# Patient Record
Sex: Female | Born: 1935 | Race: White | Hispanic: No | Smoking: Never smoker
Health system: Southern US, Community
[De-identification: ages and names within clinical notes are randomized; demographics above are authoritative.]

## PROBLEM LIST (undated history)

## (undated) DIAGNOSIS — G2581 Restless legs syndrome: Secondary | ICD-10-CM

## (undated) DIAGNOSIS — G20 Parkinson's disease: Secondary | ICD-10-CM

## (undated) DIAGNOSIS — K219 Gastro-esophageal reflux disease without esophagitis: Secondary | ICD-10-CM

## (undated) DIAGNOSIS — E785 Hyperlipidemia, unspecified: Secondary | ICD-10-CM

## (undated) DIAGNOSIS — G20A1 Parkinson's disease without dyskinesia, without mention of fluctuations: Secondary | ICD-10-CM

## (undated) DIAGNOSIS — M1990 Unspecified osteoarthritis, unspecified site: Secondary | ICD-10-CM

## (undated) HISTORY — PX: TONSILLECTOMY: SUR1361

## (undated) HISTORY — PX: INNER EAR SURGERY: SHX679

## (undated) HISTORY — DX: Restless legs syndrome: G25.81

## (undated) HISTORY — DX: Parkinson's disease: G20

## (undated) HISTORY — DX: Parkinson's disease without dyskinesia, without mention of fluctuations: G20.A1

## (undated) HISTORY — PX: JOINT REPLACEMENT: SHX530

## (undated) HISTORY — DX: Hyperlipidemia, unspecified: E78.5

## (undated) HISTORY — PX: OTHER SURGICAL HISTORY: SHX169

## (undated) HISTORY — PX: FL INJ RT KNEE CT ARTHROGRAM (ARMC HX): HXRAD1306

---

## 2005-07-04 ENCOUNTER — Ambulatory Visit: Payer: Self-pay | Admitting: Family Medicine

## 2006-07-06 ENCOUNTER — Ambulatory Visit: Payer: Self-pay | Admitting: Family Medicine

## 2007-03-29 ENCOUNTER — Ambulatory Visit: Payer: Self-pay | Admitting: Podiatry

## 2007-08-27 ENCOUNTER — Ambulatory Visit: Payer: Self-pay | Admitting: Family Medicine

## 2007-11-18 ENCOUNTER — Ambulatory Visit: Payer: Self-pay | Admitting: General Practice

## 2007-12-02 ENCOUNTER — Inpatient Hospital Stay: Payer: Self-pay | Admitting: General Practice

## 2008-08-04 LAB — HM PAP SMEAR

## 2008-08-28 ENCOUNTER — Ambulatory Visit: Payer: Self-pay | Admitting: Family Medicine

## 2009-04-06 ENCOUNTER — Emergency Department: Payer: Self-pay | Admitting: Emergency Medicine

## 2009-10-21 ENCOUNTER — Ambulatory Visit: Payer: Self-pay | Admitting: Family Medicine

## 2010-10-25 ENCOUNTER — Ambulatory Visit: Payer: Self-pay | Admitting: Family Medicine

## 2010-12-27 ENCOUNTER — Ambulatory Visit: Payer: Self-pay | Admitting: Family Medicine

## 2011-11-29 ENCOUNTER — Ambulatory Visit: Payer: Self-pay | Admitting: Family Medicine

## 2012-09-26 ENCOUNTER — Ambulatory Visit: Payer: Self-pay | Admitting: Unknown Physician Specialty

## 2012-09-26 LAB — HM COLONOSCOPY

## 2012-11-20 ENCOUNTER — Ambulatory Visit: Payer: Self-pay | Admitting: Family Medicine

## 2012-11-28 ENCOUNTER — Ambulatory Visit: Payer: Self-pay | Admitting: Family Medicine

## 2013-10-28 ENCOUNTER — Ambulatory Visit: Payer: Self-pay | Admitting: Family Medicine

## 2013-10-28 LAB — HM MAMMOGRAPHY

## 2014-01-29 LAB — HEPATIC FUNCTION PANEL
ALT: 30 U/L (ref 7–35)
AST: 33 U/L (ref 13–35)
Alkaline Phosphatase: 76 U/L (ref 25–125)
Bilirubin, Total: 0.7 mg/dL

## 2014-01-29 LAB — BASIC METABOLIC PANEL
BUN: 16 mg/dL (ref 4–21)
CREATININE: 0.8 mg/dL (ref 0.5–1.1)
Glucose: 92 mg/dL
Potassium: 5 mmol/L (ref 3.4–5.3)
Sodium: 141 mmol/L (ref 137–147)

## 2014-01-29 LAB — CBC AND DIFFERENTIAL
HCT: 43 % (ref 36–46)
HEMOGLOBIN: 14.1 g/dL (ref 12.0–16.0)
Neutrophils Absolute: 50 /uL
PLATELETS: 205 10*3/uL (ref 150–399)
WBC: 7.2 10^3/mL

## 2014-01-29 LAB — LIPID PANEL
Cholesterol: 201 mg/dL — AB (ref 0–200)
HDL: 47 mg/dL (ref 35–70)
LDL CALC: 119 mg/dL
LDL/HDL RATIO: 2.5
TRIGLYCERIDES: 176 mg/dL — AB (ref 40–160)

## 2014-01-29 LAB — TSH: TSH: 2.07 u[IU]/mL (ref 0.41–5.90)

## 2014-03-17 DIAGNOSIS — M069 Rheumatoid arthritis, unspecified: Secondary | ICD-10-CM | POA: Insufficient documentation

## 2014-03-17 DIAGNOSIS — M810 Age-related osteoporosis without current pathological fracture: Secondary | ICD-10-CM | POA: Insufficient documentation

## 2014-05-12 DIAGNOSIS — E559 Vitamin D deficiency, unspecified: Secondary | ICD-10-CM | POA: Insufficient documentation

## 2014-05-12 DIAGNOSIS — M6530 Trigger finger, unspecified finger: Secondary | ICD-10-CM | POA: Insufficient documentation

## 2014-11-16 DIAGNOSIS — Z85828 Personal history of other malignant neoplasm of skin: Secondary | ICD-10-CM | POA: Insufficient documentation

## 2014-11-18 ENCOUNTER — Ambulatory Visit: Payer: Self-pay | Admitting: Family Medicine

## 2015-04-30 DIAGNOSIS — R5383 Other fatigue: Secondary | ICD-10-CM

## 2015-04-30 DIAGNOSIS — F4320 Adjustment disorder, unspecified: Secondary | ICD-10-CM | POA: Insufficient documentation

## 2015-04-30 DIAGNOSIS — F329 Major depressive disorder, single episode, unspecified: Secondary | ICD-10-CM | POA: Insufficient documentation

## 2015-04-30 DIAGNOSIS — M069 Rheumatoid arthritis, unspecified: Secondary | ICD-10-CM | POA: Insufficient documentation

## 2015-04-30 DIAGNOSIS — F09 Unspecified mental disorder due to known physiological condition: Secondary | ICD-10-CM | POA: Insufficient documentation

## 2015-04-30 DIAGNOSIS — R413 Other amnesia: Secondary | ICD-10-CM | POA: Insufficient documentation

## 2015-04-30 DIAGNOSIS — M1990 Unspecified osteoarthritis, unspecified site: Secondary | ICD-10-CM | POA: Insufficient documentation

## 2015-04-30 DIAGNOSIS — R5381 Other malaise: Secondary | ICD-10-CM | POA: Insufficient documentation

## 2015-04-30 DIAGNOSIS — K219 Gastro-esophageal reflux disease without esophagitis: Secondary | ICD-10-CM | POA: Insufficient documentation

## 2015-04-30 DIAGNOSIS — M810 Age-related osteoporosis without current pathological fracture: Secondary | ICD-10-CM | POA: Insufficient documentation

## 2015-04-30 DIAGNOSIS — D649 Anemia, unspecified: Secondary | ICD-10-CM | POA: Insufficient documentation

## 2015-04-30 DIAGNOSIS — B351 Tinea unguium: Secondary | ICD-10-CM | POA: Insufficient documentation

## 2015-04-30 DIAGNOSIS — E785 Hyperlipidemia, unspecified: Secondary | ICD-10-CM | POA: Insufficient documentation

## 2015-06-08 ENCOUNTER — Other Ambulatory Visit: Payer: Self-pay

## 2015-06-08 DIAGNOSIS — E7800 Pure hypercholesterolemia, unspecified: Secondary | ICD-10-CM

## 2015-06-08 MED ORDER — LOVASTATIN 20 MG PO TABS: 20.0000 mg | ORAL_TABLET | Freq: Every day | ORAL | Status: DC

## 2015-06-10 ENCOUNTER — Encounter: Payer: Self-pay | Admitting: Family Medicine

## 2015-06-10 ENCOUNTER — Ambulatory Visit (INDEPENDENT_AMBULATORY_CARE_PROVIDER_SITE_OTHER): Payer: Medicare Other | Admitting: Family Medicine

## 2015-06-10 VITALS — BP 124/62 | HR 76 | Temp 97.6°F | Resp 14 | Ht 62.0 in | Wt 153.0 lb

## 2015-06-10 DIAGNOSIS — G629 Polyneuropathy, unspecified: Secondary | ICD-10-CM

## 2015-06-10 DIAGNOSIS — M069 Rheumatoid arthritis, unspecified: Secondary | ICD-10-CM | POA: Diagnosis not present

## 2015-06-10 DIAGNOSIS — F068 Other specified mental disorders due to known physiological condition: Secondary | ICD-10-CM

## 2015-06-10 DIAGNOSIS — F320 Major depressive disorder, single episode, mild: Secondary | ICD-10-CM | POA: Diagnosis not present

## 2015-06-10 DIAGNOSIS — E785 Hyperlipidemia, unspecified: Secondary | ICD-10-CM | POA: Diagnosis not present

## 2015-06-10 DIAGNOSIS — F09 Unspecified mental disorder due to known physiological condition: Secondary | ICD-10-CM

## 2015-06-10 MED ORDER — DONEPEZIL HCL 5 MG PO TABS: 5.0000 mg | ORAL_TABLET | Freq: Every day | ORAL | Status: DC

## 2015-06-10 NOTE — Progress Notes (Signed)
Patient ID: Morgan Dennis, female   DOB: February 29, 1934, 81 y.o.   MRN: [id number]    Subjective:  Hyperlipidemia This is a chronic problem. Pertinent negatives include no chest pain, myalgias or shortness of breath.   Depression Here for follow up. Patient states she is doing stable on Zoloft.  Memory impairment Here for follow up. She is taking Vayacog daily. She thinks it has helped overall. Forgetting what she came in the room for, names sometimes. She does not drive by herself.  Prior to Admission medications   Medication Sig Start Date End Date Taking? Authorizing Provider  aspirin 81 MG tablet Take 1 tablet by mouth daily.   Yes Historical Provider, MD  Cholecalciferol (VITAMIN D3) 2000 UNITS TABS Take 1 tablet by mouth daily.   Yes Historical Provider, MD  folic acid (FOLVITE) 408 MCG tablet Take 1 tablet by mouth daily. 06/12/11  Yes Historical Provider, MD  gabapentin (NEURONTIN) 300 MG capsule Take 1 capsule by mouth daily. 06/08/15  Yes Historical Provider, MD  Hydrogen Peroxide (PROXACOL EX) Apply topically See admin instructions. As directed   Yes Historical Provider, MD  inFLIXimab (REMICADE) 100 MG injection Inject into the vein See admin instructions. Every 6 weeks   Yes Historical Provider, MD  lovastatin (MEVACOR) 20 MG tablet Take 1 tablet (20 mg total) by mouth at bedtime. 06/08/15  Yes Richard Maceo Pro., MD  methotrexate 2.5 MG tablet Take 6 tablets by mouth See admin instructions. Every 6 weeks   Yes Historical Provider, MD  MULTIPLE VITAMIN PO Take 1 tablet by mouth daily. 06/12/11  Yes Historical Provider, MD  Omega 3 1000 MG CAPS Take 1 capsule by mouth daily. 06/12/11  Yes Historical Provider, MD  Phosphatidylserine-DHA-EPA 100-19.5-6.5 MG CAPS Take 1 tablet by mouth daily. 01/14/15  Yes Historical Provider, MD  sertraline (ZOLOFT) 50 MG tablet Take 1 tablet by mouth daily. 07/14/14  Yes Historical Provider, MD  Vitamin E 400 UNITS TABS Take 1 tablet by mouth daily.  12/30/12  Yes Historical Provider, MD    Patient Active Problem List   Diagnosis Date Noted  . Adaptation reaction 04/30/2015  . Absolute anemia 04/30/2015  . Acid reflux 04/30/2015  . HLD (hyperlipidemia) 04/30/2015  . Malaise and fatigue 04/30/2015  . Affective disorder, major 04/30/2015  . Bad memory 04/30/2015  . Mild cognitive disorder 04/30/2015  . Fungal infection of toenail 04/30/2015  . Arthritis, degenerative 04/30/2015  . OP (osteoporosis) 04/30/2015  . Arthritis or polyarthritis, rheumatoid 04/30/2015  . Avitaminosis D 05/12/2014    No past medical history on file.  History   Social History  . Marital Status: Married    Spouse Name: Percell Miller  . Number of Children: 1  . Years of Education: 12   Occupational History  . retired    Social History Main Topics  . Smoking status: Never Smoker   . Smokeless tobacco: Never Used  . Alcohol Use: No  . Drug Use: No  . Sexual Activity: No   Other Topics Concern  . Not on file   Social History Narrative    Allergies  Allergen Reactions  . Evista  [Raloxifene]     Other reaction(s): Joint Pains  . Ibandronic Acid     Other reaction(s): Muscle Pain  . Penicillins     Other reaction(s): Rash  . Risedronate Sodium     Other reaction(s): Joint Pains    Review of Systems  Constitutional: Negative for fever, chills, weight loss and diaphoresis.  Respiratory: Negative for cough, hemoptysis, sputum production and shortness of breath.   Cardiovascular: Negative for chest pain, palpitations, orthopnea and claudication.  Musculoskeletal: Positive for joint pain. Negative for myalgias, back pain and neck pain.  Neurological: Negative for dizziness, tingling, tremors, weakness and headaches.  Psychiatric/Behavioral: Negative for depression, suicidal ideas and hallucinations. The patient is not nervous/anxious and does not have insomnia.     Immunization History  Administered Date(s) Administered  . Pneumococcal  Polysaccharide-23 09/14/2008, 04/06/2009  . Td 05/03/2004, 04/06/2009  . Tdap 11/15/2011   Objective:  BP 124/62 mmHg  Pulse 76  Temp(Src) 97.6 F (36.4 C)  Resp 14  Ht 5\' 2"  (1.575 m)  Wt 153 lb (69.4 kg)  BMI 27.98 kg/m2  Physical Exam  Constitutional: She is oriented to person, place, and time and well-developed, well-nourished, and in no distress. No distress.  HENT:  Head: Normocephalic.  Eyes: Conjunctivae are normal. Pupils are equal, round, and reactive to light.  Neck: Normal range of motion. Neck supple.  Cardiovascular: Normal rate, regular rhythm, normal heart sounds and intact distal pulses.   No murmur heard. Pulmonary/Chest: Effort normal and breath sounds normal. She has no wheezes. She has no rales.  Musculoskeletal: Normal range of motion. She exhibits no edema or tenderness.  Neurological: She is alert and oriented to person, place, and time. Gait normal.  Psychiatric: Affect and judgment normal.    Lab Results  Component Value Date   WBC 7.2 01/29/2014   HGB 14.1 01/29/2014   HCT 43 01/29/2014   PLT 205 01/29/2014   CHOL 201* 01/29/2014   TRIG 176* 01/29/2014   HDL 47 01/29/2014   LDLCALC 119 01/29/2014   TSH 2.07 01/29/2014    CMP     Component Value Date/Time   NA 141 01/29/2014   K 5.0 01/29/2014   BUN 16 01/29/2014   CREATININE 0.8 01/29/2014   AST 33 01/29/2014   ALT 30 01/29/2014   ALKPHOS 76 01/29/2014    Assessment and Plan :  1. HLD (hyperlipidemia) Stable. Will check labs today.  2. Major depressive disorder, single episode, mild Stable. Follow.  3. Mild cognitive disorder This is progressing towards dementia. MMSE on next visit. Maybe slightly worse. Will add Donepezil and re check MMSE on the next visit.  4. Rheumatoid arthritis Follows rheumatologist for this issue.  5. Neuropathy Will check B12 levels today also plus routine labs.   Patient was seen and examined by Dr. Eulas Post and note was scribed by  Theressa Millard, RMA.  Miguel Aschoff MD [facility] 06/10/2015 2:03 PM

## 2015-06-12 LAB — LIPID PANEL WITH LDL/HDL RATIO
Cholesterol, Total: 195 mg/dL (ref 100–199)
HDL: 41 mg/dL (ref >39)
LDL Calculated: 116 mg/dL — ABNORMAL HIGH (ref 0–99)
LDL/HDL RATIO: 2.8 ratio (ref 0.0–3.2)
Triglycerides: 188 mg/dL — ABNORMAL HIGH (ref 0–149)
VLDL Cholesterol Cal: 38 mg/dL (ref 5–40)

## 2015-06-12 LAB — CBC WITH DIFFERENTIAL/PLATELET
Basophils Absolute: 0 10*3/uL (ref 0.0–0.2)
Basos: 1 %
EOS (ABSOLUTE): 0.3 10*3/uL (ref 0.0–0.4)
Eos: 5 %
HEMATOCRIT: 41.7 % (ref 34.0–46.6)
Hemoglobin: 13.7 g/dL (ref 11.1–15.9)
IMMATURE GRANULOCYTES: 0 %
Immature Grans (Abs): 0 10*3/uL (ref 0.0–0.1)
LYMPHS ABS: 2.1 10*3/uL (ref 0.7–3.1)
LYMPHS: 36 %
MCH: 31.6 pg (ref 26.6–33.0)
MCHC: 32.9 g/dL (ref 31.5–35.7)
MCV: 96 fL (ref 79–97)
MONOCYTES: 11 %
MONOS ABS: 0.6 10*3/uL (ref 0.1–0.9)
NEUTROS ABS: 2.8 10*3/uL (ref 1.4–7.0)
Neutrophils: 47 %
Platelets: 201 10*3/uL (ref 150–379)
RBC: 4.34 x10E6/uL (ref 3.77–5.28)
RDW: 13.5 % (ref 12.3–15.4)
WBC: 5.8 10*3/uL (ref 3.4–10.8)

## 2015-06-12 LAB — COMPREHENSIVE METABOLIC PANEL
ALT: 22 IU/L (ref 0–32)
AST: 32 IU/L (ref 0–40)
Albumin/Globulin Ratio: 1.4 (ref 1.1–2.5)
Albumin: 4.3 g/dL (ref 3.5–4.8)
Alkaline Phosphatase: 69 IU/L (ref 39–117)
BUN / CREAT RATIO: 23 (ref 11–26)
BUN: 16 mg/dL (ref 8–27)
Bilirubin Total: 0.6 mg/dL (ref 0.0–1.2)
CHLORIDE: 102 mmol/L (ref 97–108)
CO2: 26 mmol/L (ref 18–29)
Calcium: 9.4 mg/dL (ref 8.7–10.3)
Creatinine, Ser: 0.71 mg/dL (ref 0.57–1.00)
GFR calc Af Amer: 94 mL/min/{1.73_m2} (ref >59)
GFR calc non Af Amer: 82 mL/min/{1.73_m2} (ref >59)
GLOBULIN, TOTAL: 3 g/dL (ref 1.5–4.5)
Glucose: 96 mg/dL (ref 65–99)
Potassium: 4.6 mmol/L (ref 3.5–5.2)
Sodium: 142 mmol/L (ref 134–144)
TOTAL PROTEIN: 7.3 g/dL (ref 6.0–8.5)

## 2015-06-12 LAB — TSH: TSH: 4.18 u[IU]/mL (ref 0.450–4.500)

## 2015-06-12 LAB — VITAMIN B12: Vitamin B-12: 646 pg/mL (ref 211–946)

## 2015-06-22 NOTE — Progress Notes (Signed)
Patient advised  ED 

## 2015-07-26 ENCOUNTER — Encounter: Payer: Self-pay | Admitting: Family Medicine

## 2015-08-12 ENCOUNTER — Other Ambulatory Visit: Payer: Self-pay

## 2015-08-12 MED ORDER — SERTRALINE HCL 50 MG PO TABS: 50.0000 mg | ORAL_TABLET | Freq: Every day | ORAL | Status: DC

## 2015-08-18 ENCOUNTER — Inpatient Hospital Stay: Payer: Medicare Other

## 2015-08-18 ENCOUNTER — Encounter: Payer: Self-pay | Admitting: *Deleted

## 2015-08-18 ENCOUNTER — Inpatient Hospital Stay: Payer: Medicare Other | Admitting: Anesthesiology

## 2015-08-18 ENCOUNTER — Ambulatory Visit: Admit: 2015-08-18 | Payer: Self-pay | Admitting: Gastroenterology

## 2015-08-18 ENCOUNTER — Other Ambulatory Visit: Payer: Self-pay | Admitting: Emergency Medicine

## 2015-08-18 ENCOUNTER — Inpatient Hospital Stay
Admission: EM | Admit: 2015-08-18 | Discharge: 2015-08-19 | DRG: 384 | Disposition: A | Discharge status: Home / Self Care | Payer: Medicare Other | Attending: Internal Medicine | Admitting: Internal Medicine

## 2015-08-18 ENCOUNTER — Encounter
Admission: EM | Disposition: A | Discharge status: Home / Self Care | Payer: Self-pay | Source: Home / Self Care | Attending: Internal Medicine

## 2015-08-18 DIAGNOSIS — R579 Shock, unspecified: Secondary | ICD-10-CM | POA: Diagnosis present

## 2015-08-18 DIAGNOSIS — K920 Hematemesis: Secondary | ICD-10-CM | POA: Diagnosis present

## 2015-08-18 DIAGNOSIS — Z7982 Long term (current) use of aspirin: Secondary | ICD-10-CM

## 2015-08-18 DIAGNOSIS — K219 Gastro-esophageal reflux disease without esophagitis: Secondary | ICD-10-CM | POA: Diagnosis present

## 2015-08-18 DIAGNOSIS — Z79891 Long term (current) use of opiate analgesic: Secondary | ICD-10-CM

## 2015-08-18 DIAGNOSIS — Z791 Long term (current) use of non-steroidal anti-inflammatories (NSAID): Secondary | ICD-10-CM | POA: Diagnosis not present

## 2015-08-18 DIAGNOSIS — K259 Gastric ulcer, unspecified as acute or chronic, without hemorrhage or perforation: Principal | ICD-10-CM | POA: Diagnosis present

## 2015-08-18 DIAGNOSIS — K922 Gastrointestinal hemorrhage, unspecified: Secondary | ICD-10-CM | POA: Diagnosis present

## 2015-08-18 DIAGNOSIS — M1990 Unspecified osteoarthritis, unspecified site: Secondary | ICD-10-CM | POA: Diagnosis present

## 2015-08-18 DIAGNOSIS — Z888 Allergy status to other drugs, medicaments and biological substances status: Secondary | ICD-10-CM

## 2015-08-18 DIAGNOSIS — M069 Rheumatoid arthritis, unspecified: Secondary | ICD-10-CM | POA: Diagnosis present

## 2015-08-18 DIAGNOSIS — Z79899 Other long term (current) drug therapy: Secondary | ICD-10-CM | POA: Diagnosis not present

## 2015-08-18 DIAGNOSIS — F09 Unspecified mental disorder due to known physiological condition: Secondary | ICD-10-CM | POA: Diagnosis present

## 2015-08-18 DIAGNOSIS — D62 Acute posthemorrhagic anemia: Secondary | ICD-10-CM | POA: Diagnosis present

## 2015-08-18 DIAGNOSIS — D649 Anemia, unspecified: Secondary | ICD-10-CM

## 2015-08-18 DIAGNOSIS — E785 Hyperlipidemia, unspecified: Secondary | ICD-10-CM | POA: Diagnosis present

## 2015-08-18 DIAGNOSIS — R578 Other shock: Secondary | ICD-10-CM

## 2015-08-18 HISTORY — DX: Unspecified osteoarthritis, unspecified site: M19.90

## 2015-08-18 HISTORY — DX: Gastro-esophageal reflux disease without esophagitis: K21.9

## 2015-08-18 HISTORY — PX: ESOPHAGOGASTRODUODENOSCOPY (EGD) WITH PROPOFOL: SHX5813

## 2015-08-18 LAB — CBC
HCT: 27.9 % — ABNORMAL LOW (ref 35.0–47.0)
HCT: 27.7 % — ABNORMAL LOW (ref 35.0–47.0)
HEMATOCRIT: 29 % — AB (ref 35.0–47.0)
HEMOGLOBIN: 9.5 g/dL — AB (ref 12.0–16.0)
HEMOGLOBIN: 9.3 g/dL — AB (ref 12.0–16.0)
Hemoglobin: 9.1 g/dL — ABNORMAL LOW (ref 12.0–16.0)
MCH: 30.8 pg (ref 26.0–34.0)
MCH: 30.5 pg (ref 26.0–34.0)
MCH: 31.5 pg (ref 26.0–34.0)
MCHC: 32.8 g/dL (ref 32.0–36.0)
MCHC: 32.6 g/dL (ref 32.0–36.0)
MCHC: 33.5 g/dL (ref 32.0–36.0)
MCV: 94 fL (ref 80.0–100.0)
MCV: 93.4 fL (ref 80.0–100.0)
MCV: 93.9 fL (ref 80.0–100.0)
PLATELETS: 109 10*3/uL — AB (ref 150–440)
PLATELETS: 106 10*3/uL — AB (ref 150–440)
Platelets: 110 10*3/uL — ABNORMAL LOW (ref 150–440)
RBC: 3.09 MIL/uL — ABNORMAL LOW (ref 3.80–5.20)
RBC: 2.99 MIL/uL — AB (ref 3.80–5.20)
RBC: 2.95 MIL/uL — AB (ref 3.80–5.20)
RDW: 14.4 % (ref 11.5–14.5)
RDW: 14.3 % (ref 11.5–14.5)
RDW: 14.9 % — ABNORMAL HIGH (ref 11.5–14.5)
WBC: 13.9 10*3/uL — AB (ref 3.6–11.0)
WBC: 11.2 10*3/uL — AB (ref 3.6–11.0)
WBC: 10.8 10*3/uL (ref 3.6–11.0)

## 2015-08-18 LAB — PREPARE RBC (CROSSMATCH)

## 2015-08-18 LAB — CBC WITH DIFFERENTIAL/PLATELET
BASOS PCT: 0 %
Basophils Absolute: 0 10*3/uL (ref 0–0.1)
EOS ABS: 0.1 10*3/uL (ref 0–0.7)
Eosinophils Relative: 1 %
HEMATOCRIT: 26 % — AB (ref 35.0–47.0)
HEMOGLOBIN: 8.5 g/dL — AB (ref 12.0–16.0)
Lymphocytes Relative: 35 %
Lymphs Abs: 3.7 10*3/uL — ABNORMAL HIGH (ref 1.0–3.6)
MCH: 31.4 pg (ref 26.0–34.0)
MCHC: 32.7 g/dL (ref 32.0–36.0)
MCV: 96.2 fL (ref 80.0–100.0)
Monocytes Absolute: 1.1 10*3/uL — ABNORMAL HIGH (ref 0.2–0.9)
Monocytes Relative: 10 %
NEUTROS ABS: 5.7 10*3/uL (ref 1.4–6.5)
NEUTROS PCT: 54 %
Platelets: 153 10*3/uL (ref 150–440)
RBC: 2.7 MIL/uL — AB (ref 3.80–5.20)
RDW: 13.6 % (ref 11.5–14.5)
WBC: 10.6 10*3/uL (ref 3.6–11.0)

## 2015-08-18 LAB — COMPREHENSIVE METABOLIC PANEL
ALK PHOS: 46 U/L (ref 38–126)
ALT: 14 U/L (ref 14–54)
AST: 26 U/L (ref 15–41)
Albumin: 3 g/dL — ABNORMAL LOW (ref 3.5–5.0)
Anion gap: 4 — ABNORMAL LOW (ref 5–15)
BUN: 52 mg/dL — ABNORMAL HIGH (ref 6–20)
CALCIUM: 8.4 mg/dL — AB (ref 8.9–10.3)
CO2: 27 mmol/L (ref 22–32)
CREATININE: 0.63 mg/dL (ref 0.44–1.00)
Chloride: 110 mmol/L (ref 101–111)
Glucose, Bld: 154 mg/dL — ABNORMAL HIGH (ref 65–99)
Potassium: 3.4 mmol/L — ABNORMAL LOW (ref 3.5–5.1)
Sodium: 141 mmol/L (ref 135–145)
Total Bilirubin: 0.4 mg/dL (ref 0.3–1.2)
Total Protein: 5.5 g/dL — ABNORMAL LOW (ref 6.5–8.1)

## 2015-08-18 LAB — BASIC METABOLIC PANEL
Anion gap: 2 — ABNORMAL LOW (ref 5–15)
BUN: 36 mg/dL — AB (ref 6–20)
CALCIUM: 7.5 mg/dL — AB (ref 8.9–10.3)
CO2: 24 mmol/L (ref 22–32)
CREATININE: 0.48 mg/dL (ref 0.44–1.00)
Chloride: 116 mmol/L — ABNORMAL HIGH (ref 101–111)
GFR calc non Af Amer: >60 mL/min (ref >60)
GLUCOSE: 92 mg/dL (ref 65–99)
Potassium: 3.9 mmol/L (ref 3.5–5.1)
Sodium: 142 mmol/L (ref 135–145)

## 2015-08-18 LAB — TROPONIN I
TROPONIN I: 0.03 ng/mL (ref <0.031)
Troponin I: 0.04 ng/mL — ABNORMAL HIGH (ref <0.031)

## 2015-08-18 SURGERY — ESOPHAGOGASTRODUODENOSCOPY (EGD) WITH PROPOFOL
Anesthesia: General

## 2015-08-18 MED ORDER — DONEPEZIL HCL 5 MG PO TABS
5.0000 mg | ORAL_TABLET | Freq: Every day | ORAL | Status: DC
  Administered 2015-08-18: 5 mg via ORAL
  Filled 2015-08-18: qty 1

## 2015-08-18 MED ORDER — CHLORHEXIDINE GLUCONATE 0.12 % MT SOLN
15.0000 mL | Freq: Two times a day (BID) | OROMUCOSAL | Status: DC
Start: 2015-08-18 — End: 2015-08-19
  Administered 2015-08-18 – 2015-08-19 (×3): 15 mL via OROMUCOSAL
  Filled 2015-08-18 (×4): qty 15

## 2015-08-18 MED ORDER — PROPOFOL INFUSION 10 MG/ML OPTIME
INTRAVENOUS | Status: DC | PRN
  Administered 2015-08-18: 120 ug/kg/min via INTRAVENOUS

## 2015-08-18 MED ORDER — ONDANSETRON HCL 4 MG PO TABS: 4.0000 mg | ORAL_TABLET | Freq: Four times a day (QID) | ORAL | Status: DC | PRN

## 2015-08-18 MED ORDER — ACETAMINOPHEN 650 MG RE SUPP: 650.0000 mg | Freq: Four times a day (QID) | RECTAL | Status: DC | PRN

## 2015-08-18 MED ORDER — MORPHINE SULFATE (PF) 2 MG/ML IV SOLN: 2.0000 mg | INTRAVENOUS | Status: DC | PRN

## 2015-08-18 MED ORDER — VITAMIN E 180 MG (400 UNIT) PO CAPS
400.0000 [IU] | ORAL_CAPSULE | Freq: Every day | ORAL | Status: DC
  Administered 2015-08-18 – 2015-08-19 (×2): 400 [IU] via ORAL
  Filled 2015-08-18 (×2): qty 1

## 2015-08-18 MED ORDER — SODIUM CHLORIDE 0.9 % IV SOLN
Freq: Once | INTRAVENOUS | Status: AC
  Administered 2015-08-18: 01:00:00 via INTRAVENOUS

## 2015-08-18 MED ORDER — FOLIC ACID 1 MG PO TABS
1.0000 mg | ORAL_TABLET | Freq: Every day | ORAL | Status: DC
  Administered 2015-08-18 – 2015-08-19 (×2): 1 mg via ORAL
  Filled 2015-08-18 (×2): qty 1

## 2015-08-18 MED ORDER — PANTOPRAZOLE SODIUM 40 MG IV SOLR: 40.0000 mg | Freq: Two times a day (BID) | INTRAVENOUS | Status: DC

## 2015-08-18 MED ORDER — PROPOFOL 10 MG/ML IV BOLUS
INTRAVENOUS | Status: DC | PRN
  Administered 2015-08-18: 50 mg via INTRAVENOUS

## 2015-08-18 MED ORDER — VITAMIN C 500 MG PO TABS
1000.0000 mg | ORAL_TABLET | Freq: Every day | ORAL | Status: DC
  Administered 2015-08-18 – 2015-08-19 (×2): 1000 mg via ORAL
  Filled 2015-08-18 (×2): qty 2

## 2015-08-18 MED ORDER — OXYCODONE HCL 5 MG PO TABS: 5.0000 mg | ORAL_TABLET | ORAL | Status: DC | PRN

## 2015-08-18 MED ORDER — LIDOCAINE HCL (CARDIAC) 20 MG/ML IV SOLN
INTRAVENOUS | Status: DC | PRN
  Administered 2015-08-18: 50 mg via INTRAVENOUS

## 2015-08-18 MED ORDER — IOHEXOL 300 MG/ML  SOLN
100.0000 mL | Freq: Once | INTRAMUSCULAR | Status: AC | PRN
  Administered 2015-08-18: 100 mL via INTRAVENOUS

## 2015-08-18 MED ORDER — SERTRALINE HCL 50 MG PO TABS
50.0000 mg | ORAL_TABLET | Freq: Every day | ORAL | Status: DC
  Administered 2015-08-18 – 2015-08-19 (×2): 50 mg via ORAL
  Filled 2015-08-18 (×2): qty 1

## 2015-08-18 MED ORDER — GABAPENTIN 300 MG PO CAPS
300.0000 mg | ORAL_CAPSULE | Freq: Three times a day (TID) | ORAL | Status: DC
  Administered 2015-08-18 – 2015-08-19 (×4): 300 mg via ORAL
  Filled 2015-08-18 (×4): qty 1

## 2015-08-18 MED ORDER — ONDANSETRON HCL 4 MG/2ML IJ SOLN: 4.0000 mg | Freq: Four times a day (QID) | INTRAMUSCULAR | Status: DC | PRN

## 2015-08-18 MED ORDER — ACETAMINOPHEN 325 MG PO TABS: 650.0000 mg | ORAL_TABLET | Freq: Four times a day (QID) | ORAL | Status: DC | PRN

## 2015-08-18 MED ORDER — IOHEXOL 240 MG/ML SOLN
25.0000 mL | Freq: Once | INTRAMUSCULAR | Status: AC | PRN
  Administered 2015-08-18: 25 mL via ORAL

## 2015-08-18 MED ORDER — ONDANSETRON HCL 4 MG/2ML IJ SOLN: 4.0000 mg | Freq: Once | INTRAMUSCULAR | Status: DC | PRN

## 2015-08-18 MED ORDER — VITAMIN D 1000 UNITS PO TABS
2000.0000 [IU] | ORAL_TABLET | Freq: Every day | ORAL | Status: DC
  Administered 2015-08-18 – 2015-08-19 (×2): 2000 [IU] via ORAL
  Filled 2015-08-18 (×2): qty 2

## 2015-08-18 MED ORDER — SODIUM CHLORIDE 0.9 % IV SOLN
8.0000 mg/h | INTRAVENOUS | Status: DC
  Administered 2015-08-18 – 2015-08-19 (×3): 8 mg/h via INTRAVENOUS
  Filled 2015-08-18 (×3): qty 80

## 2015-08-18 MED ORDER — SODIUM CHLORIDE 0.9 % IV SOLN: INTRAVENOUS | Status: DC

## 2015-08-18 MED ORDER — SODIUM CHLORIDE 0.9 % IV SOLN
80.0000 mg | Freq: Once | INTRAVENOUS | Status: AC
  Administered 2015-08-18: 80 mg via INTRAVENOUS
  Filled 2015-08-18: qty 80

## 2015-08-18 MED ORDER — CETYLPYRIDINIUM CHLORIDE 0.05 % MT LIQD
7.0000 mL | Freq: Two times a day (BID) | OROMUCOSAL | Status: DC
  Administered 2015-08-18: 7 mL via OROMUCOSAL

## 2015-08-18 MED ORDER — SODIUM CHLORIDE 0.9 % IV SOLN
INTRAVENOUS | Status: DC
  Administered 2015-08-18 (×2): via INTRAVENOUS

## 2015-08-18 MED ORDER — ONDANSETRON HCL 4 MG/2ML IJ SOLN
INTRAMUSCULAR | Status: AC
  Administered 2015-08-18: 4 mg
  Filled 2015-08-18: qty 2

## 2015-08-18 MED ORDER — SODIUM CHLORIDE 0.9 % IV SOLN
10.0000 mL/h | Freq: Once | INTRAVENOUS | Status: AC
  Administered 2015-08-18: 14:00:00 via INTRAVENOUS

## 2015-08-18 MED ORDER — FENTANYL CITRATE (PF) 100 MCG/2ML IJ SOLN: 25.0000 ug | INTRAMUSCULAR | Status: DC | PRN

## 2015-08-18 MED ORDER — CALCIUM CARBONATE-VITAMIN D 500-200 MG-UNIT PO TABS
1.0000 | ORAL_TABLET | Freq: Two times a day (BID) | ORAL | Status: DC
Start: 2015-08-18 — End: 2015-08-19
  Administered 2015-08-18 – 2015-08-19 (×3): 1 via ORAL
  Filled 2015-08-18 (×3): qty 1

## 2015-08-18 MED ORDER — PRAVASTATIN SODIUM 20 MG PO TABS
20.0000 mg | ORAL_TABLET | Freq: Every day | ORAL | Status: DC
  Administered 2015-08-18: 20 mg via ORAL
  Filled 2015-08-18: qty 1

## 2015-08-18 NOTE — ED Notes (Signed)
Pt unable to finish entire contrast bottle at this time, MD Owens Shark made aware and verbalized okay. MD brown verbalized to send pt to CT at this time and start blood once returns. Pt and family made aware, verbalized understanding at this time. CT called, will come to get pt at this time.

## 2015-08-18 NOTE — ED Notes (Signed)
Pt given oral contrast at this time, instructed to sip slowly due to n/v. Pt tolerating well. Will let CT know once pt completes contrast.

## 2015-08-18 NOTE — Progress Notes (Signed)
Patient arrived to unit around 3:30am. Patient alert and oriented. Family present at bedside. Patient received 2 units of blood and verifications were confirmed with second RN. Patient tolerated blood well. Last unit of blood to be completed at 0754.

## 2015-08-18 NOTE — Op Note (Signed)
[facility] Gastroenterology Patient Name: Morgan Dennis Procedure Date: 08/18/2015 2:13 PM MRN: [id number] Account #: [id number] Date of Birth: Nov 07, 1934 Admit Type: Inpatient Age: 81 Room: [facility] ENDO ROOM 4 Gender: Female Note Status: Finalized Procedure:         Upper GI endoscopy Indications:       Acute post hemorrhagic anemia, Coffee-ground emesis,                     Hematemesis, chronic NSAIDS use Providers:         Lupita Dawn. Candace Cruise, MD Referring MD:      Janine Ores. Rosanna Randy, MD (Referring MD) Medicines:         Monitored Anesthesia Care Complications:     No immediate complications. Procedure:         Pre-Anesthesia Assessment:                    - Prior to the procedure, a History and Physical was                     performed, and patient medications, allergies and                     sensitivities were reviewed. The patient's tolerance of                     previous anesthesia was reviewed.                    - The risks and benefits of the procedure and the sedation                     options and risks were discussed with the patient. All                     questions were answered and informed consent was obtained.                    - After reviewing the risks and benefits, the patient was                     deemed in satisfactory condition to undergo the procedure.                    After obtaining informed consent, the endoscope was passed                     under direct vision. Throughout the procedure, the                     patient's blood pressure, pulse, and oxygen saturations                     were monitored continuously. The Endoscope was introduced                     through the mouth, and advanced to the second part of                     duodenum. The upper GI endoscopy was accomplished without                     difficulty. The patient tolerated the procedure well. Findings:      The examined esophagus was normal.  One  non-bleeding cratered gastric ulcer was found in the gastric antrum.       Base is clean now. Biopsies were taken with a cold forceps for       Helicobacter pylori testing.      Localized moderate inflammation characterized by erosions and erythema       was found in the gastric antrum.      The exam was otherwise without abnormality.      The examined duodenum was normal. Impression:        - Normal esophagus.                    - Gastric ulcer with clean base. Biopsied.                    - Acute gastritis.                    - The examination was otherwise normal.                    - Normal examined duodenum. Recommendation:    - Observe patient's clinical course.                    - Await pathology results.                    - Continue present medications.                    - The findings and recommendations were discussed with the                     patient. Procedure Code(s): --- Professional ---                    [phone], Esophagogastroduodenoscopy, flexible, transoral;                     with biopsy, single or multiple Diagnosis Code(s): --- Professional ---                    K25.9, Gastric ulcer, unspecified as acute or chronic,                     without hemorrhage or perforation                    K29.00, Acute gastritis without bleeding                    D62, Acute posthemorrhagic anemia                    K92.0, Hematemesis CPT copyright 2014 American Medical Association. All rights reserved. The codes documented in this report are preliminary and upon coder review may  be revised to meet current compliance requirements. Hulen Luster, MD 08/18/2015 2:23:46 PM This report has been signed electronically. Number of Addenda: 0 Note Initiated On: 08/18/2015 2:13 PM      [facility]

## 2015-08-18 NOTE — Op Note (Signed)
Please see D. Martin's notes. Chronic NSAIDS use. EGD showed gastric ulcer with clean base. Some gastritis. Bx taken for H.pylori though ulcer likely from NSAIDS. Full liquid diet ordered. Advance diet as tolerated. Continue PPI BID. Avoid NSAIDS.  Thanks.

## 2015-08-18 NOTE — ED Notes (Signed)
Pt will be transported to floor once returns from CT. Family made aware and verbalized understanding at this time.

## 2015-08-18 NOTE — Anesthesia Postprocedure Evaluation (Signed)
  Anesthesia Post-op Note  Patient: Morgan Dennis  Procedure(s) Performed: Procedure(s): ESOPHAGOGASTRODUODENOSCOPY (EGD) WITH PROPOFOL (N/A)  Anesthesia type:General  Patient location: PACU  Post pain: Pain level controlled  Post assessment: Post-op Vital signs reviewed, Patient's Cardiovascular Status Stable, Respiratory Function Stable, Patent Airway and No signs of Nausea or vomiting  Post vital signs: Reviewed and stable  Last Vitals:  Filed Vitals:   08/18/15 1456  BP: 131/59  Pulse: 73  Temp:   Resp: 19    Level of consciousness: awake, alert  and patient cooperative  Complications: No apparent anesthesia complications

## 2015-08-18 NOTE — Anesthesia Preprocedure Evaluation (Addendum)
Anesthesia Evaluation  Patient identified by MRN, date of birth, ID band Patient awake    Reviewed: Allergy & Precautions, NPO status , Patient's Chart, lab work & pertinent test results  Airway Mallampati: II       Dental no notable dental hx.    Pulmonary neg pulmonary ROS,  breath sounds clear to auscultation  Pulmonary exam normal       Cardiovascular Normal cardiovascular exam    Neuro/Psych Depression negative neurological ROS     GI/Hepatic Neg liver ROS, GERD-  Medicated and Controlled,Upper GI bleed   Endo/Other  negative endocrine ROS  Renal/GU negative Renal ROS  negative genitourinary   Musculoskeletal  (+) Arthritis -, Osteoarthritis,    Abdominal Normal abdominal exam  (+)   Peds negative pediatric ROS (+)  Hematology  (+) anemia ,   Anesthesia Other Findings   Reproductive/Obstetrics                            Anesthesia Physical Anesthesia Plan  ASA: III  Anesthesia Plan: General   Post-op Pain Management:    Induction: Intravenous  Airway Management Planned: Nasal Cannula  Additional Equipment:   Intra-op Plan:   Post-operative Plan:   Informed Consent: I have reviewed the patients History and Physical, chart, labs and discussed the procedure including the risks, benefits and alternatives for the proposed anesthesia with the patient or authorized representative who has indicated his/her understanding and acceptance.   Dental advisory given  Plan Discussed with: CRNA and Surgeon  Anesthesia Plan Comments:         Anesthesia Quick Evaluation

## 2015-08-18 NOTE — Progress Notes (Signed)
[facility] [place] NAME: Morgan Dennis    MR#:  [id number]  DATE OF BIRTH:  [birth year]  SUBJECTIVE:  CHIEF COMPLAINT:   Chief Complaint  Patient presents with  . Nausea  . Vomiting  . Loss of Consciousness   Comfortable. No complaints. No additional vomiting.  REVIEW OF SYSTEMS:   Review of Systems  Constitutional: Negative for fever.  Respiratory: Negative for shortness of breath.   Cardiovascular: Negative for chest pain and palpitations.  Gastrointestinal: Positive for nausea, vomiting, abdominal pain and diarrhea.  Genitourinary: Negative for dysuria.    DRUG ALLERGIES:   Allergies  Allergen Reactions  . Evista  [Raloxifene]     Other reaction(s): Joint Pains  . Ibandronic Acid     Other reaction(s): Muscle Pain  . Penicillins     Other reaction(s): Rash  . Risedronate Sodium     Other reaction(s): Joint Pains    VITALS:  Blood pressure 123/56, pulse 75, temperature 98 F (36.7 C), temperature source Oral, resp. rate 18, height 5\' 4"  (1.626 m), weight 69.627 kg (153 lb 8 oz), SpO2 93 %.  PHYSICAL EXAMINATION:  GENERAL:  81 y.o.-year-old patient lying in the bed with no acute distress.  LUNGS: Normal breath sounds bilaterally, no wheezing, rales,rhonchi or crepitation. No use of accessory muscles of respiration.  CARDIOVASCULAR: S1, S2 normal. No murmurs, rubs, or gallops.  ABDOMEN: Soft, nontender, nondistended. Bowel sounds present. No organomegaly or mass.  EXTREMITIES: No pedal edema, cyanosis, or clubbing. There is pain with movement of the left shoulder with tenderness to palpation NEUROLOGIC: Cranial nerves II through XII are intact. Muscle strength 5/5 in all extremities. Sensation intact. Gait not checked.  PSYCHIATRIC: The patient is alert and oriented x 3.  SKIN: No obvious rash, lesion, or ulcer.    LABORATORY PANEL:   CBC  Recent Labs Lab 08/18/15 1541  WBC 11.2*  HGB 9.1*  HCT 27.9*   PLT 109*   ------------------------------------------------------------------------------------------------------------------  Chemistries   Recent Labs Lab 08/18/15 0027 08/18/15 0853  NA 141 142  K 3.4* 3.9  CL 110 116*  CO2 27 24  GLUCOSE 154* 92  BUN 52* 36*  CREATININE 0.63 0.48  CALCIUM 8.4* 7.5*  AST 26  --   ALT 14  --   ALKPHOS 46  --   BILITOT 0.4  --    ------------------------------------------------------------------------------------------------------------------  Cardiac Enzymes  Recent Labs Lab 08/18/15 0854  TROPONINI 0.04*   ------------------------------------------------------------------------------------------------------------------  RADIOLOGY:  Ct Abdomen Pelvis W Contrast  08/18/2015   CLINICAL DATA:  Hematemesis.  EXAM: CT ABDOMEN AND PELVIS WITH CONTRAST  TECHNIQUE: Multidetector CT imaging of the abdomen and pelvis was performed using the standard protocol following bolus administration of intravenous contrast.  CONTRAST:  125mL OMNIPAQUE IOHEXOL 300 MG/ML  SOLN  COMPARISON:  None.  FINDINGS: There is a large volume of hyperdense liquid within the gastric lumen. This could represent blood. There are normal appearances of the liver, gallbladder, pancreas, spleen, adrenals and kidneys except for a few incidental splenic granulomata. Small bowel appears unremarkable. There is extensive colonic diverticulosis.  The abdominal aorta is normal in caliber. There is no atherosclerotic calcification. There is no adenopathy in the abdomen or pelvis. The uterus and ovaries are remarkable only for a midline posterior uterine fibroid measuring less than 2 cm.  No acute inflammatory changes evident in the abdomen or pelvis. Mild atelectatic appearing posterior base opacities are present in both lungs. No significant skeletal  lesion is evident. There is a benign vertebral hemangioma at T11.  A small fat containing umbilical hernia is incidentally noted.  IMPRESSION:  1. Large volume of hyperdense liquid within the gastric lumen, possibly blood. 2. Diverticulosis 3. Small midline posterior uterine fibroid. 4. Small fat containing umbilical hernia. 5. Incidentally noted benign vertebral hemangioma at T11   Electronically Signed   By: Andreas Newport M.D.   On: 08/18/2015 03:47    EKG:   Orders placed or performed during the hospital encounter of 08/18/15  . EKG 12-Lead  . EKG 12-Lead    ASSESSMENT AND PLAN:   #1 coffee-ground emesis - She reports daily nonstress anti-inflammatory years due to joint pain - Gastro-enterology consult, will likely have EGD today - Blood pressure and hemoglobin stable at this time - No further nausea or vomiting, no hematochezia though she does report she has had diarrhea for the past several weeks - Continue Protonix infusion  #2 syncope - Likely due to the above - No EKG changes, no events on telemetry, troponins stable  #3 dementia - Stable, at baseline, continue Aricept and sertraline  All the records are reviewed and case discussed with Care Management/Social Workerr. Management plans discussed with the patient, husband and both sons CODE STATUS: Full  TOTAL TIME TAKING CARE OF THIS PATIENT: 35 minutes.  Greater than 50% of time spent in care coordination and counseling. POSSIBLE D/C IN 1-2 DAYS, DEPENDING ON CLINICAL CONDITION.   Myrtis Ser M.D on 08/18/2015 at 4:26 PM  Between 7am to 6pm - Pager - [phone]  After 6pm go to [email or web address] - password [email or web address] [facility]  Office  [phone]  CC: Primary care physician; [name], MD

## 2015-08-18 NOTE — Transfer of Care (Signed)
Immediate Anesthesia Transfer of Care Note  Patient: Morgan Dennis  Procedure(s) Performed: Procedure(s): ESOPHAGOGASTRODUODENOSCOPY (EGD) WITH PROPOFOL (N/A)  Patient Location: Endoscopy Unit  Anesthesia Type:General  Level of Consciousness: awake  Airway & Oxygen Therapy: Patient Spontanous Breathing and Patient connected to nasal cannula oxygen  Post-op Assessment: Report given to RN  Post vital signs: Reviewed  Last Vitals:  Filed Vitals:   08/18/15 1426  BP: 131/67  Pulse: 83  Temp: 36.2 C  Resp: 22    Complications: No apparent anesthesia complications

## 2015-08-18 NOTE — ED Notes (Signed)
Pt to CT at this time.
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
## 2015-08-18 NOTE — ED Notes (Addendum)
MD Brown at bedside.

## 2015-08-18 NOTE — ED Notes (Signed)
Pt to ED with nausea and vomiting since yesterday. Today pt began to vomit blood. En route to ED pt experienced syncopal episode. Pt given 400 cc bolus of fluids and 4 mg of zofran per EMS. Pt is AAOx3 on arrival, pale and hypotensive at 98/58. MD Owens Shark at bedside at time of arrival.

## 2015-08-19 ENCOUNTER — Telehealth: Payer: Self-pay | Admitting: Family Medicine

## 2015-08-19 LAB — BASIC METABOLIC PANEL
Anion gap: 3 — ABNORMAL LOW (ref 5–15)
BUN: 17 mg/dL (ref 6–20)
CO2: 25 mmol/L (ref 22–32)
Calcium: 8.1 mg/dL — ABNORMAL LOW (ref 8.9–10.3)
Chloride: 115 mmol/L — ABNORMAL HIGH (ref 101–111)
Creatinine, Ser: 0.66 mg/dL (ref 0.44–1.00)
GFR calc Af Amer: >60 mL/min (ref >60)
GFR calc non Af Amer: >60 mL/min (ref >60)
Glucose, Bld: 92 mg/dL (ref 65–99)
Potassium: 4.1 mmol/L (ref 3.5–5.1)
Sodium: 143 mmol/L (ref 135–145)

## 2015-08-19 LAB — HEMOGLOBIN: Hemoglobin: 9.4 g/dL — ABNORMAL LOW (ref 12.0–16.0)

## 2015-08-19 MED ORDER — ACETAMINOPHEN 325 MG PO TABS: 650.0000 mg | ORAL_TABLET | Freq: Four times a day (QID) | ORAL | Status: DC | PRN

## 2015-08-19 MED ORDER — PANTOPRAZOLE SODIUM 40 MG PO TBEC: 40.0000 mg | DELAYED_RELEASE_TABLET | Freq: Two times a day (BID) | ORAL | Status: DC

## 2015-08-19 NOTE — Telephone Encounter (Signed)
[place] called to get a hospital f/u. Pt is being discharged today and was treated for upper GI bleed. Pt is scheduled for 08/26/15 @ 230 pm. Thanks TNP

## 2015-08-19 NOTE — Discharge Instructions (Addendum)
Gastritis, Adult Gastritis is soreness and puffiness (inflammation) of the lining of the stomach. If you do not get help, gastritis can cause bleeding and sores (ulcers) in the stomach. HOME CARE   Only take medicine as told by your doctor.  If you were given antibiotic medicines, take them as told. Finish the medicines even if you start to feel better.  Drink enough fluids to keep your pee (urine) clear or pale yellow.  Avoid foods and drinks that make your problems worse. Foods you may want to avoid include:  Caffeine or alcohol.  Chocolate.  Mint.  Garlic and onions.  Spicy foods.  Citrus fruits, including oranges, lemons, or limes.  Food containing tomatoes, including sauce, chili, salsa, and pizza.  Fried and fatty foods.  Eat small meals throughout the day instead of large meals. GET HELP RIGHT AWAY IF:   You have black or dark red poop (stools).  You throw up (vomit) blood. It may look like coffee grounds.  You cannot keep fluids down.  Your belly (abdominal) pain gets worse.  You have a fever.  You do not feel better after 1 week.  You have any other questions or concerns. MAKE SURE YOU:   Understand these instructions.  Will watch your condition.  Will get help right away if you are not doing well or get worse. Document Released: 05/22/2008 Document Revised: 02/26/2012 Document Reviewed: 01/17/2012 [facility] Patient Information 2015 [place], [place]. This information is not intended to replace advice given to you by your health care provider. Make sure you discuss any questions you have with your health care provider.  Gastrointestinal Bleeding Gastrointestinal bleeding is bleeding somewhere along the path that food travels through the body (digestive tract). This path is anywhere between the mouth and the opening of the butt (anus). You may have blood in your throw up (vomit) or in your poop (stools). If there is a lot of bleeding, you may need to stay  in the hospital. [place]  Only take medicine as told by your doctor.  Eat foods with fiber such as whole grains, fruits, and vegetables. You can also try eating 1 to 3 prunes a day.  Drink enough fluids to keep your pee (urine) clear or pale yellow. GET HELP RIGHT AWAY IF:   Your bleeding gets worse.  You feel dizzy, weak, or you pass out (faint).  You have bad cramps in your back or belly (abdomen).  You have large blood clumps (clots) in your poop.  Your problems are getting worse. MAKE SURE YOU:   Understand these instructions.  Will watch your condition.  Will get help right away if you are not doing well or get worse. Document Released: 09/12/2008 Document Revised: 11/20/2012 Document Reviewed: 11/13/2011 [facility] Patient Information 2015 [place], [place]. This information is not intended to replace advice given to you by your health care provider. Make sure you discuss any questions you have with your health care provider.

## 2015-08-19 NOTE — Progress Notes (Signed)
Iv and tele were removed. Pt and husband were given discharge instructions, follow-up appointments, and prescription for tylenol. All questions were answered. Pt then got dressed and awaited NT to bring wheelchair to be escorted downstairs.

## 2015-08-19 NOTE — Discharge Summary (Signed)
[place] [facility] [place] NAME: Morgan Dennis    MR#:  [id number]  DATE OF BIRTH:  July 15, 1934  DATE OF ADMISSION:  08/18/2015 ADMITTING PHYSICIAN: Lytle Butte, MD  DATE OF DISCHARGE: 08/19/2015  PRIMARY CARE PHYSICIAN: [name], MD    ADMISSION DIAGNOSIS:  Hemorrhagic shock [R57.9] Upper GI bleed [K92.2] Anemia, unspecified anemia type [D64.9]  DISCHARGE DIAGNOSIS:  Principal Problem:   Upper GI bleed   SECONDARY DIAGNOSIS:   Past Medical History  Diagnosis Date  . Arthritis   . GERD (gastroesophageal reflux disease)     HOSPITAL COURSE:   1. Upper GI bleed. Patient was found to have a gastric ulcer on endoscopy. Patient was advised not to take any anti-inflammatory such as Aleve and aspirin. Prescribed Protonix drip while here in the hospital and Protonix orally twice a day upon discharge. Follow-up with GI Dr. Candace Cruise as outpatient in a couple weeks. 2. Rheumatoid arthritis- advised not to take aspirin and Aleve. Patient can take Tylenol as needed for pain. Can continue methotrexate and Remicade. 3. Acute blood loss anemia- hemoglobin stabilized at 9.4. 4. Mild cognitive disorder on Aricept.  DISCHARGE CONDITIONS:   Satisfactory  CONSULTS OBTAINED:  Treatment Team:  Hulen Luster, MD  DRUG ALLERGIES:   Allergies  Allergen Reactions  . Evista  [Raloxifene]     Other reaction(s): Joint Pains  . Ibandronic Acid     Other reaction(s): Muscle Pain  . Penicillins     Other reaction(s): Rash  . Risedronate Sodium     Other reaction(s): Joint Pains    DISCHARGE MEDICATIONS:   Current Discharge Medication List    START taking these medications   Details  acetaminophen (TYLENOL) 325 MG tablet Take 2 tablets (650 mg total) by mouth every 6 (six) hours as needed for mild pain (or Fever >/= 101).    pantoprazole (PROTONIX) 40 MG tablet Take 1 tablet (40 mg total) by mouth 2 (two) times daily. Qty: 60 tablet,  Refills: 0      CONTINUE these medications which have NOT CHANGED   Details  ascorbic acid (VITAMIN C) 1000 MG tablet Take 1,000 mg by mouth daily.    Calcium Carb-Cholecalciferol 600-400 MG-UNIT TABS Take 1 tablet by mouth 2 (two) times daily with a meal.    folic acid (FOLVITE) 1 MG tablet Take 1 mg by mouth daily.    vitamin E 400 UNIT capsule Take 400 Units by mouth daily.    Cholecalciferol (VITAMIN D3) 2000 UNITS TABS Take 1 tablet by mouth daily.    donepezil (ARICEPT) 5 MG tablet Take 1 tablet (5 mg total) by mouth at bedtime. Qty: 30 tablet, Refills: 12   Associated Diagnoses: Mild cognitive disorder    gabapentin (NEURONTIN) 300 MG capsule Take 1 capsule by mouth 3 (three) times daily.     inFLIXimab (REMICADE) 100 MG injection Inject into the vein See admin instructions. Every 6 weeks    lovastatin (MEVACOR) 20 MG tablet Take 1 tablet (20 mg total) by mouth at bedtime. Qty: 30 tablet, Refills: 3   Associated Diagnoses: Hypercholesteremia    methotrexate 2.5 MG tablet Take 6 tablets by mouth once a week.     MULTIPLE VITAMIN PO Take 1 tablet by mouth daily.    Phosphatidylserine-DHA-EPA 100-19.5-6.5 MG CAPS Take 1 tablet by mouth daily.    sertraline (ZOLOFT) 50 MG tablet Take 1 tablet (50 mg total) by mouth daily. Qty: 30 tablet, Refills: 12  STOP taking these medications     aspirin EC 81 MG tablet      doxycycline (VIBRA-TABS) 100 MG tablet      naproxen sodium (ANAPROX) 220 MG tablet          DISCHARGE INSTRUCTIONS:   Follow-up PMD one week, Dr. Candace Cruise in 2 weeks.  If you experience worsening of your admission symptoms, develop shortness of breath, life threatening emergency, suicidal or homicidal thoughts you must seek medical attention immediately by calling 911 or calling your MD immediately  if symptoms less severe.  You Must read complete instructions/literature along with all the possible adverse reactions/side effects for all the Medicines  you take and that have been prescribed to you. Take any new Medicines after you have completely understood and accept all the possible adverse reactions/side effects.   Please note  You were cared for by a hospitalist during your hospital stay. If you have any questions about your discharge medications or the care you received while you were in the hospital after you are discharged, you can call the unit and asked to speak with the hospitalist on call if the hospitalist that took care of you is not available. Once you are discharged, your primary care physician will handle any further medical issues. Please note that NO REFILLS for any discharge medications will be authorized once you are discharged, as it is imperative that you return to your primary care physician (or establish a relationship with a primary care physician if you do not have one) for your aftercare needs so that they can reassess your need for medications and monitor your lab values.    Today   CHIEF COMPLAINT:   Chief Complaint  Patient presents with  . Nausea  . Vomiting  . Loss of Consciousness    HISTORY OF PRESENT ILLNESS:  Morgan Dennis  is a 81 y.o. female with a known history of rheumatoid arthritis on anti-inflammatory ischemic with nausea vomiting and syncope. Patient was found to have a gastric ulcer.   VITAL SIGNS:  Blood pressure 123/54, pulse 73, temperature 98.2 F (36.8 C), temperature source Oral, resp. rate 17, height 5\' 4"  (1.626 m), weight 69.627 kg (153 lb 8 oz), SpO2 92 %.  I/O:    PHYSICAL EXAMINATION:  GENERAL:  81 y.o.-year-old patient lying in the bed with no acute distress.  EYES: Pupils equal, round, reactive to light and accommodation. No scleral icterus. Extraocular muscles intact.  HEENT: Head atraumatic, normocephalic. Oropharynx and nasopharynx clear.  NECK:  Supple, no jugular venous distention. No thyroid enlargement, no tenderness.  LUNGS: Normal breath sounds bilaterally, no  wheezing, rales,rhonchi or crepitation. No use of accessory muscles of respiration.  CARDIOVASCULAR: S1, S2 normal. 4/6 systolic murmurs, no rubs, or gallops.  ABDOMEN: Soft, non-tender, non-distended. Bowel sounds present. No organomegaly or mass.  EXTREMITIES: No pedal edema, cyanosis, or clubbing.  NEUROLOGIC: Cranial nerves II through XII are intact. Muscle strength 5/5 in all extremities. Sensation intact. Gait not checked.  PSYCHIATRIC: The patient is alert and oriented x 3.  SKIN: No obvious rash, lesion, or ulcer.   DATA REVIEW:   CBC  Recent Labs Lab 08/18/15 2136 08/19/15 0439  WBC 10.8  --   HGB 9.3* 9.4*  HCT 27.7*  --   PLT 106*  --     Chemistries   Recent Labs Lab 08/18/15 0027  08/19/15 0439  NA 141  < > 143  K 3.4*  < > 4.1  CL 110  < >  115*  CO2 27  < > 25  GLUCOSE 154*  < > 92  BUN 52*  < > 17  CREATININE 0.63  < > 0.66  CALCIUM 8.4*  < > 8.1*  AST 26  --   --   ALT 14  --   --   ALKPHOS 46  --   --   BILITOT 0.4  --   --   < > = values in this interval not displayed.    RADIOLOGY:  Ct Abdomen Pelvis W Contrast  08/18/2015   CLINICAL DATA:  Hematemesis.  EXAM: CT ABDOMEN AND PELVIS WITH CONTRAST  TECHNIQUE: Multidetector CT imaging of the abdomen and pelvis was performed using the standard protocol following bolus administration of intravenous contrast.  CONTRAST:  115mL OMNIPAQUE IOHEXOL 300 MG/ML  SOLN  COMPARISON:  None.  FINDINGS: There is a large volume of hyperdense liquid within the gastric lumen. This could represent blood. There are normal appearances of the liver, gallbladder, pancreas, spleen, adrenals and kidneys except for a few incidental splenic granulomata. Small bowel appears unremarkable. There is extensive colonic diverticulosis.  The abdominal aorta is normal in caliber. There is no atherosclerotic calcification. There is no adenopathy in the abdomen or pelvis. The uterus and ovaries are remarkable only for a midline posterior  uterine fibroid measuring less than 2 cm.  No acute inflammatory changes evident in the abdomen or pelvis. Mild atelectatic appearing posterior base opacities are present in both lungs. No significant skeletal lesion is evident. There is a benign vertebral hemangioma at T11.  A small fat containing umbilical hernia is incidentally noted.  IMPRESSION: 1. Large volume of hyperdense liquid within the gastric lumen, possibly blood. 2. Diverticulosis 3. Small midline posterior uterine fibroid. 4. Small fat containing umbilical hernia. 5. Incidentally noted benign vertebral hemangioma at T11   Electronically Signed   By: Andreas Newport M.D.   On: 08/18/2015 03:47   Management plans discussed with the patient, family and they are in agreement.  CODE STATUS:     Code Status Orders        Start     Ordered   08/18/15 0141  Full code   Continuous     08/18/15 0141      TOTAL TIME TAKING CARE OF THIS PATIENT: 35 minutes.    Loletha Grayer M.D on 08/19/2015 at 8:54 AM  Between 7am to 6pm - Pager - [phone]  After 6pm go to [email or web address] - password [email or web address] [facility]  Office  [phone]  CC: Primary care physician; [name], MD

## 2015-08-19 NOTE — Telephone Encounter (Signed)
Spoke with patient. She feels better but still tired. She has f/u with Dr. Candace Cruise scheduled also. She will let us know if she needs anything before her follow up with [facility]

## 2015-08-20 LAB — TYPE AND SCREEN
ABO/RH(D): O POS
ANTIBODY SCREEN: NEGATIVE
UNIT DIVISION: 0
Unit division: 0
Unit division: 0

## 2015-08-20 LAB — SURGICAL PATHOLOGY

## 2015-08-26 ENCOUNTER — Encounter: Payer: Self-pay | Admitting: Family Medicine

## 2015-08-26 ENCOUNTER — Ambulatory Visit (INDEPENDENT_AMBULATORY_CARE_PROVIDER_SITE_OTHER): Payer: Medicare Other | Admitting: Family Medicine

## 2015-08-26 VITALS — BP 128/58 | HR 72 | Temp 97.6°F | Resp 16 | Wt 151.0 lb

## 2015-08-26 DIAGNOSIS — D649 Anemia, unspecified: Secondary | ICD-10-CM

## 2015-08-26 DIAGNOSIS — K922 Gastrointestinal hemorrhage, unspecified: Secondary | ICD-10-CM | POA: Diagnosis not present

## 2015-08-26 NOTE — Progress Notes (Signed)
Patient ID: Morgan Dennis, female   DOB: June 25, 1934, 81 y.o.   MRN: [id number]    Subjective:  HPI Pt is here today for a hospital follow up. She was in the hospital from 08/18/15-08/19/15 for a upper GI bleed. They did a endoscopy and found a gastric ulcer. Given protonix IV and rx for protonix BID f/u with Dr. Candace Cruise. PT was advised to stop ASA and Aleve can take Tylenol PRN Hemoglobin stablized at 9.4.  Hospital Dx- Upper GI bleed           Gastric ulcer            RA            Acute blood loss anemia   Pt reports that she is feeling much better. She reports that she was falling a lot and did not feel well and her husband called EMS to come get her and take her to the ER.   Prior to Admission medications   Medication Sig Start Date End Date Taking? Authorizing Provider  acetaminophen (TYLENOL) 325 MG tablet Take 2 tablets (650 mg total) by mouth every 6 (six) hours as needed for mild pain (or Fever >/= 101). 08/19/15  Yes Loletha Grayer, MD  ascorbic acid (VITAMIN C) 1000 MG tablet Take 1,000 mg by mouth daily.   Yes Historical Provider, MD  Calcium Carb-Cholecalciferol 600-400 MG-UNIT TABS Take 1 tablet by mouth 2 (two) times daily with a meal.   Yes Historical Provider, MD  Cholecalciferol (VITAMIN D3) 2000 UNITS TABS Take 1 tablet by mouth daily.   Yes Historical Provider, MD  donepezil (ARICEPT) 5 MG tablet Take 1 tablet (5 mg total) by mouth at bedtime. 06/10/15  Yes Ivon Oelkers Maceo Pro., MD  folic acid (FOLVITE) 1 MG tablet Take 1 mg by mouth daily.   Yes Historical Provider, MD  gabapentin (NEURONTIN) 300 MG capsule Take 1 capsule by mouth 3 (three) times daily.  06/08/15  Yes Historical Provider, MD  lovastatin (MEVACOR) 20 MG tablet Take 1 tablet (20 mg total) by mouth at bedtime. Patient taking differently: Take 20 mg by mouth daily with supper.  06/08/15  Yes Kallan Bischoff Maceo Pro., MD  methotrexate 2.5 MG tablet Take 6 tablets by mouth once a week.    Yes Historical Provider, MD    MULTIPLE VITAMIN PO Take 1 tablet by mouth daily. 06/12/11  Yes Historical Provider, MD  pantoprazole (PROTONIX) 40 MG tablet Take 1 tablet (40 mg total) by mouth 2 (two) times daily. 08/19/15  Yes Loletha Grayer, MD  Phosphatidylserine-DHA-EPA 100-19.5-6.5 MG CAPS Take 1 tablet by mouth daily. 01/14/15  Yes Historical Provider, MD  sertraline (ZOLOFT) 50 MG tablet Take 1 tablet (50 mg total) by mouth daily. 08/12/15  Yes Lorana Maffeo Maceo Pro., MD  vitamin E 400 UNIT capsule Take 400 Units by mouth daily.   Yes Historical Provider, MD  inFLIXimab (REMICADE) 100 MG injection Inject into the vein See admin instructions. Every 6 weeks    Historical Provider, MD    Patient Active Problem List   Diagnosis Date Noted  . Upper GI bleed 08/18/2015  . Adaptation reaction 04/30/2015  . Absolute anemia 04/30/2015  . Acid reflux 04/30/2015  . HLD (hyperlipidemia) 04/30/2015  . Malaise and fatigue 04/30/2015  . Affective disorder, major 04/30/2015  . Bad memory 04/30/2015  . Mild cognitive disorder 04/30/2015  . Fungal infection of toenail 04/30/2015  . Arthritis, degenerative 04/30/2015  . OP (osteoporosis) 04/30/2015  . Arthritis  or polyarthritis, rheumatoid 04/30/2015  . Avitaminosis D 05/12/2014    Past Medical History  Diagnosis Date  . Arthritis   . GERD (gastroesophageal reflux disease)     Social History   Social History  . Marital Status: Married    Spouse Name: Percell Miller  . Number of Children: 1  . Years of Education: 12   Occupational History  . retired    Social History Main Topics  . Smoking status: Never Smoker   . Smokeless tobacco: Never Used  . Alcohol Use: No  . Drug Use: No  . Sexual Activity: No   Other Topics Concern  . Not on file   Social History Narrative    Allergies  Allergen Reactions  . Evista  [Raloxifene]     Other reaction(s): Joint Pains  . Ibandronic Acid     Other reaction(s): Muscle Pain  . Penicillins     Other reaction(s): Rash  .  Risedronate Sodium     Other reaction(s): Joint Pains    Review of Systems  Constitutional: Negative.   HENT: Negative.   Eyes: Negative.   Respiratory: Negative.   Cardiovascular: Negative.   Gastrointestinal: Negative.   Genitourinary: Negative.   Musculoskeletal: Negative.   Skin: Negative.   Neurological: Negative.   Endo/Heme/Allergies: Negative.   Psychiatric/Behavioral: Negative.     Immunization History  Administered Date(s) Administered  . Pneumococcal Polysaccharide-23 09/14/2008, 04/06/2009  . Td 05/03/2004, 04/06/2009  . Tdap 11/15/2011   Objective:  BP 128/58 mmHg  Pulse 72  Temp(Src) 97.6 F (36.4 C) (Oral)  Resp 16  Wt 151 lb (68.493 kg)  Physical Exam  Constitutional: She is oriented to person, place, and time and well-developed, well-nourished, and in no distress.  HENT:  Head: Normocephalic and atraumatic.  Right Ear: External ear normal.  Left Ear: External ear normal.  Nose: Nose normal.  Eyes: Conjunctivae are normal.  Neck: Neck supple.  Cardiovascular: Normal rate, regular rhythm and normal heart sounds.   Pulmonary/Chest: Effort normal and breath sounds normal.  Abdominal: Soft.  Neurological: She is alert and oriented to person, place, and time.  Skin: Skin is warm and dry.  Psychiatric: Mood, memory, affect and judgment normal.    Lab Results  Component Value Date   WBC 10.8 08/18/2015   HGB 9.4* 08/19/2015   HCT 27.7* 08/18/2015   PLT 106* 08/18/2015   GLUCOSE 92 08/19/2015   CHOL 195 06/11/2015   TRIG 188* 06/11/2015   HDL 41 06/11/2015   LDLCALC 116* 06/11/2015   TSH 4.180 06/11/2015    CMP     Component Value Date/Time   NA 143 08/19/2015 0439   NA 142 06/11/2015 0807   K 4.1 08/19/2015 0439   CL 115* 08/19/2015 0439   CO2 25 08/19/2015 0439   GLUCOSE 92 08/19/2015 0439   GLUCOSE 96 06/11/2015 0807   BUN 17 08/19/2015 0439   BUN 16 06/11/2015 0807   CREATININE 0.66 08/19/2015 0439   CREATININE 0.8 01/29/2014    CALCIUM 8.1* 08/19/2015 0439   PROT 5.5* 08/18/2015 0027   PROT 7.3 06/11/2015 0807   ALBUMIN 3.0* 08/18/2015 0027   AST 26 08/18/2015 0027   ALT 14 08/18/2015 0027   ALKPHOS 46 08/18/2015 0027   BILITOT 0.4 08/18/2015 0027   BILITOT 0.6 06/11/2015 0807   GFRNONAA >60 08/19/2015 0439   GFRAA >60 08/19/2015 0439   . Assessment and Plan :  1. Upper GI bleed/PUD  - CBC with Differential/Platelet - Iron  2. Anemia, unspecified anemia type - CBC with Differential/Platelet - Iron RTC 1 month if stable cinically.  3.MCI/AD  I have done the exam and reviewed the above chart and it is accurate to the best of my knowledge.   Miguel Aschoff MD [facility] 08/26/2015 2:48 PM

## 2015-08-27 LAB — CBC WITH DIFFERENTIAL/PLATELET
BASOS ABS: 0 10*3/uL (ref 0.0–0.2)
Basos: 1 %
EOS (ABSOLUTE): 0.3 10*3/uL (ref 0.0–0.4)
Eos: 6 %
HEMATOCRIT: 33.8 % — AB (ref 34.0–46.6)
Hemoglobin: 10.8 g/dL — ABNORMAL LOW (ref 11.1–15.9)
Immature Grans (Abs): 0 10*3/uL (ref 0.0–0.1)
Immature Granulocytes: 0 %
LYMPHS ABS: 2.3 10*3/uL (ref 0.7–3.1)
Lymphs: 38 %
MCH: 30.6 pg (ref 26.6–33.0)
MCHC: 32 g/dL (ref 31.5–35.7)
MCV: 96 fL (ref 79–97)
MONOS ABS: 1.1 10*3/uL — AB (ref 0.1–0.9)
Monocytes: 18 %
Neutrophils Absolute: 2.2 10*3/uL (ref 1.4–7.0)
Neutrophils: 37 %
PLATELETS: 209 10*3/uL (ref 150–379)
RBC: 3.53 x10E6/uL — AB (ref 3.77–5.28)
RDW: 14.7 % (ref 12.3–15.4)
WBC: 6 10*3/uL (ref 3.4–10.8)

## 2015-08-27 LAB — IRON: IRON: 263 ug/dL — AB (ref 27–139)

## 2015-09-02 ENCOUNTER — Ambulatory Visit: Payer: Medicare Other | Admitting: Family Medicine

## 2015-09-23 ENCOUNTER — Ambulatory Visit (INDEPENDENT_AMBULATORY_CARE_PROVIDER_SITE_OTHER): Payer: Medicare Other | Admitting: Family Medicine

## 2015-09-23 VITALS — BP 118/60 | HR 64 | Temp 97.4°F | Resp 16 | Wt 151.0 lb

## 2015-09-23 DIAGNOSIS — F068 Other specified mental disorders due to known physiological condition: Secondary | ICD-10-CM

## 2015-09-23 DIAGNOSIS — D649 Anemia, unspecified: Secondary | ICD-10-CM | POA: Diagnosis not present

## 2015-09-23 DIAGNOSIS — F09 Unspecified mental disorder due to known physiological condition: Secondary | ICD-10-CM

## 2015-09-23 NOTE — Progress Notes (Signed)
Patient ID: Morgan Dennis, female   DOB: 1934-09-13, 81 y.o.   MRN: [id number]    Subjective:  HPI Pt is here today for a follow up from anemia. LOV was 08/26/15 we checked CBC and her blood count was better and iron was 263. Pt reports that she is feeling fairly good, when she is not hurting too bad from the RA. PT reports that she feels like her memory is worse.   Cognitive Testing - 6-CIT  Correct? Score   What year is it? yes 0 0 or 4  What month is it? yes 0 0 or 3  Memorize:    Pia Mau,  44,  [place],      What time is it? (within 1 hour) yes 0 0 or 3  Count backwards from 20 yes 0 0, 2, or 4  Name the months of the year no 1 0, 2, or 4  Repeat name & address above no 10 0, 2, 4, 6, 8, or 10       TOTAL SCORE  10/28   Interpretation:  Abnormal- 10  Normal (0-7) Abnormal (8-28)     Prior to Admission medications   Medication Sig Start Date End Date Taking? Authorizing Provider  acetaminophen (TYLENOL) 325 MG tablet Take 2 tablets (650 mg total) by mouth every 6 (six) hours as needed for mild pain (or Fever >/= 101). 08/19/15  Yes Loletha Grayer, MD  ascorbic acid (VITAMIN C) 1000 MG tablet Take 1,000 mg by mouth daily.   Yes Historical Provider, MD  Calcium Carb-Cholecalciferol 600-400 MG-UNIT TABS Take 1 tablet by mouth 2 (two) times daily with a meal.   Yes Historical Provider, MD  Cholecalciferol (VITAMIN D3) 2000 UNITS TABS Take 1 tablet by mouth daily.   Yes Historical Provider, MD  donepezil (ARICEPT) 5 MG tablet Take 1 tablet (5 mg total) by mouth at bedtime. 06/10/15  Yes Richard Maceo Pro., MD  folic acid (FOLVITE) 1 MG tablet Take 1 mg by mouth daily.   Yes Historical Provider, MD  gabapentin (NEURONTIN) 300 MG capsule Take 1 capsule by mouth 3 (three) times daily.  06/08/15  Yes Historical Provider, MD  lovastatin (MEVACOR) 20 MG tablet Take 1 tablet (20 mg total) by mouth at bedtime. Patient taking differently: Take 20 mg by mouth daily with supper.   06/08/15  Yes Richard Maceo Pro., MD  methotrexate 2.5 MG tablet Take 6 tablets by mouth once a week.    Yes Historical Provider, MD  MULTIPLE VITAMIN PO Take 1 tablet by mouth daily. 06/12/11  Yes Historical Provider, MD  Phosphatidylserine-DHA-EPA 100-19.5-6.5 MG CAPS Take 1 tablet by mouth daily. 01/14/15  Yes Historical Provider, MD  sertraline (ZOLOFT) 50 MG tablet Take 1 tablet (50 mg total) by mouth daily. 08/12/15  Yes Richard Maceo Pro., MD  vitamin E 400 UNIT capsule Take 400 Units by mouth daily.   Yes Historical Provider, MD    Patient Active Problem List   Diagnosis Date Noted  . Upper GI bleed 08/18/2015  . Adaptation reaction 04/30/2015  . Absolute anemia 04/30/2015  . Acid reflux 04/30/2015  . HLD (hyperlipidemia) 04/30/2015  . Malaise and fatigue 04/30/2015  . Affective disorder, major ([place]) 04/30/2015  . Bad memory 04/30/2015  . Mild cognitive disorder 04/30/2015  . Fungal infection of toenail 04/30/2015  . Arthritis, degenerative 04/30/2015  . OP (osteoporosis) 04/30/2015  . Arthritis or polyarthritis, rheumatoid ([place]) 04/30/2015  . Avitaminosis D  05/12/2014  . Rheumatoid arthritis ([place]) 03/17/2014    Past Medical History  Diagnosis Date  . Arthritis   . GERD (gastroesophageal reflux disease)     Social History   Social History  . Marital Status: Married    Spouse Name: Percell Miller  . Number of Children: 1  . Years of Education: 12   Occupational History  . retired    Social History Main Topics  . Smoking status: Never Smoker   . Smokeless tobacco: Never Used  . Alcohol Use: No  . Drug Use: No  . Sexual Activity: No   Other Topics Concern  . Not on file   Social History Narrative    Allergies  Allergen Reactions  . Evista  [Raloxifene]     Other reaction(s): Joint Pains  . Ibandronic Acid     Other reaction(s): Muscle Pain  . Penicillins     Other reaction(s): Rash  . Risedronate Sodium     Other reaction(s): Joint Pains    Review  of Systems  Constitutional: Negative.   HENT: Negative.   Eyes: Negative.   Respiratory: Negative.   Cardiovascular: Negative.   Gastrointestinal: Negative.   Genitourinary: Negative.   Musculoskeletal: Positive for joint pain.  Skin: Negative.   Neurological: Negative.   Endo/Heme/Allergies: Negative.   Psychiatric/Behavioral: Positive for memory loss.   patient thinks she has overactive bladder urinary urgency and the need to go. No stress incontinence and symptoms. She now needs to wear a pad because of the symptoms.  Immunization History  Administered Date(s) Administered  . Pneumococcal Polysaccharide-23 09/14/2008, 04/06/2009  . Td 05/03/2004, 04/06/2009  . Tdap 11/15/2011   Objective:  BP 118/60 mmHg  Pulse 64  Temp(Src) 97.4 F (36.3 C) (Oral)  Resp 16  Wt 151 lb (68.493 kg)  Physical Exam  Constitutional: She is oriented to person, place, and time and well-developed, well-nourished, and in no distress.  HENT:  Head: Normocephalic and atraumatic.  Right Ear: External ear normal.  Left Ear: External ear normal.  Nose: Nose normal.  Eyes: Conjunctivae are normal.  Neck: Neck supple.  Cardiovascular: Normal rate, regular rhythm and normal heart sounds.   Pulmonary/Chest: Effort normal and breath sounds normal.  Abdominal: Soft.  Neurological: She is alert and oriented to person, place, and time.  Skin: Skin is warm and dry.  Psychiatric: Mood, memory, affect and judgment normal.    Lab Results  Component Value Date   WBC 6.0 08/26/2015   HGB 9.4* 08/19/2015   HCT 33.8* 08/26/2015   PLT 106* 08/18/2015   GLUCOSE 92 08/19/2015   CHOL 195 06/11/2015   TRIG 188* 06/11/2015   HDL 41 06/11/2015   LDLCALC 116* 06/11/2015   TSH 4.180 06/11/2015    CMP     Component Value Date/Time   NA 143 08/19/2015 0439   NA 142 06/11/2015 0807   K 4.1 08/19/2015 0439   CL 115* 08/19/2015 0439   CO2 25 08/19/2015 0439   GLUCOSE 92 08/19/2015 0439   GLUCOSE 96  06/11/2015 0807   BUN 17 08/19/2015 0439   BUN 16 06/11/2015 0807   CREATININE 0.66 08/19/2015 0439   CREATININE 0.8 01/29/2014   CALCIUM 8.1* 08/19/2015 0439   PROT 5.5* 08/18/2015 0027   PROT 7.3 06/11/2015 0807   ALBUMIN 3.0* 08/18/2015 0027   AST 26 08/18/2015 0027   ALT 14 08/18/2015 0027   ALKPHOS 46 08/18/2015 0027   BILITOT 0.4 08/18/2015 0027   BILITOT 0.6 06/11/2015 1308  GFRNONAA >60 08/19/2015 0439   GFRAA >60 08/19/2015 0439    Assessment and Plan :  1. Anemia, unspecified anemia type  - CBC with Differential/Platelet - Iron and TIBC  2. Mild cognitive disorder Slowly progressing. Patient scores 10/28 on 6-CIT which is indicates mild to moderate cognitive impairment  I have done the exam and reviewed the above chart and it is accurate to the best of my knowledge. 3. Overactive bladder I am worried patient will have side effects with thegeneric Ditropan, specifically  it might make her cognitive impairment worse. Hopefully she will tolerate Vesicare 5 mg daily and get some symptomatic relief. Return to clinic 2-3 months on this. She is given 3 weeks of samples and will call back regarding this. Miguel Aschoff MD [facility] 09/23/2015 1:50 PM

## 2015-09-24 ENCOUNTER — Encounter: Payer: Self-pay | Admitting: Family Medicine

## 2015-09-24 LAB — IRON AND TIBC
IRON: 267 ug/dL — AB (ref 27–139)
Total Iron Binding Capacity: <284 ug/dL (ref 250–450)
UIBC: <17 ug/dL — ABNORMAL LOW (ref 118–369)

## 2015-09-24 LAB — CBC WITH DIFFERENTIAL/PLATELET
BASOS: 0 %
Basophils Absolute: 0 10*3/uL (ref 0.0–0.2)
EOS (ABSOLUTE): 0.3 10*3/uL (ref 0.0–0.4)
EOS: 5 %
HEMOGLOBIN: 12.1 g/dL (ref 11.1–15.9)
Hematocrit: 38 % (ref 34.0–46.6)
IMMATURE GRANS (ABS): 0 10*3/uL (ref 0.0–0.1)
IMMATURE GRANULOCYTES: 0 %
LYMPHS: 48 %
Lymphocytes Absolute: 3.2 10*3/uL — ABNORMAL HIGH (ref 0.7–3.1)
MCH: 30.6 pg (ref 26.6–33.0)
MCHC: 31.8 g/dL (ref 31.5–35.7)
MCV: 96 fL (ref 79–97)
MONOCYTES: 14 %
Monocytes Absolute: 0.9 10*3/uL (ref 0.1–0.9)
NEUTROS ABS: 2.1 10*3/uL (ref 1.4–7.0)
NEUTROS PCT: 33 %
PLATELETS: 210 10*3/uL (ref 150–379)
RBC: 3.95 x10E6/uL (ref 3.77–5.28)
RDW: 14.6 % (ref 12.3–15.4)
WBC: 6.6 10*3/uL (ref 3.4–10.8)

## 2015-10-12 ENCOUNTER — Other Ambulatory Visit: Payer: Self-pay | Admitting: Family Medicine

## 2015-10-12 DIAGNOSIS — R3589 Other polyuria: Secondary | ICD-10-CM | POA: Insufficient documentation

## 2015-10-12 DIAGNOSIS — R358 Other polyuria: Principal | ICD-10-CM

## 2015-10-12 DIAGNOSIS — R350 Frequency of micturition: Secondary | ICD-10-CM | POA: Insufficient documentation

## 2015-10-12 MED ORDER — SOLIFENACIN SUCCINATE 5 MG PO TABS: 5.0000 mg | ORAL_TABLET | Freq: Every day | ORAL | Status: DC

## 2015-10-12 NOTE — Telephone Encounter (Signed)
Pt husband Ed called states Dr Rosanna Randy gave pt samples of Vesicare 5mg .  Pt states this helping and would like to have a Rx for this.  Medic[facility].  CB#[phone]/MW

## 2015-10-26 ENCOUNTER — Other Ambulatory Visit: Payer: Self-pay

## 2015-10-26 DIAGNOSIS — E7800 Pure hypercholesterolemia, unspecified: Secondary | ICD-10-CM

## 2015-10-26 MED ORDER — LOVASTATIN 20 MG PO TABS: 20.0000 mg | ORAL_TABLET | Freq: Every day | ORAL | Status: DC

## 2015-10-27 ENCOUNTER — Other Ambulatory Visit: Payer: Self-pay

## 2015-10-27 DIAGNOSIS — E7800 Pure hypercholesterolemia, unspecified: Secondary | ICD-10-CM

## 2015-10-27 MED ORDER — LOVASTATIN 20 MG PO TABS: 20.0000 mg | ORAL_TABLET | Freq: Every day | ORAL | Status: DC

## 2015-11-23 ENCOUNTER — Ambulatory Visit (INDEPENDENT_AMBULATORY_CARE_PROVIDER_SITE_OTHER): Payer: Medicare Other | Admitting: Family Medicine

## 2015-11-23 VITALS — BP 118/78 | HR 76 | Temp 98.1°F | Resp 14 | Wt 148.0 lb

## 2015-11-23 DIAGNOSIS — G729 Myopathy, unspecified: Secondary | ICD-10-CM | POA: Diagnosis not present

## 2015-11-23 DIAGNOSIS — D649 Anemia, unspecified: Secondary | ICD-10-CM | POA: Diagnosis not present

## 2015-11-23 DIAGNOSIS — N3281 Overactive bladder: Secondary | ICD-10-CM

## 2015-11-23 NOTE — Progress Notes (Signed)
Patient ID: Morgan Dennis, female   DOB: 06-20-1934, 81 y.o.   MRN: [id number]   EULONDA BRAILSFORD  MRN: [id number] DOB: [birth year]  Subjective:  HPI   1. Overactive bladder The patient is a 81 year old female who presents for follow up of her overactive bladder.  She was last seen on 09/23/15.  At that time she was started on Vesicare.  She states that when she saw Dr. Jefm Bryant he stopped several of her medicines, including Vesicare, Lovastatin and Remicade.  She can not tell why he did this but did say that he took her off of the Remicade because of stomach issues.  She states that when she started the Vesicare she had been going to the bathroom less often.  She said that since being off of it she noticed that she is still doing better than before she started  the medicine.  The patient has only been off of the [place] since 11/17/15. 2.Cognitive issues.  Her memory is getting worse and her husband confirms this.   Patient Active Problem List   Diagnosis Date Noted  . Frequency of urination and polyuria 10/12/2015  . Upper GI bleed 08/18/2015  . Adaptation reaction 04/30/2015  . Absolute anemia 04/30/2015  . Acid reflux 04/30/2015  . HLD (hyperlipidemia) 04/30/2015  . Malaise and fatigue 04/30/2015  . Affective disorder, major ([place]) 04/30/2015  . Bad memory 04/30/2015  . Mild cognitive disorder 04/30/2015  . Fungal infection of toenail 04/30/2015  . Arthritis, degenerative 04/30/2015  . OP (osteoporosis) 04/30/2015  . Arthritis or polyarthritis, rheumatoid ([place]) 04/30/2015  . Avitaminosis D 05/12/2014  . Rheumatoid arthritis ([place]) 03/17/2014    Past Medical History  Diagnosis Date  . Arthritis   . GERD (gastroesophageal reflux disease)     Social History   Social History  . Marital Status: Married    Spouse Name: Percell Miller  . Number of Children: 1  . Years of Education: 12   Occupational History  . retired    Social History Main Topics  . Smoking status: Never Smoker   .  Smokeless tobacco: Never Used  . Alcohol Use: No  . Drug Use: No  . Sexual Activity: No   Other Topics Concern  . Not on file   Social History Narrative    Outpatient Prescriptions Prior to Visit  Medication Sig Dispense Refill  . acetaminophen (TYLENOL) 325 MG tablet Take 2 tablets (650 mg total) by mouth every 6 (six) hours as needed for mild pain (or Fever >/= 101).    Marland Kitchen ascorbic acid (VITAMIN C) 1000 MG tablet Take 1,000 mg by mouth daily.    . Calcium Carb-Cholecalciferol 600-400 MG-UNIT TABS Take 1 tablet by mouth 2 (two) times daily with a meal.    . Cholecalciferol (VITAMIN D3) 2000 UNITS TABS Take 1 tablet by mouth daily.    Marland Kitchen donepezil (ARICEPT) 5 MG tablet Take 1 tablet (5 mg total) by mouth at bedtime. 30 tablet 12  . folic acid (FOLVITE) 1 MG tablet Take 1 mg by mouth daily.    Marland Kitchen gabapentin (NEURONTIN) 300 MG capsule Take 1 capsule by mouth 3 (three) times daily.     . methotrexate 2.5 MG tablet Take 6 tablets by mouth once a week.     . MULTIPLE VITAMIN PO Take 1 tablet by mouth daily.    . Phosphatidylserine-DHA-EPA 100-19.5-6.5 MG CAPS Take 1 tablet by mouth daily.    . sertraline (ZOLOFT) 50 MG tablet Take 1 tablet (  50 mg total) by mouth daily. 30 tablet 12  . vitamin E 400 UNIT capsule Take 400 Units by mouth daily.    Marland Kitchen lovastatin (MEVACOR) 20 MG tablet Take 1 tablet (20 mg total) by mouth at bedtime. (Patient not taking: Reported on 11/23/2015) 30 tablet 6  . solifenacin (VESICARE) 5 MG tablet Take 1 tablet (5 mg total) by mouth daily. (Patient not taking: Reported on 11/23/2015) 30 tablet 12   No facility-administered medications prior to visit.    Allergies  Allergen Reactions  . Evista  [Raloxifene]     Other reaction(s): Joint Pains  . Ibandronic Acid     Other reaction(s): Muscle Pain  . Penicillins     Other reaction(s): Rash  . Risedronate Sodium     Other reaction(s): Joint Pains    Review of Systems  Constitutional: Negative for fever and chills.   Respiratory: Negative for cough, shortness of breath and wheezing.   Cardiovascular: Negative for chest pain, orthopnea and leg swelling.  Genitourinary: Positive for frequency. Negative for dysuria and hematuria.  Neurological: Negative for dizziness, weakness and headaches.   Objective:  BP 118/78 mmHg  Pulse 76  Temp(Src) 98.1 F (36.7 C) (Oral)  Resp 14  Wt 148 lb (67.132 kg)  Physical Exam  Constitutional: She is well-developed, well-nourished, and in no distress.  HENT:  Head: Normocephalic and atraumatic.  Eyes: Pupils are equal, round, and reactive to light.  Neck: Normal range of motion.  Cardiovascular: Normal rate, regular rhythm and normal heart sounds.   Pulmonary/Chest: Effort normal and breath sounds normal.  Abdominal: Soft. Bowel sounds are normal.  Skin: Skin is warm and dry.  Psychiatric: Mood and affect normal.    Assessment and Plan :   1. Anemia, unspecified anemia type H/o upper GI bleed. Follow CBC.  2. Overactive bladder  3. Myopathy 4.Early Alzheimers Disease On aricept. MMSE on next OV. 5.Rhematoid Arthritis Worse recently off of meds due to side effects. 6.HLD 7.MDD In remission. I have done the exam and reviewed the above chart and it is accurate to the best of my knowledge.   Miguel Aschoff MD [facility] 11/23/2015 1:48 PM

## 2015-12-24 DIAGNOSIS — G629 Polyneuropathy, unspecified: Secondary | ICD-10-CM | POA: Diagnosis not present

## 2016-01-31 DIAGNOSIS — M0579 Rheumatoid arthritis with rheumatoid factor of multiple sites without organ or systems involvement: Secondary | ICD-10-CM | POA: Diagnosis not present

## 2016-01-31 DIAGNOSIS — M0589 Other rheumatoid arthritis with rheumatoid factor of multiple sites: Secondary | ICD-10-CM | POA: Diagnosis not present

## 2016-02-02 ENCOUNTER — Other Ambulatory Visit: Payer: Self-pay

## 2016-02-02 MED ORDER — PHOSPHATIDYLSERINE-DHA-EPA 100-19.5-6.5 MG PO CAPS: 1.0000 | ORAL_CAPSULE | Freq: Every day | ORAL | Status: DC

## 2016-02-09 DIAGNOSIS — R413 Other amnesia: Secondary | ICD-10-CM | POA: Diagnosis not present

## 2016-02-09 DIAGNOSIS — G629 Polyneuropathy, unspecified: Secondary | ICD-10-CM | POA: Diagnosis not present

## 2016-02-09 DIAGNOSIS — E559 Vitamin D deficiency, unspecified: Secondary | ICD-10-CM | POA: Diagnosis not present

## 2016-02-09 DIAGNOSIS — M791 Myalgia: Secondary | ICD-10-CM | POA: Diagnosis not present

## 2016-02-09 DIAGNOSIS — Z0000 Encounter for general adult medical examination without abnormal findings: Secondary | ICD-10-CM | POA: Diagnosis not present

## 2016-02-09 DIAGNOSIS — E538 Deficiency of other specified B group vitamins: Secondary | ICD-10-CM | POA: Diagnosis not present

## 2016-02-11 DIAGNOSIS — Z0000 Encounter for general adult medical examination without abnormal findings: Secondary | ICD-10-CM | POA: Diagnosis not present

## 2016-02-14 DIAGNOSIS — M0579 Rheumatoid arthritis with rheumatoid factor of multiple sites without organ or systems involvement: Secondary | ICD-10-CM | POA: Diagnosis not present

## 2016-02-14 DIAGNOSIS — M0589 Other rheumatoid arthritis with rheumatoid factor of multiple sites: Secondary | ICD-10-CM | POA: Diagnosis not present

## 2016-02-15 ENCOUNTER — Other Ambulatory Visit: Payer: Self-pay | Admitting: Neurology

## 2016-02-15 DIAGNOSIS — R413 Other amnesia: Secondary | ICD-10-CM

## 2016-02-22 ENCOUNTER — Ambulatory Visit: Payer: Medicare Other | Admitting: Family Medicine

## 2016-02-28 DIAGNOSIS — M0589 Other rheumatoid arthritis with rheumatoid factor of multiple sites: Secondary | ICD-10-CM | POA: Diagnosis not present

## 2016-02-28 DIAGNOSIS — M0579 Rheumatoid arthritis with rheumatoid factor of multiple sites without organ or systems involvement: Secondary | ICD-10-CM | POA: Diagnosis not present

## 2016-03-07 ENCOUNTER — Ambulatory Visit
Admission: RE | Admit: 2016-03-07 | Discharge: 2016-03-07 | Disposition: A | Discharge status: Home / Self Care | Payer: Medicare HMO | Source: Ambulatory Visit | Attending: Neurology | Admitting: Neurology

## 2016-03-07 DIAGNOSIS — J3489 Other specified disorders of nose and nasal sinuses: Secondary | ICD-10-CM | POA: Insufficient documentation

## 2016-03-07 DIAGNOSIS — G319 Degenerative disease of nervous system, unspecified: Secondary | ICD-10-CM | POA: Diagnosis not present

## 2016-03-07 DIAGNOSIS — G458 Other transient cerebral ischemic attacks and related syndromes: Secondary | ICD-10-CM | POA: Insufficient documentation

## 2016-03-07 DIAGNOSIS — R413 Other amnesia: Secondary | ICD-10-CM | POA: Diagnosis not present

## 2016-03-28 DIAGNOSIS — M0589 Other rheumatoid arthritis with rheumatoid factor of multiple sites: Secondary | ICD-10-CM | POA: Diagnosis not present

## 2016-03-28 DIAGNOSIS — M0579 Rheumatoid arthritis with rheumatoid factor of multiple sites without organ or systems involvement: Secondary | ICD-10-CM | POA: Diagnosis not present

## 2016-04-25 DIAGNOSIS — M0579 Rheumatoid arthritis with rheumatoid factor of multiple sites without organ or systems involvement: Secondary | ICD-10-CM | POA: Diagnosis not present

## 2016-04-25 DIAGNOSIS — M0589 Other rheumatoid arthritis with rheumatoid factor of multiple sites: Secondary | ICD-10-CM | POA: Diagnosis not present

## 2016-05-02 DIAGNOSIS — Z0120 Encounter for dental examination and cleaning without abnormal findings: Secondary | ICD-10-CM | POA: Diagnosis not present

## 2016-05-23 DIAGNOSIS — M1712 Unilateral primary osteoarthritis, left knee: Secondary | ICD-10-CM | POA: Diagnosis not present

## 2016-05-23 DIAGNOSIS — G5791 Unspecified mononeuropathy of right lower limb: Secondary | ICD-10-CM | POA: Diagnosis not present

## 2016-05-23 DIAGNOSIS — M0579 Rheumatoid arthritis with rheumatoid factor of multiple sites without organ or systems involvement: Secondary | ICD-10-CM | POA: Diagnosis not present

## 2016-05-23 DIAGNOSIS — M0589 Other rheumatoid arthritis with rheumatoid factor of multiple sites: Secondary | ICD-10-CM | POA: Diagnosis not present

## 2016-05-23 DIAGNOSIS — G5792 Unspecified mononeuropathy of left lower limb: Secondary | ICD-10-CM | POA: Diagnosis not present

## 2016-06-08 DIAGNOSIS — R413 Other amnesia: Secondary | ICD-10-CM | POA: Diagnosis not present

## 2016-06-08 DIAGNOSIS — G629 Polyneuropathy, unspecified: Secondary | ICD-10-CM | POA: Diagnosis not present

## 2016-07-04 DIAGNOSIS — M0589 Other rheumatoid arthritis with rheumatoid factor of multiple sites: Secondary | ICD-10-CM | POA: Diagnosis not present

## 2016-07-04 DIAGNOSIS — M0579 Rheumatoid arthritis with rheumatoid factor of multiple sites without organ or systems involvement: Secondary | ICD-10-CM | POA: Diagnosis not present

## 2016-07-18 ENCOUNTER — Encounter: Payer: Self-pay | Admitting: Family Medicine

## 2016-07-19 ENCOUNTER — Encounter: Payer: Self-pay | Admitting: Family Medicine

## 2016-07-19 ENCOUNTER — Ambulatory Visit (INDEPENDENT_AMBULATORY_CARE_PROVIDER_SITE_OTHER): Payer: Medicare HMO | Admitting: Family Medicine

## 2016-07-19 VITALS — BP 130/68 | HR 84 | Temp 98.1°F | Resp 16 | Ht 64.0 in | Wt 149.0 lb

## 2016-07-19 DIAGNOSIS — Z23 Encounter for immunization: Secondary | ICD-10-CM | POA: Diagnosis not present

## 2016-07-19 DIAGNOSIS — N3281 Overactive bladder: Secondary | ICD-10-CM | POA: Diagnosis not present

## 2016-07-19 DIAGNOSIS — N644 Mastodynia: Secondary | ICD-10-CM | POA: Diagnosis not present

## 2016-07-19 DIAGNOSIS — Z0000 Encounter for general adult medical examination without abnormal findings: Secondary | ICD-10-CM | POA: Diagnosis not present

## 2016-07-19 DIAGNOSIS — N390 Urinary tract infection, site not specified: Secondary | ICD-10-CM

## 2016-07-19 LAB — POCT URINALYSIS DIPSTICK
Bilirubin, UA: NEGATIVE
GLUCOSE UA: NEGATIVE
KETONES UA: NEGATIVE
Leukocytes, UA: NEGATIVE
NITRITE UA: NEGATIVE
PROTEIN UA: NEGATIVE
RBC UA: NEGATIVE
SPEC GRAV UA: 1.01
UROBILINOGEN UA: 0.2
pH, UA: 7.5
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
## 2016-07-20 LAB — URINE CULTURE

## 2016-07-20 LAB — PLEASE NOTE

## 2016-07-24 ENCOUNTER — Telehealth: Payer: Self-pay | Admitting: Emergency Medicine

## 2016-07-24 DIAGNOSIS — N3281 Overactive bladder: Secondary | ICD-10-CM

## 2016-07-24 MED ORDER — SOLIFENACIN SUCCINATE 5 MG PO TABS: 5.0000 mg | ORAL_TABLET | Freq: Every day | ORAL | 12 refills | Status: DC

## 2016-07-24 NOTE — Telephone Encounter (Signed)
Yes--5mg  daily--thx

## 2016-07-24 NOTE — Telephone Encounter (Signed)
Medication sent. Pt husband advised.

## 2016-07-24 NOTE — Telephone Encounter (Signed)
Spoke with pt about urine culture. Per note, if urine culture was clean we could start Vesicare. Do you still want to do this?

## 2016-07-26 ENCOUNTER — Other Ambulatory Visit: Payer: Self-pay | Admitting: Emergency Medicine

## 2016-07-26 DIAGNOSIS — N644 Mastodynia: Secondary | ICD-10-CM

## 2016-07-26 NOTE — Progress Notes (Signed)
Bilateral mammo scheduled for August 22 at 3:00. Pt husband informed.

## 2016-08-01 DIAGNOSIS — M0589 Other rheumatoid arthritis with rheumatoid factor of multiple sites: Secondary | ICD-10-CM | POA: Diagnosis not present

## 2016-08-01 DIAGNOSIS — M0579 Rheumatoid arthritis with rheumatoid factor of multiple sites without organ or systems involvement: Secondary | ICD-10-CM | POA: Diagnosis not present

## 2016-08-08 ENCOUNTER — Other Ambulatory Visit: Payer: Self-pay | Admitting: Family Medicine

## 2016-08-08 ENCOUNTER — Ambulatory Visit
Admission: RE | Admit: 2016-08-08 | Discharge: 2016-08-08 | Disposition: A | Discharge status: Home / Self Care | Payer: Medicare HMO | Source: Ambulatory Visit | Attending: Family Medicine | Admitting: Family Medicine

## 2016-08-08 DIAGNOSIS — N644 Mastodynia: Secondary | ICD-10-CM

## 2016-08-28 DIAGNOSIS — M0589 Other rheumatoid arthritis with rheumatoid factor of multiple sites: Secondary | ICD-10-CM | POA: Diagnosis not present

## 2016-08-28 DIAGNOSIS — M0579 Rheumatoid arthritis with rheumatoid factor of multiple sites without organ or systems involvement: Secondary | ICD-10-CM | POA: Diagnosis not present

## 2016-09-07 DIAGNOSIS — Z0120 Encounter for dental examination and cleaning without abnormal findings: Secondary | ICD-10-CM | POA: Diagnosis not present

## 2016-09-18 ENCOUNTER — Ambulatory Visit (INDEPENDENT_AMBULATORY_CARE_PROVIDER_SITE_OTHER): Payer: Medicare HMO | Admitting: Family Medicine

## 2016-09-18 VITALS — BP 118/56 | HR 66 | Temp 98.3°F | Resp 14 | Wt 150.0 lb

## 2016-09-18 DIAGNOSIS — R69 Illness, unspecified: Secondary | ICD-10-CM | POA: Diagnosis not present

## 2016-09-18 DIAGNOSIS — E7849 Other hyperlipidemia: Secondary | ICD-10-CM

## 2016-09-18 DIAGNOSIS — F320 Major depressive disorder, single episode, mild: Secondary | ICD-10-CM

## 2016-09-18 DIAGNOSIS — F09 Unspecified mental disorder due to known physiological condition: Secondary | ICD-10-CM | POA: Diagnosis not present

## 2016-09-18 DIAGNOSIS — M069 Rheumatoid arthritis, unspecified: Secondary | ICD-10-CM

## 2016-09-18 DIAGNOSIS — E784 Other hyperlipidemia: Secondary | ICD-10-CM

## 2016-09-18 DIAGNOSIS — Z8639 Personal history of other endocrine, nutritional and metabolic disease: Secondary | ICD-10-CM

## 2016-09-18 DIAGNOSIS — N3281 Overactive bladder: Secondary | ICD-10-CM

## 2016-09-18 MED ORDER — MEMANTINE HCL 5 MG PO TABS: 5.0000 mg | ORAL_TABLET | Freq: Two times a day (BID) | ORAL | 5 refills | Status: DC

## 2016-09-18 NOTE — Progress Notes (Signed)
Morgan Dennis  MRN: [id number] DOB: 1934/05/07  Subjective:  HPI  Patient is here for 2 months follow up.  Depression: patient states she has pretty good days overall. Takes Cymbalta 60 mg daily. Depression screen [facility] 2/9 09/18/2016 07/19/2016 08/26/2015  Decreased Interest 0 0 0  Down, Depressed, Hopeless 0 0 0  PHQ - 2 Score 0 0 0  Altered sleeping 0 - -  Tired, decreased energy 1 - -  Change in appetite 0 - -  Feeling bad or failure about yourself  1 - -  Trouble concentrating 1 - -  Moving slowly or fidgety/restless 0 - -  Suicidal thoughts 0 - -  PHQ-9 Score 3 - -  Difficult doing work/chores Somewhat difficult - -    Hyperlipidemia: Lab Results  Component Value Date   CHOL 195 06/11/2015   HDL 41 06/11/2015   LDLCALC 116 (H) 06/11/2015   TRIG 188 (H) 06/11/2015   MCI: patient takes Aricept 10 mg daily and wonders if there is anything else she can take. MMSE today 23/30.  She was started on Vesicare in August for overactive bladder and she is doing much better now. Patient Active Problem List   Diagnosis Date Noted  . Frequency of urination and polyuria 10/12/2015  . Upper GI bleed 08/18/2015  . Adaptation reaction 04/30/2015  . Absolute anemia 04/30/2015  . Acid reflux 04/30/2015  . HLD (hyperlipidemia) 04/30/2015  . Malaise and fatigue 04/30/2015  . Affective disorder, major 04/30/2015  . Bad memory 04/30/2015  . Mild cognitive disorder 04/30/2015  . Fungal infection of toenail 04/30/2015  . Arthritis, degenerative 04/30/2015  . OP (osteoporosis) 04/30/2015  . Arthritis or polyarthritis, rheumatoid ([place]) 04/30/2015  . Avitaminosis D 05/12/2014  . Rheumatoid arthritis ([place]) 03/17/2014    Past Medical History:  Diagnosis Date  . Arthritis   . GERD (gastroesophageal reflux disease)     Social History   Social History  . Marital status: Married    Spouse name: Percell Miller  . Number of children: 1  . Years of education: 74   Occupational History  .  retired    Social History Main Topics  . Smoking status: Never Smoker  . Smokeless tobacco: Never Used  . Alcohol use No  . Drug use: No  . Sexual activity: No   Other Topics Concern  . Not on file   Social History Narrative  . No narrative on file    Outpatient Encounter Prescriptions as of 09/18/2016  Medication Sig Note  . abatacept (ORENCIA) 250 MG injection Inject into the skin. 07/19/2016: Received from: [place]: Inject 125 mg subcutaneously every 7 (seven) days.  Marland Kitchen acetaminophen (TYLENOL) 325 MG tablet Take 2 tablets (650 mg total) by mouth every 6 (six) hours as needed for mild pain (or Fever >/= 101).   Marland Kitchen ascorbic acid (VITAMIN C) 1000 MG tablet Take 1,000 mg by mouth daily.   . Calcium Carb-Cholecalciferol 600-400 MG-UNIT TABS Take 1 tablet by mouth 2 (two) times daily with a meal.   . Cholecalciferol (VITAMIN D3) 2000 UNITS TABS Take 1 tablet by mouth daily.   Marland Kitchen donepezil (ARICEPT) 5 MG tablet Take 1 tablet (5 mg total) by mouth at bedtime.   . DULoxetine (CYMBALTA) 60 MG capsule Take 60 mg by mouth daily.   . folic acid (FOLVITE) 1 MG tablet Take 1 mg by mouth daily.   Marland Kitchen gabapentin (NEURONTIN) 300 MG capsule Take 1 capsule by mouth 3 (  three) times daily.  06/10/2015: Received from: External Pharmacy Received Sig:   . lovastatin (MEVACOR) 20 MG tablet Take 1 tablet (20 mg total) by mouth at bedtime.   . methotrexate 2.5 MG tablet Take 6 tablets by mouth once a week.  04/30/2015: Received from: [place]:   . MULTIPLE VITAMIN PO Take 1 tablet by mouth daily. 04/30/2015: Received from: [place]:   . Phosphatidylserine-DHA-EPA 100-19.5-6.5 MG CAPS Take 1 tablet by mouth daily.   . solifenacin (VESICARE) 5 MG tablet Take 1 tablet (5 mg total) by mouth daily.   . vitamin E 400 UNIT capsule Take 400 Units by mouth daily.   . [DISCONTINUED] sertraline (ZOLOFT) 50 MG tablet Take 1  tablet (50 mg total) by mouth daily.   . [DISCONTINUED] solifenacin (VESICARE) 5 MG tablet Take 1 tablet (5 mg total) by mouth daily.    No facility-administered encounter medications on file as of 09/18/2016.     Allergies  Allergen Reactions  . Evista  [Raloxifene]     Other reaction(s): Joint Pains  . Ibandronic Acid     Other reaction(s): Muscle Pain  . Penicillins     Other reaction(s): Rash  . Risedronate Sodium     Other reaction(s): Joint Pains    Review of Systems  Constitutional: Positive for malaise/fatigue.  HENT: Positive for hearing loss.   Eyes: Positive for blurred vision.  Respiratory: Negative.   Cardiovascular: Negative.   Gastrointestinal: Negative.   Genitourinary: Positive for frequency and urgency.  Musculoskeletal: Positive for joint pain and myalgias.  Neurological: Positive for weakness. Negative for dizziness and tingling.  Endo/Heme/Allergies: Negative.   Psychiatric/Behavioral: Positive for depression (stable per patient on medication.) and memory loss.   Objective:  BP (!) 118/56   Pulse 66   Temp 98.3 F (36.8 C)   Resp 14   Wt 150 lb (68 kg)   BMI 25.75 kg/m   Physical Exam  Constitutional: She is oriented to person, place, and time and well-developed, well-nourished, and in no distress.  HENT:  Head: Normocephalic and atraumatic.  Right Ear: External ear normal.  Left Ear: External ear normal.  Nose: Nose normal.  Eyes: Conjunctivae are normal. Pupils are equal, round, and reactive to light.  Neck: Normal range of motion. Neck supple.  Cardiovascular: Normal rate, regular rhythm and intact distal pulses.   Murmur heard.  Systolic murmur is present with a grade of 3/6  Pulmonary/Chest: Effort normal and breath sounds normal. No respiratory distress. She has no wheezes.  Abdominal: Soft.  Neurological: She is alert and oriented to person, place, and time.  Skin: Skin is warm and dry.  Psychiatric: Mood, memory, affect and judgment  normal.    Assessment and Plan :  1. Overactive bladder Better on Vesicare. Follow. - CBC w/Diff/Platelet  2. Mild cognitive disorder MMSE 23/30. Worsening. Will add Namenda and follow up in 2 months. I think hearing loss is contributing to her not been able to think well and respond appropriately.  Most likely early Alzheimers. Pt not driving. 3. Other hyperlipidemia Check labs. - Comprehensive metabolic panel - Lipid Panel With LDL/HDL Ratio  4. Major depressive disorder, single episode, mild (HCC) Stable. - CBC w/Diff/Platelet - TSH  5. Rheumatoid arthritis involving multiple sites, unspecified rheumatoid factor presence ([place]) Follow. Patient having hard time with this some days.  6. H/O hyperglycemia/prediabetes - HgB A1c  HPI, Exam and A&P transcribed under direction and in the presence of Richard  Rosanna Randy, MD.

## 2016-09-20 DIAGNOSIS — Z8639 Personal history of other endocrine, nutritional and metabolic disease: Secondary | ICD-10-CM | POA: Diagnosis not present

## 2016-09-20 DIAGNOSIS — R69 Illness, unspecified: Secondary | ICD-10-CM | POA: Diagnosis not present

## 2016-09-20 DIAGNOSIS — E784 Other hyperlipidemia: Secondary | ICD-10-CM | POA: Diagnosis not present

## 2016-09-20 DIAGNOSIS — N3281 Overactive bladder: Secondary | ICD-10-CM | POA: Diagnosis not present

## 2016-09-21 LAB — CBC WITH DIFFERENTIAL/PLATELET
BASOS: 0 %
Basophils Absolute: 0 10*3/uL (ref 0.0–0.2)
EOS (ABSOLUTE): 0.2 10*3/uL (ref 0.0–0.4)
EOS: 4 %
HEMATOCRIT: 39.1 % (ref 34.0–46.6)
HEMOGLOBIN: 12.7 g/dL (ref 11.1–15.9)
Immature Grans (Abs): 0 10*3/uL (ref 0.0–0.1)
Immature Granulocytes: 0 %
LYMPHS ABS: 2.2 10*3/uL (ref 0.7–3.1)
Lymphs: 36 %
MCH: 31.6 pg (ref 26.6–33.0)
MCHC: 32.5 g/dL (ref 31.5–35.7)
MCV: 97 fL (ref 79–97)
MONOCYTES: 10 %
Monocytes Absolute: 0.6 10*3/uL (ref 0.1–0.9)
NEUTROS ABS: 3.1 10*3/uL (ref 1.4–7.0)
Neutrophils: 50 %
Platelets: 203 10*3/uL (ref 150–379)
RBC: 4.02 x10E6/uL (ref 3.77–5.28)
RDW: 14 % (ref 12.3–15.4)
WBC: 6.2 10*3/uL (ref 3.4–10.8)

## 2016-09-21 LAB — COMPREHENSIVE METABOLIC PANEL
A/G RATIO: 1.7 (ref 1.2–2.2)
ALBUMIN: 4.1 g/dL (ref 3.5–4.8)
ALK PHOS: 86 IU/L (ref 39–117)
ALT: 16 IU/L (ref 0–32)
AST: 26 IU/L (ref 0–40)
BILIRUBIN TOTAL: 0.6 mg/dL (ref 0.0–1.2)
BUN / CREAT RATIO: 18 (ref 12–28)
BUN: 13 mg/dL (ref 8–27)
CO2: 28 mmol/L (ref 18–29)
CREATININE: 0.74 mg/dL (ref 0.57–1.00)
Calcium: 9 mg/dL (ref 8.7–10.3)
Chloride: 100 mmol/L (ref 96–106)
GFR calc Af Amer: 89 mL/min/{1.73_m2} (ref >59)
GFR calc non Af Amer: 77 mL/min/{1.73_m2} (ref >59)
GLOBULIN, TOTAL: 2.4 g/dL (ref 1.5–4.5)
Glucose: 87 mg/dL (ref 65–99)
Potassium: 4.2 mmol/L (ref 3.5–5.2)
SODIUM: 141 mmol/L (ref 134–144)
Total Protein: 6.5 g/dL (ref 6.0–8.5)

## 2016-09-21 LAB — HEMOGLOBIN A1C
ESTIMATED AVERAGE GLUCOSE: 117 mg/dL
HEMOGLOBIN A1C: 5.7 % — AB (ref 4.8–5.6)

## 2016-09-21 LAB — LIPID PANEL WITH LDL/HDL RATIO
CHOLESTEROL TOTAL: 222 mg/dL — AB (ref 100–199)
HDL: 39 mg/dL — ABNORMAL LOW (ref >39)
LDL CALC: 153 mg/dL — AB (ref 0–99)
LDl/HDL Ratio: 3.9 ratio units — ABNORMAL HIGH (ref 0.0–3.2)
Triglycerides: 148 mg/dL (ref 0–149)
VLDL Cholesterol Cal: 30 mg/dL (ref 5–40)

## 2016-09-21 LAB — TSH: TSH: 2.45 u[IU]/mL (ref 0.450–4.500)

## 2016-09-22 ENCOUNTER — Telehealth: Payer: Self-pay | Admitting: Family Medicine

## 2016-09-22 NOTE — Telephone Encounter (Signed)
Pt returned Brittany's call about her lab results and request a call back. Thanks TNP

## 2016-09-25 DIAGNOSIS — M79672 Pain in left foot: Secondary | ICD-10-CM | POA: Diagnosis not present

## 2016-09-25 DIAGNOSIS — M19072 Primary osteoarthritis, left ankle and foot: Secondary | ICD-10-CM | POA: Diagnosis not present

## 2016-09-25 DIAGNOSIS — M1712 Unilateral primary osteoarthritis, left knee: Secondary | ICD-10-CM | POA: Diagnosis not present

## 2016-09-25 DIAGNOSIS — G5792 Unspecified mononeuropathy of left lower limb: Secondary | ICD-10-CM | POA: Diagnosis not present

## 2016-09-25 DIAGNOSIS — G5791 Unspecified mononeuropathy of right lower limb: Secondary | ICD-10-CM | POA: Diagnosis not present

## 2016-09-25 DIAGNOSIS — M0579 Rheumatoid arthritis with rheumatoid factor of multiple sites without organ or systems involvement: Secondary | ICD-10-CM | POA: Diagnosis not present

## 2016-10-02 DIAGNOSIS — Z79899 Other long term (current) drug therapy: Secondary | ICD-10-CM | POA: Diagnosis not present

## 2016-10-02 DIAGNOSIS — M0579 Rheumatoid arthritis with rheumatoid factor of multiple sites without organ or systems involvement: Secondary | ICD-10-CM | POA: Diagnosis not present

## 2016-10-02 DIAGNOSIS — M0589 Other rheumatoid arthritis with rheumatoid factor of multiple sites: Secondary | ICD-10-CM | POA: Diagnosis not present

## 2016-10-16 ENCOUNTER — Telehealth: Payer: Self-pay

## 2016-10-16 NOTE — Telephone Encounter (Signed)
Patient's husband called requesting appointment with Dr. Rosanna Randy for a rash on lower abdominal are. They reports rash has been there since 10/13/2016 pt denies pain. Husband did report that pt does not have shingles vaccine due to medications. Pt scheduled for 10/17/16 at 1:45 pm. CB# [phone]

## 2016-10-16 NOTE — Telephone Encounter (Signed)
Noted-aa 

## 2016-10-17 ENCOUNTER — Ambulatory Visit (INDEPENDENT_AMBULATORY_CARE_PROVIDER_SITE_OTHER): Payer: Medicare HMO | Admitting: Family Medicine

## 2016-10-17 VITALS — BP 140/62 | HR 84 | Temp 97.7°F | Resp 16 | Wt 152.0 lb

## 2016-10-17 DIAGNOSIS — B372 Candidiasis of skin and nail: Secondary | ICD-10-CM

## 2016-10-17 MED ORDER — NYSTATIN 100000 UNIT/GM EX CREA: 1.0000 "application " | TOPICAL_CREAM | Freq: Two times a day (BID) | CUTANEOUS | 0 refills | Status: DC

## 2016-10-17 NOTE — Progress Notes (Signed)
Morgan Dennis  MRN: [id number] DOB: 12/16/1934  Subjective:  HPI  The patient is a 82 year old female who presents for evaluation of a rash on her lower abdomen.  She states that she noticed on 3 days ago.  It is red, with no drainange.  She states that  3 weeks ago she noticed that her pad had slid up into that area and was thinking maybe that is what has caused this rash.  She has not had any fever, itching or blisters.  She does state that at times it does burn. She started using a Neosporin ointment on the area and states it is not quite as red since doing so.  Patient Active Problem List   Diagnosis Date Noted  . Frequency of urination and polyuria 10/12/2015  . Upper GI bleed 08/18/2015  . Adaptation reaction 04/30/2015  . Absolute anemia 04/30/2015  . Acid reflux 04/30/2015  . HLD (hyperlipidemia) 04/30/2015  . Malaise and fatigue 04/30/2015  . Affective disorder, major 04/30/2015  . Bad memory 04/30/2015  . Mild cognitive disorder 04/30/2015  . Fungal infection of toenail 04/30/2015  . Arthritis, degenerative 04/30/2015  . OP (osteoporosis) 04/30/2015  . Arthritis or polyarthritis, rheumatoid ([place]) 04/30/2015  . Avitaminosis D 05/12/2014  . Rheumatoid arthritis ([place]) 03/17/2014    Past Medical History:  Diagnosis Date  . Arthritis   . GERD (gastroesophageal reflux disease)     Social History   Social History  . Marital status: Married    Spouse name: Percell Miller  . Number of children: 1  . Years of education: 48   Occupational History  . retired    Social History Main Topics  . Smoking status: Never Smoker  . Smokeless tobacco: Never Used  . Alcohol use No  . Drug use: No  . Sexual activity: No   Other Topics Concern  . Not on file   Social History Narrative  . No narrative on file    Outpatient Encounter Prescriptions as of 10/17/2016  Medication Sig Note  . abatacept (ORENCIA) 250 MG injection Inject into the skin. 07/19/2016: Received from: [place]: Inject 125 mg subcutaneously every 7 (seven) days.  Marland Kitchen acetaminophen (TYLENOL) 325 MG tablet Take 2 tablets (650 mg total) by mouth every 6 (six) hours as needed for mild pain (or Fever >/= 101).   Marland Kitchen ascorbic acid (VITAMIN C) 1000 MG tablet Take 1,000 mg by mouth daily.   . Calcium Carb-Cholecalciferol 600-400 MG-UNIT TABS Take 1 tablet by mouth 2 (two) times daily with a meal.   . Cholecalciferol (VITAMIN D3) 2000 UNITS TABS Take 1 tablet by mouth daily.   Marland Kitchen donepezil (ARICEPT) 5 MG tablet Take 1 tablet (5 mg total) by mouth at bedtime.   . DULoxetine (CYMBALTA) 60 MG capsule Take 60 mg by mouth daily.   . folic acid (FOLVITE) 1 MG tablet Take 1 mg by mouth daily.   Marland Kitchen gabapentin (NEURONTIN) 300 MG capsule Take 1 capsule by mouth 3 (three) times daily.  06/10/2015: Received from: External Pharmacy Received Sig:   . lovastatin (MEVACOR) 20 MG tablet Take 1 tablet (20 mg total) by mouth at bedtime.   . memantine (NAMENDA) 5 MG tablet Take 1 tablet (5 mg total) by mouth 2 (two) times daily.   . methotrexate 2.5 MG tablet Take 6 tablets by mouth once a week.  04/30/2015: Received from: [place]:   . MULTIPLE VITAMIN PO Take 1  tablet by mouth daily. 04/30/2015: Received from: [place]:   . Phosphatidylserine-DHA-EPA 100-19.5-6.5 MG CAPS Take 1 tablet by mouth daily.   . solifenacin (VESICARE) 5 MG tablet Take 1 tablet (5 mg total) by mouth daily.   . vitamin E 400 UNIT capsule Take 400 Units by mouth daily.    No facility-administered encounter medications on file as of 10/17/2016.     Allergies  Allergen Reactions  . Evista  [Raloxifene]     Other reaction(s): Joint Pains  . Ibandronic Acid     Other reaction(s): Muscle Pain  . Penicillins     Other reaction(s): Rash  . Risedronate Sodium     Other reaction(s): Joint Pains    Review of Systems  Constitutional: Negative for fever and  malaise/fatigue.  Respiratory: Negative for cough, shortness of breath and wheezing.   Cardiovascular: Negative for chest pain and palpitations.  Skin: Positive for rash. Negative for itching.  Neurological: Negative for dizziness, weakness and headaches.   Objective:  BP 140/62 (BP Location: Right Arm, Patient Position: Sitting, Cuff Size: Normal)   Pulse 84   Temp 97.7 F (36.5 C) (Oral)   Resp 16   Wt 152 lb (68.9 kg)   BMI 26.09 kg/m   Physical Exam  Constitutional: She is well-developed, well-nourished, and in no distress.  HENT:  Head: Normocephalic and atraumatic.  Eyes: Conjunctivae are normal.  Neck: No thyromegaly present.  Cardiovascular: Normal rate and regular rhythm.   Pulmonary/Chest: Effort normal.  Abdominal: Soft.  Skin: Skin is warm. Rash noted. There is erythema.  Mild rash consistent with yeast dermatitis under small pannus at the waist  Psychiatric: Mood and affect normal.    Assessment and Plan :  1. Monilial rash  - nystatin cream (MYCOSTATIN); Apply 1 application topically 2 (two) times daily.  Dispense: 30 g; Refill: 0 2. Mild cognitive impairment/early Alzheimer's disease I have done the exam and reviewed the chart and it is accurate to the best of my knowledge. Miguel Aschoff M.D. [facility]

## 2016-11-06 DIAGNOSIS — M0579 Rheumatoid arthritis with rheumatoid factor of multiple sites without organ or systems involvement: Secondary | ICD-10-CM | POA: Diagnosis not present

## 2016-11-14 DIAGNOSIS — Z961 Presence of intraocular lens: Secondary | ICD-10-CM | POA: Diagnosis not present

## 2016-11-14 DIAGNOSIS — H26493 Other secondary cataract, bilateral: Secondary | ICD-10-CM | POA: Diagnosis not present

## 2016-11-14 DIAGNOSIS — H04129 Dry eye syndrome of unspecified lacrimal gland: Secondary | ICD-10-CM | POA: Diagnosis not present

## 2016-11-15 ENCOUNTER — Telehealth: Payer: Self-pay | Admitting: Family Medicine

## 2016-11-15 NOTE — Telephone Encounter (Signed)
yes

## 2016-11-15 NOTE — Telephone Encounter (Signed)
Please review-aa 

## 2016-11-15 NOTE — Telephone Encounter (Signed)
[place] called wanting to make sure Dr, Rosanna Randy knows that Dr. Manuella Ghazi has Mrs. Morgan Dennis on Aricept 10 mg and Dr. Rosanna Randy has her Namenda 5mg .   These both treat dementia.  Pharmacy said it was ok to take both but just wanted Dr. Rosanna Randy know.   Thank sTeri

## 2016-12-04 ENCOUNTER — Ambulatory Visit (INDEPENDENT_AMBULATORY_CARE_PROVIDER_SITE_OTHER): Payer: Medicare HMO | Admitting: Family Medicine

## 2016-12-04 ENCOUNTER — Ambulatory Visit: Payer: Medicare HMO | Admitting: Family Medicine

## 2016-12-04 VITALS — BP 100/46 | HR 92 | Temp 98.2°F | Resp 14 | Wt 156.0 lb

## 2016-12-04 DIAGNOSIS — M791 Myalgia, unspecified site: Secondary | ICD-10-CM

## 2016-12-04 DIAGNOSIS — M0589 Other rheumatoid arthritis with rheumatoid factor of multiple sites: Secondary | ICD-10-CM | POA: Diagnosis not present

## 2016-12-04 DIAGNOSIS — M0579 Rheumatoid arthritis with rheumatoid factor of multiple sites without organ or systems involvement: Secondary | ICD-10-CM | POA: Diagnosis not present

## 2016-12-04 NOTE — Patient Instructions (Addendum)
Patient to use support hose for leg pain.  Tylenol as needed.  Stop Lovastatin.

## 2016-12-04 NOTE — Progress Notes (Signed)
Morgan Dennis  MRN: [id number] DOB: 12-15-1934  Subjective:  HPI   The patient is an 82 year old female who presents for follow up of her cognition.  She reports that she thinks her memory is the same.  She does not think it has gotten any worse.  She thinks that the medicine has helped some.    The patient also reports having leg pain bilaterally with the right one being worse.  She states she is on her feet more now with getting ready for Christmas and thinks this has something to do with it hurting more.  It is more painful at night according to the patient.  She has not tried to do anything to alleviate the pain and reports that it has kept her awake some at night.  Patient Active Problem List   Diagnosis Date Noted  . Frequency of urination and polyuria 10/12/2015  . Upper GI bleed 08/18/2015  . Adaptation reaction 04/30/2015  . Absolute anemia 04/30/2015  . Acid reflux 04/30/2015  . HLD (hyperlipidemia) 04/30/2015  . Malaise and fatigue 04/30/2015  . Affective disorder, major 04/30/2015  . Bad memory 04/30/2015  . Mild cognitive disorder 04/30/2015  . Fungal infection of toenail 04/30/2015  . Arthritis, degenerative 04/30/2015  . OP (osteoporosis) 04/30/2015  . Arthritis or polyarthritis, rheumatoid ([place]) 04/30/2015  . Avitaminosis D 05/12/2014  . Rheumatoid arthritis ([place]) 03/17/2014    Past Medical History:  Diagnosis Date  . Arthritis   . GERD (gastroesophageal reflux disease)     Social History   Social History  . Marital status: Married    Spouse name: Percell Miller  . Number of children: 1  . Years of education: 70   Occupational History  . retired    Social History Main Topics  . Smoking status: Never Smoker  . Smokeless tobacco: Never Used  . Alcohol use No  . Drug use: No  . Sexual activity: No   Other Topics Concern  . Not on file   Social History Narrative  . No narrative on file    Patient does not have her medication with her today but states  she is on all of the following medication.  Outpatient Encounter Prescriptions as of 12/04/2016  Medication Sig Note  . abatacept (ORENCIA) 250 MG injection Inject into the skin. 07/19/2016: Received from: [place]: Inject 125 mg subcutaneously every 7 (seven) days.  Marland Kitchen acetaminophen (TYLENOL) 325 MG tablet Take 2 tablets (650 mg total) by mouth every 6 (six) hours as needed for mild pain (or Fever >/= 101).   Marland Kitchen ascorbic acid (VITAMIN C) 1000 MG tablet Take 1,000 mg by mouth daily.   . Calcium Carb-Cholecalciferol 600-400 MG-UNIT TABS Take 1 tablet by mouth 2 (two) times daily with a meal.   . Cholecalciferol (VITAMIN D3) 2000 UNITS TABS Take 1 tablet by mouth daily.   Marland Kitchen donepezil (ARICEPT) 5 MG tablet Take 1 tablet (5 mg total) by mouth at bedtime.   . DULoxetine (CYMBALTA) 60 MG capsule Take 60 mg by mouth daily.   . folic acid (FOLVITE) 1 MG tablet Take 1 mg by mouth daily.   Marland Kitchen gabapentin (NEURONTIN) 300 MG capsule Take 1 capsule by mouth 3 (three) times daily.  06/10/2015: Received from: External Pharmacy Received Sig:   . lovastatin (MEVACOR) 20 MG tablet Take 1 tablet (20 mg total) by mouth at bedtime.   . memantine (NAMENDA) 5 MG tablet Take 1 tablet (5 mg total)  by mouth 2 (two) times daily.   . methotrexate 2.5 MG tablet Take 6 tablets by mouth once a week.  04/30/2015: Received from: [place]:   . MULTIPLE VITAMIN PO Take 1 tablet by mouth daily. 04/30/2015: Received from: [place]:   . Phosphatidylserine-DHA-EPA 100-19.5-6.5 MG CAPS Take 1 tablet by mouth daily.   . solifenacin (VESICARE) 5 MG tablet Take 1 tablet (5 mg total) by mouth daily.   . vitamin E 400 UNIT capsule Take 400 Units by mouth daily.   . [DISCONTINUED] nystatin cream (MYCOSTATIN) Apply 1 application topically 2 (two) times daily.    No facility-administered encounter medications on file as of 12/04/2016.      Allergies  Allergen Reactions  . Evista  [Raloxifene]     Other reaction(s): Joint Pains  . Ibandronic Acid     Other reaction(s): Muscle Pain  . Penicillins     Other reaction(s): Rash  . Risedronate Sodium     Other reaction(s): Joint Pains    Review of Systems  Constitutional: Negative for fever and malaise/fatigue.  Eyes: Negative.   Respiratory: Negative for cough, shortness of breath and wheezing.   Cardiovascular: Negative for chest pain, palpitations, orthopnea, claudication and leg swelling.  Musculoskeletal: Positive for myalgias (legs). Negative for falls and joint pain.  Skin: Negative.   Neurological: Negative for dizziness, weakness and headaches.  Endo/Heme/Allergies: Negative.   Psychiatric/Behavioral: Negative.     Objective:  BP (!) 100/46 (BP Location: Right Arm, Patient Position: Sitting, Cuff Size: Normal)   Pulse 92   Temp 98.2 F (36.8 C) (Oral)   Resp 14   Wt 156 lb (70.8 kg)   BMI 26.78 kg/m   Physical Exam  Constitutional: She is oriented to person, place, and time and well-developed, well-nourished, and in no distress.  HENT:  Head: Normocephalic and atraumatic.  Right Ear: External ear normal.  Left Ear: External ear normal.  Nose: Nose normal.  Eyes: Conjunctivae are normal. Pupils are equal, round, and reactive to light. No scleral icterus.  Neck: Normal range of motion. No thyromegaly present.  Cardiovascular: Normal rate, regular rhythm and normal heart sounds.   Pulmonary/Chest: Effort normal and breath sounds normal.  Abdominal: Soft.  Musculoskeletal:  Pain greater in right calf   Neurological: She is alert and oriented to person, place, and time.  Skin: Skin is warm and dry.  Psychiatric: Mood and affect normal.  No swelling. Negative Homans, no cords.  Assessment and Plan :   1. Myalgia  Patient is instructed to use support hose and Tylenol for comfort.  She is also instructed to discontinue her Lovastatin and see if  this could be a contributing factor. 2.MCI MMSE next visit. 3.Neuropathy 4.Rheumatoid Arthritis 5.OA   HPI, Exam and A&P Transcribed under the direction and in the presence of [name]., MD. Electronically Signed: Althea Charon, RMA I have done the exam and reviewed the chart and it is accurate to the best of my knowledge. Development worker, community has been used and  any errors in dictation or transcription are unintentional. Miguel Aschoff M.D. [facility]

## 2017-01-01 DIAGNOSIS — R251 Tremor, unspecified: Secondary | ICD-10-CM | POA: Diagnosis not present

## 2017-01-01 DIAGNOSIS — M79672 Pain in left foot: Secondary | ICD-10-CM | POA: Diagnosis not present

## 2017-01-01 DIAGNOSIS — J209 Acute bronchitis, unspecified: Secondary | ICD-10-CM | POA: Diagnosis not present

## 2017-01-01 DIAGNOSIS — M0579 Rheumatoid arthritis with rheumatoid factor of multiple sites without organ or systems involvement: Secondary | ICD-10-CM | POA: Diagnosis not present

## 2017-01-02 ENCOUNTER — Ambulatory Visit
Admission: RE | Admit: 2017-01-02 | Discharge: 2017-01-02 | Disposition: A | Discharge status: Home / Self Care | Payer: Medicare HMO | Source: Ambulatory Visit | Attending: Family Medicine | Admitting: Family Medicine

## 2017-01-02 ENCOUNTER — Encounter: Payer: Self-pay | Admitting: Family Medicine

## 2017-01-02 ENCOUNTER — Ambulatory Visit (INDEPENDENT_AMBULATORY_CARE_PROVIDER_SITE_OTHER): Payer: Medicare HMO | Admitting: Family Medicine

## 2017-01-02 VITALS — BP 124/66 | HR 86 | Temp 98.7°F | Resp 16 | Wt 155.0 lb

## 2017-01-02 DIAGNOSIS — F09 Unspecified mental disorder due to known physiological condition: Secondary | ICD-10-CM | POA: Diagnosis not present

## 2017-01-02 DIAGNOSIS — R059 Cough, unspecified: Secondary | ICD-10-CM

## 2017-01-02 DIAGNOSIS — R05 Cough: Secondary | ICD-10-CM

## 2017-01-02 DIAGNOSIS — J418 Mixed simple and mucopurulent chronic bronchitis: Secondary | ICD-10-CM | POA: Diagnosis not present

## 2017-01-02 DIAGNOSIS — J42 Unspecified chronic bronchitis: Secondary | ICD-10-CM | POA: Insufficient documentation

## 2017-01-02 DIAGNOSIS — M791 Myalgia, unspecified site: Secondary | ICD-10-CM

## 2017-01-02 DIAGNOSIS — R69 Illness, unspecified: Secondary | ICD-10-CM | POA: Diagnosis not present

## 2017-01-02 MED ORDER — DOXYCYCLINE HYCLATE 100 MG PO TABS: 100.0000 mg | ORAL_TABLET | Freq: Two times a day (BID) | ORAL | 0 refills | Status: DC

## 2017-01-02 MED ORDER — FISH OIL 1000 MG PO CAPS: 1.0000 | ORAL_CAPSULE | Freq: Every day | ORAL | 0 refills | Status: DC

## 2017-01-02 NOTE — Progress Notes (Signed)
Patient: Morgan Dennis Female    DOB: 01-Feb-1934   83 y.o.   MRN: [id number] Visit Date: 01/02/2017  Today's Provider: [name], MD   Chief Complaint  Patient presents with  . Myalgia  . Mild Cognitive Impairment   Subjective:    HPI     Follow up for Myalgia  The patient was last seen for this 4 weeks ago. Changes made at last visit include use support hose and Tylenol, as well as D/C Lovastatin.  She reports good compliance with treatment. She feels that condition is Improved.  ------------------------------------------------------------------------------------ [facility] Pt reports this is unchanged. Pt is concerned because she was informed that [facility] "stopped making" her Vayacog. MMSE - Mini Mental State Exam 01/02/2017  Orientation to time 3  Orientation to Place 5  Registration 3  Attention/ Calculation 4  Recall 0  Language- name 2 objects 2  Language- repeat 1  Language- follow 3 step command 3  Language- read & follow direction 1  Write a sentence 1  Copy design 1  Total score 24    Cough Pt reports she has had a cough for 3-4 days. Is productive of white sputum. Pt was not able to get orencia injection yesterday due to cough. Dr. Jefm Bryant advised pt to start Mucinex. Pt has not started this. No fever or chills or hemoptysis but she does have a deep cough today. Allergies  Allergen Reactions  . Evista  [Raloxifene]     Other reaction(s): Joint Pains  . Ibandronic Acid     Other reaction(s): Muscle Pain  . Penicillins     Other reaction(s): Rash  . Risedronate Sodium     Other reaction(s): Joint Pains     Current Outpatient Prescriptions:  .  abatacept (ORENCIA) 250 MG injection, Inject into the skin., Disp: , Rfl:  .  acetaminophen (TYLENOL) 325 MG tablet, Take 2 tablets (650 mg total) by mouth every 6 (six) hours as needed for mild pain (or Fever >/= 101)., Disp: , Rfl:  .  ascorbic acid (VITAMIN C) 1000 MG tablet, Take 1,000 mg by  mouth daily., Disp: , Rfl:  .  Calcium Carb-Cholecalciferol 600-400 MG-UNIT TABS, Take 1 tablet by mouth 2 (two) times daily with a meal., Disp: , Rfl:  .  Cholecalciferol (VITAMIN D3) 2000 UNITS TABS, Take 1 tablet by mouth daily., Disp: , Rfl:  .  Docusate Sodium (DSS) 100 MG CAPS, TAKE ONE (1) CAPSULE BY MOUTH 2 TIMES DAILY, Disp: , Rfl:  .  donepezil (ARICEPT) 5 MG tablet, Take 1 tablet (5 mg total) by mouth at bedtime., Disp: 30 tablet, Rfl: 12 .  DULoxetine (CYMBALTA) 60 MG capsule, Take 60 mg by mouth daily., Disp: , Rfl:  .  folic acid (FOLVITE) 1 MG tablet, Take 1 mg by mouth daily., Disp: , Rfl:  .  gabapentin (NEURONTIN) 300 MG capsule, Take 1 capsule by mouth 3 (three) times daily. , Disp: , Rfl:  .  memantine (NAMENDA) 5 MG tablet, Take 1 tablet (5 mg total) by mouth 2 (two) times daily., Disp: 60 tablet, Rfl: 5 .  methotrexate 2.5 MG tablet, Take 6 tablets by mouth once a week. , Disp: , Rfl:  .  MULTIPLE VITAMIN PO, Take 1 tablet by mouth daily., Disp: , Rfl:  .  Phosphatidylserine-DHA-EPA 100-19.5-6.5 MG CAPS, Take 1 tablet by mouth daily., Disp: 30 capsule, Rfl: 12 .  solifenacin (VESICARE) 5 MG tablet, Take 1 tablet (5 mg total)  by mouth daily., Disp: 30 tablet, Rfl: 12 .  traMADol (ULTRAM) 50 MG tablet, Take by mouth 2 (two) times daily., Disp: , Rfl:  .  vitamin E 400 UNIT capsule, Take 400 Units by mouth daily., Disp: , Rfl:   Review of Systems  Constitutional: Negative for activity change, appetite change, chills, diaphoresis, fatigue, fever and unexpected weight change.  HENT: Negative.   Eyes: Negative.   Respiratory: Positive for cough. Negative for chest tightness and shortness of breath.   Cardiovascular: Negative for chest pain, palpitations and leg swelling.  Endocrine: Negative.   Genitourinary: Negative.   Musculoskeletal: Negative for myalgias.  Allergic/Immunologic: Negative.   Neurological: Positive for tremors.  Psychiatric/Behavioral: Positive for  confusion.    Social History  Substance Use Topics  . Smoking status: Never Smoker  . Smokeless tobacco: Never Used  . Alcohol use No   Objective:   BP 124/66 (BP Location: Right Arm, Patient Position: Sitting, Cuff Size: Normal)   Pulse 86   Temp 98.7 F (37.1 C) (Oral)   Resp 16   Wt 155 lb (70.3 kg)   SpO2 92%   BMI 26.61 kg/m   Physical Exam  Constitutional: She is oriented to person, place, and time. She appears well-developed and well-nourished.  HENT:  Head: Normocephalic and atraumatic.  Right Ear: External ear normal.  Left Ear: External ear normal.  Nose: Nose normal. Right sinus exhibits no maxillary sinus tenderness and no frontal sinus tenderness. Left sinus exhibits no maxillary sinus tenderness and no frontal sinus tenderness.  Mouth/Throat: Oropharynx is clear and moist.  Eyes: Conjunctivae are normal. No scleral icterus.  Neck: No thyromegaly present.  Cardiovascular: Normal rate and regular rhythm.   Murmur (harsh) heard.  Systolic murmur is present with a grade of 2/6  Pulmonary/Chest: Effort normal and breath sounds normal. No respiratory distress.  Abdominal: Soft.  Musculoskeletal: She exhibits no edema.  Neurological: She is alert and oriented to person, place, and time.  Skin: Skin is warm and dry.  Psychiatric: She has a normal mood and affect. Her behavior is normal. Judgment and thought content normal.        Assessment & Plan:     1. Mixed simple and mucopurulent chronic bronchitis (HCC) FU CXR. Start abx as below. - DG Chest 2 View - doxycycline (VIBRA-TABS) 100 MG tablet; Take 1 tablet (100 mg total) by mouth 2 (two) times daily.  Dispense: 14 tablet; Refill: 0  2. Cough See plan above.  3. Mild cognitive disorder/Early dementia Vyacog has been discontinued. Will start OTC fish oil. - Omega-3 Fatty Acids (FISH OIL) 1000 MG CAPS; Take 1 capsule (1,000 mg total) by mouth daily.; Refill: 0  4. Myalgia Resolved. Stay off of  statin 5. Hyperlipidemia Check labs on next visit 6. Rheumatoid arthritis and osteoarthritis    Patient seen and examined by Miguel Aschoff, MD, and note scribed by Renaldo Fiddler, CMA. I have done the exam and reviewed the above chart and it is accurate to the best of my knowledge. Development worker, community has been used in this note in any air is in the dictation or transcription are unintentional. I have done the exam and reviewed the above chart and it is accurate to the best of my knowledge. Development worker, community has been used in this note in any air is in the dictation or transcription are unintentional.  [name], MD  [place]

## 2017-01-02 NOTE — Patient Instructions (Signed)
Start Over the [employer] instead of [place]. Go across the street to get chest xray.

## 2017-01-05 NOTE — Progress Notes (Signed)
Patient advised  ED 

## 2017-01-08 NOTE — Progress Notes (Signed)
Advised  ED 

## 2017-01-17 DIAGNOSIS — R413 Other amnesia: Secondary | ICD-10-CM | POA: Diagnosis not present

## 2017-01-17 DIAGNOSIS — R2689 Other abnormalities of gait and mobility: Secondary | ICD-10-CM | POA: Diagnosis not present

## 2017-01-17 DIAGNOSIS — R251 Tremor, unspecified: Secondary | ICD-10-CM | POA: Diagnosis not present

## 2017-01-17 DIAGNOSIS — G629 Polyneuropathy, unspecified: Secondary | ICD-10-CM | POA: Diagnosis not present

## 2017-01-23 DIAGNOSIS — H35373 Puckering of macula, bilateral: Secondary | ICD-10-CM | POA: Diagnosis not present

## 2017-01-23 DIAGNOSIS — H26491 Other secondary cataract, right eye: Secondary | ICD-10-CM | POA: Diagnosis not present

## 2017-01-23 DIAGNOSIS — H00025 Hordeolum internum left lower eyelid: Secondary | ICD-10-CM | POA: Diagnosis not present

## 2017-01-23 DIAGNOSIS — H35033 Hypertensive retinopathy, bilateral: Secondary | ICD-10-CM | POA: Diagnosis not present

## 2017-01-23 DIAGNOSIS — Z961 Presence of intraocular lens: Secondary | ICD-10-CM | POA: Diagnosis not present

## 2017-01-23 DIAGNOSIS — H26492 Other secondary cataract, left eye: Secondary | ICD-10-CM | POA: Diagnosis not present

## 2017-01-25 ENCOUNTER — Encounter: Payer: Self-pay | Admitting: Family Medicine

## 2017-01-29 DIAGNOSIS — M0589 Other rheumatoid arthritis with rheumatoid factor of multiple sites: Secondary | ICD-10-CM | POA: Diagnosis not present

## 2017-01-29 DIAGNOSIS — M0579 Rheumatoid arthritis with rheumatoid factor of multiple sites without organ or systems involvement: Secondary | ICD-10-CM | POA: Diagnosis not present

## 2017-02-12 ENCOUNTER — Encounter: Payer: Self-pay | Admitting: Family Medicine

## 2017-02-12 ENCOUNTER — Ambulatory Visit (INDEPENDENT_AMBULATORY_CARE_PROVIDER_SITE_OTHER): Payer: Medicare HMO | Admitting: Family Medicine

## 2017-02-12 VITALS — BP 118/64 | HR 109 | Temp 97.9°F | Resp 16 | Wt 152.0 lb

## 2017-02-12 DIAGNOSIS — C4490 Unspecified malignant neoplasm of skin, unspecified: Secondary | ICD-10-CM | POA: Insufficient documentation

## 2017-02-12 DIAGNOSIS — J069 Acute upper respiratory infection, unspecified: Secondary | ICD-10-CM

## 2017-02-12 DIAGNOSIS — K219 Gastro-esophageal reflux disease without esophagitis: Secondary | ICD-10-CM

## 2017-02-12 DIAGNOSIS — M069 Rheumatoid arthritis, unspecified: Secondary | ICD-10-CM | POA: Diagnosis not present

## 2017-02-12 MED ORDER — OMEPRAZOLE 20 MG PO CPDR: 20.0000 mg | DELAYED_RELEASE_CAPSULE | Freq: Every day | ORAL | 3 refills | Status: DC

## 2017-02-12 MED ORDER — DM-GUAIFENESIN ER 30-600 MG PO TB12: 1.0000 | ORAL_TABLET | Freq: Two times a day (BID) | ORAL | 0 refills | Status: DC
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
## 2017-02-13 ENCOUNTER — Telehealth: Payer: Self-pay

## 2017-02-13 LAB — COMPREHENSIVE METABOLIC PANEL
ALT: 7 IU/L (ref 0–32)
AST: 25 IU/L (ref 0–40)
Albumin/Globulin Ratio: 1.7 (ref 1.2–2.2)
Albumin: 4.7 g/dL (ref 3.5–4.7)
Alkaline Phosphatase: 84 IU/L (ref 39–117)
BUN/Creatinine Ratio: 14 (ref 12–28)
BUN: 12 mg/dL (ref 8–27)
Bilirubin Total: 0.4 mg/dL (ref 0.0–1.2)
CALCIUM: 9.8 mg/dL (ref 8.7–10.3)
CO2: 27 mmol/L (ref 18–29)
CREATININE: 0.85 mg/dL (ref 0.57–1.00)
Chloride: 98 mmol/L (ref 96–106)
GFR calc Af Amer: 75 mL/min/{1.73_m2} (ref >59)
GFR, EST NON AFRICAN AMERICAN: 65 mL/min/{1.73_m2} (ref >59)
Globulin, Total: 2.7 g/dL (ref 1.5–4.5)
Glucose: 139 mg/dL — ABNORMAL HIGH (ref 65–99)
Potassium: 4.1 mmol/L (ref 3.5–5.2)
Sodium: 142 mmol/L (ref 134–144)
Total Protein: 7.4 g/dL (ref 6.0–8.5)

## 2017-02-13 LAB — CBC WITH DIFFERENTIAL/PLATELET
Basophils Absolute: 0 10*3/uL (ref 0.0–0.2)
Basos: 0 %
EOS (ABSOLUTE): 0.1 10*3/uL (ref 0.0–0.4)
EOS: 1 %
Hematocrit: 43 % (ref 34.0–46.6)
Hemoglobin: 14.5 g/dL (ref 11.1–15.9)
IMMATURE GRANULOCYTES: 0 %
Immature Grans (Abs): 0 10*3/uL (ref 0.0–0.1)
Lymphocytes Absolute: 2.2 10*3/uL (ref 0.7–3.1)
Lymphs: 29 %
MCH: 32.6 pg (ref 26.6–33.0)
MCHC: 33.7 g/dL (ref 31.5–35.7)
MCV: 97 fL (ref 79–97)
MONOS ABS: 0.8 10*3/uL (ref 0.1–0.9)
Monocytes: 10 %
NEUTROS PCT: 60 %
Neutrophils Absolute: 4.4 10*3/uL (ref 1.4–7.0)
PLATELETS: 201 10*3/uL (ref 150–379)
RBC: 4.45 x10E6/uL (ref 3.77–5.28)
RDW: 14.4 % (ref 12.3–15.4)
WBC: 7.5 10*3/uL (ref 3.4–10.8)

## 2017-02-13 NOTE — Telephone Encounter (Signed)
Patient's husband advised 

## 2017-02-13 NOTE — Telephone Encounter (Signed)
-----   Message from [place], [place] sent at 02/13/2017  3:05 PM EST ----- All blood tests normal except glucose elevated (don't think this was a fasting test). Recheck levels in 3 months.

## 2017-02-14 DIAGNOSIS — R413 Other amnesia: Secondary | ICD-10-CM | POA: Diagnosis not present

## 2017-02-14 DIAGNOSIS — G20 Parkinson's disease: Secondary | ICD-10-CM | POA: Diagnosis not present

## 2017-02-14 DIAGNOSIS — G629 Polyneuropathy, unspecified: Secondary | ICD-10-CM | POA: Diagnosis not present

## 2017-02-15 DIAGNOSIS — H26492 Other secondary cataract, left eye: Secondary | ICD-10-CM | POA: Diagnosis not present

## 2017-02-19 DIAGNOSIS — R69 Illness, unspecified: Secondary | ICD-10-CM | POA: Diagnosis not present

## 2017-02-19 DIAGNOSIS — Z79899 Other long term (current) drug therapy: Secondary | ICD-10-CM | POA: Diagnosis not present

## 2017-02-26 DIAGNOSIS — M0589 Other rheumatoid arthritis with rheumatoid factor of multiple sites: Secondary | ICD-10-CM | POA: Diagnosis not present

## 2017-03-12 ENCOUNTER — Other Ambulatory Visit: Payer: Self-pay

## 2017-03-12 MED ORDER — MEMANTINE HCL 5 MG PO TABS: 5.0000 mg | ORAL_TABLET | Freq: Two times a day (BID) | ORAL | 3 refills | Status: DC

## 2017-03-26 DIAGNOSIS — M0579 Rheumatoid arthritis with rheumatoid factor of multiple sites without organ or systems involvement: Secondary | ICD-10-CM | POA: Diagnosis not present

## 2017-03-26 DIAGNOSIS — M0589 Other rheumatoid arthritis with rheumatoid factor of multiple sites: Secondary | ICD-10-CM | POA: Diagnosis not present

## 2017-04-02 DIAGNOSIS — L820 Inflamed seborrheic keratosis: Secondary | ICD-10-CM | POA: Diagnosis not present

## 2017-04-02 DIAGNOSIS — D0471 Carcinoma in situ of skin of right lower limb, including hip: Secondary | ICD-10-CM | POA: Diagnosis not present

## 2017-04-02 DIAGNOSIS — Z85828 Personal history of other malignant neoplasm of skin: Secondary | ICD-10-CM | POA: Diagnosis not present

## 2017-04-02 DIAGNOSIS — D485 Neoplasm of uncertain behavior of skin: Secondary | ICD-10-CM | POA: Diagnosis not present

## 2017-04-11 DIAGNOSIS — G20 Parkinson's disease: Secondary | ICD-10-CM | POA: Diagnosis not present

## 2017-04-11 DIAGNOSIS — G4752 REM sleep behavior disorder: Secondary | ICD-10-CM | POA: Diagnosis not present

## 2017-04-11 DIAGNOSIS — R413 Other amnesia: Secondary | ICD-10-CM | POA: Diagnosis not present

## 2017-04-11 DIAGNOSIS — G629 Polyneuropathy, unspecified: Secondary | ICD-10-CM | POA: Diagnosis not present

## 2017-04-19 ENCOUNTER — Other Ambulatory Visit: Payer: Self-pay | Admitting: Family Medicine

## 2017-04-19 DIAGNOSIS — K219 Gastro-esophageal reflux disease without esophagitis: Secondary | ICD-10-CM

## 2017-04-23 DIAGNOSIS — G20 Parkinson's disease: Secondary | ICD-10-CM | POA: Insufficient documentation

## 2017-04-23 DIAGNOSIS — M0579 Rheumatoid arthritis with rheumatoid factor of multiple sites without organ or systems involvement: Secondary | ICD-10-CM | POA: Diagnosis not present

## 2017-04-23 DIAGNOSIS — M0589 Other rheumatoid arthritis with rheumatoid factor of multiple sites: Secondary | ICD-10-CM | POA: Diagnosis not present

## 2017-04-23 DIAGNOSIS — G5791 Unspecified mononeuropathy of right lower limb: Secondary | ICD-10-CM | POA: Diagnosis not present

## 2017-04-23 DIAGNOSIS — G5792 Unspecified mononeuropathy of left lower limb: Secondary | ICD-10-CM | POA: Diagnosis not present

## 2017-04-24 DIAGNOSIS — D0471 Carcinoma in situ of skin of right lower limb, including hip: Secondary | ICD-10-CM | POA: Diagnosis not present

## 2017-04-24 DIAGNOSIS — L820 Inflamed seborrheic keratosis: Secondary | ICD-10-CM | POA: Diagnosis not present

## 2017-05-03 ENCOUNTER — Ambulatory Visit: Payer: Medicare HMO | Admitting: Family Medicine

## 2017-05-03 ENCOUNTER — Ambulatory Visit (INDEPENDENT_AMBULATORY_CARE_PROVIDER_SITE_OTHER): Payer: Medicare HMO | Admitting: Family Medicine

## 2017-05-03 VITALS — BP 116/62 | HR 78 | Temp 97.7°F | Resp 12 | Wt 154.0 lb

## 2017-05-03 DIAGNOSIS — F09 Unspecified mental disorder due to known physiological condition: Secondary | ICD-10-CM

## 2017-05-03 DIAGNOSIS — R69 Illness, unspecified: Secondary | ICD-10-CM | POA: Diagnosis not present

## 2017-05-03 DIAGNOSIS — E784 Other hyperlipidemia: Secondary | ICD-10-CM | POA: Diagnosis not present

## 2017-05-03 DIAGNOSIS — F320 Major depressive disorder, single episode, mild: Secondary | ICD-10-CM

## 2017-05-03 DIAGNOSIS — G629 Polyneuropathy, unspecified: Secondary | ICD-10-CM

## 2017-05-03 DIAGNOSIS — M069 Rheumatoid arthritis, unspecified: Secondary | ICD-10-CM

## 2017-05-03 DIAGNOSIS — G20 Parkinson's disease: Secondary | ICD-10-CM

## 2017-05-03 DIAGNOSIS — K219 Gastro-esophageal reflux disease without esophagitis: Secondary | ICD-10-CM

## 2017-05-03 DIAGNOSIS — E7849 Other hyperlipidemia: Secondary | ICD-10-CM

## 2017-05-03 NOTE — Progress Notes (Signed)
Morgan Dennis  MRN: [id number] DOB: [birth year]  Subjective:  HPI  Patient is here for follow up. Patient saw Vernie Murders on 02/12/17 for URI, also at that time GERD was discussed and patient was re started on Omeprazole due to nausea flare ups she was having. Symptoms are better after re starting on Omeprazole. On 01/02/17 patient was seen and memory impairment was addressed. MMSE then was 24/30 and today it is 22/30. Vayacog was discontinued by the pharmacy manufacturer and patient was advised to take Fish oil daily. Patient is taking Namenda and Aricept also. Patient states her memory is worsening-short term is a little worse per patient's husband. Patient has been seen Dr Linus Orn neurologist and has parkinson's now and is on Carbidopa-levodopa. Also Melatonin was added for sleep. Dr Linus Orn increased Gabapentin to help with neuropathy in her feet. She has constant pain. Hyperlipidemia: wanted to re check her lipid level on this visit, patient is off medication due to having myalgia issues while on the medication. Lab Results  Component Value Date   CHOL 222 (H) 09/20/2016   HDL 39 (L) 09/20/2016   LDLCALC 153 (H) 09/20/2016   TRIG 148 09/20/2016   Patient is having pain in her legs, feet and is seen Dr Jefm Bryant for this.  Patient Active Problem List   Diagnosis Date Noted  . Parkinson's disease ([place]) 04/23/2017  . Skin cancer 02/12/2017  . Chronic bronchitis ([place]) 01/02/2017  . Frequency of urination and polyuria 10/12/2015  . Upper GI bleed 08/18/2015  . Adaptation reaction 04/30/2015  . Absolute anemia 04/30/2015  . Acid reflux 04/30/2015  . HLD (hyperlipidemia) 04/30/2015  . Malaise and fatigue 04/30/2015  . Affective disorder, major 04/30/2015  . Bad memory 04/30/2015  . Mild cognitive disorder 04/30/2015  . Fungal infection of toenail 04/30/2015  . Arthritis, degenerative 04/30/2015  . OP (osteoporosis) 04/30/2015  . Arthritis or polyarthritis, rheumatoid ([place]) 04/30/2015  .  Avitaminosis D 05/12/2014  . Trigger finger 05/12/2014  . Rheumatoid arthritis ([place]) 03/17/2014    Past Medical History:  Diagnosis Date  . Arthritis   . GERD (gastroesophageal reflux disease)     Social History   Social History  . Marital status: Married    Spouse name: Percell Miller  . Number of children: 1  . Years of education: 2   Occupational History  . retired    Social History Main Topics  . Smoking status: Never Smoker  . Smokeless tobacco: Never Used  . Alcohol use No  . Drug use: No  . Sexual activity: No   Other Topics Concern  . Not on file   Social History Narrative  . No narrative on file    Outpatient Encounter Prescriptions as of 05/03/2017  Medication Sig Note  . abatacept (ORENCIA) 250 MG injection Inject into the skin. 07/19/2016: Received from: [facility]: Inject 125 mg subcutaneously every 7 (seven) days.  Marland Kitchen acetaminophen (TYLENOL) 325 MG tablet Take 2 tablets (650 mg total) by mouth every 6 (six) hours as needed for mild pain (or Fever >/= 101).   Marland Kitchen ascorbic acid (VITAMIN C) 1000 MG tablet Take 1,000 mg by mouth daily.   . Calcium Carb-Cholecalciferol 600-400 MG-UNIT TABS Take 1 tablet by mouth 2 (two) times daily with a meal.   . carbidopa-levodopa (SINEMET IR) 25-100 MG tablet Take 1 tablet by mouth daily.   . Cholecalciferol (VITAMIN D3) 2000 UNITS TABS Take 1 tablet by mouth daily.   Marland Kitchen dextromethorphan-guaiFENesin [facility]  DM) 30-600 MG 12hr tablet Take 1 tablet by mouth 2 (two) times daily.   Mariane Baumgarten Sodium (DSS) 100 MG CAPS TAKE ONE (1) CAPSULE BY MOUTH 2 TIMES DAILY 01/02/2017: Received from: [facility]  . donepezil (ARICEPT) 10 MG tablet Take 1 tablet by mouth daily.   . DULoxetine (CYMBALTA) 60 MG capsule Take 60 mg by mouth daily.   . folic acid (FOLVITE) 1 MG tablet Take 1 mg by mouth daily.   Marland Kitchen gabapentin (NEURONTIN) 300 MG capsule Take 1 capsule by mouth. 2 in the morning, 2 at lunch and 3  in the evening. 06/10/2015: Received from: External Pharmacy Received Sig:   . Melatonin 5 MG TABS Take by mouth at bedtime.   . memantine (NAMENDA) 5 MG tablet Take 1 tablet (5 mg total) by mouth 2 (two) times daily.   . methotrexate 2.5 MG tablet Take 6 tablets by mouth once a week.  04/30/2015: Received from: [facility]:   . MULTIPLE VITAMIN PO Take 1 tablet by mouth daily. 04/30/2015: Received from: [facility]:   . Omega-3 Fatty Acids (FISH OIL) 1000 MG CAPS Take 1 capsule (1,000 mg total) by mouth daily.   Marland Kitchen omeprazole (PRILOSEC) 20 MG capsule TAKE ONE CAPSULE BY MOUTH DAILY   . solifenacin (VESICARE) 5 MG tablet Take 1 tablet (5 mg total) by mouth daily.   . traMADol (ULTRAM) 50 MG tablet Take by mouth 2 (two) times daily.   . vitamin E 400 UNIT capsule Take 400 Units by mouth daily.   . [DISCONTINUED] donepezil (ARICEPT) 5 MG tablet Take 1 tablet (5 mg total) by mouth at bedtime.    No facility-administered encounter medications on file as of 05/03/2017.     Allergies  Allergen Reactions  . Evista  [Raloxifene]     Other reaction(s): Joint Pains  . Ibandronic Acid     Other reaction(s): Muscle Pain  . Penicillins     Other reaction(s): Rash  . Risedronate Sodium     Other reaction(s): Joint Pains    Review of Systems  Constitutional: Positive for malaise/fatigue.  Respiratory: Negative.   Cardiovascular: Negative.   Gastrointestinal: Negative.        Better on Omeprazole.  Musculoskeletal: Positive for joint pain. Negative for back pain and falls.       Feet and leg pain. Neck stiffness  Neurological: Positive for tremors (sometimes.).       Unsteady gait,  Psychiatric/Behavioral: Positive for memory loss. The patient does not have insomnia.        Confusion. Memory worsening    Objective:  BP 116/62   Pulse 78   Temp 97.7 F (36.5 C)   Resp 12   Wt 154 lb (69.9 kg)   BMI 26.43 kg/m   Physical Exam    Constitutional: She is oriented to person, place, and time and well-developed, well-nourished, and in no distress.  HENT:  Head: Normocephalic and atraumatic.  Eyes: Conjunctivae are normal. Pupils are equal, round, and reactive to light.  Neck: Normal range of motion. Neck supple.  Cardiovascular: Normal rate, regular rhythm and intact distal pulses.  Exam reveals no gallop.   Murmur heard.  Systolic murmur is present with a grade of 2/6  Soft systolic murmur  Pulmonary/Chest: Effort normal and breath sounds normal. No respiratory distress. She has no wheezes.  Neurological: She is alert and oriented to person, place, and time.   Assessment and Plan :  1. Mild cognitive disorder/Early Dementia MMSE 22/30. Slightly worse. Patient is not driving. Follow.More than 50% of visit spen in counselling with pt/husband.   2. Parkinson's disease ([place]) New. Patient is seen Dr Oma-neurologist for this. Advised patient to use a cane. No falls so far. Now its about quality of life for the patient. Discussed with patient and her husband who is present today.  3. Rheumatoid arthritis involving multiple sites, unspecified rheumatoid factor presence (HCC) Seen Dr Jefm Bryant.  4. Gastroesophageal reflux disease, esophagitis presence not specified Stable-better on Omeprazole.  5. Other hyperlipidemia Discussed treatment options. Patient had myalgia on statins. Will re check on the next visit, this was discussed with patient and her husband today.  6. Major depressive disorder, single episode, mild (HCC) Stable. 7. Neuropathy An issue for patient. Medication adjustment was made by Dr Linus Orn.  HPI, Exam and A&P transcribed by Theressa Millard, RMA under direction and in the presence of Miguel Aschoff, MD. I have done the exam and reviewed the chart and it is accurate to the best of my knowledge. Development worker, community has been used and  any errors in dictation or transcription are  unintentional. Miguel Aschoff M.D. [facility]

## 2017-05-22 DIAGNOSIS — M0579 Rheumatoid arthritis with rheumatoid factor of multiple sites without organ or systems involvement: Secondary | ICD-10-CM | POA: Diagnosis not present

## 2017-06-19 DIAGNOSIS — M0579 Rheumatoid arthritis with rheumatoid factor of multiple sites without organ or systems involvement: Secondary | ICD-10-CM | POA: Diagnosis not present

## 2017-07-17 DIAGNOSIS — M0589 Other rheumatoid arthritis with rheumatoid factor of multiple sites: Secondary | ICD-10-CM | POA: Diagnosis not present

## 2017-07-17 DIAGNOSIS — M0579 Rheumatoid arthritis with rheumatoid factor of multiple sites without organ or systems involvement: Secondary | ICD-10-CM | POA: Diagnosis not present

## 2017-07-18 DIAGNOSIS — 419620001 Death: Secondary | SNOMED CT | POA: Diagnosis not present

## 2017-07-18 DEATH — deceased

## 2017-08-07 ENCOUNTER — Other Ambulatory Visit: Payer: Self-pay | Admitting: Family Medicine

## 2017-08-07 DIAGNOSIS — R358 Other polyuria: Principal | ICD-10-CM

## 2017-08-07 DIAGNOSIS — R350 Frequency of micturition: Secondary | ICD-10-CM

## 2017-08-07 MED ORDER — SOLIFENACIN SUCCINATE 5 MG PO TABS: 5.0000 mg | ORAL_TABLET | Freq: Every day | ORAL | 12 refills | Status: DC

## 2017-08-07 NOTE — Telephone Encounter (Signed)
[facility] faxed a refill request on the following medications:  solifenacin (VESICARE) 5 MG tablet.  Take 1 tablet each day.    [place]

## 2017-08-07 NOTE — Telephone Encounter (Signed)
Last filled 10/12/2015 with a qty of 30 tablets with 12 refills. Patients last office visit was 05/03/2017.

## 2017-08-09 DIAGNOSIS — H0015 Chalazion left lower eyelid: Secondary | ICD-10-CM | POA: Diagnosis not present

## 2017-08-09 DIAGNOSIS — H0012 Chalazion right lower eyelid: Secondary | ICD-10-CM | POA: Diagnosis not present

## 2017-08-14 DIAGNOSIS — M0579 Rheumatoid arthritis with rheumatoid factor of multiple sites without organ or systems involvement: Secondary | ICD-10-CM | POA: Diagnosis not present

## 2017-08-16 ENCOUNTER — Other Ambulatory Visit: Payer: Self-pay | Admitting: Family Medicine

## 2017-08-16 DIAGNOSIS — Z1231 Encounter for screening mammogram for malignant neoplasm of breast: Secondary | ICD-10-CM

## 2017-08-17 DIAGNOSIS — H9190 Unspecified hearing loss, unspecified ear: Secondary | ICD-10-CM | POA: Diagnosis not present

## 2017-08-20 ENCOUNTER — Encounter: Payer: Self-pay | Admitting: Emergency Medicine

## 2017-08-20 ENCOUNTER — Emergency Department: Payer: Medicare HMO

## 2017-08-20 ENCOUNTER — Emergency Department
Admission: EM | Admit: 2017-08-20 | Discharge: 2017-08-20 | Disposition: A | Discharge status: Home / Self Care | Payer: Medicare HMO | Attending: Emergency Medicine | Admitting: Emergency Medicine

## 2017-08-20 DIAGNOSIS — Z85828 Personal history of other malignant neoplasm of skin: Secondary | ICD-10-CM | POA: Diagnosis not present

## 2017-08-20 DIAGNOSIS — M545 Low back pain, unspecified: Secondary | ICD-10-CM

## 2017-08-20 DIAGNOSIS — Y939 Activity, unspecified: Secondary | ICD-10-CM | POA: Diagnosis not present

## 2017-08-20 DIAGNOSIS — G20 Parkinson's disease: Secondary | ICD-10-CM | POA: Insufficient documentation

## 2017-08-20 DIAGNOSIS — Y999 Unspecified external cause status: Secondary | ICD-10-CM | POA: Insufficient documentation

## 2017-08-20 DIAGNOSIS — M069 Rheumatoid arthritis, unspecified: Secondary | ICD-10-CM | POA: Insufficient documentation

## 2017-08-20 DIAGNOSIS — Y929 Unspecified place or not applicable: Secondary | ICD-10-CM | POA: Insufficient documentation

## 2017-08-20 DIAGNOSIS — K5900 Constipation, unspecified: Secondary | ICD-10-CM | POA: Insufficient documentation

## 2017-08-20 DIAGNOSIS — S32010A Wedge compression fracture of first lumbar vertebra, initial encounter for closed fracture: Secondary | ICD-10-CM | POA: Diagnosis not present

## 2017-08-20 DIAGNOSIS — Z79899 Other long term (current) drug therapy: Secondary | ICD-10-CM | POA: Diagnosis not present

## 2017-08-20 DIAGNOSIS — Z96659 Presence of unspecified artificial knee joint: Secondary | ICD-10-CM | POA: Diagnosis not present

## 2017-08-20 DIAGNOSIS — X58XXXA Exposure to other specified factors, initial encounter: Secondary | ICD-10-CM | POA: Insufficient documentation

## 2017-08-20 LAB — URINALYSIS, COMPLETE (UACMP) WITH MICROSCOPIC
BACTERIA UA: NONE SEEN
BILIRUBIN URINE: NEGATIVE
Glucose, UA: NEGATIVE mg/dL
HGB URINE DIPSTICK: NEGATIVE
KETONES UR: NEGATIVE mg/dL
NITRITE: NEGATIVE
PROTEIN: NEGATIVE mg/dL
Specific Gravity, Urine: 1.008 (ref 1.005–1.030)
pH: 6 (ref 5.0–8.0)

## 2017-08-20 MED ORDER — MAGNESIUM CITRATE PO SOLN
1.0000 | Freq: Once | ORAL | Status: AC
  Administered 2017-08-20: 1 via ORAL
  Filled 2017-08-20: qty 296

## 2017-08-20 MED ORDER — GLYCERIN (LAXATIVE) 2.1 G RE SUPP
1.0000 | Freq: Once | RECTAL | Status: AC
  Administered 2017-08-20: 1 via RECTAL
  Filled 2017-08-20: qty 1

## 2017-08-20 MED ORDER — POLYETHYLENE GLYCOL 3350 17 G PO PACK
17.0000 g | PACK | Freq: Every day | ORAL | Status: DC
  Administered 2017-08-20: 17 g via ORAL
  Filled 2017-08-20: qty 1

## 2017-08-20 MED ORDER — TRAMADOL HCL 50 MG PO TABS: 50.0000 mg | ORAL_TABLET | Freq: Two times a day (BID) | ORAL | 0 refills | Status: DC

## 2017-08-20 MED ORDER — TRAMADOL HCL 50 MG PO TABS
50.0000 mg | ORAL_TABLET | Freq: Once | ORAL | Status: AC
  Administered 2017-08-20: 50 mg via ORAL
  Filled 2017-08-20: qty 1

## 2017-08-20 NOTE — ED Notes (Signed)
Pt reports that she is having lower back pain that started last Thursday - pt denies any injury - pt states no BM in a week

## 2017-08-20 NOTE — ED Notes (Signed)
Pt had med size BM - stool was hard round balls
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
## 2017-08-20 NOTE — ED Notes (Signed)
Pt assisted to bathroom but was unable to void - pt assisted back to room and given water to drink

## 2017-08-20 NOTE — ED Notes (Signed)
Pt assisted to toilet to have BM 

## 2017-08-20 NOTE — Discharge Instructions (Signed)
Your exam, labs, and x-rays are essentially normal. Your x-ray did reveal a compression fracture of the 1st lumbar vertebra. This injury is stable, and does not require any intervention, except pain relief. Your x-ray also revealed a moderate stool burden. Continue to take daily Miralax to soften stools. You may another dose of magnesium citrate in 2 days, if needed. Increase your fiber intake daily. Follow-up with your provider for continued symptoms, or return to the ED as needed.

## 2017-08-20 NOTE — ED Triage Notes (Signed)
Low back pain x 4 days, no recent injury.

## 2017-08-24 ENCOUNTER — Ambulatory Visit (INDEPENDENT_AMBULATORY_CARE_PROVIDER_SITE_OTHER): Payer: Medicare HMO | Admitting: Family Medicine

## 2017-08-24 ENCOUNTER — Encounter: Payer: Self-pay | Admitting: Family Medicine

## 2017-08-24 VITALS — BP 124/72 | HR 72 | Temp 98.6°F | Resp 16 | Wt 153.6 lb

## 2017-08-24 DIAGNOSIS — M545 Low back pain, unspecified: Secondary | ICD-10-CM

## 2017-08-24 MED ORDER — PREDNISONE 20 MG PO TABS: ORAL_TABLET | ORAL | 0 refills | Status: DC

## 2017-08-24 NOTE — Patient Instructions (Signed)
May increase tramadol to 3 x day as needed. Continue Miralax daily. If no bowel movement in 3 days add a Dulcolax suppository. If back pain not improving over the next week call for orthopedic referral.

## 2017-08-24 NOTE — Progress Notes (Signed)
Subjective:     Patient ID: Morgan Dennis, female   DOB: [birth year], 83 y.o.   MRN: [id number]  HPI  Chief Complaint  Patient presents with  . Back Pain    Patient comes in office today to addresws right sided back pain radiating down to her right hip, patient describes pain as sharp and constant. Patient was seen at [facility] ER on 08/20/17 and per spouse she was prescribed laxative and Tramadol. Patient reports that medication has not helped with her pain.   Husband reports she has been moving her bowels with the help of Miralax and a stool softener. Currently taking tramadol twice daily with transient relief. Lumbar x-ray from 08/20/17 c/w mild L1 compression fracture deformity and mild anterolisthesis @ L 4-5. Husband reports she is compliant with her R.A. Medication.   Review of Systems     Objective:   Physical Exam  Constitutional: She appears well-developed and well-nourished. She appears distressed (moderate-severe pain when changing positions).  Musculoskeletal:  Muscle strength in lower extremities 5/5. SLR's to 80 degrees without radiation of back pain or increased back pain. No rash noted. Localized to right SI area.       Assessment:    1. Acute right-sided low back pain without sciatica: ? Strain ? sacroiliitis  - predniSONE (DELTASONE) 20 MG tablet; Take one pill twice daily with food  Dispense: 14 tablet; Refill: 0    Plan:    Increase tramadol to 3 x day and add Dulcolax as needed every 3 days for constipation. Continue Miralax. Orthopedic referral if not improving.

## 2017-08-27 ENCOUNTER — Other Ambulatory Visit: Payer: Self-pay | Admitting: Rheumatology

## 2017-08-27 DIAGNOSIS — M0579 Rheumatoid arthritis with rheumatoid factor of multiple sites without organ or systems involvement: Secondary | ICD-10-CM | POA: Diagnosis not present

## 2017-08-27 DIAGNOSIS — M4856XA Collapsed vertebra, not elsewhere classified, lumbar region, initial encounter for fracture: Secondary | ICD-10-CM | POA: Diagnosis not present

## 2017-08-29 ENCOUNTER — Ambulatory Visit
Admission: RE | Admit: 2017-08-29 | Discharge: 2017-08-29 | Disposition: A | Discharge status: Home / Self Care | Payer: Medicare HMO | Source: Ambulatory Visit | Attending: Rheumatology | Admitting: Rheumatology

## 2017-08-29 ENCOUNTER — Ambulatory Visit
Admission: RE | Admit: 2017-08-29 | Discharge: 2017-08-29 | Disposition: A | Discharge status: Home / Self Care | Payer: Medicare HMO | Source: Ambulatory Visit | Attending: Family Medicine | Admitting: Family Medicine

## 2017-08-29 DIAGNOSIS — Z1231 Encounter for screening mammogram for malignant neoplasm of breast: Secondary | ICD-10-CM | POA: Insufficient documentation

## 2017-08-29 DIAGNOSIS — M4856XA Collapsed vertebra, not elsewhere classified, lumbar region, initial encounter for fracture: Secondary | ICD-10-CM | POA: Insufficient documentation

## 2017-08-29 DIAGNOSIS — M5126 Other intervertebral disc displacement, lumbar region: Secondary | ICD-10-CM | POA: Insufficient documentation

## 2017-08-29 DIAGNOSIS — M0579 Rheumatoid arthritis with rheumatoid factor of multiple sites without organ or systems involvement: Secondary | ICD-10-CM | POA: Diagnosis present

## 2017-08-29 DIAGNOSIS — M48061 Spinal stenosis, lumbar region without neurogenic claudication: Secondary | ICD-10-CM | POA: Insufficient documentation

## 2017-08-29 DIAGNOSIS — S32010A Wedge compression fracture of first lumbar vertebra, initial encounter for closed fracture: Secondary | ICD-10-CM | POA: Diagnosis not present

## 2017-09-07 DIAGNOSIS — S32010A Wedge compression fracture of first lumbar vertebra, initial encounter for closed fracture: Secondary | ICD-10-CM | POA: Diagnosis not present

## 2017-09-11 ENCOUNTER — Ambulatory Visit: Payer: Medicare HMO | Admitting: Anesthesiology

## 2017-09-11 ENCOUNTER — Encounter
Admission: RE | Disposition: A | Discharge status: Home / Self Care | Payer: Self-pay | Source: Ambulatory Visit | Attending: Orthopedic Surgery

## 2017-09-11 ENCOUNTER — Ambulatory Visit: Payer: Medicare HMO

## 2017-09-11 ENCOUNTER — Ambulatory Visit
Admission: RE | Admit: 2017-09-11 | Discharge: 2017-09-11 | Disposition: A | Discharge status: Home / Self Care | Payer: Medicare HMO | Source: Ambulatory Visit | Attending: Orthopedic Surgery | Admitting: Orthopedic Surgery

## 2017-09-11 DIAGNOSIS — M1990 Unspecified osteoarthritis, unspecified site: Secondary | ICD-10-CM | POA: Diagnosis not present

## 2017-09-11 DIAGNOSIS — D649 Anemia, unspecified: Secondary | ICD-10-CM | POA: Diagnosis not present

## 2017-09-11 DIAGNOSIS — G20 Parkinson's disease: Secondary | ICD-10-CM | POA: Insufficient documentation

## 2017-09-11 DIAGNOSIS — R69 Illness, unspecified: Secondary | ICD-10-CM | POA: Diagnosis not present

## 2017-09-11 DIAGNOSIS — Z8719 Personal history of other diseases of the digestive system: Secondary | ICD-10-CM | POA: Insufficient documentation

## 2017-09-11 DIAGNOSIS — M069 Rheumatoid arthritis, unspecified: Secondary | ICD-10-CM | POA: Insufficient documentation

## 2017-09-11 DIAGNOSIS — M8008XA Age-related osteoporosis with current pathological fracture, vertebra(e), initial encounter for fracture: Secondary | ICD-10-CM | POA: Diagnosis not present

## 2017-09-11 DIAGNOSIS — S32010A Wedge compression fracture of first lumbar vertebra, initial encounter for closed fracture: Secondary | ICD-10-CM | POA: Diagnosis not present

## 2017-09-11 DIAGNOSIS — Z880 Allergy status to penicillin: Secondary | ICD-10-CM | POA: Diagnosis not present

## 2017-09-11 DIAGNOSIS — Z79899 Other long term (current) drug therapy: Secondary | ICD-10-CM | POA: Diagnosis not present

## 2017-09-11 DIAGNOSIS — K219 Gastro-esophageal reflux disease without esophagitis: Secondary | ICD-10-CM | POA: Insufficient documentation

## 2017-09-11 DIAGNOSIS — M8580 Other specified disorders of bone density and structure, unspecified site: Secondary | ICD-10-CM | POA: Diagnosis not present

## 2017-09-11 DIAGNOSIS — Z8249 Family history of ischemic heart disease and other diseases of the circulatory system: Secondary | ICD-10-CM | POA: Insufficient documentation

## 2017-09-11 DIAGNOSIS — Z01810 Encounter for preprocedural cardiovascular examination: Secondary | ICD-10-CM | POA: Diagnosis not present

## 2017-09-11 DIAGNOSIS — Z888 Allergy status to other drugs, medicaments and biological substances status: Secondary | ICD-10-CM | POA: Insufficient documentation

## 2017-09-11 DIAGNOSIS — E785 Hyperlipidemia, unspecified: Secondary | ICD-10-CM | POA: Diagnosis not present

## 2017-09-11 DIAGNOSIS — M4856XA Collapsed vertebra, not elsewhere classified, lumbar region, initial encounter for fracture: Secondary | ICD-10-CM | POA: Diagnosis not present

## 2017-09-11 DIAGNOSIS — F329 Major depressive disorder, single episode, unspecified: Secondary | ICD-10-CM | POA: Insufficient documentation

## 2017-09-11 DIAGNOSIS — Z419 Encounter for procedure for purposes other than remedying health state, unspecified: Secondary | ICD-10-CM

## 2017-09-11 HISTORY — PX: KYPHOPLASTY: SHX5884

## 2017-09-11 SURGERY — KYPHOPLASTY
Anesthesia: Monitor Anesthesia Care | Wound class: Clean

## 2017-09-11 MED ORDER — SODIUM CHLORIDE 0.9 % IV SOLN: INTRAVENOUS | Status: DC

## 2017-09-11 MED ORDER — BUPIVACAINE-EPINEPHRINE (PF) 0.5% -1:200000 IJ SOLN
INTRAMUSCULAR | Status: DC | PRN
  Administered 2017-09-11: 20 mL

## 2017-09-11 MED ORDER — PROPOFOL 10 MG/ML IV BOLUS
INTRAVENOUS | Status: AC
  Filled 2017-09-11: qty 20

## 2017-09-11 MED ORDER — LACTATED RINGERS IV SOLN
INTRAVENOUS | Status: DC
  Administered 2017-09-11 (×2): via INTRAVENOUS

## 2017-09-11 MED ORDER — IOPAMIDOL (ISOVUE-M 200) INJECTION 41%
INTRAMUSCULAR | Status: AC
  Filled 2017-09-11: qty 20

## 2017-09-11 MED ORDER — BUPIVACAINE-EPINEPHRINE (PF) 0.5% -1:200000 IJ SOLN
INTRAMUSCULAR | Status: AC
  Filled 2017-09-11: qty 30

## 2017-09-11 MED ORDER — HYDROCODONE-ACETAMINOPHEN 5-325 MG PO TABS: 1.0000 | ORAL_TABLET | ORAL | Status: DC | PRN

## 2017-09-11 MED ORDER — FENTANYL CITRATE (PF) 100 MCG/2ML IJ SOLN: 25.0000 ug | INTRAMUSCULAR | Status: DC | PRN

## 2017-09-11 MED ORDER — MIDAZOLAM HCL 2 MG/2ML IJ SOLN
INTRAMUSCULAR | Status: DC | PRN
  Administered 2017-09-11 (×2): .5 mg via INTRAVENOUS
  Administered 2017-09-11: 1 mg via INTRAVENOUS

## 2017-09-11 MED ORDER — METOCLOPRAMIDE HCL 10 MG PO TABS: 5.0000 mg | ORAL_TABLET | Freq: Three times a day (TID) | ORAL | Status: DC | PRN

## 2017-09-11 MED ORDER — ONDANSETRON HCL 4 MG/2ML IJ SOLN: 4.0000 mg | Freq: Once | INTRAMUSCULAR | Status: DC | PRN

## 2017-09-11 MED ORDER — MIDAZOLAM HCL 2 MG/2ML IJ SOLN
INTRAMUSCULAR | Status: AC
  Filled 2017-09-11: qty 2

## 2017-09-11 MED ORDER — HYDROCODONE-ACETAMINOPHEN 5-325 MG PO TABS: 1.0000 | ORAL_TABLET | Freq: Four times a day (QID) | ORAL | 0 refills | Status: DC | PRN

## 2017-09-11 MED ORDER — PROPOFOL 500 MG/50ML IV EMUL
INTRAVENOUS | Status: DC | PRN
  Administered 2017-09-11: 35 ug/kg/min via INTRAVENOUS

## 2017-09-11 MED ORDER — LIDOCAINE HCL 1 % IJ SOLN
INTRAMUSCULAR | Status: DC | PRN
  Administered 2017-09-11: 30 mL

## 2017-09-11 MED ORDER — ONDANSETRON HCL 4 MG/2ML IJ SOLN: 4.0000 mg | Freq: Four times a day (QID) | INTRAMUSCULAR | Status: DC | PRN

## 2017-09-11 MED ORDER — LIDOCAINE HCL (PF) 1 % IJ SOLN
INTRAMUSCULAR | Status: AC
  Filled 2017-09-11: qty 60

## 2017-09-11 MED ORDER — CLINDAMYCIN PHOSPHATE 900 MG/50ML IV SOLN
INTRAVENOUS | Status: AC
  Filled 2017-09-11: qty 50

## 2017-09-11 MED ORDER — KETAMINE HCL 50 MG/ML IJ SOLN
INTRAMUSCULAR | Status: DC | PRN
  Administered 2017-09-11: 25 mg via INTRAMUSCULAR

## 2017-09-11 MED ORDER — METOCLOPRAMIDE HCL 5 MG/ML IJ SOLN: 5.0000 mg | Freq: Three times a day (TID) | INTRAMUSCULAR | Status: DC | PRN

## 2017-09-11 MED ORDER — ONDANSETRON HCL 4 MG PO TABS: 4.0000 mg | ORAL_TABLET | Freq: Four times a day (QID) | ORAL | Status: DC | PRN

## 2017-09-11 MED ORDER — KETAMINE HCL 50 MG/ML IJ SOLN
INTRAMUSCULAR | Status: AC
  Filled 2017-09-11: qty 10

## 2017-09-11 MED ORDER — CLINDAMYCIN PHOSPHATE 900 MG/50ML IV SOLN
900.0000 mg | Freq: Once | INTRAVENOUS | Status: AC
  Administered 2017-09-11: 900 mg via INTRAVENOUS

## 2017-09-11 SURGICAL SUPPLY — 21 items
ADH SKN CLS APL DERMABOND .7 (GAUZE/BANDAGES/DRESSINGS) ×1
CEMENT BONE KYPHON CDS (Cement) ×2 IMPLANT
CEMENT KYPHON CX01A KIT/MIXER (Cement) ×2 IMPLANT
DERMABOND ADVANCED (GAUZE/BANDAGES/DRESSINGS) ×1
DERMABOND ADVANCED .7 DNX12 (GAUZE/BANDAGES/DRESSINGS) ×1 IMPLANT
DEVICE BIOPSY BONE KYPHX (INSTRUMENTS) ×2 IMPLANT
DRAPE C-ARM XRAY 36X54 (DRAPES) ×2 IMPLANT
DURAPREP 26ML APPLICATOR (WOUND CARE) ×2 IMPLANT
GLOVE BIO SURGEON STRL SZ 6.5 (GLOVE) ×4 IMPLANT
GLOVE BIOGEL PI IND STRL 6.5 (GLOVE) IMPLANT
GLOVE BIOGEL PI INDICATOR 6.5 (GLOVE) ×2
GLOVE SURG SYN 9.0  PF PI (GLOVE) ×1
GLOVE SURG SYN 9.0 PF PI (GLOVE) ×1 IMPLANT
GOWN SRG 2XL LVL 4 RGLN SLV (GOWNS) ×1 IMPLANT
GOWN STRL NON-REIN 2XL LVL4 (GOWNS) ×2
GOWN STRL REUS W/ TWL LRG LVL3 (GOWN DISPOSABLE) ×1 IMPLANT
GOWN STRL REUS W/TWL LRG LVL3 (GOWN DISPOSABLE) ×4
PACK KYPHOPLASTY (MISCELLANEOUS) ×2 IMPLANT
STRAP SAFETY BODY (MISCELLANEOUS) ×2 IMPLANT
TRAY KYPHOPAK 15/3 EXPRESS 1ST (MISCELLANEOUS) ×1 IMPLANT
TRAY KYPHOPAK 20/3 EXPRESS 1ST (MISCELLANEOUS) ×2 IMPLANT

## 2017-09-11 NOTE — Anesthesia Procedure Notes (Addendum)
Date/Time: 09/11/2017 1:57 PM Performed by: Nelda Marseille Pre-anesthesia Checklist: Patient identified, Emergency Drugs available, Suction available, Patient being monitored and Timeout performed Oxygen Delivery Method: Nasal cannula

## 2017-09-11 NOTE — H&P (Signed)
Reviewed paper H+P, will be scanned into chart. No changes noted.  

## 2017-09-11 NOTE — Anesthesia Post-op Follow-up Note (Signed)
Anesthesia QCDR form completed.        

## 2017-09-11 NOTE — Transfer of Care (Signed)
Immediate Anesthesia Transfer of Care Note  Patient: Morgan Dennis  Procedure(s) Performed: Procedure(s) with comments: KYPHOPLASTY-L1 (N/A) - L1  Patient Location: PACU  Anesthesia Type:General  Level of Consciousness: awake, alert  and oriented  Airway & Oxygen Therapy: Patient Spontanous Breathing and Patient connected to nasal cannula oxygen  Post-op Assessment: Report given to RN and Post -op Vital signs reviewed and stable  Post vital signs: Reviewed and stable  Last Vitals:  Vitals:   09/11/17 1139 09/11/17 1422  BP: (!) 153/63 (!) 171/84  Pulse: 79 92  Resp: 20 18  Temp: 36.6 C 37.5 C  SpO2: 94% 100%    Last Pain:  Vitals:   09/11/17 1422  TempSrc: Temporal  PainSc:          Complications: No apparent anesthesia complications

## 2017-09-11 NOTE — Anesthesia Postprocedure Evaluation (Signed)
Anesthesia Post Note  Patient: Morgan Dennis  Procedure(s) Performed: Procedure(s) (LRB): KYPHOPLASTY-L1 (N/A)  Patient location during evaluation: PACU Anesthesia Type: MAC Level of consciousness: awake and alert Pain management: pain level controlled Vital Signs Assessment: post-procedure vital signs reviewed and stable Respiratory status: spontaneous breathing, nonlabored ventilation, respiratory function stable and patient connected to nasal cannula oxygen Cardiovascular status: blood pressure returned to baseline and stable Postop Assessment: no apparent nausea or vomiting Anesthetic complications: no     Last Vitals:  Vitals:   09/11/17 1450 09/11/17 1509  BP:  (!) 179/81  Pulse:  87  Resp:  16  Temp: 37.1 C 36.5 C  SpO2:  96%    Last Pain:  Vitals:   09/11/17 1509  TempSrc: Temporal  PainSc:                  Jina Olenick S

## 2017-09-11 NOTE — Op Note (Addendum)
09/11/2017  2:15 PM  PATIENT:  Morgan Dennis  83 y.o. female  PRE-OPERATIVE DIAGNOSIS:  COMPRESSION FRACTURE L1 VERTEBRAE  POST-OPERATIVE DIAGNOSIS:  COMPRESSION FRACTURE L1 VERTEBRAE  PROCEDURE:  Procedure(s) with comments: KYPHOPLASTY-L1 (N/A) - L1  SURGEON: Laurene Footman, MD  ASSISTANTS:  none  ANESTHESIA:   local and MAC  EBL:  No intake/output data recorded.  BLOOD ADMINISTERED:none  DRAINS: none   LOCAL MEDICATIONS USED:  MARCAINE    and XYLOCAINE   SPECIMEN:  Source of Specimen:  L1 verteral body  DISPOSITION OF SPECIMEN:  PATHOLOGY  COUNTS:  YES  TOURNIQUET:  * No tourniquets in log *  IMPLANTS:   Bone cement  DICTATION: .Dragon Dictation patient brought the operating room and after adequate sedation was given the patient was placed prone on the operative table with C-arm brought in and good visualization of the was obtained in both AP and lateral projections. Local anesthetic was then infiltrated in subcutaneous tissues tissue on the right and left side of L1. The back was then prepped and draped in sterile fashion and repeat timeout procedure with patient identification carried out and local anesthetic infiltrated down to the pedicle on the right side with a 50-50 mix of 1% Xylocaine have percent Sensorcaine. After allowing this to set trochars advanced Pedicular fashion into the vertebral body and biopsy obtained. Drilling was carried out and then balloon inserted and 4 cc of inflation with partial correction of the kyphotic deformity obtained. Cement was mixed and was a appropriate consistency. Approximately 40 half cc of cement and inserted without extravasation. There is good fill superior to inferior and to both lateral sides. When the cement was a appropriate hardness the trochars removed and permanent C-arm views obtained. The wound was closed with Dermabond followed by a Band-Aid  PLAN OF CARE: Discharge to home after PACU  PATIENT DISPOSITION:  PACU -  hemodynamically stable.

## 2017-09-11 NOTE — Discharge Instructions (Addendum)
Remove band aid Thursday, ok to shower Pain med as directed    [place]   1) The drugs that you were given will stay in your system until tomorrow so for the next 24 hours you should not:  A) Drive an automobile B) Make any legal decisions C) Drink any alcoholic beverage   2) You may resume regular meals tomorrow.  Today it is better to start with liquids and gradually work up to solid foods.  You may eat anything you prefer, but it is better to start with liquids, then soup and crackers, and gradually work up to solid foods.   3) Please notify your doctor immediately if you have any unusual bleeding, trouble breathing, redness and pain at the surgery site, drainage, fever, or pain not relieved by medication.    4) Additional Instructions:        Please contact your physician with any problems or Same Day Surgery at [phone], Monday through Friday 6 am to 4 pm, or [place] at [facility] number at [phone].
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
## 2017-09-12 ENCOUNTER — Encounter: Payer: Self-pay | Admitting: Orthopedic Surgery

## 2017-09-13 ENCOUNTER — Encounter: Payer: Self-pay | Admitting: Orthopedic Surgery

## 2017-09-13 LAB — SURGICAL PATHOLOGY

## 2017-09-26 DIAGNOSIS — S32010A Wedge compression fracture of first lumbar vertebra, initial encounter for closed fracture: Secondary | ICD-10-CM | POA: Diagnosis not present

## 2017-10-01 DIAGNOSIS — M0579 Rheumatoid arthritis with rheumatoid factor of multiple sites without organ or systems involvement: Secondary | ICD-10-CM | POA: Diagnosis not present

## 2017-10-01 DIAGNOSIS — M0589 Other rheumatoid arthritis with rheumatoid factor of multiple sites: Secondary | ICD-10-CM | POA: Diagnosis not present

## 2017-10-05 DIAGNOSIS — R69 Illness, unspecified: Secondary | ICD-10-CM | POA: Diagnosis not present

## 2017-10-27 ENCOUNTER — Other Ambulatory Visit: Payer: Self-pay | Admitting: Family Medicine

## 2017-10-27 DIAGNOSIS — K219 Gastro-esophageal reflux disease without esophagitis: Secondary | ICD-10-CM

## 2017-10-29 ENCOUNTER — Other Ambulatory Visit: Payer: Self-pay

## 2017-10-29 ENCOUNTER — Other Ambulatory Visit: Payer: Self-pay | Admitting: Family Medicine

## 2017-10-29 DIAGNOSIS — M0579 Rheumatoid arthritis with rheumatoid factor of multiple sites without organ or systems involvement: Secondary | ICD-10-CM | POA: Diagnosis not present

## 2017-10-29 DIAGNOSIS — K219 Gastro-esophageal reflux disease without esophagitis: Secondary | ICD-10-CM

## 2017-10-29 MED ORDER — OMEPRAZOLE 20 MG PO CPDR
20.0000 mg | DELAYED_RELEASE_CAPSULE | Freq: Every day | ORAL | 5 refills | Status: DC
Start: 2017-10-29 — End: 2017-11-19

## 2017-10-29 NOTE — Telephone Encounter (Signed)
[facility] faxed a request for the following medication. Thanks CC  omeprazole (PRILOSEC) 20 MG capsule

## 2017-10-30 ENCOUNTER — Other Ambulatory Visit: Payer: Self-pay

## 2017-10-30 DIAGNOSIS — K219 Gastro-esophageal reflux disease without esophagitis: Secondary | ICD-10-CM

## 2017-10-30 MED ORDER — OMEPRAZOLE 20 MG PO CPDR: 20.0000 mg | DELAYED_RELEASE_CAPSULE | Freq: Every day | ORAL | 5 refills | Status: DC

## 2017-11-01 ENCOUNTER — Ambulatory Visit: Payer: Medicare HMO | Admitting: Family Medicine

## 2017-11-19 ENCOUNTER — Ambulatory Visit: Payer: Medicare HMO | Admitting: Family Medicine

## 2017-11-19 ENCOUNTER — Other Ambulatory Visit: Payer: Self-pay

## 2017-11-19 VITALS — BP 138/82 | HR 96 | Resp 16 | Wt 153.0 lb

## 2017-11-19 DIAGNOSIS — M069 Rheumatoid arthritis, unspecified: Secondary | ICD-10-CM | POA: Diagnosis not present

## 2017-11-19 DIAGNOSIS — E7849 Other hyperlipidemia: Secondary | ICD-10-CM

## 2017-11-19 DIAGNOSIS — F09 Unspecified mental disorder due to known physiological condition: Secondary | ICD-10-CM

## 2017-11-19 DIAGNOSIS — S32010S Wedge compression fracture of first lumbar vertebra, sequela: Secondary | ICD-10-CM

## 2017-11-19 DIAGNOSIS — R5381 Other malaise: Secondary | ICD-10-CM

## 2017-11-19 DIAGNOSIS — R5383 Other fatigue: Secondary | ICD-10-CM

## 2017-11-19 DIAGNOSIS — M8000XD Age-related osteoporosis with current pathological fracture, unspecified site, subsequent encounter for fracture with routine healing: Secondary | ICD-10-CM

## 2017-11-19 DIAGNOSIS — R69 Illness, unspecified: Secondary | ICD-10-CM | POA: Diagnosis not present

## 2017-11-19 NOTE — Progress Notes (Signed)
Morgan Dennis  MRN: [id number] DOB: 06-29-1934  Subjective:  HPI   The patient is an 83 year old female who presents today for follow up of chronic illnesses.  She was last seen on 05/03/17 for MCI/early dementia, Parkinson disease, RA, GERD, hypercholesterolemia, depression and neuropathy.   MCI/dementia-Patient had MMSE that she scored 22/30 which was slightly worse and she was instructed not to be driving any more.    Hyperlipidemia-The patient experienced myalgias on statins and is not on them currently.  She is due to have her cholesterol checked today while off of the statin.    Since the last visit the patient had an ER visit on 08/20/17 for right sided low back pain which after testing revealed a mild L1 compression fracture.  She has had kyphoplasty by Dr Rudene Christians on 09/11/17.  When asked if the patient had fallen she states she does not remember.    Patient is accompanied by her husband today and he has been asked to participate in the visit with the patient in agreement.   Patient Active Problem List   Diagnosis Date Noted  . Parkinson's disease ([place]) 04/23/2017  . Skin cancer 02/12/2017  . Chronic bronchitis ([place]) 01/02/2017  . Frequency of urination and polyuria 10/12/2015  . Upper GI bleed 08/18/2015  . Adaptation reaction 04/30/2015  . Absolute anemia 04/30/2015  . Acid reflux 04/30/2015  . HLD (hyperlipidemia) 04/30/2015  . Malaise and fatigue 04/30/2015  . Affective disorder, major 04/30/2015  . Bad memory 04/30/2015  . Mild cognitive disorder 04/30/2015  . Fungal infection of toenail 04/30/2015  . Arthritis, degenerative 04/30/2015  . OP (osteoporosis) 04/30/2015  . Arthritis or polyarthritis, rheumatoid ([place]) 04/30/2015  . Avitaminosis D 05/12/2014  . Trigger finger 05/12/2014  . Rheumatoid arthritis ([place]) 03/17/2014    Past Medical History:  Diagnosis Date  . Arthritis   . GERD (gastroesophageal reflux disease)     Social History   Socioeconomic History    . Marital status: Married    Spouse name: Percell Miller  . Number of children: 1  . Years of education: 70  . Highest education level: Not on file  Social Needs  . Financial resource strain: Not on file  . Food insecurity - worry: Not on file  . Food insecurity - inability: Not on file  . Transportation needs - medical: Not on file  . Transportation needs - non-medical: Not on file  Occupational History  . Occupation: retired  Tobacco Use  . Smoking status: Never Smoker  . Smokeless tobacco: Never Used  Substance and Sexual Activity  . Alcohol use: No  . Drug use: No  . Sexual activity: No  Other Topics Concern  . Not on file  Social History Narrative  . Not on file   Patient did not bring in her medications and said that nothing had changed.  She has some cognitive issues so this was difficult to accurately reconcile.  She has been asked to bring in her medication in the future.   Outpatient Encounter Medications as of 11/19/2017  Medication Sig Note  . abatacept (ORENCIA) 250 MG injection Inject 125 mg into the vein every 30 (thirty) days.    Marland Kitchen acetaminophen (TYLENOL) 325 MG tablet Take 2 tablets (650 mg total) by mouth every 6 (six) hours as needed for mild pain (or Fever >/= 101).   Marland Kitchen ascorbic acid (VITAMIN C) 1000 MG tablet Take 1,000 mg by mouth daily.   Marland Kitchen CALCIUM PO Take 2,000  mg by mouth daily.   . carbidopa-levodopa (SINEMET IR) 25-100 MG tablet Take 1 tablet by mouth 3 (three) times daily with meals.    . Cholecalciferol (VITAMIN D3) 2000 UNITS TABS Take 2,000 Units by mouth daily.    Mariane Baumgarten Sodium (DSS) 100 MG CAPS TAKE ONE (1) CAPSULE BY MOUTH 2 TIMES DAILY AS NEEDED FOR CONSTIPATION. 09/10/2017: CURRENTLY TAKING DAILY   . donepezil (ARICEPT) 10 MG tablet Take 10 mg by mouth daily with supper.    . DULoxetine (CYMBALTA) 60 MG capsule Take 60 mg by mouth daily.   . folic acid (FOLVITE) 1 MG tablet Take 1 mg by mouth daily.   Marland Kitchen gabapentin (NEURONTIN) 300 MG capsule Take  600-900 capsules by mouth 3 (three) times daily. 2 capsules (600 mg) in the morning, 2 capsules (600 mg) at lunch and 3 capsule (900 mg) at bedtime.   Marland Kitchen HYDROcodone-acetaminophen (NORCO) 5-325 MG tablet Take 1 tablet by mouth every 6 (six) hours as needed for moderate pain.   . Melatonin 5 MG TABS Take 5 mg by mouth at bedtime.    . memantine (NAMENDA) 5 MG tablet Take 1 tablet (5 mg total) by mouth 2 (two) times daily. (Patient taking differently: Take 5 mg by mouth 2 (two) times daily. In the morning and with supper)   . methotrexate 2.5 MG tablet Take 15 mg by mouth every Wednesday.    . Multiple Vitamin (MULTIVITAMIN WITH MINERALS) TABS tablet Take 1 tablet by mouth daily.   Marland Kitchen neomycin-polymyxin-dexamethasone (MAXITROL) 0.1 % ophthalmic suspension Place 1 drop into both eyes daily.   . Omega-3 Fatty Acids (FISH OIL) 1000 MG CAPS Take 1 capsule (1,000 mg total) by mouth daily. (Patient taking differently: Take 1,000 mg by mouth daily. )   . omeprazole (PRILOSEC) 20 MG capsule Take 1 capsule (20 mg total) daily by mouth.   Marland Kitchen omeprazole (PRILOSEC) 20 MG capsule Take 1 capsule (20 mg total) daily by mouth.   . polyethylene glycol powder (GLYCOLAX/MIRALAX) powder Take 17 g by mouth daily as needed (FOR CONSTIPATION). 09/10/2017: CURRENTLY TAKING DAILY  . predniSONE (DELTASONE) 20 MG tablet Take one pill twice daily with food   . solifenacin (VESICARE) 5 MG tablet Take 1 tablet (5 mg total) by mouth daily.   . traMADol (ULTRAM) 50 MG tablet Take 1 tablet (50 mg total) by mouth 2 (two) times daily. (Patient taking differently: Take 50 mg by mouth every 6 (six) hours as needed (for pain.). )   . vitamin E 400 UNIT capsule Take 400 Units by mouth daily.    No facility-administered encounter medications on file as of 11/19/2017.     Allergies  Allergen Reactions  . Penicillins Swelling and Rash    Other reaction(s): Rash Has patient had a PCN reaction causing immediate rash, facial/tongue/throat  swelling, SOB or lightheadedness with hypotension: Yes Has patient had a PCN reaction causing severe rash involving mucus membranes or skin necrosis: Unknown Has patient had a PCN reaction that required hospitalization:Yes Has patient had a PCN reaction occurring within the last 10 years: No If all of the above answers are "NO", then may proceed with Cephalosporin use.   . Evista  [Raloxifene]     Other reaction(s): Joint Pains  . Ibandronic Acid     Other reaction(s): Muscle Pain  . Remicade [Infliximab] Other (See Comments)    Due to bleeding ulcer issue--MD advised her not to take  . Risedronate Sodium     Other reaction(s): Joint  Pains    Review of Systems  Constitutional: Negative for fever and malaise/fatigue.  Eyes: Negative.   Respiratory: Negative for cough, shortness of breath and wheezing.   Cardiovascular: Negative for chest pain, palpitations, orthopnea, claudication and leg swelling.  Gastrointestinal: Negative.   Musculoskeletal: Positive for joint pain.  Skin: Negative.   Neurological: Negative for weakness.  Endo/Heme/Allergies: Negative.   Psychiatric/Behavioral: Positive for memory loss.    Objective:  BP 138/82 (BP Location: Right Arm, Patient Position: Sitting, Cuff Size: Normal)   Pulse 96   Resp 16   Wt 153 lb (69.4 kg)   BMI 26.26 kg/m   Physical Exam  Constitutional: She is well-developed, well-nourished, and in no distress.  HENT:  Head: Normocephalic and atraumatic.  Eyes: Conjunctivae are normal. Pupils are equal, round, and reactive to light.  Neck: Normal range of motion.  Cardiovascular: Normal rate and regular rhythm.  Murmur (II/VI systolic murmur) heard. Pulmonary/Chest: Effort normal and breath sounds normal.  Neurological: She is alert. No cranial nerve deficit. She exhibits normal muscle tone. Gait normal. Coordination normal.  Skin: Skin is warm and dry.  Psychiatric: Mood, memory, affect and judgment normal.    Assessment and  Plan :  MCI Progressive Rheumatoid Arthritis HLD GERD Osteoporosis F/u BMD with recent kyphplasty fro vertebral fracture.

## 2017-11-19 NOTE — Patient Instructions (Signed)
Stop taking Omeprazole (Prilosec)  Someone will call about the bone density study.   Get labs drawn and await results of cholesterol.

## 2017-11-22 DIAGNOSIS — R5383 Other fatigue: Secondary | ICD-10-CM | POA: Diagnosis not present

## 2017-11-22 DIAGNOSIS — R5381 Other malaise: Secondary | ICD-10-CM | POA: Diagnosis not present

## 2017-11-22 DIAGNOSIS — E7849 Other hyperlipidemia: Secondary | ICD-10-CM | POA: Diagnosis not present

## 2017-11-22 LAB — CBC WITH DIFFERENTIAL/PLATELET
BASOS ABS: 42 {cells}/uL (ref 0–200)
Basophils Relative: 0.8 %
EOS PCT: 4.5 %
Eosinophils Absolute: 234 cells/uL (ref 15–500)
HEMATOCRIT: 39.4 % (ref 35.0–45.0)
Hemoglobin: 13.2 g/dL (ref 11.7–15.5)
LYMPHS ABS: 1721 {cells}/uL (ref 850–3900)
MCH: 32 pg (ref 27.0–33.0)
MCHC: 33.5 g/dL (ref 32.0–36.0)
MCV: 95.4 fL (ref 80.0–100.0)
MONOS PCT: 11.8 %
MPV: 11.1 fL (ref 7.5–12.5)
NEUTROS PCT: 49.8 %
Neutro Abs: 2590 cells/uL (ref 1500–7800)
Platelets: 169 10*3/uL (ref 140–400)
RBC: 4.13 10*6/uL (ref 3.80–5.10)
RDW: 12.6 % (ref 11.0–15.0)
TOTAL LYMPHOCYTE: 33.1 %
WBC mixed population: 614 cells/uL (ref 200–950)
WBC: 5.2 10*3/uL (ref 3.8–10.8)

## 2017-11-22 LAB — COMPLETE METABOLIC PANEL WITH GFR
AG Ratio: 1.5 (calc) (ref 1.0–2.5)
ALT: 16 U/L (ref 6–29)
AST: 23 U/L (ref 10–35)
Albumin: 3.8 g/dL (ref 3.6–5.1)
Alkaline phosphatase (APISO): 67 U/L (ref 33–130)
BUN: 16 mg/dL (ref 7–25)
CALCIUM: 9 mg/dL (ref 8.6–10.4)
CO2: 32 mmol/L (ref 20–32)
CREATININE: 0.66 mg/dL (ref 0.60–0.88)
Chloride: 104 mmol/L (ref 98–110)
GFR, EST NON AFRICAN AMERICAN: 83 mL/min/{1.73_m2} (ref >=60)
GFR, Est African American: 96 mL/min/{1.73_m2} (ref >=60)
GLOBULIN: 2.6 g/dL (ref 1.9–3.7)
Glucose, Bld: 98 mg/dL (ref 65–99)
Potassium: 4.2 mmol/L (ref 3.5–5.3)
Sodium: 142 mmol/L (ref 135–146)
Total Bilirubin: 0.7 mg/dL (ref 0.2–1.2)
Total Protein: 6.4 g/dL (ref 6.1–8.1)

## 2017-11-22 LAB — TSH: TSH: 3.55 mIU/L (ref 0.40–4.50)

## 2017-11-22 LAB — LIPID PANEL
CHOL/HDL RATIO: 5.7 (calc) — AB (ref <5.0)
CHOLESTEROL: 240 mg/dL — AB (ref <200)
HDL: 42 mg/dL — ABNORMAL LOW (ref >50)
LDL Cholesterol (Calc): 167 mg/dL (calc) — ABNORMAL HIGH
NON-HDL CHOLESTEROL (CALC): 198 mg/dL — AB (ref <130)
Triglycerides: 165 mg/dL — ABNORMAL HIGH (ref <150)

## 2017-11-27 DIAGNOSIS — M0579 Rheumatoid arthritis with rheumatoid factor of multiple sites without organ or systems involvement: Secondary | ICD-10-CM | POA: Diagnosis not present

## 2017-12-26 ENCOUNTER — Telehealth: Payer: Self-pay | Admitting: Family Medicine

## 2017-12-26 NOTE — Telephone Encounter (Signed)
Please advise? Or do you want her to have an appt?

## 2017-12-26 NOTE — Telephone Encounter (Signed)
Appt with someone.

## 2017-12-26 NOTE — Telephone Encounter (Signed)
Patient and her husband advised and appointment-Anastasiya V Hopkins, RMA

## 2017-12-26 NOTE — Telephone Encounter (Signed)
Pt cell number [phone]

## 2017-12-26 NOTE — Telephone Encounter (Signed)
Patient thinks she has a UTI wanted to know if she can come by and give a specimen  CB# [phone]

## 2017-12-27 ENCOUNTER — Ambulatory Visit: Payer: Medicare HMO | Admitting: Physician Assistant

## 2017-12-27 ENCOUNTER — Encounter: Payer: Self-pay | Admitting: Physician Assistant

## 2017-12-27 VITALS — BP 130/80 | HR 88 | Temp 98.7°F | Resp 16 | Wt 153.2 lb

## 2017-12-27 DIAGNOSIS — N390 Urinary tract infection, site not specified: Secondary | ICD-10-CM

## 2017-12-27 DIAGNOSIS — R350 Frequency of micturition: Secondary | ICD-10-CM | POA: Diagnosis not present

## 2017-12-27 LAB — POCT URINALYSIS DIPSTICK
Bilirubin, UA: NEGATIVE
Glucose, UA: NEGATIVE
Ketones, UA: NEGATIVE
Leukocytes, UA: NEGATIVE
Nitrite, UA: NEGATIVE
Protein, UA: NEGATIVE
Spec Grav, UA: 1.01 (ref 1.010–1.025)
Urobilinogen, UA: 0.2 E.U./dL
pH, UA: 6.5 (ref 5.0–8.0)

## 2017-12-27 MED ORDER — DOXYCYCLINE HYCLATE 100 MG PO TABS: 100.0000 mg | ORAL_TABLET | Freq: Two times a day (BID) | ORAL | 0 refills | Status: AC

## 2017-12-27 NOTE — Progress Notes (Signed)
Patient: Morgan Dennis Female    DOB: 09/05/1934   84 y.o.   MRN: [id number] Visit Date: 12/27/2017  Today's Provider: Trinna Post, PA-C   Chief Complaint  Patient presents with  . Urinary Tract Infection   Subjective:    Morgan Dennis is an 84 y/o woman with history of Parkinson's disease, mild cognitive disorder, chronic bronchitis, and overactive bladder is presenting today for concerns of UTI. She reports she has had several days of urinary frequency. She denies fevers, chills, back pain, N/V.   Urinary Tract Infection   This is a new problem. The current episode started in the past 7 days. The problem occurs every urination. The problem has been unchanged. The quality of the pain is described as burning and aching ("it hurts sometimes" mostly burning). The pain is at a severity of 2/10. The pain is mild. There has been no fever. Pertinent negatives include no chills, discharge, frequency, hematuria, nausea, urgency or vomiting. She has tried nothing for the symptoms.       Allergies  Allergen Reactions  . Penicillins Swelling and Rash    Other reaction(s): Rash Has patient had a PCN reaction causing immediate rash, facial/tongue/throat swelling, SOB or lightheadedness with hypotension: Yes Has patient had a PCN reaction causing severe rash involving mucus membranes or skin necrosis: Unknown Has patient had a PCN reaction that required hospitalization:Yes Has patient had a PCN reaction occurring within the last 10 years: No If all of the above answers are "NO", then may proceed with Cephalosporin use.   . Evista  [Raloxifene]     Other reaction(s): Joint Pains  . Ibandronic Acid     Other reaction(s): Muscle Pain  . Remicade [Infliximab] Other (See Comments)    Due to bleeding ulcer issue--MD advised her not to take  . Risedronate Sodium     Other reaction(s): Joint Pains     Current Outpatient Medications:  .  abatacept (ORENCIA) 250 MG injection, Inject 125  mg into the vein every 30 (thirty) days. , Disp: , Rfl:  .  acetaminophen (TYLENOL) 325 MG tablet, Take 2 tablets (650 mg total) by mouth every 6 (six) hours as needed for mild pain (or Fever >/= 101)., Disp: , Rfl:  .  ascorbic acid (VITAMIN C) 1000 MG tablet, Take 1,000 mg by mouth daily., Disp: , Rfl:  .  CALCIUM PO, Take 2,000 mg by mouth daily., Disp: , Rfl:  .  carbidopa-levodopa (SINEMET IR) 25-100 MG tablet, Take 1 tablet by mouth 3 (three) times daily with meals. , Disp: , Rfl:  .  Cholecalciferol (VITAMIN D3) 2000 UNITS TABS, Take 2,000 Units by mouth daily. , Disp: , Rfl:  .  Docusate Sodium (DSS) 100 MG CAPS, TAKE ONE (1) CAPSULE BY MOUTH 2 TIMES DAILY AS NEEDED FOR CONSTIPATION., Disp: , Rfl:  .  donepezil (ARICEPT) 10 MG tablet, Take 10 mg by mouth daily with supper. , Disp: , Rfl:  .  DULoxetine (CYMBALTA) 60 MG capsule, Take 60 mg by mouth daily., Disp: , Rfl:  .  folic acid (FOLVITE) 1 MG tablet, Take 1 mg by mouth daily., Disp: , Rfl:  .  gabapentin (NEURONTIN) 300 MG capsule, Take 600-900 capsules by mouth 3 (three) times daily. 2 capsules (600 mg) in the morning, 2 capsules (600 mg) at lunch and 3 capsule (900 mg) at bedtime., Disp: , Rfl:  .  HYDROcodone-acetaminophen (NORCO) 5-325 MG tablet, Take 1 tablet by mouth  every 6 (six) hours as needed for moderate pain., Disp: 15 tablet, Rfl: 0 .  Melatonin 5 MG TABS, Take 5 mg by mouth at bedtime. , Disp: , Rfl:  .  memantine (NAMENDA) 5 MG tablet, Take 1 tablet (5 mg total) by mouth 2 (two) times daily. (Patient taking differently: Take 5 mg by mouth 2 (two) times daily. In the morning and with supper), Disp: 180 tablet, Rfl: 3 .  methotrexate 2.5 MG tablet, Take 15 mg by mouth every Wednesday. , Disp: , Rfl:  .  Multiple Vitamin (MULTIVITAMIN WITH MINERALS) TABS tablet, Take 1 tablet by mouth daily., Disp: , Rfl:  .  neomycin-polymyxin-dexamethasone (MAXITROL) 0.1 % ophthalmic suspension, Place 1 drop into both eyes daily., Disp: ,  Rfl:  .  Omega-3 Fatty Acids (FISH OIL) 1000 MG CAPS, Take 1 capsule (1,000 mg total) by mouth daily. (Patient taking differently: Take 1,000 mg by mouth daily. ), Disp: , Rfl: 0 .  polyethylene glycol powder (GLYCOLAX/MIRALAX) powder, Take 17 g by mouth daily as needed (FOR CONSTIPATION)., Disp: , Rfl:  .  solifenacin (VESICARE) 5 MG tablet, Take 1 tablet (5 mg total) by mouth daily., Disp: 30 tablet, Rfl: 12 .  traMADol (ULTRAM) 50 MG tablet, Take 1 tablet (50 mg total) by mouth 2 (two) times daily. (Patient taking differently: Take 50 mg by mouth every 6 (six) hours as needed (for pain.). ), Disp: 10 tablet, Rfl: 0 .  vitamin E 400 UNIT capsule, Take 400 Units by mouth daily., Disp: , Rfl:  .  predniSONE (DELTASONE) 20 MG tablet, Take one pill twice daily with food, Disp: 14 tablet, Rfl: 0  Review of Systems  Constitutional: Negative for chills.  Cardiovascular: Negative for chest pain, palpitations and leg swelling.  Gastrointestinal: Negative for abdominal pain, nausea and vomiting.  Genitourinary: Negative for frequency, hematuria and urgency.  Musculoskeletal: Negative for back pain.    Social History   Tobacco Use  . Smoking status: Never Smoker  . Smokeless tobacco: Never Used  Substance Use Topics  . Alcohol use: No   Objective:   BP 130/80 (BP Location: Left Arm, Patient Position: Sitting, Cuff Size: Normal)   Pulse 88   Temp 98.7 F (37.1 C) (Oral)   Resp 16   Wt 153 lb 3.2 oz (69.5 kg)   SpO2 94%   BMI 26.30 kg/m    Physical Exam  Constitutional: She appears well-developed and well-nourished.  Cardiovascular: Normal rate and regular rhythm.  Pulmonary/Chest: Effort normal and breath sounds normal.  Abdominal: Soft. Bowel sounds are normal. There is no tenderness. There is no CVA tenderness.  Neurological: She is alert.  Skin: Skin is warm and dry.  Psychiatric: She has a normal mood and affect. Her behavior is normal.        Assessment & Plan:     1.  Urinary tract infection without hematuria, site unspecified  Her urinalysis is positive for some trace blood. She seems alert today in office, no acute distress. Her Pulse ox is 93% which seems to be her baseline, and she does have chronic bronchitis. I am not sure if this represents acute infection vs. Exacerbation of her OAB, as patient is somewhat of an incomplete historian. Will cover for cystitis with doxycycline, which patient has tolerated before per EMR review.  - POCT urinalysis dipstick - Urine Culture  2. Urinary frequency  - doxycycline (VIBRA-TABS) 100 MG tablet; Take 1 tablet (100 mg total) by mouth 2 (two) times daily for  7 days.  Dispense: 14 tablet; Refill: 0  Return if symptoms worsen or fail to improve.  The entirety of the information documented in the History of Present Illness, Review of Systems and Physical Exam were personally obtained by me. Portions of this information were initially documented by Ashley Royalty, CMA and reviewed by me for thoroughness and accuracy.        Trinna Post, PA-C  [facility]

## 2017-12-27 NOTE — Patient Instructions (Signed)

## 2017-12-29 LAB — URINE CULTURE

## 2018-01-01 ENCOUNTER — Ambulatory Visit
Admission: RE | Admit: 2018-01-01 | Discharge: 2018-01-01 | Disposition: A | Discharge status: Home / Self Care | Payer: Medicare HMO | Source: Ambulatory Visit | Attending: Family Medicine | Admitting: Family Medicine

## 2018-01-01 ENCOUNTER — Telehealth: Payer: Self-pay

## 2018-01-01 DIAGNOSIS — M069 Rheumatoid arthritis, unspecified: Secondary | ICD-10-CM | POA: Insufficient documentation

## 2018-01-01 DIAGNOSIS — M8589 Other specified disorders of bone density and structure, multiple sites: Secondary | ICD-10-CM | POA: Diagnosis not present

## 2018-01-01 DIAGNOSIS — S32010S Wedge compression fracture of first lumbar vertebra, sequela: Secondary | ICD-10-CM | POA: Diagnosis not present

## 2018-01-01 DIAGNOSIS — M85852 Other specified disorders of bone density and structure, left thigh: Secondary | ICD-10-CM | POA: Insufficient documentation

## 2018-01-01 DIAGNOSIS — Z780 Asymptomatic menopausal state: Secondary | ICD-10-CM | POA: Insufficient documentation

## 2018-01-01 DIAGNOSIS — X58XXXS Exposure to other specified factors, sequela: Secondary | ICD-10-CM | POA: Diagnosis not present

## 2018-01-01 DIAGNOSIS — M85831 Other specified disorders of bone density and structure, right forearm: Secondary | ICD-10-CM | POA: Insufficient documentation

## 2018-01-01 NOTE — Telephone Encounter (Signed)
Pt advised.   Thanks,   -Milianna Ericsson  

## 2018-01-01 NOTE — Telephone Encounter (Signed)
LMTCB 01/01/2018  Thanks,   -Katarina Riebe  

## 2018-01-01 NOTE — Telephone Encounter (Signed)
Pt returned call. Thanks TNP °

## 2018-01-01 NOTE — Telephone Encounter (Signed)
-----   Message from Trinna Post, [place] sent at 12/31/2017 12:56 PM EST ----- No growth on urine culture, no sign of infection.

## 2018-01-03 ENCOUNTER — Telehealth: Payer: Self-pay | Admitting: Emergency Medicine

## 2018-01-03 NOTE — Telephone Encounter (Signed)
Pt informed and voiced understanding of results. 

## 2018-01-03 NOTE — Telephone Encounter (Signed)
LMTCB

## 2018-01-03 NOTE — Telephone Encounter (Signed)
-----   Message from Jerrol Banana., MD sent at 01/03/2018 12:36 PM EST ----- Borderline osteoporosis/osteopenia.  Recommend taking vitamin D daily.  Repeat BMD in 2 years

## 2018-01-15 DIAGNOSIS — M0589 Other rheumatoid arthritis with rheumatoid factor of multiple sites: Secondary | ICD-10-CM | POA: Diagnosis not present

## 2018-01-15 DIAGNOSIS — M0579 Rheumatoid arthritis with rheumatoid factor of multiple sites without organ or systems involvement: Secondary | ICD-10-CM | POA: Diagnosis not present

## 2018-01-17 DIAGNOSIS — G4752 REM sleep behavior disorder: Secondary | ICD-10-CM | POA: Diagnosis not present

## 2018-01-17 DIAGNOSIS — G629 Polyneuropathy, unspecified: Secondary | ICD-10-CM | POA: Diagnosis not present

## 2018-01-17 DIAGNOSIS — R69 Illness, unspecified: Secondary | ICD-10-CM | POA: Diagnosis not present

## 2018-01-17 DIAGNOSIS — G20 Parkinson's disease: Secondary | ICD-10-CM | POA: Diagnosis not present

## 2018-01-17 DIAGNOSIS — G301 Alzheimer's disease with late onset: Secondary | ICD-10-CM | POA: Diagnosis not present

## 2018-01-30 ENCOUNTER — Telehealth: Payer: Self-pay | Admitting: Family Medicine

## 2018-02-12 DIAGNOSIS — M0579 Rheumatoid arthritis with rheumatoid factor of multiple sites without organ or systems involvement: Secondary | ICD-10-CM | POA: Diagnosis not present

## 2018-02-27 DIAGNOSIS — M79675 Pain in left toe(s): Secondary | ICD-10-CM | POA: Diagnosis not present

## 2018-02-27 DIAGNOSIS — B351 Tinea unguium: Secondary | ICD-10-CM | POA: Diagnosis not present

## 2018-02-27 DIAGNOSIS — M19072 Primary osteoarthritis, left ankle and foot: Secondary | ICD-10-CM | POA: Diagnosis not present

## 2018-02-27 DIAGNOSIS — M79674 Pain in right toe(s): Secondary | ICD-10-CM | POA: Diagnosis not present

## 2018-03-12 DIAGNOSIS — M0579 Rheumatoid arthritis with rheumatoid factor of multiple sites without organ or systems involvement: Secondary | ICD-10-CM | POA: Diagnosis not present

## 2018-03-21 NOTE — Telephone Encounter (Signed)
complete

## 2018-04-01 DIAGNOSIS — H0019 Chalazion unspecified eye, unspecified eyelid: Secondary | ICD-10-CM | POA: Diagnosis not present

## 2018-04-15 DIAGNOSIS — M810 Age-related osteoporosis without current pathological fracture: Secondary | ICD-10-CM | POA: Diagnosis not present

## 2018-04-15 DIAGNOSIS — M0589 Other rheumatoid arthritis with rheumatoid factor of multiple sites: Secondary | ICD-10-CM | POA: Diagnosis not present

## 2018-04-15 DIAGNOSIS — M0579 Rheumatoid arthritis with rheumatoid factor of multiple sites without organ or systems involvement: Secondary | ICD-10-CM | POA: Diagnosis not present

## 2018-04-15 DIAGNOSIS — G20 Parkinson's disease: Secondary | ICD-10-CM | POA: Diagnosis not present

## 2018-04-18 DIAGNOSIS — G4752 REM sleep behavior disorder: Secondary | ICD-10-CM | POA: Diagnosis not present

## 2018-04-18 DIAGNOSIS — R69 Illness, unspecified: Secondary | ICD-10-CM | POA: Diagnosis not present

## 2018-04-18 DIAGNOSIS — G629 Polyneuropathy, unspecified: Secondary | ICD-10-CM | POA: Diagnosis not present

## 2018-04-18 DIAGNOSIS — G20 Parkinson's disease: Secondary | ICD-10-CM | POA: Diagnosis not present

## 2018-04-18 DIAGNOSIS — G301 Alzheimer's disease with late onset: Secondary | ICD-10-CM | POA: Diagnosis not present

## 2018-05-06 ENCOUNTER — Other Ambulatory Visit: Payer: Self-pay | Admitting: Family Medicine

## 2018-05-08 DIAGNOSIS — R69 Illness, unspecified: Secondary | ICD-10-CM | POA: Diagnosis not present

## 2018-05-09 DIAGNOSIS — M810 Age-related osteoporosis without current pathological fracture: Secondary | ICD-10-CM | POA: Diagnosis not present

## 2018-05-14 DIAGNOSIS — M0579 Rheumatoid arthritis with rheumatoid factor of multiple sites without organ or systems involvement: Secondary | ICD-10-CM | POA: Diagnosis not present

## 2018-05-15 DIAGNOSIS — R69 Illness, unspecified: Secondary | ICD-10-CM | POA: Diagnosis not present

## 2018-05-22 ENCOUNTER — Ambulatory Visit (INDEPENDENT_AMBULATORY_CARE_PROVIDER_SITE_OTHER): Payer: Medicare HMO

## 2018-05-22 ENCOUNTER — Ambulatory Visit (INDEPENDENT_AMBULATORY_CARE_PROVIDER_SITE_OTHER): Payer: Medicare HMO | Admitting: Family Medicine

## 2018-05-22 VITALS — BP 118/52 | HR 88 | Temp 99.0°F | Ht 64.0 in | Wt 156.0 lb

## 2018-05-22 DIAGNOSIS — N3281 Overactive bladder: Secondary | ICD-10-CM

## 2018-05-22 DIAGNOSIS — Z0000 Encounter for general adult medical examination without abnormal findings: Secondary | ICD-10-CM

## 2018-05-22 DIAGNOSIS — E782 Mixed hyperlipidemia: Secondary | ICD-10-CM

## 2018-05-22 DIAGNOSIS — G20 Parkinson's disease: Secondary | ICD-10-CM | POA: Diagnosis not present

## 2018-05-22 DIAGNOSIS — R5381 Other malaise: Secondary | ICD-10-CM | POA: Diagnosis not present

## 2018-05-22 DIAGNOSIS — R5383 Other fatigue: Secondary | ICD-10-CM | POA: Diagnosis not present

## 2018-05-22 DIAGNOSIS — M069 Rheumatoid arthritis, unspecified: Secondary | ICD-10-CM

## 2018-05-22 MED ORDER — OXYBUTYNIN CHLORIDE ER 10 MG PO TB24: 10.0000 mg | ORAL_TABLET | Freq: Every day | ORAL | 3 refills | Status: DC
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
## 2018-05-22 NOTE — Patient Instructions (Addendum)
Morgan Dennis , Thank you for taking time to come for your Medicare Wellness Visit. I appreciate your ongoing commitment to your health goals. Please review the following plan we discussed and let me know if I can assist you in the future.   Screening recommendations/referrals: Colonoscopy: Up to date Mammogram: Up to date Bone Density: Up to date Recommended yearly ophthalmology/optometry visit for glaucoma screening and checkup Recommended yearly dental visit for hygiene and checkup  Vaccinations: Influenza vaccine: N/A Pneumococcal vaccine: Up to date Tdap vaccine: Up to date Shingles vaccine: Pt declines today.     Advanced directives: Please bring a copy of your POA (Power of Attorney) and/or Living Will to your next appointment.   Conditions/risks identified: Recommend increasing water intake to 4-6 glasses a day.   Next appointment: 2:20 PM today with Dr Rosanna Randy.   Preventive Care 27 Years and Older, Female Preventive care refers to lifestyle choices and visits with your health care provider that can promote health and wellness. What does preventive care include?  A yearly physical exam. This is also called an annual well check.  Dental exams once or twice a year.  Routine eye exams. Ask your health care provider how often you should have your eyes checked.  Personal lifestyle choices, including:  Daily care of your teeth and gums.  Regular physical activity.  Eating a healthy diet.  Avoiding tobacco and drug use.  Limiting alcohol use.  Practicing safe sex.  Taking low-dose aspirin every day.  Taking vitamin and mineral supplements as recommended by your health care provider. What happens during an annual well check? The services and screenings done by your health care provider during your annual well check will depend on your age, overall health, lifestyle risk factors, and family history of disease. Counseling  Your health care provider may ask you questions  about your:  Alcohol use.  Tobacco use.  Drug use.  Emotional well-being.  Home and relationship well-being.  Sexual activity.  Eating habits.  History of falls.  Memory and ability to understand (cognition).  Work and work Statistician.  Reproductive health. Screening  You may have the following tests or measurements:  Height, weight, and BMI.  Blood pressure.  Lipid and cholesterol levels. These may be checked every 5 years, or more frequently if you are over 49 years old.  Skin check.  Lung cancer screening. You may have this screening every year starting at age 38 if you have a 30-pack-year history of smoking and currently smoke or have quit within the past 15 years.  Fecal occult blood test (FOBT) of the stool. You may have this test every year starting at age 72.  Flexible sigmoidoscopy or colonoscopy. You may have a sigmoidoscopy every 5 years or a colonoscopy every 10 years starting at age 44.  Hepatitis C blood test.  Hepatitis B blood test.  Sexually transmitted disease (STD) testing.  Diabetes screening. This is done by checking your blood sugar (glucose) after you have not eaten for a while (fasting). You may have this done every 1-3 years.  Bone density scan. This is done to screen for osteoporosis. You may have this done starting at age 32.  Mammogram. This may be done every 1-2 years. Talk to your health care provider about how often you should have regular mammograms. Talk with your health care provider about your test results, treatment options, and if necessary, the need for more tests. Vaccines  Your health care provider may recommend certain vaccines, such  as:  Influenza vaccine. This is recommended every year.  Tetanus, diphtheria, and acellular pertussis (Tdap, Td) vaccine. You may need a Td booster every 10 years.  Zoster vaccine. You may need this after age 11.  Pneumococcal 13-valent conjugate (PCV13) vaccine. One dose is  recommended after age 36.  Pneumococcal polysaccharide (PPSV23) vaccine. One dose is recommended after age 64. Talk to your health care provider about which screenings and vaccines you need and how often you need them. This information is not intended to replace advice given to you by your health care provider. Make sure you discuss any questions you have with your health care provider. Document Released: 12/31/2015 Document Revised: 08/23/2016 Document Reviewed: 10/05/2015 Elsevier Interactive Patient Education  2017 [place] Prevention in the Home Falls can cause injuries. They can happen to people of all ages. There are many things you can do to make your home safe and to help prevent falls. What can I do on the outside of my home?  Regularly fix the edges of walkways and driveways and fix any cracks.  Remove anything that might make you trip as you walk through a door, such as a raised step or threshold.  Trim any bushes or trees on the path to your home.  Use bright outdoor lighting.  Clear any walking paths of anything that might make someone trip, such as rocks or tools.  Regularly check to see if handrails are loose or broken. Make sure that both sides of any steps have handrails.  Any raised decks and porches should have guardrails on the edges.  Have any leaves, snow, or ice cleared regularly.  Use sand or salt on walking paths during winter.  Clean up any spills in your garage right away. This includes oil or grease spills. What can I do in the bathroom?  Use night lights.  Install grab bars by the toilet and in the tub and shower. Do not use towel bars as grab bars.  Use non-skid mats or decals in the tub or shower.  If you need to sit down in the shower, use a plastic, non-slip stool.  Keep the floor dry. Clean up any water that spills on the floor as soon as it happens.  Remove soap buildup in the tub or shower regularly.  Attach bath mats  securely with double-sided non-slip rug tape.  Do not have throw rugs and other things on the floor that can make you trip. What can I do in the bedroom?  Use night lights.  Make sure that you have a light by your bed that is easy to reach.  Do not use any sheets or blankets that are too big for your bed. They should not hang down onto the floor.  Have a firm chair that has side arms. You can use this for support while you get dressed.  Do not have throw rugs and other things on the floor that can make you trip. What can I do in the kitchen?  Clean up any spills right away.  Avoid walking on wet floors.  Keep items that you use a lot in easy-to-reach places.  If you need to reach something above you, use a strong step stool that has a grab bar.  Keep electrical cords out of the way.  Do not use floor polish or wax that makes floors slippery. If you must use wax, use non-skid floor wax.  Do not have throw rugs and other things on the  floor that can make you trip. What can I do with my stairs?  Do not leave any items on the stairs.  Make sure that there are handrails on both sides of the stairs and use them. Fix handrails that are broken or loose. Make sure that handrails are as long as the stairways.  Check any carpeting to make sure that it is firmly attached to the stairs. Fix any carpet that is loose or worn.  Avoid having throw rugs at the top or bottom of the stairs. If you do have throw rugs, attach them to the floor with carpet tape.  Make sure that you have a light switch at the top of the stairs and the bottom of the stairs. If you do not have them, ask someone to add them for you. What else can I do to help prevent falls?  Wear shoes that:  Do not have high heels.  Have rubber bottoms.  Are comfortable and fit you well.  Are closed at the toe. Do not wear sandals.  If you use a stepladder:  Make sure that it is fully opened. Do not climb a closed  stepladder.  Make sure that both sides of the stepladder are locked into place.  Ask someone to hold it for you, if possible.  Clearly mark and make sure that you can see:  Any grab bars or handrails.  First and last steps.  Where the edge of each step is.  Use tools that help you move around (mobility aids) if they are needed. These include:  Canes.  Walkers.  Scooters.  Crutches.  Turn on the lights when you go into a dark area. Replace any light bulbs as soon as they burn out.  Set up your furniture so you have a clear path. Avoid moving your furniture around.  If any of your floors are uneven, fix them.  If there are any pets around you, be aware of where they are.  Review your medicines with your doctor. Some medicines can make you feel dizzy. This can increase your chance of falling. Ask your doctor what other things that you can do to help prevent falls. This information is not intended to replace advice given to you by your health care provider. Make sure you discuss any questions you have with your health care provider. Document Released: 09/30/2009 Document Revised: 05/11/2016 Document Reviewed: 01/08/2015 Elsevier Interactive Patient Education  2017 [employer].

## 2018-05-23 LAB — CBC WITH DIFFERENTIAL/PLATELET
BASOS: 0 %
Basophils Absolute: 0 10*3/uL (ref 0.0–0.2)
EOS (ABSOLUTE): 0.3 10*3/uL (ref 0.0–0.4)
Eos: 4 %
HEMATOCRIT: 42.3 % (ref 34.0–46.6)
Hemoglobin: 13.9 g/dL (ref 11.1–15.9)
Immature Grans (Abs): 0 10*3/uL (ref 0.0–0.1)
Immature Granulocytes: 0 %
LYMPHS ABS: 2.9 10*3/uL (ref 0.7–3.1)
LYMPHS: 40 %
MCH: 32 pg (ref 26.6–33.0)
MCHC: 32.9 g/dL (ref 31.5–35.7)
MCV: 97 fL (ref 79–97)
MONOCYTES: 11 %
Monocytes Absolute: 0.8 10*3/uL (ref 0.1–0.9)
NEUTROS ABS: 3.3 10*3/uL (ref 1.4–7.0)
Neutrophils: 45 %
Platelets: 179 10*3/uL (ref 150–450)
RBC: 4.35 x10E6/uL (ref 3.77–5.28)
RDW: 13 % (ref 12.3–15.4)
WBC: 7.3 10*3/uL (ref 3.4–10.8)

## 2018-05-23 LAB — COMPREHENSIVE METABOLIC PANEL
A/G RATIO: 1.8 (ref 1.2–2.2)
ALBUMIN: 4.3 g/dL (ref 3.5–4.7)
ALK PHOS: 82 IU/L (ref 39–117)
ALT: 10 IU/L (ref 0–32)
AST: 17 IU/L (ref 0–40)
BILIRUBIN TOTAL: 0.2 mg/dL (ref 0.0–1.2)
BUN / CREAT RATIO: 32 — AB (ref 12–28)
BUN: 18 mg/dL (ref 8–27)
CHLORIDE: 100 mmol/L (ref 96–106)
CO2: 26 mmol/L (ref 20–29)
Calcium: 9.2 mg/dL (ref 8.7–10.3)
Creatinine, Ser: 0.57 mg/dL (ref 0.57–1.00)
GFR calc Af Amer: 101 mL/min/{1.73_m2} (ref >59)
GFR calc non Af Amer: 87 mL/min/{1.73_m2} (ref >59)
GLOBULIN, TOTAL: 2.4 g/dL (ref 1.5–4.5)
Glucose: 92 mg/dL (ref 65–99)
POTASSIUM: 4 mmol/L (ref 3.5–5.2)
SODIUM: 141 mmol/L (ref 134–144)
Total Protein: 6.7 g/dL (ref 6.0–8.5)

## 2018-05-23 LAB — TSH: TSH: 1.63 u[IU]/mL (ref 0.450–4.500)

## 2018-05-23 NOTE — Progress Notes (Signed)
Advised  ED 

## 2018-06-11 DIAGNOSIS — M0579 Rheumatoid arthritis with rheumatoid factor of multiple sites without organ or systems involvement: Secondary | ICD-10-CM | POA: Diagnosis not present

## 2018-07-09 DIAGNOSIS — M0589 Other rheumatoid arthritis with rheumatoid factor of multiple sites: Secondary | ICD-10-CM | POA: Diagnosis not present

## 2018-07-09 DIAGNOSIS — M0579 Rheumatoid arthritis with rheumatoid factor of multiple sites without organ or systems involvement: Secondary | ICD-10-CM | POA: Diagnosis not present

## 2018-08-06 DIAGNOSIS — M0579 Rheumatoid arthritis with rheumatoid factor of multiple sites without organ or systems involvement: Secondary | ICD-10-CM | POA: Diagnosis not present

## 2018-08-07 DIAGNOSIS — M0579 Rheumatoid arthritis with rheumatoid factor of multiple sites without organ or systems involvement: Secondary | ICD-10-CM | POA: Diagnosis not present

## 2018-08-07 DIAGNOSIS — M7062 Trochanteric bursitis, left hip: Secondary | ICD-10-CM | POA: Diagnosis not present

## 2018-08-21 ENCOUNTER — Ambulatory Visit (INDEPENDENT_AMBULATORY_CARE_PROVIDER_SITE_OTHER): Payer: Medicare HMO | Admitting: Family Medicine

## 2018-08-21 ENCOUNTER — Encounter: Payer: Self-pay | Admitting: Family Medicine

## 2018-08-21 VITALS — BP 122/58 | HR 84 | Temp 98.5°F | Resp 20 | Wt 156.0 lb

## 2018-08-21 DIAGNOSIS — M79604 Pain in right leg: Secondary | ICD-10-CM

## 2018-08-21 DIAGNOSIS — G20 Parkinson's disease: Secondary | ICD-10-CM

## 2018-08-21 DIAGNOSIS — N3281 Overactive bladder: Secondary | ICD-10-CM

## 2018-08-21 DIAGNOSIS — M069 Rheumatoid arthritis, unspecified: Secondary | ICD-10-CM

## 2018-08-21 DIAGNOSIS — F09 Unspecified mental disorder due to known physiological condition: Secondary | ICD-10-CM

## 2018-08-21 DIAGNOSIS — Z1231 Encounter for screening mammogram for malignant neoplasm of breast: Secondary | ICD-10-CM

## 2018-08-21 DIAGNOSIS — R69 Illness, unspecified: Secondary | ICD-10-CM | POA: Diagnosis not present

## 2018-08-21 DIAGNOSIS — Z1239 Encounter for other screening for malignant neoplasm of breast: Secondary | ICD-10-CM

## 2018-08-21 DIAGNOSIS — F324 Major depressive disorder, single episode, in partial remission: Secondary | ICD-10-CM

## 2018-08-21 MED ORDER — KETOROLAC TROMETHAMINE 30 MG/ML IJ SOLN: 30.0000 mg | Freq: Once | INTRAMUSCULAR | Status: AC

## 2018-08-21 NOTE — Progress Notes (Signed)
Patient: Morgan Dennis Female    DOB: 21-Apr-1934   84 y.o.   MRN: [id number] Visit Date: 08/21/2018  Today's Provider: [name], MD   Chief Complaint  Patient presents with  . Follow-up  . Leg Pain   Subjective:    HPI Patient comes in today for a follow up. She was last seen in the office 2 months ago. She was advised to start on Vesicare for OAB, but it was too expensive. It was recommended that she try Ditropan XL 10mg . Patient can not tell a difference in her symptoms since starting the medication.  Patient also mentions that she has had right leg pain X 1 week. She reports that the pain sometimes radiates to her right hip. She denies any injuries. She has not taken anything OTC for her symptoms.  Sees Dr Duard Brady for RA. She is having progressive dementia without behavioral issues.     Allergies  Allergen Reactions  . Penicillins Swelling and Rash    Other reaction(s): Rash Has patient had a PCN reaction causing immediate rash, facial/tongue/throat swelling, SOB or lightheadedness with hypotension: Yes Has patient had a PCN reaction causing severe rash involving mucus membranes or skin necrosis: Unknown Has patient had a PCN reaction that required hospitalization:Yes Has patient had a PCN reaction occurring within the last 10 years: No If all of the above answers are "NO", then may proceed with Cephalosporin use.   . Evista  [Raloxifene]     Other reaction(s): Joint Pains  . Ibandronic Acid     Other reaction(s): Muscle Pain  . Remicade [Infliximab] Other (See Comments)    Due to bleeding ulcer issue--MD advised her not to take  . Risedronate Sodium     Other reaction(s): Joint Pains     Current Outpatient Medications:  .  abatacept (ORENCIA) 250 MG injection, Inject 125 mg into the vein every 30 (thirty) days. , Disp: , Rfl:  .  acetaminophen (TYLENOL) 325 MG tablet, Take 2 tablets (650 mg total) by mouth every 6 (six) hours as needed for mild pain (or  Fever >/= 101)., Disp: , Rfl:  .  ascorbic acid (VITAMIN C) 1000 MG tablet, Take 1,000 mg by mouth daily., Disp: , Rfl:  .  CALCIUM PO, Take 2,000 mg by mouth daily., Disp: , Rfl:  .  carbidopa-levodopa (SINEMET IR) 25-100 MG tablet, Take 1 tablet by mouth 3 (three) times daily. , Disp: , Rfl:  .  Cholecalciferol (VITAMIN D3) 2000 UNITS TABS, Take 2,000 Units by mouth daily. , Disp: , Rfl:  .  Docusate Sodium (DSS) 100 MG CAPS, TAKE ONE (1) CAPSULE BY MOUTH 2 TIMES DAILY AS NEEDED FOR CONSTIPATION., Disp: , Rfl:  .  donepezil (ARICEPT) 10 MG tablet, Take 10 mg by mouth daily with supper. , Disp: , Rfl:  .  DULoxetine (CYMBALTA) 60 MG capsule, Take 60 mg by mouth daily., Disp: , Rfl:  .  folic acid (FOLVITE) 1 MG tablet, Take 1 mg by mouth daily., Disp: , Rfl:  .  gabapentin (NEURONTIN) 300 MG capsule, Take 600 mg by mouth 3 (three) times daily. , Disp: , Rfl:  .  Melatonin 5 MG TABS, Take 5 mg by mouth at bedtime. , Disp: , Rfl:  .  memantine (NAMENDA) 5 MG tablet, Take 1 tablet (5 mg total) by mouth 2 (two) times daily. (Patient taking differently: Take 5 mg by mouth 2 (two) times daily. In the morning and with  supper), Disp: 180 tablet, Rfl: 3 .  methotrexate 2.5 MG tablet, Take 15 mg by mouth every Wednesday. , Disp: , Rfl:  .  Multiple Vitamin (MULTIVITAMIN WITH MINERALS) TABS tablet, Take 1 tablet by mouth daily., Disp: , Rfl:  .  neomycin-polymyxin-dexamethasone (MAXITROL) 0.1 % ophthalmic suspension, Place 1 drop into both eyes daily., Disp: , Rfl:  .  Omega-3 Fatty Acids (FISH OIL) 1000 MG CAPS, Take 1 capsule (1,000 mg total) by mouth daily. (Patient taking differently: Take 1,000 mg by mouth daily. ), Disp: , Rfl: 0 .  omeprazole (PRILOSEC) 20 MG capsule, TAKE ONE CAPSULE DAILY., Disp: 30 capsule, Rfl: 11 .  oxybutynin (DITROPAN-XL) 10 MG 24 hr tablet, Take 1 tablet (10 mg total) by mouth at bedtime., Disp: 90 tablet, Rfl: 3 .  polyethylene glycol powder (GLYCOLAX/MIRALAX) powder, Take 17  g by mouth daily as needed (FOR CONSTIPATION)., Disp: , Rfl:  .  vitamin E 400 UNIT capsule, Take 400 Units by mouth daily., Disp: , Rfl:  .  HYDROcodone-acetaminophen (NORCO) 5-325 MG tablet, Take 1 tablet by mouth every 6 (six) hours as needed for moderate pain. (Patient not taking: Reported on 05/22/2018), Disp: 15 tablet, Rfl: 0 .  predniSONE (DELTASONE) 20 MG tablet, Take one pill twice daily with food (Patient not taking: Reported on 05/22/2018), Disp: 14 tablet, Rfl: 0 .  traMADol (ULTRAM) 50 MG tablet, Take 1 tablet (50 mg total) by mouth 2 (two) times daily. (Patient not taking: Reported on 05/22/2018), Disp: 10 tablet, Rfl: 0  Review of Systems  Constitutional: Positive for activity change. Negative for appetite change, chills, diaphoresis, fatigue and fever.  HENT: Negative.   Eyes: Negative.   Respiratory: Negative.   Cardiovascular: Positive for leg swelling. Negative for chest pain and palpitations.  Endocrine: Negative.   Musculoskeletal: Positive for arthralgias, back pain and myalgias.  Skin: Negative.   Neurological: Negative for dizziness and numbness.  Psychiatric/Behavioral: Negative.     Social History   Tobacco Use  . Smoking status: Never Smoker  . Smokeless tobacco: Never Used  Substance Use Topics  . Alcohol use: No   Objective:   BP (!) 122/58 (BP Location: Right Arm, Patient Position: Sitting, Cuff Size: Normal)   Pulse 84   Temp 98.5 F (36.9 C)   Resp 20   Wt 156 lb (70.8 kg)   SpO2 94%   BMI 26.78 kg/m  Vitals:   08/21/18 1428  BP: (!) 122/58  Pulse: 84  Resp: 20  Temp: 98.5 F (36.9 C)  SpO2: 94%  Weight: 156 lb (70.8 kg)     Physical Exam  Constitutional: She is oriented to person, place, and time. She appears well-developed and well-nourished.  HENT:  Head: Normocephalic and atraumatic.  Eyes: Conjunctivae are normal. No scleral icterus.  Neck: No thyromegaly present.  Cardiovascular: Normal rate, regular rhythm and normal heart  sounds.  Pulmonary/Chest: Effort normal and breath sounds normal.  Abdominal: Soft.  Musculoskeletal: She exhibits no edema.  Lymphadenopathy:    She has no cervical adenopathy.  Neurological: She is alert and oriented to person, place, and time.  Skin: Skin is warm and dry.  Psychiatric: She has a normal mood and affect. Her behavior is normal. Judgment normal.        Assessment & Plan:      1. Overactive bladder   2. Screening for breast cancer  - MM 3D SCREEN BREAST BILATERAL; Future  3. Pain of right lower extremity Sciatica vs Trochanteric bursitis. -  ketorolac (TORADOL) 30 MG/ML injection 30 mg  4. Parkinson's disease ([place])   5. Rheumatoid arthritis involving multiple sites, unspecified rheumatoid factor presence ([place])   6. Major depressive disorder with single episode, in partial remission ([place])   7. Mild cognitive disorder Husband says she seems stable--no behavioral issues. More than 50% of 25 minute visit spent in counseling. 8.HM Pt should get shingrex and flu shot.     I have done the exam and reviewed the above chart and it is accurate to the best of my knowledge. Development worker, community has been used in this note in any air is in the dictation or transcription are unintentional.  [name], MD  [place]

## 2018-08-21 NOTE — Patient Instructions (Signed)
Mammogram scheduled for 09/03/2018 at 1:20pm at [facility].

## 2018-08-22 ENCOUNTER — Ambulatory Visit: Payer: Self-pay | Admitting: Family Medicine

## 2018-09-03 ENCOUNTER — Ambulatory Visit
Admission: RE | Admit: 2018-09-03 | Discharge: 2018-09-03 | Disposition: A | Discharge status: Home / Self Care | Payer: Medicare HMO | Source: Ambulatory Visit | Attending: Family Medicine | Admitting: Family Medicine

## 2018-09-03 DIAGNOSIS — Z1239 Encounter for other screening for malignant neoplasm of breast: Secondary | ICD-10-CM

## 2018-09-03 DIAGNOSIS — M0579 Rheumatoid arthritis with rheumatoid factor of multiple sites without organ or systems involvement: Secondary | ICD-10-CM | POA: Diagnosis not present

## 2018-09-03 DIAGNOSIS — Z1231 Encounter for screening mammogram for malignant neoplasm of breast: Secondary | ICD-10-CM | POA: Insufficient documentation

## 2018-09-24 DIAGNOSIS — R69 Illness, unspecified: Secondary | ICD-10-CM | POA: Diagnosis not present

## 2018-10-01 DIAGNOSIS — M0589 Other rheumatoid arthritis with rheumatoid factor of multiple sites: Secondary | ICD-10-CM | POA: Diagnosis not present

## 2018-10-01 DIAGNOSIS — M0579 Rheumatoid arthritis with rheumatoid factor of multiple sites without organ or systems involvement: Secondary | ICD-10-CM | POA: Diagnosis not present

## 2018-10-11 ENCOUNTER — Ambulatory Visit (INDEPENDENT_AMBULATORY_CARE_PROVIDER_SITE_OTHER): Payer: Medicare HMO | Admitting: Physician Assistant

## 2018-10-11 ENCOUNTER — Encounter: Payer: Self-pay | Admitting: Physician Assistant

## 2018-10-11 VITALS — BP 110/60 | HR 89 | Temp 98.5°F | Resp 16 | Wt 156.0 lb

## 2018-10-11 DIAGNOSIS — R110 Nausea: Secondary | ICD-10-CM

## 2018-10-11 DIAGNOSIS — R300 Dysuria: Secondary | ICD-10-CM

## 2018-10-11 LAB — POCT URINALYSIS DIPSTICK
Appearance: NORMAL
Bilirubin, UA: NEGATIVE
Blood, UA: NEGATIVE
Glucose, UA: NEGATIVE
Ketones, UA: NEGATIVE
Leukocytes, UA: NEGATIVE
Nitrite, UA: NEGATIVE
Odor: NEGATIVE
Protein, UA: NEGATIVE
Spec Grav, UA: >=1.03 — AB (ref 1.010–1.025)
Urobilinogen, UA: 0.2 E.U./dL
pH, UA: 6 (ref 5.0–8.0)

## 2018-10-11 MED ORDER — ONDANSETRON HCL 4 MG PO TABS: 4.0000 mg | ORAL_TABLET | Freq: Three times a day (TID) | ORAL | 0 refills | Status: DC | PRN

## 2018-10-11 NOTE — Progress Notes (Signed)
Patient: Morgan Dennis Female    DOB: [birth year]   84 y.o.   MRN: [id number] Visit Date: 10/11/2018  Today's Provider: Trinna Post, PA-C   Chief Complaint  Patient presents with  . Fatigue   Subjective:    HPI Patient has a history of Parkinson's, rheumatoid arthritis and alzheimer's who is here today c/o "strange feeling" on the back of her neck x's week. Patient denies head ache or dizziness or visual disturbance. Patient reports feeling off balance and fatigue, symptoms are about the same as first started. She denies dysuria. Does have urinary frequency and overactive bladder at baseline.  She is followed by Dr. Manuella Ghazi and Rufina Falco, NP at [facility] for Parkinson's Disease. She is currently on Sinemet. She has noted imbalance and tremors on exam from 04/18/2018. She is also being treated for dementia with aricept.   She is followed by Dr. Cristi Loron for rheumatoid arthritis and receives orencia infusion, last infusion 10/01/2018.     Allergies  Allergen Reactions  . Penicillins Swelling and Rash    Other reaction(s): Rash Has patient had a PCN reaction causing immediate rash, facial/tongue/throat swelling, SOB or lightheadedness with hypotension: Yes Has patient had a PCN reaction causing severe rash involving mucus membranes or skin necrosis: Unknown Has patient had a PCN reaction that required hospitalization:Yes Has patient had a PCN reaction occurring within the last 10 years: No If all of the above answers are "NO", then may proceed with Cephalosporin use.   . Evista  [Raloxifene]     Other reaction(s): Joint Pains  . Ibandronic Acid     Other reaction(s): Muscle Pain  . Remicade [Infliximab] Other (See Comments)    Due to bleeding ulcer issue--MD advised her not to take  . Risedronate Sodium     Other reaction(s): Joint Pains     Current Outpatient Medications:  .  abatacept (ORENCIA) 250 MG injection, Inject 125 mg into the vein every 30  (thirty) days. , Disp: , Rfl:  .  acetaminophen (TYLENOL) 325 MG tablet, Take 2 tablets (650 mg total) by mouth every 6 (six) hours as needed for mild pain (or Fever >/= 101)., Disp: , Rfl:  .  ascorbic acid (VITAMIN C) 1000 MG tablet, Take 1,000 mg by mouth daily., Disp: , Rfl:  .  CALCIUM PO, Take 2,000 mg by mouth daily., Disp: , Rfl:  .  carbidopa-levodopa (SINEMET IR) 25-100 MG tablet, Take 1 tablet by mouth 3 (three) times daily. , Disp: , Rfl:  .  Cholecalciferol (VITAMIN D3) 2000 UNITS TABS, Take 2,000 Units by mouth daily. , Disp: , Rfl:  .  Docusate Sodium (DSS) 100 MG CAPS, TAKE ONE (1) CAPSULE BY MOUTH 2 TIMES DAILY AS NEEDED FOR CONSTIPATION., Disp: , Rfl:  .  donepezil (ARICEPT) 10 MG tablet, Take 10 mg by mouth daily with supper. , Disp: , Rfl:  .  DULoxetine (CYMBALTA) 60 MG capsule, Take 60 mg by mouth daily., Disp: , Rfl:  .  folic acid (FOLVITE) 1 MG tablet, Take 1 mg by mouth daily., Disp: , Rfl:  .  gabapentin (NEURONTIN) 300 MG capsule, Take 600 mg by mouth 3 (three) times daily. , Disp: , Rfl:  .  Melatonin 5 MG TABS, Take 5 mg by mouth at bedtime. , Disp: , Rfl:  .  memantine (NAMENDA) 5 MG tablet, Take 1 tablet (5 mg total) by mouth 2 (two) times daily. (Patient taking differently: Take 5 mg  by mouth 2 (two) times daily. In the morning and with supper), Disp: 180 tablet, Rfl: 3 .  methotrexate 2.5 MG tablet, Take 15 mg by mouth every Wednesday. , Disp: , Rfl:  .  Multiple Vitamin (MULTIVITAMIN WITH MINERALS) TABS tablet, Take 1 tablet by mouth daily., Disp: , Rfl:  .  neomycin-polymyxin-dexamethasone (MAXITROL) 0.1 % ophthalmic suspension, Place 1 drop into both eyes daily., Disp: , Rfl:  .  Omega-3 Fatty Acids (FISH OIL) 1000 MG CAPS, Take 1 capsule (1,000 mg total) by mouth daily. (Patient taking differently: Take 1,000 mg by mouth daily. ), Disp: , Rfl: 0 .  omeprazole (PRILOSEC) 20 MG capsule, TAKE ONE CAPSULE DAILY., Disp: 30 capsule, Rfl: 11 .  oxybutynin  (DITROPAN-XL) 10 MG 24 hr tablet, Take 1 tablet (10 mg total) by mouth at bedtime., Disp: 90 tablet, Rfl: 3 .  vitamin E 400 UNIT capsule, Take 400 Units by mouth daily., Disp: , Rfl:  .  HYDROcodone-acetaminophen (NORCO) 5-325 MG tablet, Take 1 tablet by mouth every 6 (six) hours as needed for moderate pain. (Patient not taking: Reported on 05/22/2018), Disp: 15 tablet, Rfl: 0  Review of Systems  Constitutional: Positive for fatigue.  Eyes: Negative for visual disturbance.  Musculoskeletal: Positive for gait problem, myalgias and neck pain.  Neurological: Negative for dizziness and headaches.    Social History   Tobacco Use  . Smoking status: Never Smoker  . Smokeless tobacco: Never Used  Substance Use Topics  . Alcohol use: No   Objective:   BP 110/60 (BP Location: Left Arm, Patient Position: Sitting, Cuff Size: Normal)   Pulse 89   Temp 98.5 F (36.9 C) (Oral)   Resp 16   Wt 156 lb (70.8 kg)   SpO2 95%   BMI 26.78 kg/m  Vitals:   10/11/18 1033  BP: 110/60  Pulse: 89  Resp: 16  Temp: 98.5 F (36.9 C)  TempSrc: Oral  SpO2: 95%  Weight: 156 lb (70.8 kg)     Physical Exam  Constitutional: She is oriented to person, place, and time. She appears well-developed and well-nourished.  Eyes: Pupils are equal, round, and reactive to light. EOM are normal.  Cardiovascular: Normal rate and regular rhythm.  Pulmonary/Chest: Effort normal and breath sounds normal.  Neurological: She is alert and oriented to person, place, and time. She displays tremor. She displays normal reflexes. No cranial nerve deficit. Gait abnormal.  She does have a resting tremor notable in bilateral upper extremities. She walks with a shuffling gait.   Skin: Skin is warm and dry.  Psychiatric: She has a normal mood and affect. Her behavior is normal.        Assessment & Plan:     1. Nausea  Will get labwork as below. Has a resting tremor which has been documented and likely due to Parkinson's, also  has shuffling gait. No focal neurodeficits however, Urinalysis normal. Unclear etiology of symptoms, perhaps increasing symptoms due to Parkinson's, think it is less likely that she is having stroke. Will get labs as below. Please follow up if worsening.   - CBC with Differential - Comprehensive Metabolic Panel (CMET) - TSH - Lipase - Amylase - ondansetron (ZOFRAN) 4 MG tablet; Take 1 tablet (4 mg total) by mouth every 8 (eight) hours as needed for nausea or vomiting.  Dispense: 20 tablet; Refill: 0  2. Dysuria  - POCT urinalysis dipstick  I have spent 25 minutes with this patient, >50% of which was spent on counseling  and coordination of care.      Trinna Post, PA-C  [facility]

## 2018-10-11 NOTE — Patient Instructions (Signed)

## 2018-10-12 LAB — CBC WITH DIFFERENTIAL/PLATELET
Basophils Absolute: 0 10*3/uL (ref 0.0–0.2)
Basos: 1 %
EOS (ABSOLUTE): 0.2 10*3/uL (ref 0.0–0.4)
Eos: 2 %
Hematocrit: 40.3 % (ref 34.0–46.6)
Hemoglobin: 13.4 g/dL (ref 11.1–15.9)
Immature Grans (Abs): 0 10*3/uL (ref 0.0–0.1)
Immature Granulocytes: 0 %
Lymphocytes Absolute: 2 10*3/uL (ref 0.7–3.1)
Lymphs: 29 %
MCH: 33.2 pg — ABNORMAL HIGH (ref 26.6–33.0)
MCHC: 33.3 g/dL (ref 31.5–35.7)
MCV: 100 fL — ABNORMAL HIGH (ref 79–97)
Monocytes Absolute: 0.7 10*3/uL (ref 0.1–0.9)
Monocytes: 10 %
Neutrophils Absolute: 3.8 10*3/uL (ref 1.4–7.0)
Neutrophils: 58 %
Platelets: 181 10*3/uL (ref 150–450)
RBC: 4.04 x10E6/uL (ref 3.77–5.28)
RDW: 12.9 % (ref 12.3–15.4)
WBC: 6.7 10*3/uL (ref 3.4–10.8)

## 2018-10-12 LAB — COMPREHENSIVE METABOLIC PANEL
ALT: 13 IU/L (ref 0–32)
AST: 24 IU/L (ref 0–40)
Albumin/Globulin Ratio: 2 (ref 1.2–2.2)
Albumin: 4.3 g/dL (ref 3.5–4.7)
Alkaline Phosphatase: 77 IU/L (ref 39–117)
BUN/Creatinine Ratio: 23 (ref 12–28)
BUN: 18 mg/dL (ref 8–27)
Bilirubin Total: 0.4 mg/dL (ref 0.0–1.2)
CO2: 24 mmol/L (ref 20–29)
Calcium: 9.3 mg/dL (ref 8.7–10.3)
Chloride: 102 mmol/L (ref 96–106)
Creatinine, Ser: 0.77 mg/dL (ref 0.57–1.00)
GFR calc Af Amer: 84 mL/min/{1.73_m2} (ref >59)
GFR calc non Af Amer: 73 mL/min/{1.73_m2} (ref >59)
Globulin, Total: 2.2 g/dL (ref 1.5–4.5)
Glucose: 108 mg/dL — ABNORMAL HIGH (ref 65–99)
Potassium: 3.8 mmol/L (ref 3.5–5.2)
Sodium: 142 mmol/L (ref 134–144)
Total Protein: 6.5 g/dL (ref 6.0–8.5)

## 2018-10-12 LAB — TSH: TSH: 2.03 u[IU]/mL (ref 0.450–4.500)

## 2018-10-12 LAB — AMYLASE: Amylase: 68 U/L (ref 31–124)

## 2018-10-12 LAB — LIPASE: Lipase: 14 U/L (ref 14–85)

## 2018-10-14 ENCOUNTER — Telehealth: Payer: Self-pay

## 2018-10-14 NOTE — Telephone Encounter (Signed)
Pt returned call ° °Thanks  teri °

## 2018-10-14 NOTE — Telephone Encounter (Signed)
lmtcb

## 2018-10-14 NOTE — Telephone Encounter (Signed)
Patient advised as below. Patient verbalizes understanding and is in agreement with plan. ° °

## 2018-10-14 NOTE — Telephone Encounter (Signed)
-----   Message from Trinna Post, [place] sent at 10/12/2018  9:48 AM EDT ----- Worthy Keeler stable, urinalysis normal. Symptoms may be progression of her chronic illnesses or possible acute illness. Please follow up with Dr. Rosanna Randy if worsening.

## 2018-10-29 DIAGNOSIS — M0579 Rheumatoid arthritis with rheumatoid factor of multiple sites without organ or systems involvement: Secondary | ICD-10-CM | POA: Diagnosis not present

## 2018-11-12 DIAGNOSIS — M810 Age-related osteoporosis without current pathological fracture: Secondary | ICD-10-CM | POA: Diagnosis not present

## 2018-11-26 DIAGNOSIS — M0579 Rheumatoid arthritis with rheumatoid factor of multiple sites without organ or systems involvement: Secondary | ICD-10-CM | POA: Diagnosis not present

## 2018-11-26 DIAGNOSIS — M810 Age-related osteoporosis without current pathological fracture: Secondary | ICD-10-CM | POA: Diagnosis not present

## 2018-11-26 DIAGNOSIS — G20 Parkinson's disease: Secondary | ICD-10-CM | POA: Diagnosis not present

## 2018-12-23 DIAGNOSIS — M0589 Other rheumatoid arthritis with rheumatoid factor of multiple sites: Secondary | ICD-10-CM | POA: Diagnosis not present

## 2018-12-23 DIAGNOSIS — M0579 Rheumatoid arthritis with rheumatoid factor of multiple sites without organ or systems involvement: Secondary | ICD-10-CM | POA: Diagnosis not present

## 2019-01-13 DIAGNOSIS — H04129 Dry eye syndrome of unspecified lacrimal gland: Secondary | ICD-10-CM | POA: Diagnosis not present

## 2019-01-13 DIAGNOSIS — H26493 Other secondary cataract, bilateral: Secondary | ICD-10-CM | POA: Diagnosis not present

## 2019-01-13 DIAGNOSIS — Z961 Presence of intraocular lens: Secondary | ICD-10-CM | POA: Diagnosis not present

## 2019-01-21 DIAGNOSIS — M0579 Rheumatoid arthritis with rheumatoid factor of multiple sites without organ or systems involvement: Secondary | ICD-10-CM | POA: Diagnosis not present

## 2019-02-18 DIAGNOSIS — M79604 Pain in right leg: Secondary | ICD-10-CM | POA: Diagnosis not present

## 2019-02-18 DIAGNOSIS — M79605 Pain in left leg: Secondary | ICD-10-CM | POA: Diagnosis not present

## 2019-02-18 DIAGNOSIS — M0579 Rheumatoid arthritis with rheumatoid factor of multiple sites without organ or systems involvement: Secondary | ICD-10-CM | POA: Diagnosis not present

## 2019-03-18 DIAGNOSIS — M0589 Other rheumatoid arthritis with rheumatoid factor of multiple sites: Secondary | ICD-10-CM | POA: Diagnosis not present

## 2019-03-18 DIAGNOSIS — M0579 Rheumatoid arthritis with rheumatoid factor of multiple sites without organ or systems involvement: Secondary | ICD-10-CM | POA: Diagnosis not present

## 2019-03-26 ENCOUNTER — Other Ambulatory Visit: Payer: Self-pay

## 2019-03-26 ENCOUNTER — Encounter: Payer: Self-pay | Admitting: Family Medicine

## 2019-03-26 ENCOUNTER — Ambulatory Visit (INDEPENDENT_AMBULATORY_CARE_PROVIDER_SITE_OTHER): Payer: Medicare HMO | Admitting: Family Medicine

## 2019-03-26 VITALS — BP 112/60 | HR 93 | Temp 99.0°F | Ht 62.0 in | Wt 167.4 lb

## 2019-03-26 DIAGNOSIS — M791 Myalgia, unspecified site: Secondary | ICD-10-CM

## 2019-03-26 DIAGNOSIS — M1990 Unspecified osteoarthritis, unspecified site: Secondary | ICD-10-CM | POA: Diagnosis not present

## 2019-03-26 DIAGNOSIS — N390 Urinary tract infection, site not specified: Secondary | ICD-10-CM

## 2019-03-26 DIAGNOSIS — G20 Parkinson's disease: Secondary | ICD-10-CM

## 2019-03-26 DIAGNOSIS — G301 Alzheimer's disease with late onset: Secondary | ICD-10-CM

## 2019-03-26 DIAGNOSIS — R69 Illness, unspecified: Secondary | ICD-10-CM | POA: Diagnosis not present

## 2019-03-26 DIAGNOSIS — M069 Rheumatoid arthritis, unspecified: Secondary | ICD-10-CM | POA: Diagnosis not present

## 2019-03-26 DIAGNOSIS — R5383 Other fatigue: Secondary | ICD-10-CM

## 2019-03-26 DIAGNOSIS — N3281 Overactive bladder: Secondary | ICD-10-CM | POA: Diagnosis not present

## 2019-03-26 DIAGNOSIS — F0280 Dementia in other diseases classified elsewhere without behavioral disturbance: Secondary | ICD-10-CM

## 2019-03-26 MED ORDER — SOLIFENACIN SUCCINATE 5 MG PO TABS: 5.0000 mg | ORAL_TABLET | Freq: Every day | ORAL | 11 refills | Status: DC

## 2019-03-26 NOTE — Progress Notes (Signed)
Patient: Morgan Dennis Female    DOB: 1934-04-19   85 y.o.   MRN: [id number] Visit Date: 03/26/2019  Today's Provider: [name], MD   Chief Complaint  Patient presents with  . Fatigue    achy joints, legs and feet swelling wearing compression stockings.  for about a month or two Jan or Feb  . dry mouth    possible from medications or breathing thru mouth at night.   Subjective:     HPI  Pt reports she has been very fatigue and has been going on for several months since Jan or Feb.  No energy and has achy joints.  Pt also reports some congestion clear.  Also she has been having dry mouth mainly at night. Dementia slowly getting worse.  There does not appear to be any depression.  Her urine symptoms are better on Ditropan but she does complain regularly to her husband of a dry mouth. She is not falling.  Her rheumatoid is arthritis is followed by Dr. Lyndon Code.  She seems to be sleeping well.  She is starting to sleep a lot. Husband complains that she has a dark and foul-smelling urine recently and is wondering about a UTI. Allergies  Allergen Reactions  . Penicillins Swelling and Rash    Other reaction(s): Rash Has patient had a PCN reaction causing immediate rash, facial/tongue/throat swelling, SOB or lightheadedness with hypotension: Yes Has patient had a PCN reaction causing severe rash involving mucus membranes or skin necrosis: Unknown Has patient had a PCN reaction that required hospitalization:Yes Has patient had a PCN reaction occurring within the last 10 years: No If all of the above answers are "NO", then may proceed with Cephalosporin use.   . Evista  [Raloxifene]     Other reaction(s): Joint Pains  . Ibandronic Acid     Other reaction(s): Muscle Pain  . Remicade [Infliximab] Other (See Comments)    Due to bleeding ulcer issue--MD advised her not to take  . Risedronate Sodium     Other reaction(s): Joint Pains     Current Outpatient  Medications:  .  abatacept (ORENCIA) 250 MG injection, Inject 125 mg into the vein every 30 (thirty) days. , Disp: , Rfl:  .  acetaminophen (TYLENOL) 325 MG tablet, Take 2 tablets (650 mg total) by mouth every 6 (six) hours as needed for mild pain (or Fever >/= 101)., Disp: , Rfl:  .  ascorbic acid (VITAMIN C) 1000 MG tablet, Take 1,000 mg by mouth daily., Disp: , Rfl:  .  CALCIUM PO, Take 2,000 mg by mouth daily., Disp: , Rfl:  .  carbidopa-levodopa (SINEMET IR) 25-100 MG tablet, Take 1 tablet by mouth 3 (three) times daily. , Disp: , Rfl:  .  Cholecalciferol (VITAMIN D3) 2000 UNITS TABS, Take 2,000 Units by mouth daily. , Disp: , Rfl:  .  Docusate Sodium (DSS) 100 MG CAPS, TAKE ONE (1) CAPSULE BY MOUTH 2 TIMES DAILY AS NEEDED FOR CONSTIPATION., Disp: , Rfl:  .  donepezil (ARICEPT) 10 MG tablet, Take 10 mg by mouth daily with supper. , Disp: , Rfl:  .  DULoxetine (CYMBALTA) 60 MG capsule, Take 60 mg by mouth daily., Disp: , Rfl:  .  folic acid (FOLVITE) 1 MG tablet, Take 1 mg by mouth daily., Disp: , Rfl:  .  furosemide (LASIX) 20 MG tablet, Take 20 mg by mouth. Pt given med by Dr Jefm Bryant, Disp: , Rfl:  .  gabapentin (  NEURONTIN) 300 MG capsule, Take 600 mg by mouth 3 (three) times daily. , Disp: , Rfl:  .  Melatonin 5 MG TABS, Take 5 mg by mouth at bedtime. , Disp: , Rfl:  .  memantine (NAMENDA) 5 MG tablet, Take 1 tablet (5 mg total) by mouth 2 (two) times daily. (Patient taking differently: Take 5 mg by mouth 2 (two) times daily. In the morning and with supper), Disp: 180 tablet, Rfl: 3 .  methotrexate 2.5 MG tablet, Take 15 mg by mouth every Wednesday. , Disp: , Rfl:  .  Multiple Vitamin (MULTIVITAMIN WITH MINERALS) TABS tablet, Take 1 tablet by mouth daily., Disp: , Rfl:  .  neomycin-polymyxin-dexamethasone (MAXITROL) 0.1 % ophthalmic suspension, Place 1 drop into both eyes daily., Disp: , Rfl:  .  Omega-3 Fatty Acids (FISH OIL) 1000 MG CAPS, Take 1 capsule (1,000 mg total) by mouth daily.  (Patient taking differently: Take 1,000 mg by mouth daily. ), Disp: , Rfl: 0 .  omeprazole (PRILOSEC) 20 MG capsule, TAKE ONE CAPSULE DAILY., Disp: 30 capsule, Rfl: 11 .  ondansetron (ZOFRAN) 4 MG tablet, Take 1 tablet (4 mg total) by mouth every 8 (eight) hours as needed for nausea or vomiting., Disp: 20 tablet, Rfl: 0 .  oxybutynin (DITROPAN-XL) 10 MG 24 hr tablet, Take 1 tablet (10 mg total) by mouth at bedtime., Disp: 90 tablet, Rfl: 3 .  vitamin E 400 UNIT capsule, Take 400 Units by mouth daily., Disp: , Rfl:  .  HYDROcodone-acetaminophen (NORCO) 5-325 MG tablet, Take 1 tablet by mouth every 6 (six) hours as needed for moderate pain. (Patient not taking: Reported on 05/22/2018), Disp: 15 tablet, Rfl: 0  Review of Systems  Constitutional: Positive for fatigue.  HENT: Positive for congestion (clear).   Eyes: Negative.   Respiratory: Negative.   Cardiovascular: Negative.   Gastrointestinal: Negative.   Endocrine: Negative.   Genitourinary: Negative.   Musculoskeletal: Positive for arthralgias (achy joints).  Skin: Negative.   Allergic/Immunologic: Negative.   Neurological: Negative.   Hematological: Negative.   Psychiatric/Behavioral: Negative.     Social History   Tobacco Use  . Smoking status: Never Smoker  . Smokeless tobacco: Never Used  Substance Use Topics  . Alcohol use: No      Objective:   BP 112/60 (BP Location: Right Arm, Patient Position: Sitting, Cuff Size: Normal)   Pulse 93   Temp 99 F (37.2 C) (Oral)   Ht 5\' 2"  (1.575 m)   Wt 167 lb 6.4 oz (75.9 kg)   SpO2 93%   BMI 30.62 kg/m  Vitals:   03/26/19 1342  BP: 112/60  Pulse: 93  Temp: 99 F (37.2 C)  TempSrc: Oral  SpO2: 93%  Weight: 167 lb 6.4 oz (75.9 kg)  Height: 5\' 2"  (1.575 m)     Physical Exam Vitals signs reviewed.  Constitutional:      Appearance: She is well-developed.  HENT:     Head: Normocephalic and atraumatic.  Eyes:     General: No scleral icterus.    Conjunctiva/sclera:  Conjunctivae normal.  Neck:     Thyroid: No thyromegaly.  Cardiovascular:     Rate and Rhythm: Normal rate and regular rhythm.     Heart sounds: Normal heart sounds.  Pulmonary:     Effort: Pulmonary effort is normal.     Breath sounds: Normal breath sounds.  Abdominal:     Palpations: Abdomen is soft.  Musculoskeletal:     Right lower leg: No edema.  Left lower leg: No edema.  Lymphadenopathy:     Cervical: No cervical adenopathy.  Skin:    General: Skin is warm and dry.     Comments: Multiple SKs.  Neurological:     Mental Status: She is alert and oriented to person, place, and time.  Psychiatric:        Mood and Affect: Mood normal.        Behavior: Behavior normal.         Assessment & Plan    1. Late onset Alzheimer's disease without behavioral disturbance (HCC) Progressive.  MMSE on next visit. More than 50% of 25-minute visit is spent in counseling and coordination of care. 2. Urinary tract infection without hematuria, site unspecified Check urine culture before treating. - Urine Culture  3. Other fatigue Multifactorial.  I think dementia has a big contributing factor.  Check labs today.  Return to clinic 2 months. - CBC with Differential/Platelet - Comprehensive metabolic panel - Sedimentation rate - CK  4. Myalgia  - CBC with Differential/Platelet - Comprehensive metabolic panel - Sedimentation rate - CK  5. Arthritis  - CBC with Differential/Platelet - Comprehensive metabolic panel - Sedimentation rate - CK  6. Overactive bladder Due to dry mouth will stop Ditropan and try Vesicare. - solifenacin (VESICARE) 5 MG tablet; Take 1 tablet (5 mg total) by mouth daily.  Dispense: 30 tablet; Refill: 11  7. Rheumatoid arthritis involving multiple sites, unspecified rheumatoid factor presence ([place]) Per Dr. Jefm Bryant.  8. Parkinson's disease ([place]) Treatment per neurology.    I have done the exam and reviewed the above chart and it is accurate to  the best of my knowledge. Development worker, community has been used in this note in any air is in the dictation or transcription are unintentional.  [name], MD  [place]

## 2019-03-27 LAB — COMPREHENSIVE METABOLIC PANEL
ALT: 17 IU/L (ref 0–32)
AST: 21 IU/L (ref 0–40)
Albumin/Globulin Ratio: 1.7 (ref 1.2–2.2)
Albumin: 4.1 g/dL (ref 3.6–4.6)
Alkaline Phosphatase: 74 IU/L (ref 39–117)
BUN/Creatinine Ratio: 37 — ABNORMAL HIGH (ref 12–28)
BUN: 20 mg/dL (ref 8–27)
Bilirubin Total: 0.2 mg/dL (ref 0.0–1.2)
CO2: 24 mmol/L (ref 20–29)
Calcium: 9.2 mg/dL (ref 8.7–10.3)
Chloride: 100 mmol/L (ref 96–106)
Creatinine, Ser: 0.54 mg/dL — ABNORMAL LOW (ref 0.57–1.00)
GFR calc Af Amer: 102 mL/min/{1.73_m2} (ref >59)
GFR calc non Af Amer: 88 mL/min/{1.73_m2} (ref >59)
Globulin, Total: 2.4 g/dL (ref 1.5–4.5)
Glucose: 93 mg/dL (ref 65–99)
Potassium: 4.2 mmol/L (ref 3.5–5.2)
Sodium: 141 mmol/L (ref 134–144)
Total Protein: 6.5 g/dL (ref 6.0–8.5)

## 2019-03-27 LAB — CBC WITH DIFFERENTIAL/PLATELET
Basophils Absolute: 0 10*3/uL (ref 0.0–0.2)
Basos: 1 %
EOS (ABSOLUTE): 0.3 10*3/uL (ref 0.0–0.4)
Eos: 4 %
Hematocrit: 40.8 % (ref 34.0–46.6)
Hemoglobin: 13.6 g/dL (ref 11.1–15.9)
Immature Grans (Abs): 0 10*3/uL (ref 0.0–0.1)
Immature Granulocytes: 0 %
Lymphocytes Absolute: 1.8 10*3/uL (ref 0.7–3.1)
Lymphs: 28 %
MCH: 32.7 pg (ref 26.6–33.0)
MCHC: 33.3 g/dL (ref 31.5–35.7)
MCV: 98 fL — ABNORMAL HIGH (ref 79–97)
Monocytes Absolute: 0.9 10*3/uL (ref 0.1–0.9)
Monocytes: 13 %
Neutrophils Absolute: 3.6 10*3/uL (ref 1.4–7.0)
Neutrophils: 54 %
Platelets: 173 10*3/uL (ref 150–450)
RBC: 4.16 x10E6/uL (ref 3.77–5.28)
RDW: 12.9 % (ref 11.7–15.4)
WBC: 6.6 10*3/uL (ref 3.4–10.8)

## 2019-03-27 LAB — SEDIMENTATION RATE: Sed Rate: 19 mm/hr (ref 0–40)

## 2019-03-27 LAB — CK: Total CK: 75 U/L (ref 24–173)

## 2019-03-28 ENCOUNTER — Telehealth: Payer: Self-pay

## 2019-03-28 LAB — URINE CULTURE

## 2019-03-28 NOTE — Telephone Encounter (Signed)
-----   Message from Jerrol Banana., MD sent at 03/28/2019  8:43 AM EDT ----- No UTI--labs OK.

## 2019-03-28 NOTE — Telephone Encounter (Signed)
Pt's husband advised. (On DPR)   Thanks,   -Mickel Baas

## 2019-04-15 DIAGNOSIS — M0579 Rheumatoid arthritis with rheumatoid factor of multiple sites without organ or systems involvement: Secondary | ICD-10-CM | POA: Diagnosis not present

## 2019-05-14 DIAGNOSIS — M810 Age-related osteoporosis without current pathological fracture: Secondary | ICD-10-CM | POA: Diagnosis not present

## 2019-05-14 DIAGNOSIS — M79605 Pain in left leg: Secondary | ICD-10-CM | POA: Diagnosis not present

## 2019-05-14 DIAGNOSIS — M79604 Pain in right leg: Secondary | ICD-10-CM | POA: Diagnosis not present

## 2019-05-14 DIAGNOSIS — R609 Edema, unspecified: Secondary | ICD-10-CM | POA: Diagnosis not present

## 2019-05-14 DIAGNOSIS — M0579 Rheumatoid arthritis with rheumatoid factor of multiple sites without organ or systems involvement: Secondary | ICD-10-CM | POA: Diagnosis not present

## 2019-05-14 DIAGNOSIS — G2581 Restless legs syndrome: Secondary | ICD-10-CM | POA: Diagnosis not present

## 2019-05-14 DIAGNOSIS — E611 Iron deficiency: Secondary | ICD-10-CM | POA: Diagnosis not present

## 2019-05-14 DIAGNOSIS — G20 Parkinson's disease: Secondary | ICD-10-CM | POA: Diagnosis not present

## 2019-05-16 DIAGNOSIS — G20 Parkinson's disease: Secondary | ICD-10-CM | POA: Diagnosis not present

## 2019-05-27 ENCOUNTER — Other Ambulatory Visit: Payer: Self-pay

## 2019-05-27 ENCOUNTER — Ambulatory Visit (INDEPENDENT_AMBULATORY_CARE_PROVIDER_SITE_OTHER): Payer: Medicare HMO

## 2019-05-27 ENCOUNTER — Encounter: Payer: Medicare HMO | Admitting: Family Medicine

## 2019-05-27 DIAGNOSIS — M810 Age-related osteoporosis without current pathological fracture: Secondary | ICD-10-CM | POA: Diagnosis not present

## 2019-05-27 DIAGNOSIS — Z0000 Encounter for general adult medical examination without abnormal findings: Secondary | ICD-10-CM
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
## 2019-05-27 NOTE — Patient Instructions (Addendum)
Ms. Morgan Dennis , Thank you for taking time to come for your Medicare Wellness Visit. I appreciate your ongoing commitment to your health goals. Please review the following plan we discussed and let me know if I can assist you in the future.   Screening recommendations/referrals: Colonoscopy: No longer required.  Mammogram: No longer required.  Bone Density: Up to date, due 12/2022 Recommended yearly ophthalmology/optometry visit for glaucoma screening and checkup Recommended yearly dental visit for hygiene and checkup  Vaccinations: Influenza vaccine: Up to date Pneumococcal vaccine: Completed series Tdap vaccine: Up to date, due 10/2021 Shingles vaccine: Completed series    Advanced directives: Please bring a copy of your POA (Power of Attorney) and/or Living Will to your next appointment.   Conditions/risks identified: Continue to increase water intake to 6-8 8 oz glasses a day.   Next appointment: 07/30/19 with Dr Rosanna Randy. Declined scheduling an AWV for 2021 at this time.   Preventive Care 83 Years and Older, Female Preventive care refers to lifestyle choices and visits with your health care provider that can promote health and wellness. What does preventive care include?  A yearly physical exam. This is also called an annual well check.  Dental exams once or twice a year.  Routine eye exams. Ask your health care provider how often you should have your eyes checked.  Personal lifestyle choices, including:  Daily care of your teeth and gums.  Regular physical activity.  Eating a healthy diet.  Avoiding tobacco and drug use.  Limiting alcohol use.  Practicing safe sex.  Taking low-dose aspirin every day.  Taking vitamin and mineral supplements as recommended by your health care provider. What happens during an annual well check? The services and screenings done by your health care provider during your annual well check will depend on your age, overall health, lifestyle  risk factors, and family history of disease. Counseling  Your health care provider may ask you questions about your:  Alcohol use.  Tobacco use.  Drug use.  Emotional well-being.  Home and relationship well-being.  Sexual activity.  Eating habits.  History of falls.  Memory and ability to understand (cognition).  Work and work Statistician.  Reproductive health. Screening  You may have the following tests or measurements:  Height, weight, and BMI.  Blood pressure.  Lipid and cholesterol levels. These may be checked every 5 years, or more frequently if you are over 32 years old.  Skin check.  Lung cancer screening. You may have this screening every year starting at age 51 if you have a 30-pack-year history of smoking and currently smoke or have quit within the past 15 years.  Fecal occult blood test (FOBT) of the stool. You may have this test every year starting at age 78.  Flexible sigmoidoscopy or colonoscopy. You may have a sigmoidoscopy every 5 years or a colonoscopy every 10 years starting at age 47.  Hepatitis C blood test.  Hepatitis B blood test.  Sexually transmitted disease (STD) testing.  Diabetes screening. This is done by checking your blood sugar (glucose) after you have not eaten for a while (fasting). You may have this done every 1-3 years.  Bone density scan. This is done to screen for osteoporosis. You may have this done starting at age 8.  Mammogram. This may be done every 1-2 years. Talk to your health care provider about how often you should have regular mammograms. Talk with your health care provider about your test results, treatment options, and if necessary, the  need for more tests. Vaccines  Your health care provider may recommend certain vaccines, such as:  Influenza vaccine. This is recommended every year.  Tetanus, diphtheria, and acellular pertussis (Tdap, Td) vaccine. You may need a Td booster every 10 years.  Zoster vaccine.  You may need this after age 9.  Pneumococcal 13-valent conjugate (PCV13) vaccine. One dose is recommended after age 21.  Pneumococcal polysaccharide (PPSV23) vaccine. One dose is recommended after age 67. Talk to your health care provider about which screenings and vaccines you need and how often you need them. This information is not intended to replace advice given to you by your health care provider. Make sure you discuss any questions you have with your health care provider. Document Released: 12/31/2015 Document Revised: 08/23/2016 Document Reviewed: 10/05/2015 Elsevier Interactive Patient Education  2017 [place] Prevention in the Home Falls can cause injuries. They can happen to people of all ages. There are many things you can do to make your home safe and to help prevent falls. What can I do on the outside of my home?  Regularly fix the edges of walkways and driveways and fix any cracks.  Remove anything that might make you trip as you walk through a door, such as a raised step or threshold.  Trim any bushes or trees on the path to your home.  Use bright outdoor lighting.  Clear any walking paths of anything that might make someone trip, such as rocks or tools.  Regularly check to see if handrails are loose or broken. Make sure that both sides of any steps have handrails.  Any raised decks and porches should have guardrails on the edges.  Have any leaves, snow, or ice cleared regularly.  Use sand or salt on walking paths during winter.  Clean up any spills in your garage right away. This includes oil or grease spills. What can I do in the bathroom?  Use night lights.  Install grab bars by the toilet and in the tub and shower. Do not use towel bars as grab bars.  Use non-skid mats or decals in the tub or shower.  If you need to sit down in the shower, use a plastic, non-slip stool.  Keep the floor dry. Clean up any water that spills on the floor as soon  as it happens.  Remove soap buildup in the tub or shower regularly.  Attach bath mats securely with double-sided non-slip rug tape.  Do not have throw rugs and other things on the floor that can make you trip. What can I do in the bedroom?  Use night lights.  Make sure that you have a light by your bed that is easy to reach.  Do not use any sheets or blankets that are too big for your bed. They should not hang down onto the floor.  Have a firm chair that has side arms. You can use this for support while you get dressed.  Do not have throw rugs and other things on the floor that can make you trip. What can I do in the kitchen?  Clean up any spills right away.  Avoid walking on wet floors.  Keep items that you use a lot in easy-to-reach places.  If you need to reach something above you, use a strong step stool that has a grab bar.  Keep electrical cords out of the way.  Do not use floor polish or wax that makes floors slippery. If you must use wax,  use non-skid floor wax.  Do not have throw rugs and other things on the floor that can make you trip. What can I do with my stairs?  Do not leave any items on the stairs.  Make sure that there are handrails on both sides of the stairs and use them. Fix handrails that are broken or loose. Make sure that handrails are as long as the stairways.  Check any carpeting to make sure that it is firmly attached to the stairs. Fix any carpet that is loose or worn.  Avoid having throw rugs at the top or bottom of the stairs. If you do have throw rugs, attach them to the floor with carpet tape.  Make sure that you have a light switch at the top of the stairs and the bottom of the stairs. If you do not have them, ask someone to add them for you. What else can I do to help prevent falls?  Wear shoes that:  Do not have high heels.  Have rubber bottoms.  Are comfortable and fit you well.  Are closed at the toe. Do not wear sandals.  If  you use a stepladder:  Make sure that it is fully opened. Do not climb a closed stepladder.  Make sure that both sides of the stepladder are locked into place.  Ask someone to hold it for you, if possible.  Clearly mark and make sure that you can see:  Any grab bars or handrails.  First and last steps.  Where the edge of each step is.  Use tools that help you move around (mobility aids) if they are needed. These include:  Canes.  Walkers.  Scooters.  Crutches.  Turn on the lights when you go into a dark area. Replace any light bulbs as soon as they burn out.  Set up your furniture so you have a clear path. Avoid moving your furniture around.  If any of your floors are uneven, fix them.  If there are any pets around you, be aware of where they are.  Review your medicines with your doctor. Some medicines can make you feel dizzy. This can increase your chance of falling. Ask your doctor what other things that you can do to help prevent falls. This information is not intended to replace advice given to you by your health care provider. Make sure you discuss any questions you have with your health care provider. Document Released: 09/30/2009 Document Revised: 05/11/2016 Document Reviewed: 01/08/2015 Elsevier Interactive Patient Education  2017 [employer].

## 2019-06-16 ENCOUNTER — Emergency Department: Payer: Medicare HMO

## 2019-06-16 ENCOUNTER — Inpatient Hospital Stay
Admission: EM | Admit: 2019-06-16 | Discharge: 2019-06-19 | DRG: 871 | Disposition: A | Discharge status: Home Health | Payer: Medicare HMO | Attending: Internal Medicine | Admitting: Internal Medicine

## 2019-06-16 ENCOUNTER — Other Ambulatory Visit: Payer: Self-pay

## 2019-06-16 ENCOUNTER — Other Ambulatory Visit: Payer: Self-pay | Admitting: Radiology

## 2019-06-16 ENCOUNTER — Encounter: Payer: Self-pay | Admitting: Emergency Medicine

## 2019-06-16 DIAGNOSIS — Z20828 Contact with and (suspected) exposure to other viral communicable diseases: Secondary | ICD-10-CM | POA: Diagnosis present

## 2019-06-16 DIAGNOSIS — R509 Fever, unspecified: Secondary | ICD-10-CM | POA: Diagnosis not present

## 2019-06-16 DIAGNOSIS — Z79899 Other long term (current) drug therapy: Secondary | ICD-10-CM

## 2019-06-16 DIAGNOSIS — K5792 Diverticulitis of intestine, part unspecified, without perforation or abscess without bleeding: Secondary | ICD-10-CM | POA: Diagnosis present

## 2019-06-16 DIAGNOSIS — Z8249 Family history of ischemic heart disease and other diseases of the circulatory system: Secondary | ICD-10-CM | POA: Diagnosis not present

## 2019-06-16 DIAGNOSIS — G301 Alzheimer's disease with late onset: Secondary | ICD-10-CM | POA: Diagnosis not present

## 2019-06-16 DIAGNOSIS — G2581 Restless legs syndrome: Secondary | ICD-10-CM | POA: Diagnosis present

## 2019-06-16 DIAGNOSIS — R0902 Hypoxemia: Secondary | ICD-10-CM | POA: Diagnosis present

## 2019-06-16 DIAGNOSIS — R69 Illness, unspecified: Secondary | ICD-10-CM | POA: Diagnosis not present

## 2019-06-16 DIAGNOSIS — A419 Sepsis, unspecified organism: Principal | ICD-10-CM | POA: Diagnosis present

## 2019-06-16 DIAGNOSIS — E871 Hypo-osmolality and hyponatremia: Secondary | ICD-10-CM | POA: Diagnosis present

## 2019-06-16 DIAGNOSIS — F0280 Dementia in other diseases classified elsewhere without behavioral disturbance: Secondary | ICD-10-CM | POA: Diagnosis present

## 2019-06-16 DIAGNOSIS — Z96659 Presence of unspecified artificial knee joint: Secondary | ICD-10-CM | POA: Diagnosis present

## 2019-06-16 DIAGNOSIS — Z888 Allergy status to other drugs, medicaments and biological substances status: Secondary | ICD-10-CM | POA: Diagnosis not present

## 2019-06-16 DIAGNOSIS — I700 Atherosclerosis of aorta: Secondary | ICD-10-CM | POA: Diagnosis not present

## 2019-06-16 DIAGNOSIS — R1032 Left lower quadrant pain: Secondary | ICD-10-CM | POA: Diagnosis not present

## 2019-06-16 DIAGNOSIS — Z79891 Long term (current) use of opiate analgesic: Secondary | ICD-10-CM

## 2019-06-16 DIAGNOSIS — K5900 Constipation, unspecified: Secondary | ICD-10-CM | POA: Diagnosis not present

## 2019-06-16 DIAGNOSIS — M1990 Unspecified osteoarthritis, unspecified site: Secondary | ICD-10-CM | POA: Diagnosis present

## 2019-06-16 DIAGNOSIS — R6520 Severe sepsis without septic shock: Secondary | ICD-10-CM | POA: Diagnosis present

## 2019-06-16 DIAGNOSIS — G20 Parkinson's disease: Secondary | ICD-10-CM | POA: Diagnosis present

## 2019-06-16 DIAGNOSIS — K219 Gastro-esophageal reflux disease without esophagitis: Secondary | ICD-10-CM | POA: Diagnosis present

## 2019-06-16 DIAGNOSIS — G9341 Metabolic encephalopathy: Secondary | ICD-10-CM | POA: Diagnosis not present

## 2019-06-16 DIAGNOSIS — E785 Hyperlipidemia, unspecified: Secondary | ICD-10-CM | POA: Diagnosis present

## 2019-06-16 DIAGNOSIS — K5730 Diverticulosis of large intestine without perforation or abscess without bleeding: Secondary | ICD-10-CM | POA: Diagnosis not present

## 2019-06-16 DIAGNOSIS — Z880 Allergy status to penicillin: Secondary | ICD-10-CM

## 2019-06-16 DIAGNOSIS — R4182 Altered mental status, unspecified: Secondary | ICD-10-CM | POA: Diagnosis not present

## 2019-06-16 DIAGNOSIS — K5732 Diverticulitis of large intestine without perforation or abscess without bleeding: Secondary | ICD-10-CM | POA: Diagnosis not present

## 2019-06-16 LAB — COMPREHENSIVE METABOLIC PANEL
ALT: 22 U/L (ref 0–44)
AST: 22 U/L (ref 15–41)
Albumin: 3.7 g/dL (ref 3.5–5.0)
Alkaline Phosphatase: 73 U/L (ref 38–126)
Anion gap: 8 (ref 5–15)
BUN: 22 mg/dL (ref 8–23)
CO2: 24 mmol/L (ref 22–32)
Calcium: 7.9 mg/dL — ABNORMAL LOW (ref 8.9–10.3)
Chloride: 102 mmol/L (ref 98–111)
Creatinine, Ser: 0.59 mg/dL (ref 0.44–1.00)
GFR calc Af Amer: >60 mL/min (ref >60)
GFR calc non Af Amer: >60 mL/min (ref >60)
Glucose, Bld: 128 mg/dL — ABNORMAL HIGH (ref 70–99)
Potassium: 3.7 mmol/L (ref 3.5–5.1)
Sodium: 134 mmol/L — ABNORMAL LOW (ref 135–145)
Total Bilirubin: 1.1 mg/dL (ref 0.3–1.2)
Total Protein: 7 g/dL (ref 6.5–8.1)

## 2019-06-16 LAB — URINALYSIS, COMPLETE (UACMP) WITH MICROSCOPIC
Bacteria, UA: NONE SEEN
Bilirubin Urine: NEGATIVE
Glucose, UA: NEGATIVE mg/dL
Hgb urine dipstick: NEGATIVE
Ketones, ur: 20 mg/dL — AB
Leukocytes,Ua: NEGATIVE
Nitrite: NEGATIVE
Protein, ur: 100 mg/dL — AB
Specific Gravity, Urine: 1.032 — ABNORMAL HIGH (ref 1.005–1.030)
Squamous Epithelial / HPF: NONE SEEN (ref 0–5)
pH: 5 (ref 5.0–8.0)

## 2019-06-16 LAB — CBC
HCT: 40.8 % (ref 36.0–46.0)
Hemoglobin: 13.4 g/dL (ref 12.0–15.0)
MCH: 32.4 pg (ref 26.0–34.0)
MCHC: 32.8 g/dL (ref 30.0–36.0)
MCV: 98.6 fL (ref 80.0–100.0)
Platelets: 160 10*3/uL (ref 150–400)
RBC: 4.14 MIL/uL (ref 3.87–5.11)
RDW: 13.4 % (ref 11.5–15.5)
WBC: 21.2 10*3/uL — ABNORMAL HIGH (ref 4.0–10.5)
nRBC: 0 % (ref 0.0–0.2)

## 2019-06-16 LAB — LACTIC ACID, PLASMA
Lactic Acid, Venous: 1.1 mmol/L (ref 0.5–1.9)
Lactic Acid, Venous: 1.1 mmol/L (ref 0.5–1.9)

## 2019-06-16 LAB — TROPONIN I (HIGH SENSITIVITY)
Troponin I (High Sensitivity): 20 ng/L — ABNORMAL HIGH (ref <18)
Troponin I (High Sensitivity): 22 ng/L — ABNORMAL HIGH (ref <18)

## 2019-06-16 LAB — SARS CORONAVIRUS 2 BY RT PCR (HOSPITAL ORDER, PERFORMED IN ~~LOC~~ HOSPITAL LAB): SARS Coronavirus 2: NEGATIVE

## 2019-06-16 LAB — PROTIME-INR
INR: 1.1 (ref 0.8–1.2)
Prothrombin Time: 14.4 seconds (ref 11.4–15.2)

## 2019-06-16 LAB — PROCALCITONIN: Procalcitonin: 0.2 ng/mL

## 2019-06-16 MED ORDER — CIPROFLOXACIN IN D5W 400 MG/200ML IV SOLN
400.0000 mg | Freq: Two times a day (BID) | INTRAVENOUS | Status: DC
  Administered 2019-06-17 – 2019-06-19 (×4): 400 mg via INTRAVENOUS
  Filled 2019-06-16 (×5): qty 200

## 2019-06-16 MED ORDER — ONDANSETRON HCL 4 MG/2ML IJ SOLN: 4.0000 mg | Freq: Four times a day (QID) | INTRAMUSCULAR | Status: DC | PRN

## 2019-06-16 MED ORDER — ALBUTEROL SULFATE (2.5 MG/3ML) 0.083% IN NEBU: 2.5000 mg | INHALATION_SOLUTION | RESPIRATORY_TRACT | Status: DC | PRN

## 2019-06-16 MED ORDER — SODIUM CHLORIDE 0.9 % IV SOLN
INTRAVENOUS | Status: DC | PRN
  Administered 2019-06-16 – 2019-06-19 (×4): 250 mL via INTRAVENOUS

## 2019-06-16 MED ORDER — HYDROCODONE-ACETAMINOPHEN 5-325 MG PO TABS: 1.0000 | ORAL_TABLET | ORAL | Status: DC | PRN

## 2019-06-16 MED ORDER — ACETAMINOPHEN 325 MG PO TABS
650.0000 mg | ORAL_TABLET | Freq: Once | ORAL | Status: AC | PRN
  Administered 2019-06-16: 650 mg via ORAL
  Filled 2019-06-16: qty 2

## 2019-06-16 MED ORDER — DULOXETINE HCL 30 MG PO CPEP
60.0000 mg | ORAL_CAPSULE | Freq: Every day | ORAL | Status: DC
  Administered 2019-06-16 – 2019-06-19 (×4): 60 mg via ORAL
  Filled 2019-06-16 (×4): qty 2

## 2019-06-16 MED ORDER — ACETAMINOPHEN 325 MG PO TABS: 650.0000 mg | ORAL_TABLET | Freq: Four times a day (QID) | ORAL | Status: DC | PRN

## 2019-06-16 MED ORDER — METRONIDAZOLE IN NACL 5-0.79 MG/ML-% IV SOLN
500.0000 mg | Freq: Three times a day (TID) | INTRAVENOUS | Status: DC
  Administered 2019-06-16 – 2019-06-19 (×8): 500 mg via INTRAVENOUS
  Filled 2019-06-16 (×10): qty 100

## 2019-06-16 MED ORDER — FOLIC ACID 1 MG PO TABS
1.0000 mg | ORAL_TABLET | Freq: Every day | ORAL | Status: DC
  Administered 2019-06-16 – 2019-06-19 (×4): 1 mg via ORAL
  Filled 2019-06-16 (×4): qty 1

## 2019-06-16 MED ORDER — ROPINIROLE HCL 0.25 MG PO TABS
0.2500 mg | ORAL_TABLET | Freq: Four times a day (QID) | ORAL | Status: DC
  Administered 2019-06-16 – 2019-06-19 (×10): 0.25 mg via ORAL
  Filled 2019-06-16 (×13): qty 1

## 2019-06-16 MED ORDER — CARBIDOPA-LEVODOPA 25-100 MG PO TABS
1.0000 | ORAL_TABLET | Freq: Four times a day (QID) | ORAL | Status: DC
  Administered 2019-06-16 – 2019-06-19 (×10): 1 via ORAL
  Filled 2019-06-16 (×13): qty 1

## 2019-06-16 MED ORDER — ACETAMINOPHEN 325 MG PO TABS
ORAL_TABLET | ORAL | Status: AC
  Administered 2019-06-16: 21:00:00 650 mg via ORAL
  Filled 2019-06-16: qty 2

## 2019-06-16 MED ORDER — ENOXAPARIN SODIUM 40 MG/0.4ML ~~LOC~~ SOLN
40.0000 mg | SUBCUTANEOUS | Status: DC
  Administered 2019-06-16 – 2019-06-18 (×3): 40 mg via SUBCUTANEOUS
  Filled 2019-06-16 (×3): qty 0.4

## 2019-06-16 MED ORDER — SENNOSIDES-DOCUSATE SODIUM 8.6-50 MG PO TABS: 1.0000 | ORAL_TABLET | Freq: Every evening | ORAL | Status: DC | PRN

## 2019-06-16 MED ORDER — ACETAMINOPHEN 650 MG RE SUPP: 650.0000 mg | Freq: Four times a day (QID) | RECTAL | Status: DC | PRN

## 2019-06-16 MED ORDER — PREGABALIN 75 MG PO CAPS
150.0000 mg | ORAL_CAPSULE | Freq: Two times a day (BID) | ORAL | Status: DC
  Administered 2019-06-16 – 2019-06-19 (×6): 150 mg via ORAL
  Filled 2019-06-16 (×6): qty 2

## 2019-06-16 MED ORDER — ONDANSETRON HCL 4 MG PO TABS: 4.0000 mg | ORAL_TABLET | Freq: Four times a day (QID) | ORAL | Status: DC | PRN

## 2019-06-16 MED ORDER — GABAPENTIN 300 MG PO CAPS
600.0000 mg | ORAL_CAPSULE | Freq: Three times a day (TID) | ORAL | Status: DC
  Administered 2019-06-16 – 2019-06-19 (×8): 600 mg via ORAL
  Filled 2019-06-16 (×9): qty 2

## 2019-06-16 MED ORDER — MEMANTINE HCL 5 MG PO TABS
5.0000 mg | ORAL_TABLET | Freq: Every day | ORAL | Status: DC
  Administered 2019-06-16 – 2019-06-19 (×4): 5 mg via ORAL
  Filled 2019-06-16 (×4): qty 1

## 2019-06-16 MED ORDER — DONEPEZIL HCL 5 MG PO TABS
10.0000 mg | ORAL_TABLET | Freq: Every day | ORAL | Status: DC
  Administered 2019-06-16 – 2019-06-18 (×3): 10 mg via ORAL
  Filled 2019-06-16 (×4): qty 2

## 2019-06-16 MED ORDER — BISACODYL 5 MG PO TBEC: 5.0000 mg | DELAYED_RELEASE_TABLET | Freq: Every day | ORAL | Status: DC | PRN

## 2019-06-16 MED ORDER — MELATONIN 5 MG PO TABS
5.0000 mg | ORAL_TABLET | Freq: Every day | ORAL | Status: DC
  Administered 2019-06-16 – 2019-06-18 (×3): 5 mg via ORAL
  Filled 2019-06-16 (×4): qty 1

## 2019-06-16 MED ORDER — ACETAMINOPHEN 325 MG PO TABS
650.0000 mg | ORAL_TABLET | Freq: Once | ORAL | Status: AC
  Administered 2019-06-16: 21:00:00 650 mg via ORAL

## 2019-06-16 MED ORDER — LEVOFLOXACIN IN D5W 750 MG/150ML IV SOLN
750.0000 mg | Freq: Once | INTRAVENOUS | Status: AC
  Administered 2019-06-16: 750 mg via INTRAVENOUS
  Filled 2019-06-16: qty 150

## 2019-06-16 MED ORDER — POLYETHYLENE GLYCOL 3350 17 G PO PACK
17.0000 g | PACK | Freq: Every day | ORAL | Status: DC
  Administered 2019-06-16 – 2019-06-17 (×2): 17 g via ORAL
  Filled 2019-06-16 (×2): qty 1

## 2019-06-16 MED ORDER — PANTOPRAZOLE SODIUM 40 MG PO TBEC
40.0000 mg | DELAYED_RELEASE_TABLET | Freq: Every day | ORAL | Status: DC
  Administered 2019-06-16 – 2019-06-19 (×4): 40 mg via ORAL
  Filled 2019-06-16 (×5): qty 1

## 2019-06-16 MED ORDER — IOHEXOL 300 MG/ML  SOLN
100.0000 mL | Freq: Once | INTRAMUSCULAR | Status: AC | PRN
  Administered 2019-06-16: 16:00:00 100 mL via INTRAVENOUS
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
## 2019-06-16 NOTE — Consult Note (Signed)
PHARMACY -  BRIEF ANTIBIOTIC NOTE   Pharmacy has received consult(s) for Levaquin from an ED provider.  The patient's profile has been reviewed for ht/wt/allergies/indication/available labs.    One time order(s) placed for Levaquin 750mg  IV.  Further antibiotics/pharmacy consults should be ordered by admitting physician if indicated.                       Thank you, Pearla Dubonnet 06/16/2019  3:19 PM

## 2019-06-16 NOTE — Progress Notes (Signed)
Advanced Care Plan.  Purpose of Encounter: CODE STATUS. Parties in Attendance: The patient, her husband and me. Patient's Decisional Capacity: not sure. Medical Story: Morgan Dennis  is a 85 y.o. female with a known history of arthritis, GERD, hyperlipidemia, Parkinson's disease and restless syndrome.  The patient is being admitted for sepsis due to acute diverticulitis and acute metabolic encephalopathy.  I discussed with the patient and her husband about her current condition, prognosis and CODE STATUS.  The patient said that she wants to be resuscitated and intubated if she has cardiopulmonary arrest.  Her husband confirmed this. Plan:  Code Status: Full code. Time spent discussing advance care planning: 18 minutes.
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
## 2019-06-16 NOTE — ED Notes (Signed)
Pt placed on bedpan per request.

## 2019-06-16 NOTE — ED Triage Notes (Signed)
Pt sent by [facility] with husband due to fever and increased ams; has hx of dementia. Per husband report pt has had increased weakness for the last 2 days but today "was way worse." PT appears tired/weak in triage. Family/pt deny other acute symptoms.

## 2019-06-16 NOTE — Progress Notes (Signed)
CODE SEPSIS - PHARMACY COMMUNICATION  **Broad Spectrum Antibiotics should be administered within 1 hour of Sepsis diagnosis**  Time Code Sepsis Called/Page Received: 1512  Antibiotics Ordered: Levofloxacin(due to true PCN allergy)  Time of 1st antibiotic administration: 1532  Additional action taken by pharmacy: none  If necessary, Name of Provider/Nurse Contacted: N/A    Pearla Dubonnet ,PharmD Clinical Pharmacist  06/16/2019  3:34 PM
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
## 2019-06-17 ENCOUNTER — Other Ambulatory Visit: Payer: Self-pay

## 2019-06-17 LAB — CBC
HCT: 37 % (ref 36.0–46.0)
Hemoglobin: 12.1 g/dL (ref 12.0–15.0)
MCH: 32.3 pg (ref 26.0–34.0)
MCHC: 32.7 g/dL (ref 30.0–36.0)
MCV: 98.7 fL (ref 80.0–100.0)
Platelets: 132 10*3/uL — ABNORMAL LOW (ref 150–400)
RBC: 3.75 MIL/uL — ABNORMAL LOW (ref 3.87–5.11)
RDW: 13.4 % (ref 11.5–15.5)
WBC: 18.5 10*3/uL — ABNORMAL HIGH (ref 4.0–10.5)
nRBC: 0 % (ref 0.0–0.2)

## 2019-06-17 LAB — BASIC METABOLIC PANEL
Anion gap: 9 (ref 5–15)
BUN: 19 mg/dL (ref 8–23)
CO2: 27 mmol/L (ref 22–32)
Calcium: 8 mg/dL — ABNORMAL LOW (ref 8.9–10.3)
Chloride: 99 mmol/L (ref 98–111)
Creatinine, Ser: 0.58 mg/dL (ref 0.44–1.00)
GFR calc Af Amer: >60 mL/min (ref >60)
GFR calc non Af Amer: >60 mL/min (ref >60)
Glucose, Bld: 124 mg/dL — ABNORMAL HIGH (ref 70–99)
Potassium: 3.7 mmol/L (ref 3.5–5.1)
Sodium: 135 mmol/L (ref 135–145)

## 2019-06-17 MED ORDER — POLYETHYLENE GLYCOL 3350 17 G PO PACK
17.0000 g | PACK | Freq: Every day | ORAL | Status: DC
  Administered 2019-06-18 – 2019-06-19 (×2): 17 g via ORAL
  Filled 2019-06-17 (×2): qty 1

## 2019-06-17 MED ORDER — DOCUSATE SODIUM 100 MG PO CAPS
200.0000 mg | ORAL_CAPSULE | Freq: Two times a day (BID) | ORAL | Status: DC
  Administered 2019-06-17 – 2019-06-19 (×5): 200 mg via ORAL
  Filled 2019-06-17 (×5): qty 2

## 2019-06-17 NOTE — Plan of Care (Signed)
  Problem: Education: Goal: Knowledge of General Education information will improve Description Including pain rating scale, medication(s)/side effects and non-pharmacologic comfort measures Outcome: Progressing   

## 2019-06-17 NOTE — Progress Notes (Addendum)
Patient's husband at bedside. Patient only alert to self, hx of dementia. Spoke to son Amaryllis Dyke. over the phone and updated. Patient has been up to the [facility] with one assist and now sitting up in the chair by physical therapy.

## 2019-06-17 NOTE — Progress Notes (Signed)
[place] at [facility]                                                                                                                                                                                  Patient Demographics   Morgan Dennis, is a 85 y.o. female, DOB - 05/15/1934, QIW:[id number]  Admit date - 06/16/2019   Admitting Physician Demetrios Loll, MD  Outpatient Primary MD for the patient is Jerrol Banana., MD   LOS - 1  Subjective: Patient has dementia husband at by the bedside he states that she is somewhat confused    Review of Systems:   CONSTITUTIONAL: Unable to provide due to her dementia and memory issues  Vitals:   Vitals:   06/17/19 0515 06/17/19 0738 06/17/19 0739 06/17/19 0950  BP: (!) 139/55 (!) 147/56    Pulse: 89 90 89   Resp: 18 18    Temp: 98.7 F (37.1 C) 98.5 F (36.9 C)    TempSrc: Oral Oral    SpO2: 92% 90% 91% 91%  Weight:      Height:        Wt Readings from Last 3 Encounters:  06/16/19 77.1 kg  03/26/19 75.9 kg  10/11/18 70.8 kg     Intake/Output Summary (Last 24 hours) at 06/17/2019 1443 Last data filed at 06/17/2019 1157 Gross per 24 hour  Intake 373.36 ml  Output 75 ml  Net 298.36 ml    Physical Exam:   GENERAL: Pleasant-appearing in no apparent distress.  HEAD, EYES, EARS, NOSE AND THROAT: Atraumatic, normocephalic. Extraocular muscles are intact. Pupils equal and reactive to light. Sclerae anicteric. No conjunctival injection. No oro-pharyngeal erythema.  NECK: Supple. There is no jugular venous distention. No bruits, no lymphadenopathy, no thyromegaly.  HEART: Regular rate and rhythm,. No murmurs, no rubs, no clicks.  LUNGS: Clear to auscultation bilaterally. No rales or rhonchi. No wheezes.  ABDOMEN: Soft, flat, nontender, nondistended. Has good bowel sounds. No hepatosplenomegaly appreciated.  EXTREMITIES: No evidence of any cyanosis, clubbing, or peripheral edema.  +2 pedal and radial pulses  bilaterally.  NEUROLOGIC: The patient is alert, not oriented SKIN: Moist and warm with no rashes appreciated.  Psych: Not anxious, depressed LN: No inguinal LN enlargement    Antibiotics   Anti-infectives (From admission, onward)   Start     Dose/Rate Route Frequency Ordered Stop   06/17/19 1600  ciprofloxacin (CIPRO) IVPB 400 mg     400 mg 200 mL/hr over 60 Minutes Intravenous Every 12 hours 06/16/19 2157     06/16/19 2200  metroNIDAZOLE (FLAGYL) IVPB 500 mg     500 mg 100 mL/hr over 60  Minutes Intravenous Every 8 hours 06/16/19 2157     06/16/19 1515  levofloxacin (LEVAQUIN) IVPB 750 mg     750 mg 100 mL/hr over 90 Minutes Intravenous  Once 06/16/19 1511 06/16/19 1726      Medications   Scheduled Meds: . carbidopa-levodopa  1 tablet Oral QID  . donepezil  10 mg Oral Q supper  . DULoxetine  60 mg Oral Daily  . enoxaparin (LOVENOX) injection  40 mg Subcutaneous Q24H  . folic acid  1 mg Oral Daily  . gabapentin  600 mg Oral TID  . Melatonin  5 mg Oral QHS  . memantine  5 mg Oral Daily  . pantoprazole  40 mg Oral Daily  . polyethylene glycol  17 g Oral Daily  . pregabalin  150 mg Oral BID  . rOPINIRole  0.25 mg Oral QID   Continuous Infusions: . sodium chloride Stopped (06/17/19 0125)  . ciprofloxacin    . metronidazole 500 mg (06/17/19 0642)   PRN Meds:.sodium chloride, acetaminophen **OR** acetaminophen, albuterol, bisacodyl, HYDROcodone-acetaminophen, ondansetron **OR** ondansetron (ZOFRAN) IV, senna-docusate   Data Review:   Micro Results Recent Results (from the past 240 hour(s))  Culture, blood (Routine x 2)     Status: None (Preliminary result)   Collection Time: 06/16/19  2:30 PM   Specimen: BLOOD  Result Value Ref Range Status   Specimen Description BLOOD BLOOD RIGHT FOREARM  Final   Special Requests   Final    BOTTLES DRAWN AEROBIC AND ANAEROBIC Blood Culture results may not be optimal due to an excessive volume of blood received in culture bottles    Culture   Final    NO GROWTH < 24 HOURS Performed at [facility], [address], [place], [place] [address]    Report Status PENDING  Incomplete  Culture, blood (Routine x 2)     Status: None (Preliminary result)   Collection Time: 06/16/19  2:31 PM   Specimen: BLOOD  Result Value Ref Range Status   Specimen Description BLOOD BLOOD LEFT FOREARM  Final   Special Requests   Final    BOTTLES DRAWN AEROBIC AND ANAEROBIC Blood Culture results may not be optimal due to an excessive volume of blood received in culture bottles   Culture   Final    NO GROWTH < 24 HOURS Performed at [facility], [address] [place], [place] [address]    Report Status PENDING  Incomplete  SARS Coronavirus 2 (CEPHEID- Performed in [place] hospital lab), Hosp Order     Status: None   Collection Time: 06/16/19  2:42 PM   Specimen: Nasopharyngeal Swab  Result Value Ref Range Status   SARS Coronavirus 2 NEGATIVE NEGATIVE Final    Comment: (NOTE) If result is NEGATIVE SARS-CoV-2 target nucleic acids are NOT DETECTED. The SARS-CoV-2 RNA is generally detectable in upper and lower  respiratory specimens during the acute phase of infection. The lowest  concentration of SARS-CoV-2 viral copies this assay can detect is 250  copies / mL. A negative result does not preclude SARS-CoV-2 infection  and should not be used as the sole basis for treatment or other  patient management decisions.  A negative result may occur with  improper specimen collection / handling, submission of specimen other  than nasopharyngeal swab, presence of viral mutation(s) within the  areas targeted by this assay, and inadequate number of viral copies  (<250 copies / mL). A negative result must be combined with clinical  observations, patient  history, and epidemiological information. If result is POSITIVE SARS-CoV-2 target nucleic acids are DETECTED. The SARS-CoV-2 RNA is generally detectable in upper and lower   respiratory specimens dur ing the acute phase of infection.  Positive  results are indicative of active infection with SARS-CoV-2.  Clinical  correlation with patient history and other diagnostic information is  necessary to determine patient infection status.  Positive results do  not rule out bacterial infection or co-infection with other viruses. If result is PRESUMPTIVE POSTIVE SARS-CoV-2 nucleic acids MAY BE PRESENT.   A presumptive positive result was obtained on the submitted specimen  and confirmed on repeat testing.  While 2019 novel coronavirus  (SARS-CoV-2) nucleic acids may be present in the submitted sample  additional confirmatory testing may be necessary for epidemiological  and / or clinical management purposes  to differentiate between  SARS-CoV-2 and other Sarbecovirus currently known to infect humans.  If clinically indicated additional testing with an alternate test  methodology [phone]) is advised. The SARS-CoV-2 RNA is generally  detectable in upper and lower respiratory sp ecimens during the acute  phase of infection. The expected result is Negative. Fact Sheet for Patients:  StrictlyIdeas.no Fact Sheet for Healthcare Providers: BankingDealers.co.za This test is not yet approved or cleared by the [place] FDA and has been authorized for detection and/or diagnosis of SARS-CoV-2 by FDA under an Emergency Use Authorization (EUA).  This EUA will remain in effect (meaning this test can be used) for the duration of the COVID-19 declaration under Section 564(b)(1) of the Act, 21 U.S.C. section 360bbb-3(b)(1), unless the authorization is terminated or revoked sooner. Performed at [facility], [address], [place], [place] [address]     Radiology Reports Ct Chest W Contrast  Result Date: 06/16/2019 CLINICAL DATA:  Fever, dementia, AMS, abdominal pain EXAM: CT CHEST, ABDOMEN, AND PELVIS WITH  CONTRAST TECHNIQUE: Multidetector CT imaging of the chest, abdomen and pelvis was performed following the standard protocol during bolus administration of intravenous contrast. CONTRAST:  146mL OMNIPAQUE IOHEXOL 300 MG/ML  SOLN COMPARISON:  CT abdomen pelvis, 08/18/2015 FINDINGS: CT CHEST FINDINGS Cardiovascular: Aortic atherosclerosis. Normal heart size. Coronary artery calcifications. No pericardial effusion. Mediastinum/Nodes: No enlarged mediastinal, hilar, or axillary lymph nodes. Multiple small thyroid nodules. Trachea, and esophagus demonstrate no significant findings. Lungs/Pleura: Bandlike scarring in the bilateral lung bases. No pleural effusion or pneumothorax. Musculoskeletal: No chest wall mass or suspicious bone lesions identified. CT ABDOMEN PELVIS FINDINGS Hepatobiliary: No solid liver abnormality is seen. No gallstones, gallbladder wall thickening, or biliary dilatation. Pancreas: Unremarkable. No pancreatic ductal dilatation or surrounding inflammatory changes. Spleen: Normal in size without significant abnormality. Adrenals/Urinary Tract: Adrenal glands are unremarkable. Kidneys are normal, without renal calculi, solid lesion, or hydronephrosis. Bladder is unremarkable. Stomach/Bowel: Stomach is within normal limits. Large burden of stool and stool balls in the colon and rectum, with diverticulosis and inflammatory fat stranding about the proximal to mid sigmoid (series 2, image 94). Vascular/Lymphatic: Aortic atherosclerosis. No enlarged abdominal or pelvic lymph nodes. Reproductive: No mass or other abnormality. Other: No abdominal wall hernia or abnormality. No abdominopelvic ascites. Musculoskeletal: No acute or significant osseous findings. Status post L1 kyphoplasty. IMPRESSION: 1. Large burden of stool and stool balls in the colon and rectum, with diverticulosis and inflammatory fat stranding about the proximal to mid sigmoid (series 2, image 94). Findings are consistent with acute  diverticulitis. No evidence of perforation or abscess. 2.  Other chronic and incidental findings as detailed above. Electronically Signed   By:  Eddie Candle M.D.   On: 06/16/2019 16:47   Ct Abdomen Pelvis W Contrast  Result Date: 06/16/2019 CLINICAL DATA:  Fever, dementia, AMS, abdominal pain EXAM: CT CHEST, ABDOMEN, AND PELVIS WITH CONTRAST TECHNIQUE: Multidetector CT imaging of the chest, abdomen and pelvis was performed following the standard protocol during bolus administration of intravenous contrast. CONTRAST:  122mL OMNIPAQUE IOHEXOL 300 MG/ML  SOLN COMPARISON:  CT abdomen pelvis, 08/18/2015 FINDINGS: CT CHEST FINDINGS Cardiovascular: Aortic atherosclerosis. Normal heart size. Coronary artery calcifications. No pericardial effusion. Mediastinum/Nodes: No enlarged mediastinal, hilar, or axillary lymph nodes. Multiple small thyroid nodules. Trachea, and esophagus demonstrate no significant findings. Lungs/Pleura: Bandlike scarring in the bilateral lung bases. No pleural effusion or pneumothorax. Musculoskeletal: No chest wall mass or suspicious bone lesions identified. CT ABDOMEN PELVIS FINDINGS Hepatobiliary: No solid liver abnormality is seen. No gallstones, gallbladder wall thickening, or biliary dilatation. Pancreas: Unremarkable. No pancreatic ductal dilatation or surrounding inflammatory changes. Spleen: Normal in size without significant abnormality. Adrenals/Urinary Tract: Adrenal glands are unremarkable. Kidneys are normal, without renal calculi, solid lesion, or hydronephrosis. Bladder is unremarkable. Stomach/Bowel: Stomach is within normal limits. Large burden of stool and stool balls in the colon and rectum, with diverticulosis and inflammatory fat stranding about the proximal to mid sigmoid (series 2, image 94). Vascular/Lymphatic: Aortic atherosclerosis. No enlarged abdominal or pelvic lymph nodes. Reproductive: No mass or other abnormality. Other: No abdominal wall hernia or abnormality. No  abdominopelvic ascites. Musculoskeletal: No acute or significant osseous findings. Status post L1 kyphoplasty. IMPRESSION: 1. Large burden of stool and stool balls in the colon and rectum, with diverticulosis and inflammatory fat stranding about the proximal to mid sigmoid (series 2, image 94). Findings are consistent with acute diverticulitis. No evidence of perforation or abscess. 2.  Other chronic and incidental findings as detailed above. Electronically Signed   By: Eddie Candle M.D.   On: 06/16/2019 16:47   Dg Chest Portable 1 View  Result Date: 06/16/2019 CLINICAL DATA:  85 year old female with fever and altered mental status. Weakness for 2 days. EXAM: PORTABLE CHEST 1 VIEW COMPARISON:  Chest radiographs 01/02/2017 and earlier. FINDINGS: Portable AP semi upright view at 1444 hours. Low lung volumes with bilateral crowding of markings. Mediastinal contours appear stable and within normal limits. Visualized tracheal air column is within normal limits. No pneumothorax, pleural effusion, consolidation or definite pulmonary edema. Negative visible bowel gas pattern. No acute osseous abnormality identified. IMPRESSION: Low lung volumes. No acute cardiopulmonary abnormality identified. Electronically Signed   By: Genevie Ann M.D.   On: 06/16/2019 15:16     CBC Recent Labs  Lab 06/16/19 1419 06/17/19 0538  WBC 21.2* 18.5*  HGB 13.4 12.1  HCT 40.8 37.0  PLT 160 132*  MCV 98.6 98.7  MCH 32.4 32.3  MCHC 32.8 32.7  RDW 13.4 13.4    Chemistries  Recent Labs  Lab 06/16/19 1419 06/17/19 0538  NA 134* 135  K 3.7 3.7  CL 102 99  CO2 24 27  GLUCOSE 128* 124*  BUN 22 19  CREATININE 0.59 0.58  CALCIUM 7.9* 8.0*  AST 22  --   ALT 22  --   ALKPHOS 73  --   BILITOT 1.1  --    ------------------------------------------------------------------------------------------------------------------ estimated creatinine clearance is 54.5 mL/min (by C-G formula based on SCr of 0.58  mg/dL). ------------------------------------------------------------------------------------------------------------------ No results for input(s): HGBA1C in the last 72 hours. ------------------------------------------------------------------------------------------------------------------ No results for input(s): CHOL, HDL, LDLCALC, TRIG, CHOLHDL, LDLDIRECT in the last 72 hours. ------------------------------------------------------------------------------------------------------------------ No  results for input(s): TSH, T4TOTAL, T3FREE, THYROIDAB in the last 72 hours.  Invalid input(s): FREET3 ------------------------------------------------------------------------------------------------------------------ No results for input(s): VITAMINB12, FOLATE, FERRITIN, TIBC, IRON, RETICCTPCT in the last 72 hours.  Coagulation profile Recent Labs  Lab 06/16/19 1433  INR 1.1    No results for input(s): DDIMER in the last 72 hours.  Cardiac Enzymes No results for input(s): CKMB, TROPONINI, MYOGLOBIN in the last 168 hours.  Invalid input(s): CK ------------------------------------------------------------------------------------------------------------------ Invalid input(s): [place]  Patient is 85 year old with history of Parkinson's disease and some dementia presenting with worsening encephalopathy    Sepsis due to acute diverticulitis. WBC trending down Continue IV Cipro and Flagyl  Acute metabolic encephalopathy due to sepsis.  This has improved patient mental status is close to baseline according to husband.  Mild hyponatremia.    Improved with IV fluids  Parkinson's disease.  Continue home medication.  Severe constipation we will start her on a bowel regimen      Code Status Orders  (From admission, onward)         Start     Ordered   06/16/19 2158  Full code  Continuous     06/16/19 2157        Code Status History    Date Active Date  Inactive Code Status Order ID Comments User Context   09/11/2017 1543 09/11/2017 1844 Full Code [id number]  Hessie Knows, MD Inpatient   08/18/2015 0141 08/19/2015 1432 Full Code [id number]  Hower, Aaron Mose, MD ED   Advance Care Planning Activity    Advance Directive Documentation     Most Recent Value  Type of Advance Directive  Healthcare Power of [place]  Pre-existing out of facility DNR order (yellow form or pink MOST form)  -  "MOST" Form in Place?  -           Consultsnone  DVT Prophylaxis  Lovenox    Lab Results  Component Value Date   PLT 132 (L) 06/17/2019     Time Spent in minutes38min Greater than 50% of time spent in care coordination and counseling patient regarding the condition and plan of care.   Dustin Flock M.D on 06/17/2019 at 2:43 PM  Between 7am to 6pm - Pager - [phone]  After 6pm go to [email or web address] - Proofreader  [employer]   Office  [phone]

## 2019-06-17 NOTE — Evaluation (Signed)
Physical Therapy Evaluation Patient Details Name: Morgan Dennis MRN: [id number] DOB: Nov 15, 1934 Today's Date: 06/17/2019   History of Present Illness  Pt is an 85 year old female admitted with AMS and fever.  PMH includes RLS, PD, HLD, GERD and arthritis.  Clinical Impression  Pt is an 85 year old female who lives in a two story home with her husband.  She is a questionable historian but states that she is able to perform household tasks with a RW.  Pt able to perform bed mobility mod I and sit at EOB with good balance.  Pt required CGA for STS and ambulation as she appeared unsteady on her feet and experienced 2 self corrected LOB's.  Pt presented with good overall strength of UE/LE.  She was able to follow directions during there ex but required VC's to continue with movement throughout each set.  She would benefit from skilled PT with focus on balance, safe functional mobility and HEP.  Pt is appropriate for home with home health following discharge.    Follow Up Recommendations Home health PT;Supervision - Intermittent    Equipment Recommendations  None recommended by PT    Recommendations for Other Services       Precautions / Restrictions Precautions Precautions: Fall      Mobility  Bed Mobility Overal bed mobility: Needs Assistance Bed Mobility: Supine to Sit     Supine to sit: Min assist     General bed mobility comments: Hand held assist to get to EOB.  Transfers Overall transfer level: Needs assistance Equipment used: Rolling walker (2 wheeled) Transfers: Sit to/from Stand Sit to Stand: Min guard         General transfer comment: Pt able to rise from bedside, slightly unstable on feet and with need for VC's for use of RW.  Ambulation/Gait Ambulation/Gait assistance: Min guard Gait Distance (Feet): 50 Feet Assistive device: Rolling walker (2 wheeled)     Gait velocity interpretation: 1.31 - 2.62 ft/sec, indicative of limited community ambulator General  Gait Details: Fair foot clearance and step length with good use of RW.  Pt unsteady on feet when turning though able to self correct.  Stairs            Wheelchair Mobility    Modified Rankin (Stroke Patients Only)       Balance Overall balance assessment: Needs assistance   Sitting balance-Leahy Scale: Normal     Standing balance support: Bilateral upper extremity supported Standing balance-Leahy Scale: Fair Standing balance comment: Requires RW for support.                             Pertinent Vitals/Pain Pain Assessment: No/denies pain    Home Living Family/patient expects to be discharged to:: Private residence Living Arrangements: Spouse/significant other Available Help at Discharge: Family;Available 24 hours/day Type of Home: House Home Access: Stairs to enter Entrance Stairs-Rails: Can reach both Entrance Stairs-Number of Steps: Unable to specify Home Layout: Two level Home Equipment: Walker - 2 wheels;Cane - single point      Prior Function Level of Independence: Independent with assistive device(s)         Comments: Pt stated that she did not need assistance and is able to still cook and do some household tasks.     Hand Dominance        Extremity/Trunk Assessment   Upper Extremity Assessment Upper Extremity Assessment: Overall WFL for tasks assessed  Lower Extremity Assessment Lower Extremity Assessment: Overall WFL for tasks assessed(Grossly 4+/5 ankle and knee flexion/extension strength.)    Cervical / Trunk Assessment Cervical / Trunk Assessment: Kyphotic  Communication   Communication: HOH  Cognition Arousal/Alertness: Awake/alert Behavior During Therapy: WFL for tasks assessed/performed Overall Cognitive Status: Within Functional Limits for tasks assessed                                 General Comments: Pleasant and ready to work with therapy.      General Comments      Exercises General  Exercises - Lower Extremity Ankle Circles/Pumps: 20 reps;Both;Seated Quad Sets: Both;10 reps;Seated Heel Slides: Strengthening;10 reps;Seated Hip ABduction/ADduction: Strengthening;Both;10 reps;Seated   Assessment/Plan    PT Assessment Patient needs continued PT services  PT Problem List Decreased strength;Decreased mobility;Decreased balance;Decreased knowledge of use of DME;Decreased activity tolerance       PT Treatment Interventions DME instruction;Therapeutic activities;Gait training;Therapeutic exercise;Patient/family education;Stair training;Balance training;Functional mobility training    PT Goals (Current goals can be found in the Care Plan section)  Acute Rehab PT Goals Patient Stated Goal: To regain strength and balance. PT Goal Formulation: With patient Time For Goal Achievement: 07/01/19 Potential to Achieve Goals: Good    Frequency Min 2X/week   Barriers to discharge        Co-evaluation               AM-PAC PT "6 Clicks" Mobility  Outcome Measure Help needed turning from your back to your side while in a flat bed without using bedrails?: None Help needed moving from lying on your back to sitting on the side of a flat bed without using bedrails?: None Help needed moving to and from a bed to a chair (including a wheelchair)?: None Help needed standing up from a chair using your arms (e.g., wheelchair or bedside chair)?: A Little Help needed to walk in hospital room?: A Little Help needed climbing 3-5 steps with a railing? : A Little 6 Click Score: 21    End of Session Equipment Utilized During Treatment: Gait belt Activity Tolerance: Patient tolerated treatment well Patient left: in chair;with chair alarm set;with call bell/phone within reach   PT Visit Diagnosis: Unsteadiness on feet (R26.81);Muscle weakness (generalized) (M62.81);Difficulty in walking, not elsewhere classified (R26.2)    Time: 3646-8032 PT Time Calculation (min) (ACUTE ONLY): 17  min   Charges:   PT Evaluation $PT Eval Low Complexity: 1 Low PT Treatments $Therapeutic Exercise: 8-22 mins        Roxanne Gates, PT, DPT   Roxanne Gates 06/17/2019, 3:52 PM

## 2019-06-18 LAB — CBC WITH DIFFERENTIAL/PLATELET
Abs Immature Granulocytes: 0.05 10*3/uL (ref 0.00–0.07)
Basophils Absolute: 0 10*3/uL (ref 0.0–0.1)
Basophils Relative: 0 %
Eosinophils Absolute: 0.2 10*3/uL (ref 0.0–0.5)
Eosinophils Relative: 3 %
HCT: 33.2 % — ABNORMAL LOW (ref 36.0–46.0)
Hemoglobin: 10.7 g/dL — ABNORMAL LOW (ref 12.0–15.0)
Immature Granulocytes: 1 %
Lymphocytes Relative: 15 %
Lymphs Abs: 1.1 10*3/uL (ref 0.7–4.0)
MCH: 31.8 pg (ref 26.0–34.0)
MCHC: 32.2 g/dL (ref 30.0–36.0)
MCV: 98.5 fL (ref 80.0–100.0)
Monocytes Absolute: 1.1 10*3/uL — ABNORMAL HIGH (ref 0.1–1.0)
Monocytes Relative: 15 %
Neutro Abs: 4.9 10*3/uL (ref 1.7–7.7)
Neutrophils Relative %: 66 %
Platelets: 125 10*3/uL — ABNORMAL LOW (ref 150–400)
RBC: 3.37 MIL/uL — ABNORMAL LOW (ref 3.87–5.11)
RDW: 13.3 % (ref 11.5–15.5)
WBC: 7.4 10*3/uL (ref 4.0–10.5)
nRBC: 0 % (ref 0.0–0.2)

## 2019-06-18 LAB — BASIC METABOLIC PANEL
Anion gap: 7 (ref 5–15)
BUN: 13 mg/dL (ref 8–23)
CO2: 27 mmol/L (ref 22–32)
Calcium: 7.7 mg/dL — ABNORMAL LOW (ref 8.9–10.3)
Chloride: 104 mmol/L (ref 98–111)
Creatinine, Ser: 0.63 mg/dL (ref 0.44–1.00)
GFR calc Af Amer: >60 mL/min (ref >60)
GFR calc non Af Amer: >60 mL/min (ref >60)
Glucose, Bld: 112 mg/dL — ABNORMAL HIGH (ref 70–99)
Potassium: 3.7 mmol/L (ref 3.5–5.1)
Sodium: 138 mmol/L (ref 135–145)

## 2019-06-18 MED ORDER — SODIUM CHLORIDE 0.9% FLUSH
3.0000 mL | Freq: Two times a day (BID) | INTRAVENOUS | Status: DC
  Administered 2019-06-18 – 2019-06-19 (×3): 3 mL via INTRAVENOUS

## 2019-06-18 MED ORDER — METHOTREXATE 2.5 MG PO TABS
15.0000 mg | ORAL_TABLET | ORAL | Status: DC
  Administered 2019-06-18: 15 mg via ORAL
  Filled 2019-06-18 (×2): qty 6

## 2019-06-18 MED ORDER — SODIUM CHLORIDE 0.9% FLUSH
3.0000 mL | INTRAVENOUS | Status: DC | PRN
  Administered 2019-06-18: 3 mL via INTRAVENOUS
  Filled 2019-06-18: qty 3

## 2019-06-18 NOTE — TOC Initial Note (Addendum)
Transition of Care [facility]) - Initial/Assessment Note    Patient Details  Name: Morgan Dennis MRN: [id number] Date of Birth: 05-12-1934  Transition of Care [facility] CM/SW Contact:    Ross Ludwig, LCSW Phone Number: 06/18/2019, 6:31 PM  Clinical Narrative:                  Patient is an 85 year old female who is alert and oriented x2.  Patient has some dementia, patient's husband was at bedside, assessment completed by speaking to patient and her husband.  Patient lives with her husband, they have one son who visits regularly.  Patient has a bedside commode, walker, and cane, patient and husband did not express any DME needs.  Patient and husband planning to go return back home with home health.  CSW provided choice of home health agencies, and patient's husband stated their son had the name of an agency they wanted to use.  Patient's husband stated he will call CSW with name of agency, CSW awaiting for call back.  Patient plans to return home with home health services.  Expected Discharge Plan: [place] Barriers to Discharge: Continued Medical Work up   Patient Goals and CMS Choice Patient states their goals for this hospitalization and ongoing recovery are:: To return back home with husband CMS Medicare.gov Compare Post Acute Care list provided to:: Patient Represenative (must comment) Choice offered to / list presented to : Spouse  Expected Discharge Plan and Services Expected Discharge Plan: [place] In-house Referral: Clinical Social Work   Post Acute Care Choice: [place] arrangements for the past 2 months: [place]                 DME Arranged: N/A DME Agency: NA                  Prior Living Arrangements/Services Living arrangements for the past 2 months: Single Family Home Lives with:: Spouse Patient language and need for interpreter reviewed:: Yes Do you feel safe going back to the place where you live?: Yes       Need for Family Participation in Patient Care: Yes (Comment) Care giver support system in place?: Yes (comment) Current home services: DME Criminal Activity/Legal Involvement Pertinent to Current Situation/Hospitalization: No - Comment as needed  Activities of Daily Living Home Assistive Devices/Equipment: Walker (specify type), Bedside commode/3-in-1 ADL Screening (condition at time of admission) Patient's cognitive ability adequate to safely complete daily activities?: Yes Is the patient deaf or have difficulty hearing?: Yes(deaf in left and right hearing aid not with patient) Does the patient have difficulty seeing, even when wearing glasses/contacts?: No Does the patient have difficulty concentrating, remembering, or making decisions?: Yes Patient able to express need for assistance with ADLs?: Yes Does the patient have difficulty dressing or bathing?: No Independently performs ADLs?: No Communication: Independent Dressing (OT): Independent Grooming: Independent Feeding: Independent Bathing: Independent Toileting: Needs assistance Is this a change from baseline?: Pre-admission baseline In/Out Bed: Needs assistance Is this a change from baseline?: Pre-admission baseline Walks in Home: Needs assistance Is this a change from baseline?: Pre-admission baseline Does the patient have difficulty walking or climbing stairs?: Yes Weakness of Legs: Both Weakness of Arms/Hands: Both  Permission Sought/Granted Permission sought to share information with : Family Supports, Case Manager Permission granted to share information with : Yes, Verbal Permission Granted  Share Information with NAME: Jerzie, Bieri Spouse [phone]  [phone] or Vanwagner,A.J. Son  [phone]  Permission granted to share info w AGENCY: Normal        Emotional Assessment Appearance:: Appears stated age   Affect (typically observed): Accepting, Appropriate, Calm, Stable Orientation: :  Oriented to Self, Oriented to Place Alcohol / Substance Use: Not Applicable Psych Involvement: No (comment)  Admission diagnosis:  Acute diverticulitis [K57.92] Sepsis, due to unspecified organism, unspecified whether acute organ dysfunction present [facility]) [A41.9] Patient Active Problem List   Diagnosis Date Noted  . Sepsis ([place]) 06/16/2019  . Parkinson's disease ([place]) 04/23/2017  . Skin cancer 02/12/2017  . Chronic bronchitis ([place]) 01/02/2017  . Frequency of urination and polyuria 10/12/2015  . Upper GI bleed 08/18/2015  . Adaptation reaction 04/30/2015  . Acid reflux 04/30/2015  . HLD (hyperlipidemia) 04/30/2015  . Malaise and fatigue 04/30/2015  . Affective disorder, major 04/30/2015  . Bad memory 04/30/2015  . Mild cognitive disorder 04/30/2015  . Fungal infection of toenail 04/30/2015  . Arthritis, degenerative 04/30/2015  . OP (osteoporosis) 04/30/2015  . Arthritis or polyarthritis, rheumatoid ([place]) 04/30/2015  . Avitaminosis D 05/12/2014  . Trigger finger 05/12/2014  . Rheumatoid arthritis ([place]) 03/17/2014   PCP:  Jerrol Banana., MD Pharmacy:   [facility] [address], [place] [address] Phone: [phone] Fax: [place], [place] [address] [place] [address] Phone: [phone] Fax: [place], [place] [address] Phone: [phone] Fax: [phone]     Social Determinants of Health (SDOH) Interventions    Readmission Risk Interventions No flowsheet data found.

## 2019-06-18 NOTE — Plan of Care (Signed)
  Problem: Clinical Measurements: Goal: Diagnostic test results will improve Outcome: Progressing Goal: Signs and symptoms of infection will decrease Outcome: Progressing

## 2019-06-18 NOTE — Progress Notes (Signed)
[name] at [facility]                                                                                                                                                                                  Patient Demographics   Morgan Dennis, is a 85 y.o. female, DOB - [birth year], KDX:[id number]  Admit date - 06/16/2019   Admitting Physician Demetrios Loll, MD  Outpatient Primary MD for the patient is Jerrol Banana., MD   LOS - 2  Subjective: Patient is having some abdominal pain in the right lower quadrant    Review of Systems:   CONSTITUTIONAL: Unable to provide due to her dementia and memory issues  Vitals:   Vitals:   06/17/19 1650 06/17/19 2043 06/18/19 0356 06/18/19 0729  BP: 119/64 (!) 130/47 128/61 131/61  Pulse: 82 79 78 79  Resp: 16 20 20 19   Temp: 97.8 F (36.6 C) (!) 97.5 F (36.4 C) 97.8 F (36.6 C) 98.1 F (36.7 C)  TempSrc: Oral Oral Oral Oral  SpO2: 93% 91% 93% 94%  Weight:      Height:        Wt Readings from Last 3 Encounters:  06/16/19 77.1 kg  03/26/19 75.9 kg  10/11/18 70.8 kg     Intake/Output Summary (Last 24 hours) at 06/18/2019 1431 Last data filed at 06/18/2019 0653 Gross per 24 hour  Intake 852.45 ml  Output 1600 ml  Net -747.55 ml    Physical Exam:   GENERAL: Pleasant-appearing in no apparent distress.  HEAD, EYES, EARS, NOSE AND THROAT: Atraumatic, normocephalic. Extraocular muscles are intact. Pupils equal and reactive to light. Sclerae anicteric. No conjunctival injection. No oro-pharyngeal erythema.  NECK: Supple. There is no jugular venous distention. No bruits, no lymphadenopathy, no thyromegaly.  HEART: Regular rate and rhythm,. No murmurs, no rubs, no clicks.  LUNGS: Clear to auscultation bilaterally. No rales or rhonchi. No wheezes.  ABDOMEN: Soft, flat, right lower quadrant tenderness nondistended. Has good bowel sounds. No hepatosplenomegaly appreciated.  EXTREMITIES: No evidence of any cyanosis,  clubbing, or peripheral edema.  +2 pedal and radial pulses bilaterally.  NEUROLOGIC: The patient is alert, not oriented SKIN: Moist and warm with no rashes appreciated.  Psych: Not anxious, depressed LN: No inguinal LN enlargement    Antibiotics   Anti-infectives (From admission, onward)   Start     Dose/Rate Route Frequency Ordered Stop   06/17/19 1600  ciprofloxacin (CIPRO) IVPB 400 mg     400 mg 200 mL/hr over 60 Minutes Intravenous Every 12 hours 06/16/19 2157     06/16/19 2200  metroNIDAZOLE (FLAGYL) IVPB 500 mg     500  mg 100 mL/hr over 60 Minutes Intravenous Every 8 hours 06/16/19 2157     06/16/19 1515  levofloxacin (LEVAQUIN) IVPB 750 mg     750 mg 100 mL/hr over 90 Minutes Intravenous  Once 06/16/19 1511 06/16/19 1726      Medications   Scheduled Meds: . carbidopa-levodopa  1 tablet Oral QID  . docusate sodium  200 mg Oral BID  . donepezil  10 mg Oral Q supper  . DULoxetine  60 mg Oral Daily  . enoxaparin (LOVENOX) injection  40 mg Subcutaneous Q24H  . folic acid  1 mg Oral Daily  . gabapentin  600 mg Oral TID  . Melatonin  5 mg Oral QHS  . memantine  5 mg Oral Daily  . methotrexate  15 mg Oral Weekly  . pantoprazole  40 mg Oral Daily  . polyethylene glycol  17 g Oral Daily  . pregabalin  150 mg Oral BID  . rOPINIRole  0.25 mg Oral QID  . sodium chloride flush  3 mL Intravenous Q12H   Continuous Infusions: . sodium chloride 250 mL (06/17/19 2215)  . ciprofloxacin 400 mg (06/18/19 0400)  . metronidazole 500 mg (06/18/19 1353)   PRN Meds:.sodium chloride, acetaminophen **OR** acetaminophen, albuterol, bisacodyl, HYDROcodone-acetaminophen, ondansetron **OR** ondansetron (ZOFRAN) IV, senna-docusate, sodium chloride flush   Data Review:   Micro Results Recent Results (from the past 240 hour(s))  Culture, blood (Routine x 2)     Status: None (Preliminary result)   Collection Time: 06/16/19  2:30 PM   Specimen: BLOOD  Result Value Ref Range Status    Specimen Description BLOOD BLOOD RIGHT FOREARM  Final   Special Requests   Final    BOTTLES DRAWN AEROBIC AND ANAEROBIC Blood Culture results may not be optimal due to an excessive volume of blood received in culture bottles   Culture   Final    NO GROWTH 2 DAYS Performed at [facility], [address] [place], [place] [address]    Report Status PENDING  Incomplete  Culture, blood (Routine x 2)     Status: None (Preliminary result)   Collection Time: 06/16/19  2:31 PM   Specimen: BLOOD  Result Value Ref Range Status   Specimen Description BLOOD BLOOD LEFT FOREARM  Final   Special Requests   Final    BOTTLES DRAWN AEROBIC AND ANAEROBIC Blood Culture results may not be optimal due to an excessive volume of blood received in culture bottles   Culture   Final    NO GROWTH 2 DAYS Performed at [facility], [address], [place], [place] [address]    Report Status PENDING  Incomplete  SARS Coronavirus 2 (CEPHEID- Performed in [place] hospital lab), Hosp Order     Status: None   Collection Time: 06/16/19  2:42 PM   Specimen: Nasopharyngeal Swab  Result Value Ref Range Status   SARS Coronavirus 2 NEGATIVE NEGATIVE Final    Comment: (NOTE) If result is NEGATIVE SARS-CoV-2 target nucleic acids are NOT DETECTED. The SARS-CoV-2 RNA is generally detectable in upper and lower  respiratory specimens during the acute phase of infection. The lowest  concentration of SARS-CoV-2 viral copies this assay can detect is 250  copies / mL. A negative result does not preclude SARS-CoV-2 infection  and should not be used as the sole basis for treatment or other  patient management decisions.  A negative result may occur with  improper specimen collection / handling, submission of specimen other  than  nasopharyngeal swab, presence of viral mutation(s) within the  areas targeted by this assay, and inadequate number of viral copies  (<250 copies / mL). A negative result must be  combined with clinical  observations, patient history, and epidemiological information. If result is POSITIVE SARS-CoV-2 target nucleic acids are DETECTED. The SARS-CoV-2 RNA is generally detectable in upper and lower  respiratory specimens dur ing the acute phase of infection.  Positive  results are indicative of active infection with SARS-CoV-2.  Clinical  correlation with patient history and other diagnostic information is  necessary to determine patient infection status.  Positive results do  not rule out bacterial infection or co-infection with other viruses. If result is PRESUMPTIVE POSTIVE SARS-CoV-2 nucleic acids MAY BE PRESENT.   A presumptive positive result was obtained on the submitted specimen  and confirmed on repeat testing.  While 2019 novel coronavirus  (SARS-CoV-2) nucleic acids may be present in the submitted sample  additional confirmatory testing may be necessary for epidemiological  and / or clinical management purposes  to differentiate between  SARS-CoV-2 and other Sarbecovirus currently known to infect humans.  If clinically indicated additional testing with an alternate test  methodology [id number]) is advised. The SARS-CoV-2 RNA is generally  detectable in upper and lower respiratory sp ecimens during the acute  phase of infection. The expected result is Negative. Fact Sheet for Patients:  StrictlyIdeas.no Fact Sheet for Healthcare Providers: BankingDealers.co.za This test is not yet approved or cleared by the [place] FDA and has been authorized for detection and/or diagnosis of SARS-CoV-2 by FDA under an Emergency Use Authorization (EUA).  This EUA will remain in effect (meaning this test can be used) for the duration of the COVID-19 declaration under Section 564(b)(1) of the Act, 21 U.S.C. section 360bbb-3(b)(1), unless the authorization is terminated or revoked sooner. Performed at [facility], [address] [place], [place] [address]     Radiology Reports Ct Chest W Contrast  Result Date: 06/16/2019 CLINICAL DATA:  Fever, dementia, AMS, abdominal pain EXAM: CT CHEST, ABDOMEN, AND PELVIS WITH CONTRAST TECHNIQUE: Multidetector CT imaging of the chest, abdomen and pelvis was performed following the standard protocol during bolus administration of intravenous contrast. CONTRAST:  166mL OMNIPAQUE IOHEXOL 300 MG/ML  SOLN COMPARISON:  CT abdomen pelvis, 08/18/2015 FINDINGS: CT CHEST FINDINGS Cardiovascular: Aortic atherosclerosis. Normal heart size. Coronary artery calcifications. No pericardial effusion. Mediastinum/Nodes: No enlarged mediastinal, hilar, or axillary lymph nodes. Multiple small thyroid nodules. Trachea, and esophagus demonstrate no significant findings. Lungs/Pleura: Bandlike scarring in the bilateral lung bases. No pleural effusion or pneumothorax. Musculoskeletal: No chest wall mass or suspicious bone lesions identified. CT ABDOMEN PELVIS FINDINGS Hepatobiliary: No solid liver abnormality is seen. No gallstones, gallbladder wall thickening, or biliary dilatation. Pancreas: Unremarkable. No pancreatic ductal dilatation or surrounding inflammatory changes. Spleen: Normal in size without significant abnormality. Adrenals/Urinary Tract: Adrenal glands are unremarkable. Kidneys are normal, without renal calculi, solid lesion, or hydronephrosis. Bladder is unremarkable. Stomach/Bowel: Stomach is within normal limits. Large burden of stool and stool balls in the colon and rectum, with diverticulosis and inflammatory fat stranding about the proximal to mid sigmoid (series 2, image 94). Vascular/Lymphatic: Aortic atherosclerosis. No enlarged abdominal or pelvic lymph nodes. Reproductive: No mass or other abnormality. Other: No abdominal wall hernia or abnormality. No abdominopelvic ascites. Musculoskeletal: No acute or significant osseous findings. Status post L1 kyphoplasty.  IMPRESSION: 1. Large burden of stool and stool balls in the colon and rectum, with diverticulosis and inflammatory fat stranding about  the proximal to mid sigmoid (series 2, image 94). Findings are consistent with acute diverticulitis. No evidence of perforation or abscess. 2.  Other chronic and incidental findings as detailed above. Electronically Signed   By: Eddie Candle M.D.   On: 06/16/2019 16:47   Ct Abdomen Pelvis W Contrast  Result Date: 06/16/2019 CLINICAL DATA:  Fever, dementia, AMS, abdominal pain EXAM: CT CHEST, ABDOMEN, AND PELVIS WITH CONTRAST TECHNIQUE: Multidetector CT imaging of the chest, abdomen and pelvis was performed following the standard protocol during bolus administration of intravenous contrast. CONTRAST:  119mL OMNIPAQUE IOHEXOL 300 MG/ML  SOLN COMPARISON:  CT abdomen pelvis, 08/18/2015 FINDINGS: CT CHEST FINDINGS Cardiovascular: Aortic atherosclerosis. Normal heart size. Coronary artery calcifications. No pericardial effusion. Mediastinum/Nodes: No enlarged mediastinal, hilar, or axillary lymph nodes. Multiple small thyroid nodules. Trachea, and esophagus demonstrate no significant findings. Lungs/Pleura: Bandlike scarring in the bilateral lung bases. No pleural effusion or pneumothorax. Musculoskeletal: No chest wall mass or suspicious bone lesions identified. CT ABDOMEN PELVIS FINDINGS Hepatobiliary: No solid liver abnormality is seen. No gallstones, gallbladder wall thickening, or biliary dilatation. Pancreas: Unremarkable. No pancreatic ductal dilatation or surrounding inflammatory changes. Spleen: Normal in size without significant abnormality. Adrenals/Urinary Tract: Adrenal glands are unremarkable. Kidneys are normal, without renal calculi, solid lesion, or hydronephrosis. Bladder is unremarkable. Stomach/Bowel: Stomach is within normal limits. Large burden of stool and stool balls in the colon and rectum, with diverticulosis and inflammatory fat stranding about the proximal  to mid sigmoid (series 2, image 94). Vascular/Lymphatic: Aortic atherosclerosis. No enlarged abdominal or pelvic lymph nodes. Reproductive: No mass or other abnormality. Other: No abdominal wall hernia or abnormality. No abdominopelvic ascites. Musculoskeletal: No acute or significant osseous findings. Status post L1 kyphoplasty. IMPRESSION: 1. Large burden of stool and stool balls in the colon and rectum, with diverticulosis and inflammatory fat stranding about the proximal to mid sigmoid (series 2, image 94). Findings are consistent with acute diverticulitis. No evidence of perforation or abscess. 2.  Other chronic and incidental findings as detailed above. Electronically Signed   By: Eddie Candle M.D.   On: 06/16/2019 16:47   Dg Chest Portable 1 View  Result Date: 06/16/2019 CLINICAL DATA:  85 year old female with fever and altered mental status. Weakness for 2 days. EXAM: PORTABLE CHEST 1 VIEW COMPARISON:  Chest radiographs 01/02/2017 and earlier. FINDINGS: Portable AP semi upright view at 1444 hours. Low lung volumes with bilateral crowding of markings. Mediastinal contours appear stable and within normal limits. Visualized tracheal air column is within normal limits. No pneumothorax, pleural effusion, consolidation or definite pulmonary edema. Negative visible bowel gas pattern. No acute osseous abnormality identified. IMPRESSION: Low lung volumes. No acute cardiopulmonary abnormality identified. Electronically Signed   By: Genevie Ann M.D.   On: 06/16/2019 15:16     CBC Recent Labs  Lab 06/16/19 1419 06/17/19 0538 06/18/19 0842  WBC 21.2* 18.5* 7.4  HGB 13.4 12.1 10.7*  HCT 40.8 37.0 33.2*  PLT 160 132* 125*  MCV 98.6 98.7 98.5  MCH 32.4 32.3 31.8  MCHC 32.8 32.7 32.2  RDW 13.4 13.4 13.3  LYMPHSABS  --   --  1.1  MONOABS  --   --  1.1*  EOSABS  --   --  0.2  BASOSABS  --   --  0.0    Chemistries  Recent Labs  Lab 06/16/19 1419 06/17/19 0538 06/18/19 0842  NA 134* 135 138  K 3.7  3.7 3.7  CL 102 99 104  CO2 24 27  27  GLUCOSE 128* 124* 112*  BUN 22 19 13   CREATININE 0.59 0.58 0.63  CALCIUM 7.9* 8.0* 7.7*  AST 22  --   --   ALT 22  --   --   ALKPHOS 73  --   --   BILITOT 1.1  --   --    ------------------------------------------------------------------------------------------------------------------ estimated creatinine clearance is 54.5 mL/min (by C-G formula based on SCr of 0.63 mg/dL). ------------------------------------------------------------------------------------------------------------------ No results for input(s): HGBA1C in the last 72 hours. ------------------------------------------------------------------------------------------------------------------ No results for input(s): CHOL, HDL, LDLCALC, TRIG, CHOLHDL, LDLDIRECT in the last 72 hours. ------------------------------------------------------------------------------------------------------------------ No results for input(s): TSH, T4TOTAL, T3FREE, THYROIDAB in the last 72 hours.  Invalid input(s): FREET3 ------------------------------------------------------------------------------------------------------------------ No results for input(s): VITAMINB12, FOLATE, FERRITIN, TIBC, IRON, RETICCTPCT in the last 72 hours.  Coagulation profile Recent Labs  Lab 06/16/19 1433  INR 1.1    No results for input(s): DDIMER in the last 72 hours.  Cardiac Enzymes No results for input(s): CKMB, TROPONINI, MYOGLOBIN in the last 168 hours.  Invalid input(s): CK ------------------------------------------------------------------------------------------------------------------ Invalid input(s): [place]  Patient is 85 year old with history of Parkinson's disease and some dementia presenting with worsening encephalopathy    Sepsis due to acute diverticulitis. Patient's WBC is now trending down Still having some pain I will continue 1 more days of IV antibiotics  Acute metabolic  encephalopathy due to sepsis.  This has improved patient mental status is close to baseline according to husband.  Mild hyponatremia.    Improved with IV fluids  Parkinson's disease.  Continue home medication.  Severe constipation patient having bowel movement with current regimen  Plan for discharge tomorrow      Code Status Orders  (From admission, onward)         Start     Ordered   06/16/19 2158  Full code  Continuous     06/16/19 2157        Code Status History    Date Active Date Inactive Code Status Order ID Comments User Context   09/11/2017 1543 09/11/2017 1844 Full Code [id number]  Hessie Knows, MD Inpatient   08/18/2015 0141 08/19/2015 1432 Full Code [id number]  Hower, Aaron Mose, MD ED   Advance Care Planning Activity    Advance Directive Documentation     Most Recent Value  Type of Advance Directive  Healthcare Power of [place]  Pre-existing out of facility DNR order (yellow form or pink MOST form)  -  "MOST" Form in Place?  -           Consultsnone  DVT Prophylaxis  Lovenox    Lab Results  Component Value Date   PLT 125 (L) 06/18/2019     Time Spent in minutes38min Greater than 50% of time spent in care coordination and counseling patient regarding the condition and plan of care.   Dustin Flock M.D on 06/18/2019 at 2:31 PM  Between 7am to 6pm - Pager - [phone]  After 6pm go to [email or web address] - Proofreader  [employer]   Office  [phone]

## 2019-06-18 NOTE — Plan of Care (Signed)
  Problem: Clinical Measurements: Goal: Signs and symptoms of infection will decrease Outcome: Progressing   

## 2019-06-19 MED ORDER — POLYETHYLENE GLYCOL 3350 17 G PO PACK: 17.0000 g | PACK | Freq: Every day | ORAL | 0 refills | Status: DC

## 2019-06-19 MED ORDER — CIPROFLOXACIN HCL 500 MG PO TABS: 500.0000 mg | ORAL_TABLET | Freq: Two times a day (BID) | ORAL | 0 refills | Status: AC

## 2019-06-19 MED ORDER — METRONIDAZOLE 500 MG PO TABS: 500.0000 mg | ORAL_TABLET | Freq: Three times a day (TID) | ORAL | 0 refills | Status: AC

## 2019-06-19 NOTE — Progress Notes (Signed)
Discharged to home with her husband.  Prescriptions sent to [facility] in [place].  Information regarding high fiber diet provided.  Home PT has been set up for her.

## 2019-06-19 NOTE — Plan of Care (Signed)
  Problem: Clinical Measurements: Goal: Diagnostic test results will improve Outcome: Progressing Goal: Signs and symptoms of infection will decrease Outcome: Progressing

## 2019-06-19 NOTE — Care Management Important Message (Signed)
Important Message  Patient Details  Name: Morgan Dennis MRN: [id number] Date of Birth: May 15, 1934   Medicare Important Message Given:  Yes  Initial Medicare IM given by Patient Access Associate on 06/18/2019 at 11:44am.  Still valid.   Dannette Barbara 06/19/2019, 12:25 PM

## 2019-06-19 NOTE — Discharge Instructions (Signed)

## 2019-06-19 NOTE — Discharge Summary (Signed)
[facility] [facility] [place] at [facility], 85 y.o., DOB December 11, 1934, MRN [id number]. Admission date: 06/16/2019 Discharge Date 06/19/2019 Primary MD Jerrol Banana., MD Admitting Physician Demetrios Loll, MD  Admission Diagnosis  Acute diverticulitis [K57.92] Sepsis, due to unspecified organism, unspecified whether acute organ dysfunction present [facility]) [A41.9]  Discharge Diagnosis   Active Problems:   Sepsis ([place]) due to acute diverticulitis Acute metabolic encephalopathy due to sepsis Mild hyponatremia Parkinson's disease Severe constipation     Hospital Course Morgan Dennis  is a 85 y.o. female with a known history of arthritis, GERD, hyperlipidemia, Parkinson's disease and restless syndrome.  The patient is sent from [facility] clinic due to fever 102 .  Patient was sent to the emergency room for further evaluation.  She underwent a CT scan of the abdomen which showed acute diverticulitis and constipation.  Patient was admitted to the hospital given IV fluids and antibiotics.  Also given stool softeners with significant improvement in her symptoms.  Patient does not have any further abdominal pain and is doing well for discharge home.           Consults  None  Significant Tests:  See full reports for all details     Ct Chest W Contrast  Result Date: 06/16/2019 CLINICAL DATA:  Fever, dementia, AMS, abdominal pain EXAM: CT CHEST, ABDOMEN, AND PELVIS WITH CONTRAST TECHNIQUE: Multidetector CT imaging of the chest, abdomen and pelvis was performed following the standard protocol during bolus administration of intravenous contrast. CONTRAST:  117mL OMNIPAQUE IOHEXOL 300 MG/ML  SOLN COMPARISON:  CT abdomen pelvis, 08/18/2015 FINDINGS: CT CHEST FINDINGS Cardiovascular: Aortic atherosclerosis. Normal heart size. Coronary artery calcifications. No pericardial effusion. Mediastinum/Nodes: No enlarged mediastinal, hilar, or axillary lymph nodes. Multiple small thyroid  nodules. Trachea, and esophagus demonstrate no significant findings. Lungs/Pleura: Bandlike scarring in the bilateral lung bases. No pleural effusion or pneumothorax. Musculoskeletal: No chest wall mass or suspicious bone lesions identified. CT ABDOMEN PELVIS FINDINGS Hepatobiliary: No solid liver abnormality is seen. No gallstones, gallbladder wall thickening, or biliary dilatation. Pancreas: Unremarkable. No pancreatic ductal dilatation or surrounding inflammatory changes. Spleen: Normal in size without significant abnormality. Adrenals/Urinary Tract: Adrenal glands are unremarkable. Kidneys are normal, without renal calculi, solid lesion, or hydronephrosis. Bladder is unremarkable. Stomach/Bowel: Stomach is within normal limits. Large burden of stool and stool balls in the colon and rectum, with diverticulosis and inflammatory fat stranding about the proximal to mid sigmoid (series 2, image 94). Vascular/Lymphatic: Aortic atherosclerosis. No enlarged abdominal or pelvic lymph nodes. Reproductive: No mass or other abnormality. Other: No abdominal wall hernia or abnormality. No abdominopelvic ascites. Musculoskeletal: No acute or significant osseous findings. Status post L1 kyphoplasty. IMPRESSION: 1. Large burden of stool and stool balls in the colon and rectum, with diverticulosis and inflammatory fat stranding about the proximal to mid sigmoid (series 2, image 94). Findings are consistent with acute diverticulitis. No evidence of perforation or abscess. 2.  Other chronic and incidental findings as detailed above. Electronically Signed   By: Eddie Candle M.D.   On: 06/16/2019 16:47   Ct Abdomen Pelvis W Contrast  Result Date: 06/16/2019 CLINICAL DATA:  Fever, dementia, AMS, abdominal pain EXAM: CT CHEST, ABDOMEN, AND PELVIS WITH CONTRAST TECHNIQUE: Multidetector CT imaging of the chest, abdomen and pelvis was performed following the standard protocol during bolus administration of intravenous contrast.  CONTRAST:  133mL OMNIPAQUE IOHEXOL 300 MG/ML  SOLN COMPARISON:  CT abdomen pelvis, 08/18/2015 FINDINGS: CT CHEST FINDINGS Cardiovascular: Aortic atherosclerosis.  Normal heart size. Coronary artery calcifications. No pericardial effusion. Mediastinum/Nodes: No enlarged mediastinal, hilar, or axillary lymph nodes. Multiple small thyroid nodules. Trachea, and esophagus demonstrate no significant findings. Lungs/Pleura: Bandlike scarring in the bilateral lung bases. No pleural effusion or pneumothorax. Musculoskeletal: No chest wall mass or suspicious bone lesions identified. CT ABDOMEN PELVIS FINDINGS Hepatobiliary: No solid liver abnormality is seen. No gallstones, gallbladder wall thickening, or biliary dilatation. Pancreas: Unremarkable. No pancreatic ductal dilatation or surrounding inflammatory changes. Spleen: Normal in size without significant abnormality. Adrenals/Urinary Tract: Adrenal glands are unremarkable. Kidneys are normal, without renal calculi, solid lesion, or hydronephrosis. Bladder is unremarkable. Stomach/Bowel: Stomach is within normal limits. Large burden of stool and stool balls in the colon and rectum, with diverticulosis and inflammatory fat stranding about the proximal to mid sigmoid (series 2, image 94). Vascular/Lymphatic: Aortic atherosclerosis. No enlarged abdominal or pelvic lymph nodes. Reproductive: No mass or other abnormality. Other: No abdominal wall hernia or abnormality. No abdominopelvic ascites. Musculoskeletal: No acute or significant osseous findings. Status post L1 kyphoplasty. IMPRESSION: 1. Large burden of stool and stool balls in the colon and rectum, with diverticulosis and inflammatory fat stranding about the proximal to mid sigmoid (series 2, image 94). Findings are consistent with acute diverticulitis. No evidence of perforation or abscess. 2.  Other chronic and incidental findings as detailed above. Electronically Signed   By: Eddie Candle M.D.   On: 06/16/2019 16:47    Dg Chest Portable 1 View  Result Date: 06/16/2019 CLINICAL DATA:  85 year old female with fever and altered mental status. Weakness for 2 days. EXAM: PORTABLE CHEST 1 VIEW COMPARISON:  Chest radiographs 01/02/2017 and earlier. FINDINGS: Portable AP semi upright view at 1444 hours. Low lung volumes with bilateral crowding of markings. Mediastinal contours appear stable and within normal limits. Visualized tracheal air column is within normal limits. No pneumothorax, pleural effusion, consolidation or definite pulmonary edema. Negative visible bowel gas pattern. No acute osseous abnormality identified. IMPRESSION: Low lung volumes. No acute cardiopulmonary abnormality identified. Electronically Signed   By: Genevie Ann M.D.   On: 06/16/2019 15:16       Today   Subjective:   Morgan Dennis   patient feeling better denies any complaints  Objective:   Blood pressure (!) 141/61, pulse 81, temperature 99.3 F (37.4 C), temperature source Oral, resp. rate 19, height 5\' 4"  (1.626 m), weight 77.1 kg, SpO2 94 %.  .  Intake/Output Summary (Last 24 hours) at 06/19/2019 1512 Last data filed at 06/19/2019 1103 Gross per 24 hour  Intake 337.81 ml  Output 1204 ml  Net -866.19 ml    Exam VITAL SIGNS: Blood pressure (!) 141/61, pulse 81, temperature 99.3 F (37.4 C), temperature source Oral, resp. rate 19, height 5\' 4"  (1.626 m), weight 77.1 kg, SpO2 94 %.  GENERAL:  85 y.o.-year-old patient lying in the bed with no acute distress.  EYES: Pupils equal, round, reactive to light and accommodation. No scleral icterus. Extraocular muscles intact.  HEENT: Head atraumatic, normocephalic. Oropharynx and nasopharynx clear.  NECK:  Supple, no jugular venous distention. No thyroid enlargement, no tenderness.  LUNGS: Normal breath sounds bilaterally, no wheezing, rales,rhonchi or crepitation. No use of accessory muscles of respiration.  CARDIOVASCULAR: S1, S2 normal. No murmurs, rubs, or gallops.  ABDOMEN: Soft,  nontender, nondistended. Bowel sounds present. No organomegaly or mass.  EXTREMITIES: No pedal edema, cyanosis, or clubbing.  NEUROLOGIC: Cranial nerves II through XII are intact. Muscle strength 5/5 in all extremities. Sensation intact. Gait not checked.  PSYCHIATRIC: The patient is alert but not oriented to place  sKIN: No obvious rash, lesion, or ulcer.   Data Review     CBC w Diff:  Lab Results  Component Value Date   WBC 7.4 06/18/2019   HGB 10.7 (L) 06/18/2019   HGB 13.6 03/26/2019   HCT 33.2 (L) 06/18/2019   HCT 40.8 03/26/2019   PLT 125 (L) 06/18/2019   PLT 173 03/26/2019   LYMPHOPCT 15 06/18/2019   MONOPCT 15 06/18/2019   EOSPCT 3 06/18/2019   BASOPCT 0 06/18/2019   CMP:  Lab Results  Component Value Date   NA 138 06/18/2019   NA 141 03/26/2019   K 3.7 06/18/2019   CL 104 06/18/2019   CO2 27 06/18/2019   BUN 13 06/18/2019   BUN 20 03/26/2019   CREATININE 0.63 06/18/2019   CREATININE 0.66 11/22/2017   GLU 92 01/29/2014   PROT 7.0 06/16/2019   PROT 6.5 03/26/2019   ALBUMIN 3.7 06/16/2019   ALBUMIN 4.1 03/26/2019   BILITOT 1.1 06/16/2019   BILITOT 0.2 03/26/2019   ALKPHOS 73 06/16/2019   AST 22 06/16/2019   ALT 22 06/16/2019  .  Micro Results Recent Results (from the past 240 hour(s))  Culture, blood (Routine x 2)     Status: None (Preliminary result)   Collection Time: 06/16/19  2:30 PM   Specimen: BLOOD  Result Value Ref Range Status   Specimen Description BLOOD BLOOD RIGHT FOREARM  Final   Special Requests   Final    BOTTLES DRAWN AEROBIC AND ANAEROBIC Blood Culture results may not be optimal due to an excessive volume of blood received in culture bottles   Culture   Final    NO GROWTH 3 DAYS Performed at [facility], [address] [place], [place] [address]    Report Status PENDING  Incomplete  Culture, blood (Routine x 2)     Status: None (Preliminary result)   Collection Time: 06/16/19  2:31 PM   Specimen: BLOOD  Result Value  Ref Range Status   Specimen Description BLOOD BLOOD LEFT FOREARM  Final   Special Requests   Final    BOTTLES DRAWN AEROBIC AND ANAEROBIC Blood Culture results may not be optimal due to an excessive volume of blood received in culture bottles   Culture   Final    NO GROWTH 3 DAYS Performed at [facility], [address], [place], [place] [address]    Report Status PENDING  Incomplete  SARS Coronavirus 2 (CEPHEID- Performed in [place] hospital lab), Hosp Order     Status: None   Collection Time: 06/16/19  2:42 PM   Specimen: Nasopharyngeal Swab  Result Value Ref Range Status   SARS Coronavirus 2 NEGATIVE NEGATIVE Final    Comment: (NOTE) If result is NEGATIVE SARS-CoV-2 target nucleic acids are NOT DETECTED. The SARS-CoV-2 RNA is generally detectable in upper and lower  respiratory specimens during the acute phase of infection. The lowest  concentration of SARS-CoV-2 viral copies this assay can detect is 250  copies / mL. A negative result does not preclude SARS-CoV-2 infection  and should not be used as the sole basis for treatment or other  patient management decisions.  A negative result may occur with  improper specimen collection / handling, submission of specimen other  than nasopharyngeal swab, presence of viral mutation(s) within the  areas targeted by this assay, and inadequate number of viral copies  (<250 copies / mL). A negative result  must be combined with clinical  observations, patient history, and epidemiological information. If result is POSITIVE SARS-CoV-2 target nucleic acids are DETECTED. The SARS-CoV-2 RNA is generally detectable in upper and lower  respiratory specimens dur ing the acute phase of infection.  Positive  results are indicative of active infection with SARS-CoV-2.  Clinical  correlation with patient history and other diagnostic information is  necessary to determine patient infection status.  Positive results do  not rule out  bacterial infection or co-infection with other viruses. If result is PRESUMPTIVE POSTIVE SARS-CoV-2 nucleic acids MAY BE PRESENT.   A presumptive positive result was obtained on the submitted specimen  and confirmed on repeat testing.  While 2019 novel coronavirus  (SARS-CoV-2) nucleic acids may be present in the submitted sample  additional confirmatory testing may be necessary for epidemiological  and / or clinical management purposes  to differentiate between  SARS-CoV-2 and other Sarbecovirus currently known to infect humans.  If clinically indicated additional testing with an alternate test  methodology [phone]) is advised. The SARS-CoV-2 RNA is generally  detectable in upper and lower respiratory sp ecimens during the acute  phase of infection. The expected result is Negative. Fact Sheet for Patients:  StrictlyIdeas.no Fact Sheet for Healthcare Providers: BankingDealers.co.za This test is not yet approved or cleared by the [place] FDA and has been authorized for detection and/or diagnosis of SARS-CoV-2 by FDA under an Emergency Use Authorization (EUA).  This EUA will remain in effect (meaning this test can be used) for the duration of the COVID-19 declaration under Section 564(b)(1) of the Act, 21 U.S.C. section 360bbb-3(b)(1), unless the authorization is terminated or revoked sooner. Performed at [facility], [address] [place], [place] [address]         Code Status Orders  (From admission, onward)         Start     Ordered   06/16/19 2158  Full code  Continuous     06/16/19 2157        Code Status History    Date Active Date Inactive Code Status Order ID Comments User Context   09/11/2017 1543 09/11/2017 1844 Full Code [id number]  Hessie Knows, MD Inpatient   08/18/2015 0141 08/19/2015 1432 Full Code [id number]  Hower, Aaron Mose, MD ED   Advance Care Planning Activity    Advance Directive  Documentation     Most Recent Value  Type of Advance Directive  Healthcare Power of [place]  Pre-existing out of facility DNR order (yellow form or pink MOST form)  -  "MOST" Form in Place?  -          Follow-up Information    Jerrol Banana., MD. Go on 06/25/2019.   Specialty: Family Medicine Why: appointment at 8:20am Contact information: [address] [place] [address] [phone]           Discharge Medications   Allergies as of 06/19/2019      Reactions   Penicillins Swelling, Rash   Other reaction(s): Rash Has patient had a PCN reaction causing immediate rash, facial/tongue/throat swelling, SOB or lightheadedness with hypotension: Yes Has patient had a PCN reaction causing severe rash involving mucus membranes or skin necrosis: Unknown Has patient had a PCN reaction that required hospitalization:Yes Has patient had a PCN reaction occurring within the last 10 years: No If all of the above answers are "NO", then may proceed with Cephalosporin use.   Evista  [raloxifene]    Other  reaction(s): Joint Pains   Ibandronic Acid    Other reaction(s): Muscle Pain   Remicade [infliximab] Other (See Comments)   Due to bleeding ulcer issue--MD advised her not to take   Risedronate Sodium    Other reaction(s): Joint Pains      Medication List    TAKE these medications   abatacept 250 MG injection Commonly known as: ORENCIA Inject 125 mg into the vein every 30 (thirty) days. Notes to patient: Return to prior schedule   acetaminophen 325 MG tablet Commonly known as: TYLENOL Take 2 tablets (650 mg total) by mouth every 6 (six) hours as needed for mild pain (or Fever >/= 101).   ascorbic acid 1000 MG tablet Commonly known as: VITAMIN C Take 1,000 mg by mouth daily.   CALCIUM PO Take 2,000 mg by mouth daily.   carbidopa-levodopa 50-200 MG tablet Commonly known as: SINEMET CR Take 1 tablet by mouth at bedtime.   carbidopa-levodopa 25-100  MG tablet Commonly known as: SINEMET IR Take 1 tablet by mouth 3 (three) times daily.   ciprofloxacin 500 MG tablet Commonly known as: Cipro Take 1 tablet (500 mg total) by mouth 2 (two) times daily for 5 days.   denosumab 60 MG/ML Sosy injection Commonly known as: PROLIA Inject 60 mg into the skin every 6 (six) months.   donepezil 10 MG tablet Commonly known as: ARICEPT Take 10 mg by mouth daily with supper.   DSS 100 MG Caps TAKE ONE (1) CAPSULE BY MOUTH 2 TIMES DAILY AS NEEDED FOR CONSTIPATION.   DULoxetine 60 MG capsule Commonly known as: CYMBALTA Take 60 mg by mouth daily.   Fish Oil 1000 MG Caps Take 1 capsule (1,000 mg total) by mouth daily.   folic acid 1 MG tablet Commonly known as: FOLVITE Take 1 mg by mouth daily.   furosemide 20 MG tablet Commonly known as: LASIX Take 20 mg by mouth. Pt given med by Dr Jefm Bryant   Melatonin 5 MG Tabs Take 5 mg by mouth at bedtime.   memantine 5 MG tablet Commonly known as: Namenda Take 1 tablet (5 mg total) by mouth 2 (two) times daily.   methotrexate 2.5 MG tablet Take 15 mg by mouth every Wednesday. Notes to patient: Every Wednesday only.   metroNIDAZOLE 500 MG tablet Commonly known as: Flagyl Take 1 tablet (500 mg total) by mouth 3 (three) times daily for 5 days.   multivitamin with minerals Tabs tablet Take 1 tablet by mouth daily.   neomycin-polymyxin-dexamethasone 0.1 % ophthalmic suspension Commonly known as: MAXITROL Place 1 drop into both eyes as needed.   omeprazole 20 MG capsule Commonly known as: PRILOSEC TAKE ONE CAPSULE DAILY.   ondansetron 4 MG tablet Commonly known as: Zofran Take 1 tablet (4 mg total) by mouth every 8 (eight) hours as needed for nausea or vomiting.   polyethylene glycol 17 g packet Commonly known as: MIRALAX / GLYCOLAX Take 17 g by mouth daily.   pregabalin 150 MG capsule Commonly known as: LYRICA Take 1 capsule by mouth 2 (two) times daily.   rOPINIRole 0.25 MG  tablet Commonly known as: REQUIP Take 1 tablet by mouth 4 (four) times daily.   solifenacin 5 MG tablet Commonly known as: VESIcare Take 1 tablet (5 mg total) by mouth daily.   tiZANidine 4 MG tablet Commonly known as: ZANAFLEX Take 1 tablet by mouth 3 (three) times daily as needed.   traMADol 50 MG tablet Commonly known as: ULTRAM Take 50 mg by  mouth every 12 (twelve) hours as needed.   Vitamin D3 50 MCG (2000 UT) Tabs Take 2,000 Units by mouth daily.   vitamin E 400 UNIT capsule Take 400 Units by mouth daily.          Total Time in preparing paper work, data evaluation and todays exam - 35 minutes  Dustin Flock M.D on 06/19/2019 at [place]  [phone]

## 2019-06-19 NOTE — TOC Transition Note (Signed)
Transition of Care [facility]) - CM/SW Discharge Note   Patient Details  Name: Morgan Dennis MRN: [id number] Date of Birth: Dec 14, 1934  [facility] CM/SW Contact:  Lorriann Hansmann, Lenice Llamas Phone Number: [phone]  06/19/2019, 12:00 PM   Clinical Narrative: Clinical Social Worker (CSW) contacted patient's son Morgan Dennis and asked if he had a home health agency preference. Per son he is agreeable to any home health agency that is in network with [facility]. Per [facility] representative they can't accept patient. Per Lauretta Chester home health representative they can accept patient. Patient's son is aware of above and agreeable to [facility]. Per son patient has a walker at home. CSW met with patient and her husband Morgan Dennis and made him aware of above. They are agreeable to [facility] home health and confirmed that patient does have a walker at home. RN aware of above. Please reconsult if future social work needs arise. CSW signing off.     Final next level of care: [place] Barriers to Discharge: Barriers Resolved   Patient Goals and CMS Choice Patient states their goals for this hospitalization and ongoing recovery are:: To feel better. CMS Medicare.gov Compare Post Acute Care list provided to:: Patient Represenative (must comment) Choice offered to / list presented to : Patient, Spouse  Discharge Placement                       Discharge Plan and Services In-house Referral: Clinical Social Workr   Post Acute Care Choice: Home Health          DME Arranged: N/A DME Agency: NA       HH Arranged: PT HH Agency: [facility] Date [place] Agency Contacted: 06/19/19 Time [place]: 23 Representative spoke with at [place]: [place]  Social Determinants of Health ([place]) Interventions     Readmission Risk Interventions No flowsheet data found.

## 2019-06-21 LAB — CULTURE, BLOOD (ROUTINE X 2)
Culture: NO GROWTH
Culture: NO GROWTH

## 2019-06-23 DIAGNOSIS — M069 Rheumatoid arthritis, unspecified: Secondary | ICD-10-CM | POA: Diagnosis not present

## 2019-06-23 DIAGNOSIS — Z79899 Other long term (current) drug therapy: Secondary | ICD-10-CM | POA: Diagnosis not present

## 2019-06-23 DIAGNOSIS — R32 Unspecified urinary incontinence: Secondary | ICD-10-CM | POA: Diagnosis not present

## 2019-06-23 DIAGNOSIS — K5792 Diverticulitis of intestine, part unspecified, without perforation or abscess without bleeding: Secondary | ICD-10-CM | POA: Diagnosis not present

## 2019-06-23 DIAGNOSIS — G20 Parkinson's disease: Secondary | ICD-10-CM | POA: Diagnosis not present

## 2019-06-23 DIAGNOSIS — R69 Illness, unspecified: Secondary | ICD-10-CM | POA: Diagnosis not present

## 2019-06-23 DIAGNOSIS — E785 Hyperlipidemia, unspecified: Secondary | ICD-10-CM | POA: Diagnosis not present

## 2019-06-23 DIAGNOSIS — K219 Gastro-esophageal reflux disease without esophagitis: Secondary | ICD-10-CM | POA: Diagnosis not present

## 2019-06-23 DIAGNOSIS — G2581 Restless legs syndrome: Secondary | ICD-10-CM | POA: Diagnosis not present

## 2019-06-25 ENCOUNTER — Other Ambulatory Visit: Payer: Self-pay

## 2019-06-25 ENCOUNTER — Encounter: Payer: Self-pay | Admitting: Family Medicine

## 2019-06-25 ENCOUNTER — Ambulatory Visit (INDEPENDENT_AMBULATORY_CARE_PROVIDER_SITE_OTHER): Payer: Medicare HMO | Admitting: Family Medicine

## 2019-06-25 VITALS — BP 112/74 | HR 96 | Temp 99.3°F | Resp 16 | Wt 162.2 lb

## 2019-06-25 DIAGNOSIS — R69 Illness, unspecified: Secondary | ICD-10-CM | POA: Diagnosis not present

## 2019-06-25 DIAGNOSIS — F4323 Adjustment disorder with mixed anxiety and depressed mood: Secondary | ICD-10-CM

## 2019-06-25 DIAGNOSIS — M1711 Unilateral primary osteoarthritis, right knee: Secondary | ICD-10-CM | POA: Diagnosis not present

## 2019-06-25 DIAGNOSIS — F0280 Dementia in other diseases classified elsewhere without behavioral disturbance: Secondary | ICD-10-CM

## 2019-06-25 DIAGNOSIS — G301 Alzheimer's disease with late onset: Secondary | ICD-10-CM | POA: Diagnosis not present

## 2019-06-25 DIAGNOSIS — G20 Parkinson's disease: Secondary | ICD-10-CM | POA: Diagnosis not present

## 2019-06-25 NOTE — Patient Instructions (Signed)
Add Metamucil daily.

## 2019-06-25 NOTE — Progress Notes (Signed)
Patient: Morgan Dennis Female    DOB: [birth year]   85 y.o.   MRN: [id number] Visit Date: 06/25/2019  Today's Provider: [name], MD   Chief Complaint  Patient presents with  . Hospitalization Follow-up   Subjective:     HPI  Follow up Hospitalization  Patient was admitted to [facility] ED on 06/16/2019 and discharged on 06/19/2019. She was treated for sepsis due to acture diverticulitis. Treatment for this included prescribing Metrodinazole and Cipro Telephone follow up was done on N/A She reports excellent compliance with treatment. She reports this condition is Improved.  ------------------------------------------------------------------------------------    Allergies  Allergen Reactions  . Penicillins Swelling and Rash    Other reaction(s): Rash Has patient had a PCN reaction causing immediate rash, facial/tongue/throat swelling, SOB or lightheadedness with hypotension: Yes Has patient had a PCN reaction causing severe rash involving mucus membranes or skin necrosis: Unknown Has patient had a PCN reaction that required hospitalization:Yes Has patient had a PCN reaction occurring within the last 10 years: No If all of the above answers are "NO", then may proceed with Cephalosporin use.   . Evista  [Raloxifene]     Other reaction(s): Joint Pains  . Ibandronic Acid     Other reaction(s): Muscle Pain  . Remicade [Infliximab] Other (See Comments)    Due to bleeding ulcer issue--MD advised her not to take  . Risedronate Sodium     Other reaction(s): Joint Pains     Current Outpatient Medications:  .  abatacept (ORENCIA) 250 MG injection, Inject 125 mg into the vein every 30 (thirty) days. , Disp: , Rfl:  .  acetaminophen (TYLENOL) 325 MG tablet, Take 2 tablets (650 mg total) by mouth every 6 (six) hours as needed for mild pain (or Fever >/= 101)., Disp: , Rfl:  .  ascorbic acid (VITAMIN C) 1000 MG tablet, Take 1,000 mg by mouth daily., Disp: , Rfl:  .  CALCIUM  PO, Take 2,000 mg by mouth daily., Disp: , Rfl:  .  carbidopa-levodopa (SINEMET CR) 50-200 MG tablet, Take 1 tablet by mouth at bedtime., Disp: , Rfl:  .  carbidopa-levodopa (SINEMET IR) 25-100 MG tablet, Take 1 tablet by mouth 3 (three) times daily. , Disp: , Rfl:  .  Cholecalciferol (VITAMIN D3) 2000 UNITS TABS, Take 2,000 Units by mouth daily. , Disp: , Rfl:  .  denosumab (PROLIA) 60 MG/ML SOSY injection, Inject 60 mg into the skin every 6 (six) months., Disp: , Rfl:  .  Docusate Sodium (DSS) 100 MG CAPS, TAKE ONE (1) CAPSULE BY MOUTH 2 TIMES DAILY AS NEEDED FOR CONSTIPATION., Disp: , Rfl:  .  donepezil (ARICEPT) 10 MG tablet, Take 10 mg by mouth daily with supper. , Disp: , Rfl:  .  DULoxetine (CYMBALTA) 60 MG capsule, Take 60 mg by mouth daily., Disp: , Rfl:  .  folic acid (FOLVITE) 1 MG tablet, Take 1 mg by mouth daily., Disp: , Rfl:  .  furosemide (LASIX) 20 MG tablet, Take 20 mg by mouth. Pt given med by Dr Jefm Bryant, Disp: , Rfl:  .  Melatonin 5 MG TABS, Take 5 mg by mouth at bedtime. , Disp: , Rfl:  .  memantine (NAMENDA) 5 MG tablet, Take 1 tablet (5 mg total) by mouth 2 (two) times daily., Disp: 180 tablet, Rfl: 3 .  methotrexate 2.5 MG tablet, Take 15 mg by mouth every Wednesday. , Disp: , Rfl:  .  Multiple Vitamin (MULTIVITAMIN WITH  MINERALS) TABS tablet, Take 1 tablet by mouth daily., Disp: , Rfl:  .  neomycin-polymyxin-dexamethasone (MAXITROL) 0.1 % ophthalmic suspension, Place 1 drop into both eyes as needed. , Disp: , Rfl:  .  Omega-3 Fatty Acids (FISH OIL) 1000 MG CAPS, Take 1 capsule (1,000 mg total) by mouth daily. (Patient taking differently: Take 1,000 mg by mouth daily. ), Disp: , Rfl: 0 .  omeprazole (PRILOSEC) 20 MG capsule, TAKE ONE CAPSULE DAILY., Disp: 30 capsule, Rfl: 11 .  ondansetron (ZOFRAN) 4 MG tablet, Take 1 tablet (4 mg total) by mouth every 8 (eight) hours as needed for nausea or vomiting., Disp: 20 tablet, Rfl: 0 .  polyethylene glycol (MIRALAX / GLYCOLAX) 17 g  packet, Take 17 g by mouth daily., Disp: 14 each, Rfl: 0 .  polyethylene glycol powder (GLYCOLAX/MIRALAX) 17 GM/SCOOP powder, , Disp: , Rfl:  .  pregabalin (LYRICA) 150 MG capsule, Take 1 capsule by mouth 2 (two) times daily., Disp: , Rfl:  .  rOPINIRole (REQUIP) 0.25 MG tablet, Take 1 tablet by mouth 4 (four) times daily., Disp: , Rfl:  .  solifenacin (VESICARE) 5 MG tablet, Take 1 tablet (5 mg total) by mouth daily., Disp: 30 tablet, Rfl: 11 .  tiZANidine (ZANAFLEX) 4 MG tablet, Take 1 tablet by mouth 3 (three) times daily as needed., Disp: , Rfl:  .  traMADol (ULTRAM) 50 MG tablet, Take 50 mg by mouth every 12 (twelve) hours as needed. , Disp: , Rfl:  .  vitamin E 400 UNIT capsule, Take 400 Units by mouth daily., Disp: , Rfl:   Review of Systems  Constitutional: Positive for fatigue.  HENT: Positive for congestion (clear).   Eyes: Negative.   Respiratory: Negative.   Cardiovascular: Negative.   Gastrointestinal: Negative.   Endocrine: Negative.   Genitourinary: Negative.   Musculoskeletal: Positive for arthralgias (achy joints).       Right knee pain.  Skin: Negative.   Allergic/Immunologic: Negative.   Neurological: Negative.   Hematological: Negative.   Psychiatric/Behavioral: Negative.     Social History   Tobacco Use  . Smoking status: Never Smoker  . Smokeless tobacco: Never Used  Substance Use Topics  . Alcohol use: No      Objective:   BP 112/74   Pulse 96   Temp 99.3 F (37.4 C) (Oral)   Resp 16   Wt 162 lb 3.2 oz (73.6 kg)   BMI 27.84 kg/m  Vitals:   06/25/19 1527  BP: 112/74  Pulse: 96  Resp: 16  Temp: 99.3 F (37.4 C)  TempSrc: Oral  Weight: 162 lb 3.2 oz (73.6 kg)     Physical Exam Vitals signs reviewed.  Constitutional:      Appearance: She is well-developed.  HENT:     Head: Normocephalic and atraumatic.  Eyes:     General: No scleral icterus.    Conjunctiva/sclera: Conjunctivae normal.  Neck:     Thyroid: No thyromegaly.   Cardiovascular:     Rate and Rhythm: Normal rate and regular rhythm.     Heart sounds: Murmur present.  Pulmonary:     Effort: Pulmonary effort is normal.     Breath sounds: Normal breath sounds.  Abdominal:     Palpations: Abdomen is soft.  Musculoskeletal:     Right lower leg: No edema.     Left lower leg: No edema.  Lymphadenopathy:     Cervical: No cervical adenopathy.  Skin:    General: Skin is warm and dry.  Comments: Multiple SKs.  Neurological:     General: No focal deficit present.     Mental Status: She is alert and oriented to person, place, and time.  Psychiatric:        Mood and Affect: Mood normal.        Behavior: Behavior normal.      No results found for any visits on 06/25/19.     Assessment & Plan    1. Late onset Alzheimer's disease without behavioral disturbance ([place]) Stable per husband. MMSE next visit.  2. Parkinson's disease ([place]) On sinemet.  3. Primary osteoarthritis of right knee May benefit from PT.  4. Adjustment disorder with mixed anxiety and depressed mood Consider stopping Cymbalta on next visit. More than 50% 25 minute visit spent in counseling or coordination of care      [name], MD  [facility] .I,Kathleen J Wolford,acting as a Education administrator for [employer], MD.,have documented all relevant documentation on the behalf of [name], MD,as directed by  [name], MD while in the presence of [name], MD.

## 2019-06-26 DIAGNOSIS — R69 Illness, unspecified: Secondary | ICD-10-CM | POA: Diagnosis not present

## 2019-06-26 DIAGNOSIS — G20 Parkinson's disease: Secondary | ICD-10-CM | POA: Diagnosis not present

## 2019-06-26 DIAGNOSIS — K5792 Diverticulitis of intestine, part unspecified, without perforation or abscess without bleeding: Secondary | ICD-10-CM | POA: Diagnosis not present

## 2019-06-26 DIAGNOSIS — E785 Hyperlipidemia, unspecified: Secondary | ICD-10-CM | POA: Diagnosis not present

## 2019-06-26 DIAGNOSIS — M069 Rheumatoid arthritis, unspecified: Secondary | ICD-10-CM | POA: Diagnosis not present

## 2019-06-26 DIAGNOSIS — R32 Unspecified urinary incontinence: Secondary | ICD-10-CM | POA: Diagnosis not present

## 2019-06-26 DIAGNOSIS — G2581 Restless legs syndrome: Secondary | ICD-10-CM | POA: Diagnosis not present

## 2019-06-26 DIAGNOSIS — Z79899 Other long term (current) drug therapy: Secondary | ICD-10-CM | POA: Diagnosis not present

## 2019-06-26 DIAGNOSIS — K219 Gastro-esophageal reflux disease without esophagitis: Secondary | ICD-10-CM | POA: Diagnosis not present

## 2019-06-30 DIAGNOSIS — K219 Gastro-esophageal reflux disease without esophagitis: Secondary | ICD-10-CM | POA: Diagnosis not present

## 2019-06-30 DIAGNOSIS — K5792 Diverticulitis of intestine, part unspecified, without perforation or abscess without bleeding: Secondary | ICD-10-CM | POA: Diagnosis not present

## 2019-06-30 DIAGNOSIS — E785 Hyperlipidemia, unspecified: Secondary | ICD-10-CM | POA: Diagnosis not present

## 2019-06-30 DIAGNOSIS — M069 Rheumatoid arthritis, unspecified: Secondary | ICD-10-CM | POA: Diagnosis not present

## 2019-06-30 DIAGNOSIS — G20 Parkinson's disease: Secondary | ICD-10-CM | POA: Diagnosis not present

## 2019-06-30 DIAGNOSIS — R32 Unspecified urinary incontinence: Secondary | ICD-10-CM | POA: Diagnosis not present

## 2019-06-30 DIAGNOSIS — Z79899 Other long term (current) drug therapy: Secondary | ICD-10-CM | POA: Diagnosis not present

## 2019-06-30 DIAGNOSIS — G2581 Restless legs syndrome: Secondary | ICD-10-CM | POA: Diagnosis not present

## 2019-06-30 DIAGNOSIS — R69 Illness, unspecified: Secondary | ICD-10-CM | POA: Diagnosis not present

## 2019-07-01 DIAGNOSIS — G2581 Restless legs syndrome: Secondary | ICD-10-CM | POA: Diagnosis not present

## 2019-07-01 DIAGNOSIS — E785 Hyperlipidemia, unspecified: Secondary | ICD-10-CM | POA: Diagnosis not present

## 2019-07-01 DIAGNOSIS — G20 Parkinson's disease: Secondary | ICD-10-CM | POA: Diagnosis not present

## 2019-07-01 DIAGNOSIS — M069 Rheumatoid arthritis, unspecified: Secondary | ICD-10-CM | POA: Diagnosis not present

## 2019-07-01 DIAGNOSIS — R69 Illness, unspecified: Secondary | ICD-10-CM | POA: Diagnosis not present

## 2019-07-01 DIAGNOSIS — Z79899 Other long term (current) drug therapy: Secondary | ICD-10-CM | POA: Diagnosis not present

## 2019-07-01 DIAGNOSIS — K5792 Diverticulitis of intestine, part unspecified, without perforation or abscess without bleeding: Secondary | ICD-10-CM | POA: Diagnosis not present

## 2019-07-01 DIAGNOSIS — R32 Unspecified urinary incontinence: Secondary | ICD-10-CM | POA: Diagnosis not present

## 2019-07-01 DIAGNOSIS — K219 Gastro-esophageal reflux disease without esophagitis: Secondary | ICD-10-CM | POA: Diagnosis not present

## 2019-07-02 DIAGNOSIS — K219 Gastro-esophageal reflux disease without esophagitis: Secondary | ICD-10-CM | POA: Diagnosis not present

## 2019-07-02 DIAGNOSIS — E785 Hyperlipidemia, unspecified: Secondary | ICD-10-CM | POA: Diagnosis not present

## 2019-07-02 DIAGNOSIS — M069 Rheumatoid arthritis, unspecified: Secondary | ICD-10-CM | POA: Diagnosis not present

## 2019-07-02 DIAGNOSIS — K5792 Diverticulitis of intestine, part unspecified, without perforation or abscess without bleeding: Secondary | ICD-10-CM | POA: Diagnosis not present

## 2019-07-02 DIAGNOSIS — G2581 Restless legs syndrome: Secondary | ICD-10-CM | POA: Diagnosis not present

## 2019-07-02 DIAGNOSIS — R69 Illness, unspecified: Secondary | ICD-10-CM | POA: Diagnosis not present

## 2019-07-02 DIAGNOSIS — G20 Parkinson's disease: Secondary | ICD-10-CM | POA: Diagnosis not present

## 2019-07-02 DIAGNOSIS — Z79899 Other long term (current) drug therapy: Secondary | ICD-10-CM | POA: Diagnosis not present

## 2019-07-02 DIAGNOSIS — R32 Unspecified urinary incontinence: Secondary | ICD-10-CM | POA: Diagnosis not present

## 2019-07-03 ENCOUNTER — Telehealth: Payer: Self-pay

## 2019-07-03 DIAGNOSIS — R32 Unspecified urinary incontinence: Secondary | ICD-10-CM | POA: Diagnosis not present

## 2019-07-03 DIAGNOSIS — R69 Illness, unspecified: Secondary | ICD-10-CM | POA: Diagnosis not present

## 2019-07-03 DIAGNOSIS — K5792 Diverticulitis of intestine, part unspecified, without perforation or abscess without bleeding: Secondary | ICD-10-CM | POA: Diagnosis not present

## 2019-07-03 DIAGNOSIS — G2581 Restless legs syndrome: Secondary | ICD-10-CM | POA: Diagnosis not present

## 2019-07-03 DIAGNOSIS — G20 Parkinson's disease: Secondary | ICD-10-CM | POA: Diagnosis not present

## 2019-07-03 DIAGNOSIS — K219 Gastro-esophageal reflux disease without esophagitis: Secondary | ICD-10-CM | POA: Diagnosis not present

## 2019-07-03 DIAGNOSIS — Z79899 Other long term (current) drug therapy: Secondary | ICD-10-CM | POA: Diagnosis not present

## 2019-07-03 DIAGNOSIS — M069 Rheumatoid arthritis, unspecified: Secondary | ICD-10-CM | POA: Diagnosis not present

## 2019-07-03 DIAGNOSIS — E785 Hyperlipidemia, unspecified: Secondary | ICD-10-CM | POA: Diagnosis not present

## 2019-07-03 NOTE — Telephone Encounter (Signed)
L/M advising [name] as below.  

## 2019-07-03 NOTE — Telephone Encounter (Signed)
[name] @ [facility]. Is requesting occupational therapy therapy twice a week for three weeks. [phone]

## 2019-07-07 DIAGNOSIS — M0589 Other rheumatoid arthritis with rheumatoid factor of multiple sites: Secondary | ICD-10-CM | POA: Diagnosis not present

## 2019-07-07 DIAGNOSIS — M0579 Rheumatoid arthritis with rheumatoid factor of multiple sites without organ or systems involvement: Secondary | ICD-10-CM | POA: Diagnosis not present

## 2019-07-07 DIAGNOSIS — M79605 Pain in left leg: Secondary | ICD-10-CM | POA: Diagnosis not present

## 2019-07-07 DIAGNOSIS — M79604 Pain in right leg: Secondary | ICD-10-CM | POA: Diagnosis not present

## 2019-07-08 DIAGNOSIS — E785 Hyperlipidemia, unspecified: Secondary | ICD-10-CM | POA: Diagnosis not present

## 2019-07-08 DIAGNOSIS — G20 Parkinson's disease: Secondary | ICD-10-CM | POA: Diagnosis not present

## 2019-07-08 DIAGNOSIS — G2581 Restless legs syndrome: Secondary | ICD-10-CM | POA: Diagnosis not present

## 2019-07-08 DIAGNOSIS — K219 Gastro-esophageal reflux disease without esophagitis: Secondary | ICD-10-CM | POA: Diagnosis not present

## 2019-07-08 DIAGNOSIS — R69 Illness, unspecified: Secondary | ICD-10-CM | POA: Diagnosis not present

## 2019-07-08 DIAGNOSIS — M069 Rheumatoid arthritis, unspecified: Secondary | ICD-10-CM | POA: Diagnosis not present

## 2019-07-08 DIAGNOSIS — K5792 Diverticulitis of intestine, part unspecified, without perforation or abscess without bleeding: Secondary | ICD-10-CM | POA: Diagnosis not present

## 2019-07-08 DIAGNOSIS — R32 Unspecified urinary incontinence: Secondary | ICD-10-CM | POA: Diagnosis not present

## 2019-07-08 DIAGNOSIS — Z79899 Other long term (current) drug therapy: Secondary | ICD-10-CM | POA: Diagnosis not present

## 2019-07-10 DIAGNOSIS — K219 Gastro-esophageal reflux disease without esophagitis: Secondary | ICD-10-CM | POA: Diagnosis not present

## 2019-07-10 DIAGNOSIS — G20 Parkinson's disease: Secondary | ICD-10-CM | POA: Diagnosis not present

## 2019-07-10 DIAGNOSIS — R32 Unspecified urinary incontinence: Secondary | ICD-10-CM | POA: Diagnosis not present

## 2019-07-10 DIAGNOSIS — G2581 Restless legs syndrome: Secondary | ICD-10-CM | POA: Diagnosis not present

## 2019-07-10 DIAGNOSIS — M069 Rheumatoid arthritis, unspecified: Secondary | ICD-10-CM | POA: Diagnosis not present

## 2019-07-10 DIAGNOSIS — K5792 Diverticulitis of intestine, part unspecified, without perforation or abscess without bleeding: Secondary | ICD-10-CM | POA: Diagnosis not present

## 2019-07-10 DIAGNOSIS — E785 Hyperlipidemia, unspecified: Secondary | ICD-10-CM | POA: Diagnosis not present

## 2019-07-10 DIAGNOSIS — R69 Illness, unspecified: Secondary | ICD-10-CM | POA: Diagnosis not present

## 2019-07-10 DIAGNOSIS — Z79899 Other long term (current) drug therapy: Secondary | ICD-10-CM | POA: Diagnosis not present

## 2019-07-11 DIAGNOSIS — E785 Hyperlipidemia, unspecified: Secondary | ICD-10-CM | POA: Diagnosis not present

## 2019-07-11 DIAGNOSIS — Z79899 Other long term (current) drug therapy: Secondary | ICD-10-CM | POA: Diagnosis not present

## 2019-07-11 DIAGNOSIS — M069 Rheumatoid arthritis, unspecified: Secondary | ICD-10-CM | POA: Diagnosis not present

## 2019-07-11 DIAGNOSIS — K5792 Diverticulitis of intestine, part unspecified, without perforation or abscess without bleeding: Secondary | ICD-10-CM | POA: Diagnosis not present

## 2019-07-11 DIAGNOSIS — R32 Unspecified urinary incontinence: Secondary | ICD-10-CM | POA: Diagnosis not present

## 2019-07-11 DIAGNOSIS — G2581 Restless legs syndrome: Secondary | ICD-10-CM | POA: Diagnosis not present

## 2019-07-11 DIAGNOSIS — G20 Parkinson's disease: Secondary | ICD-10-CM | POA: Diagnosis not present

## 2019-07-11 DIAGNOSIS — K219 Gastro-esophageal reflux disease without esophagitis: Secondary | ICD-10-CM | POA: Diagnosis not present

## 2019-07-11 DIAGNOSIS — R69 Illness, unspecified: Secondary | ICD-10-CM | POA: Diagnosis not present

## 2019-07-14 DIAGNOSIS — R32 Unspecified urinary incontinence: Secondary | ICD-10-CM | POA: Diagnosis not present

## 2019-07-14 DIAGNOSIS — K219 Gastro-esophageal reflux disease without esophagitis: Secondary | ICD-10-CM | POA: Diagnosis not present

## 2019-07-14 DIAGNOSIS — G20 Parkinson's disease: Secondary | ICD-10-CM | POA: Diagnosis not present

## 2019-07-14 DIAGNOSIS — E785 Hyperlipidemia, unspecified: Secondary | ICD-10-CM | POA: Diagnosis not present

## 2019-07-14 DIAGNOSIS — G2581 Restless legs syndrome: Secondary | ICD-10-CM | POA: Diagnosis not present

## 2019-07-14 DIAGNOSIS — Z79899 Other long term (current) drug therapy: Secondary | ICD-10-CM | POA: Diagnosis not present

## 2019-07-14 DIAGNOSIS — M069 Rheumatoid arthritis, unspecified: Secondary | ICD-10-CM | POA: Diagnosis not present

## 2019-07-14 DIAGNOSIS — R69 Illness, unspecified: Secondary | ICD-10-CM | POA: Diagnosis not present

## 2019-07-14 DIAGNOSIS — K5792 Diverticulitis of intestine, part unspecified, without perforation or abscess without bleeding: Secondary | ICD-10-CM | POA: Diagnosis not present

## 2019-07-15 DIAGNOSIS — K5792 Diverticulitis of intestine, part unspecified, without perforation or abscess without bleeding: Secondary | ICD-10-CM | POA: Diagnosis not present

## 2019-07-15 DIAGNOSIS — R32 Unspecified urinary incontinence: Secondary | ICD-10-CM | POA: Diagnosis not present

## 2019-07-15 DIAGNOSIS — E785 Hyperlipidemia, unspecified: Secondary | ICD-10-CM | POA: Diagnosis not present

## 2019-07-15 DIAGNOSIS — Z79899 Other long term (current) drug therapy: Secondary | ICD-10-CM | POA: Diagnosis not present

## 2019-07-15 DIAGNOSIS — G2581 Restless legs syndrome: Secondary | ICD-10-CM | POA: Diagnosis not present

## 2019-07-15 DIAGNOSIS — K219 Gastro-esophageal reflux disease without esophagitis: Secondary | ICD-10-CM | POA: Diagnosis not present

## 2019-07-15 DIAGNOSIS — M069 Rheumatoid arthritis, unspecified: Secondary | ICD-10-CM | POA: Diagnosis not present

## 2019-07-15 DIAGNOSIS — G20 Parkinson's disease: Secondary | ICD-10-CM | POA: Diagnosis not present

## 2019-07-15 DIAGNOSIS — R69 Illness, unspecified: Secondary | ICD-10-CM | POA: Diagnosis not present

## 2019-07-18 DIAGNOSIS — K219 Gastro-esophageal reflux disease without esophagitis: Secondary | ICD-10-CM | POA: Diagnosis not present

## 2019-07-18 DIAGNOSIS — Z79899 Other long term (current) drug therapy: Secondary | ICD-10-CM | POA: Diagnosis not present

## 2019-07-18 DIAGNOSIS — G2581 Restless legs syndrome: Secondary | ICD-10-CM | POA: Diagnosis not present

## 2019-07-18 DIAGNOSIS — E785 Hyperlipidemia, unspecified: Secondary | ICD-10-CM | POA: Diagnosis not present

## 2019-07-18 DIAGNOSIS — K5792 Diverticulitis of intestine, part unspecified, without perforation or abscess without bleeding: Secondary | ICD-10-CM | POA: Diagnosis not present

## 2019-07-18 DIAGNOSIS — G20 Parkinson's disease: Secondary | ICD-10-CM | POA: Diagnosis not present

## 2019-07-18 DIAGNOSIS — M069 Rheumatoid arthritis, unspecified: Secondary | ICD-10-CM | POA: Diagnosis not present

## 2019-07-18 DIAGNOSIS — R69 Illness, unspecified: Secondary | ICD-10-CM | POA: Diagnosis not present

## 2019-07-18 DIAGNOSIS — R32 Unspecified urinary incontinence: Secondary | ICD-10-CM | POA: Diagnosis not present

## 2019-07-21 ENCOUNTER — Other Ambulatory Visit: Payer: Self-pay | Admitting: Family Medicine

## 2019-07-24 DIAGNOSIS — E785 Hyperlipidemia, unspecified: Secondary | ICD-10-CM | POA: Diagnosis not present

## 2019-07-24 DIAGNOSIS — K5792 Diverticulitis of intestine, part unspecified, without perforation or abscess without bleeding: Secondary | ICD-10-CM | POA: Diagnosis not present

## 2019-07-24 DIAGNOSIS — G2581 Restless legs syndrome: Secondary | ICD-10-CM | POA: Diagnosis not present

## 2019-07-24 DIAGNOSIS — G20 Parkinson's disease: Secondary | ICD-10-CM | POA: Diagnosis not present

## 2019-07-24 DIAGNOSIS — M069 Rheumatoid arthritis, unspecified: Secondary | ICD-10-CM | POA: Diagnosis not present

## 2019-07-24 DIAGNOSIS — Z79899 Other long term (current) drug therapy: Secondary | ICD-10-CM | POA: Diagnosis not present

## 2019-07-24 DIAGNOSIS — K219 Gastro-esophageal reflux disease without esophagitis: Secondary | ICD-10-CM | POA: Diagnosis not present

## 2019-07-24 DIAGNOSIS — R32 Unspecified urinary incontinence: Secondary | ICD-10-CM | POA: Diagnosis not present

## 2019-07-24 DIAGNOSIS — R69 Illness, unspecified: Secondary | ICD-10-CM | POA: Diagnosis not present

## 2019-07-30 ENCOUNTER — Ambulatory Visit: Payer: Self-pay | Admitting: Family Medicine

## 2019-08-04 DIAGNOSIS — M0589 Other rheumatoid arthritis with rheumatoid factor of multiple sites: Secondary | ICD-10-CM | POA: Diagnosis not present

## 2019-08-04 DIAGNOSIS — M0579 Rheumatoid arthritis with rheumatoid factor of multiple sites without organ or systems involvement: Secondary | ICD-10-CM | POA: Diagnosis not present

## 2019-09-01 DIAGNOSIS — M0579 Rheumatoid arthritis with rheumatoid factor of multiple sites without organ or systems involvement: Secondary | ICD-10-CM | POA: Diagnosis not present

## 2019-09-04 ENCOUNTER — Encounter: Payer: Medicare HMO | Admitting: Family Medicine

## 2019-09-17 DIAGNOSIS — G20 Parkinson's disease: Secondary | ICD-10-CM | POA: Diagnosis not present

## 2019-09-17 DIAGNOSIS — R69 Illness, unspecified: Secondary | ICD-10-CM | POA: Diagnosis not present

## 2019-09-17 DIAGNOSIS — G2581 Restless legs syndrome: Secondary | ICD-10-CM | POA: Diagnosis not present

## 2019-09-17 DIAGNOSIS — G4752 REM sleep behavior disorder: Secondary | ICD-10-CM | POA: Diagnosis not present

## 2019-09-17 DIAGNOSIS — G608 Other hereditary and idiopathic neuropathies: Secondary | ICD-10-CM | POA: Diagnosis not present

## 2019-09-17 DIAGNOSIS — G301 Alzheimer's disease with late onset: Secondary | ICD-10-CM | POA: Diagnosis not present

## 2019-09-29 DIAGNOSIS — M0589 Other rheumatoid arthritis with rheumatoid factor of multiple sites: Secondary | ICD-10-CM | POA: Diagnosis not present

## 2019-09-29 DIAGNOSIS — M0579 Rheumatoid arthritis with rheumatoid factor of multiple sites without organ or systems involvement: Secondary | ICD-10-CM | POA: Diagnosis not present
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
## 2019-10-09 ENCOUNTER — Ambulatory Visit (INDEPENDENT_AMBULATORY_CARE_PROVIDER_SITE_OTHER): Payer: Medicare HMO | Admitting: Family Medicine

## 2019-10-09 ENCOUNTER — Encounter: Payer: Self-pay | Admitting: Family Medicine

## 2019-10-09 ENCOUNTER — Other Ambulatory Visit: Payer: Self-pay

## 2019-10-09 VITALS — BP 107/70 | HR 87 | Temp 97.3°F | Resp 18 | Ht 62.0 in | Wt 168.0 lb

## 2019-10-09 DIAGNOSIS — E782 Mixed hyperlipidemia: Secondary | ICD-10-CM | POA: Diagnosis not present

## 2019-10-09 DIAGNOSIS — F09 Unspecified mental disorder due to known physiological condition: Secondary | ICD-10-CM | POA: Diagnosis not present

## 2019-10-09 DIAGNOSIS — Z0000 Encounter for general adult medical examination without abnormal findings: Secondary | ICD-10-CM | POA: Diagnosis not present

## 2019-10-09 DIAGNOSIS — M5431 Sciatica, right side: Secondary | ICD-10-CM

## 2019-10-09 DIAGNOSIS — G20 Parkinson's disease: Secondary | ICD-10-CM

## 2019-10-09 DIAGNOSIS — R69 Illness, unspecified: Secondary | ICD-10-CM | POA: Diagnosis not present

## 2019-10-09 DIAGNOSIS — F0390 Unspecified dementia without behavioral disturbance: Secondary | ICD-10-CM

## 2019-10-09 MED ORDER — GABAPENTIN 600 MG PO TABS: 600.0000 mg | ORAL_TABLET | Freq: Two times a day (BID) | ORAL | 11 refills | Status: DC

## 2019-10-10 LAB — CBC WITH DIFFERENTIAL/PLATELET
Basophils Absolute: 0.1 10*3/uL (ref 0.0–0.2)
Basos: 1 %
EOS (ABSOLUTE): 0.3 10*3/uL (ref 0.0–0.4)
Eos: 4 %
Hematocrit: 40.9 % (ref 34.0–46.6)
Hemoglobin: 13.7 g/dL (ref 11.1–15.9)
Immature Grans (Abs): 0 10*3/uL (ref 0.0–0.1)
Immature Granulocytes: 0 %
Lymphocytes Absolute: 2.3 10*3/uL (ref 0.7–3.1)
Lymphs: 32 %
MCH: 32.2 pg (ref 26.6–33.0)
MCHC: 33.5 g/dL (ref 31.5–35.7)
MCV: 96 fL (ref 79–97)
Monocytes Absolute: 0.9 10*3/uL (ref 0.1–0.9)
Monocytes: 13 %
Neutrophils Absolute: 3.6 10*3/uL (ref 1.4–7.0)
Neutrophils: 50 %
Platelets: 169 10*3/uL (ref 150–450)
RBC: 4.25 x10E6/uL (ref 3.77–5.28)
RDW: 12.7 % (ref 11.7–15.4)
WBC: 7.1 10*3/uL (ref 3.4–10.8)

## 2019-10-10 LAB — COMPREHENSIVE METABOLIC PANEL
ALT: 14 IU/L (ref 0–32)
AST: 26 IU/L (ref 0–40)
Albumin/Globulin Ratio: 1.6 (ref 1.2–2.2)
Albumin: 4.2 g/dL (ref 3.6–4.6)
Alkaline Phosphatase: 74 IU/L (ref 39–117)
BUN/Creatinine Ratio: 25 (ref 12–28)
BUN: 19 mg/dL (ref 8–27)
Bilirubin Total: 0.4 mg/dL (ref 0.0–1.2)
CO2: 27 mmol/L (ref 20–29)
Calcium: 9.5 mg/dL (ref 8.7–10.3)
Chloride: 102 mmol/L (ref 96–106)
Creatinine, Ser: 0.75 mg/dL (ref 0.57–1.00)
GFR calc Af Amer: 86 mL/min/{1.73_m2} (ref >59)
GFR calc non Af Amer: 74 mL/min/{1.73_m2} (ref >59)
Globulin, Total: 2.7 g/dL (ref 1.5–4.5)
Glucose: 86 mg/dL (ref 65–99)
Potassium: 4.3 mmol/L (ref 3.5–5.2)
Sodium: 141 mmol/L (ref 134–144)
Total Protein: 6.9 g/dL (ref 6.0–8.5)

## 2019-10-10 LAB — TSH: TSH: 3.15 u[IU]/mL (ref 0.450–4.500)

## 2019-10-17 ENCOUNTER — Inpatient Hospital Stay
Admission: EM | Admit: 2019-10-17 | Discharge: 2019-10-20 | DRG: 193 | Disposition: A | Discharge status: Home Health | Payer: Medicare HMO | Attending: Family Medicine | Admitting: Family Medicine

## 2019-10-17 ENCOUNTER — Emergency Department: Payer: Medicare HMO

## 2019-10-17 ENCOUNTER — Other Ambulatory Visit: Payer: Self-pay

## 2019-10-17 ENCOUNTER — Inpatient Hospital Stay: Payer: Medicare HMO

## 2019-10-17 DIAGNOSIS — J9601 Acute respiratory failure with hypoxia: Secondary | ICD-10-CM | POA: Diagnosis not present

## 2019-10-17 DIAGNOSIS — G20 Parkinson's disease: Secondary | ICD-10-CM | POA: Diagnosis not present

## 2019-10-17 DIAGNOSIS — Z880 Allergy status to penicillin: Secondary | ICD-10-CM

## 2019-10-17 DIAGNOSIS — Z974 Presence of external hearing-aid: Secondary | ICD-10-CM | POA: Diagnosis not present

## 2019-10-17 DIAGNOSIS — G2581 Restless legs syndrome: Secondary | ICD-10-CM | POA: Diagnosis present

## 2019-10-17 DIAGNOSIS — Z8249 Family history of ischemic heart disease and other diseases of the circulatory system: Secondary | ICD-10-CM | POA: Diagnosis not present

## 2019-10-17 DIAGNOSIS — Z96659 Presence of unspecified artificial knee joint: Secondary | ICD-10-CM | POA: Diagnosis present

## 2019-10-17 DIAGNOSIS — G9340 Encephalopathy, unspecified: Secondary | ICD-10-CM | POA: Diagnosis not present

## 2019-10-17 DIAGNOSIS — G20A1 Parkinson's disease without dyskinesia, without mention of fluctuations: Secondary | ICD-10-CM | POA: Diagnosis present

## 2019-10-17 DIAGNOSIS — D696 Thrombocytopenia, unspecified: Secondary | ICD-10-CM | POA: Diagnosis not present

## 2019-10-17 DIAGNOSIS — F0280 Dementia in other diseases classified elsewhere without behavioral disturbance: Secondary | ICD-10-CM | POA: Diagnosis present

## 2019-10-17 DIAGNOSIS — R0902 Hypoxemia: Secondary | ICD-10-CM

## 2019-10-17 DIAGNOSIS — K219 Gastro-esophageal reflux disease without esophagitis: Secondary | ICD-10-CM | POA: Diagnosis present

## 2019-10-17 DIAGNOSIS — J9621 Acute and chronic respiratory failure with hypoxia: Secondary | ICD-10-CM

## 2019-10-17 DIAGNOSIS — Z20828 Contact with and (suspected) exposure to other viral communicable diseases: Secondary | ICD-10-CM | POA: Diagnosis not present

## 2019-10-17 DIAGNOSIS — R0789 Other chest pain: Secondary | ICD-10-CM | POA: Diagnosis not present

## 2019-10-17 DIAGNOSIS — I700 Atherosclerosis of aorta: Secondary | ICD-10-CM | POA: Diagnosis present

## 2019-10-17 DIAGNOSIS — J189 Pneumonia, unspecified organism: Secondary | ICD-10-CM | POA: Diagnosis present

## 2019-10-17 DIAGNOSIS — M069 Rheumatoid arthritis, unspecified: Secondary | ICD-10-CM | POA: Diagnosis present

## 2019-10-17 DIAGNOSIS — H9192 Unspecified hearing loss, left ear: Secondary | ICD-10-CM | POA: Diagnosis present

## 2019-10-17 DIAGNOSIS — F0390 Unspecified dementia without behavioral disturbance: Secondary | ICD-10-CM

## 2019-10-17 DIAGNOSIS — Z66 Do not resuscitate: Secondary | ICD-10-CM | POA: Diagnosis present

## 2019-10-17 DIAGNOSIS — I10 Essential (primary) hypertension: Secondary | ICD-10-CM | POA: Diagnosis not present

## 2019-10-17 DIAGNOSIS — R0781 Pleurodynia: Secondary | ICD-10-CM | POA: Diagnosis not present

## 2019-10-17 DIAGNOSIS — E785 Hyperlipidemia, unspecified: Secondary | ICD-10-CM | POA: Diagnosis not present

## 2019-10-17 DIAGNOSIS — D259 Leiomyoma of uterus, unspecified: Secondary | ICD-10-CM | POA: Diagnosis not present

## 2019-10-17 DIAGNOSIS — K5730 Diverticulosis of large intestine without perforation or abscess without bleeding: Secondary | ICD-10-CM | POA: Diagnosis not present

## 2019-10-17 DIAGNOSIS — R269 Unspecified abnormalities of gait and mobility: Secondary | ICD-10-CM | POA: Diagnosis present

## 2019-10-17 DIAGNOSIS — R079 Chest pain, unspecified: Secondary | ICD-10-CM | POA: Diagnosis not present

## 2019-10-17 DIAGNOSIS — R69 Illness, unspecified: Secondary | ICD-10-CM | POA: Diagnosis not present

## 2019-10-17 DIAGNOSIS — K429 Umbilical hernia without obstruction or gangrene: Secondary | ICD-10-CM | POA: Diagnosis not present

## 2019-10-17 LAB — CBC WITH DIFFERENTIAL/PLATELET
Abs Immature Granulocytes: 0.05 10*3/uL (ref 0.00–0.07)
Basophils Absolute: 0 10*3/uL (ref 0.0–0.1)
Basophils Relative: 0 %
Eosinophils Absolute: 0.2 10*3/uL (ref 0.0–0.5)
Eosinophils Relative: 2 %
HCT: 41.9 % (ref 36.0–46.0)
Hemoglobin: 13.7 g/dL (ref 12.0–15.0)
Immature Granulocytes: 0 %
Lymphocytes Relative: 13 %
Lymphs Abs: 1.7 10*3/uL (ref 0.7–4.0)
MCH: 31.6 pg (ref 26.0–34.0)
MCHC: 32.7 g/dL (ref 30.0–36.0)
MCV: 96.8 fL (ref 80.0–100.0)
Monocytes Absolute: 1.5 10*3/uL — ABNORMAL HIGH (ref 0.1–1.0)
Monocytes Relative: 11 %
Neutro Abs: 9.6 10*3/uL — ABNORMAL HIGH (ref 1.7–7.7)
Neutrophils Relative %: 74 %
Platelets: 153 10*3/uL (ref 150–400)
RBC: 4.33 MIL/uL (ref 3.87–5.11)
RDW: 12.7 % (ref 11.5–15.5)
WBC: 13.1 10*3/uL — ABNORMAL HIGH (ref 4.0–10.5)
nRBC: 0 % (ref 0.0–0.2)

## 2019-10-17 LAB — HEPATIC FUNCTION PANEL
ALT: 8 U/L (ref 0–44)
AST: 34 U/L (ref 15–41)
Albumin: 3.9 g/dL (ref 3.5–5.0)
Alkaline Phosphatase: 69 U/L (ref 38–126)
Bilirubin, Direct: 0.1 mg/dL (ref 0.0–0.2)
Indirect Bilirubin: 0.5 mg/dL (ref 0.3–0.9)
Total Bilirubin: 0.6 mg/dL (ref 0.3–1.2)
Total Protein: 7.1 g/dL (ref 6.5–8.1)

## 2019-10-17 LAB — FIBRIN DERIVATIVES D-DIMER (ARMC ONLY): Fibrin derivatives D-dimer (ARMC): 480.8 ng/mL (FEU) (ref 0.00–499.00)

## 2019-10-17 LAB — BASIC METABOLIC PANEL
Anion gap: 10 (ref 5–15)
BUN: 18 mg/dL (ref 8–23)
CO2: 29 mmol/L (ref 22–32)
Calcium: 9 mg/dL (ref 8.9–10.3)
Chloride: 98 mmol/L (ref 98–111)
Creatinine, Ser: 0.69 mg/dL (ref 0.44–1.00)
GFR calc Af Amer: >60 mL/min (ref >60)
GFR calc non Af Amer: >60 mL/min (ref >60)
Glucose, Bld: 143 mg/dL — ABNORMAL HIGH (ref 70–99)
Potassium: 4.3 mmol/L (ref 3.5–5.1)
Sodium: 137 mmol/L (ref 135–145)

## 2019-10-17 LAB — SARS CORONAVIRUS 2 (TAT 6-24 HRS): SARS Coronavirus 2: NEGATIVE

## 2019-10-17 LAB — LIPASE, BLOOD: Lipase: 21 U/L (ref 11–51)

## 2019-10-17 LAB — GLUCOSE, CAPILLARY: Glucose-Capillary: 129 mg/dL — ABNORMAL HIGH (ref 70–99)

## 2019-10-17 LAB — TROPONIN I (HIGH SENSITIVITY)
Troponin I (High Sensitivity): 16 ng/L (ref <18)
Troponin I (High Sensitivity): 16 ng/L (ref <18)
Troponin I (High Sensitivity): 16 ng/L (ref <18)

## 2019-10-17 MED ORDER — GABAPENTIN 600 MG PO TABS: 600.0000 mg | ORAL_TABLET | Freq: Two times a day (BID) | ORAL | Status: DC

## 2019-10-17 MED ORDER — ACETAMINOPHEN 325 MG PO TABS: 650.0000 mg | ORAL_TABLET | Freq: Four times a day (QID) | ORAL | Status: DC | PRN

## 2019-10-17 MED ORDER — CARBIDOPA-LEVODOPA ER 50-200 MG PO TBCR
1.0000 | EXTENDED_RELEASE_TABLET | Freq: Every day | ORAL | Status: DC
  Administered 2019-10-17 – 2019-10-19 (×3): 1 via ORAL
  Filled 2019-10-17 (×5): qty 1

## 2019-10-17 MED ORDER — MORPHINE SULFATE (PF) 2 MG/ML IV SOLN
2.0000 mg | Freq: Once | INTRAVENOUS | Status: AC
  Administered 2019-10-17: 2 mg via INTRAVENOUS
  Filled 2019-10-17: qty 1

## 2019-10-17 MED ORDER — SODIUM CHLORIDE 0.9 % IV SOLN
INTRAVENOUS | Status: DC
  Administered 2019-10-17 – 2019-10-19 (×4): via INTRAVENOUS

## 2019-10-17 MED ORDER — LEVOFLOXACIN IN D5W 750 MG/150ML IV SOLN
750.0000 mg | Freq: Once | INTRAVENOUS | Status: AC
  Administered 2019-10-17: 750 mg via INTRAVENOUS
  Filled 2019-10-17: qty 150

## 2019-10-17 MED ORDER — PREGABALIN 75 MG PO CAPS
150.0000 mg | ORAL_CAPSULE | Freq: Two times a day (BID) | ORAL | Status: DC
  Administered 2019-10-17 – 2019-10-20 (×6): 150 mg via ORAL
  Filled 2019-10-17 (×7): qty 2

## 2019-10-17 MED ORDER — ADULT MULTIVITAMIN W/MINERALS CH
1.0000 | ORAL_TABLET | Freq: Every day | ORAL | Status: DC
  Administered 2019-10-18 – 2019-10-20 (×3): 1 via ORAL
  Filled 2019-10-17 (×4): qty 1

## 2019-10-17 MED ORDER — DOCUSATE SODIUM 100 MG PO CAPS
100.0000 mg | ORAL_CAPSULE | Freq: Every day | ORAL | Status: DC | PRN
  Administered 2019-10-18: 100 mg via ORAL
  Filled 2019-10-17: qty 1

## 2019-10-17 MED ORDER — ENOXAPARIN SODIUM 40 MG/0.4ML ~~LOC~~ SOLN
40.0000 mg | SUBCUTANEOUS | Status: DC
  Administered 2019-10-17 – 2019-10-19 (×3): 40 mg via SUBCUTANEOUS
  Filled 2019-10-17 (×4): qty 0.4

## 2019-10-17 MED ORDER — PANTOPRAZOLE SODIUM 40 MG PO TBEC
40.0000 mg | DELAYED_RELEASE_TABLET | Freq: Every day | ORAL | Status: DC
  Administered 2019-10-18 – 2019-10-20 (×3): 40 mg via ORAL
  Filled 2019-10-17 (×4): qty 1

## 2019-10-17 MED ORDER — IOHEXOL 350 MG/ML SOLN
75.0000 mL | Freq: Once | INTRAVENOUS | Status: AC | PRN
  Administered 2019-10-17: 75 mL via INTRAVENOUS

## 2019-10-17 MED ORDER — FOLIC ACID 1 MG PO TABS
1.0000 mg | ORAL_TABLET | Freq: Every day | ORAL | Status: DC
  Administered 2019-10-18 – 2019-10-20 (×3): 1 mg via ORAL
  Filled 2019-10-17 (×4): qty 1

## 2019-10-17 MED ORDER — DULOXETINE HCL 30 MG PO CPEP
60.0000 mg | ORAL_CAPSULE | Freq: Every day | ORAL | Status: DC
  Administered 2019-10-17 – 2019-10-19 (×3): 60 mg via ORAL
  Filled 2019-10-17: qty 2
  Filled 2019-10-17: qty 1
  Filled 2019-10-17 (×2): qty 2

## 2019-10-17 MED ORDER — DARIFENACIN HYDROBROMIDE ER 7.5 MG PO TB24
7.5000 mg | ORAL_TABLET | Freq: Every day | ORAL | Status: DC
  Administered 2019-10-18 – 2019-10-20 (×3): 7.5 mg via ORAL
  Filled 2019-10-17 (×3): qty 1

## 2019-10-17 MED ORDER — CARBIDOPA-LEVODOPA 25-100 MG PO TABS
1.5000 | ORAL_TABLET | Freq: Three times a day (TID) | ORAL | Status: DC
  Administered 2019-10-17 – 2019-10-20 (×9): 1.5 via ORAL
  Filled 2019-10-17 (×12): qty 1.5

## 2019-10-17 MED ORDER — CALCIUM 150 MG PO TABS: 2000.0000 mg | ORAL_TABLET | Freq: Every day | ORAL | Status: DC

## 2019-10-17 MED ORDER — OMEGA-3-ACID ETHYL ESTERS 1 G PO CAPS
1.0000 g | ORAL_CAPSULE | Freq: Every day | ORAL | Status: DC
  Administered 2019-10-18 – 2019-10-20 (×3): 1 g via ORAL
  Filled 2019-10-17 (×4): qty 1

## 2019-10-17 MED ORDER — VITAMIN E 180 MG (400 UNIT) PO CAPS
400.0000 [IU] | ORAL_CAPSULE | Freq: Every day | ORAL | Status: DC
  Administered 2019-10-18 – 2019-10-20 (×3): 400 [IU] via ORAL
  Filled 2019-10-17 (×4): qty 1

## 2019-10-17 MED ORDER — LEVOFLOXACIN IN D5W 750 MG/150ML IV SOLN
750.0000 mg | INTRAVENOUS | Status: DC
  Administered 2019-10-18 – 2019-10-20 (×3): 750 mg via INTRAVENOUS
  Filled 2019-10-17 (×4): qty 150

## 2019-10-17 MED ORDER — MEMANTINE HCL 5 MG PO TABS
10.0000 mg | ORAL_TABLET | Freq: Two times a day (BID) | ORAL | Status: DC
  Administered 2019-10-17 – 2019-10-20 (×6): 10 mg via ORAL
  Filled 2019-10-17 (×6): qty 2
  Filled 2019-10-17: qty 1
  Filled 2019-10-17: qty 2

## 2019-10-17 MED ORDER — TRAMADOL HCL 50 MG PO TABS
50.0000 mg | ORAL_TABLET | Freq: Two times a day (BID) | ORAL | Status: DC | PRN
  Filled 2019-10-17: qty 1

## 2019-10-17 MED ORDER — CALCIUM CARBONATE 1250 (500 CA) MG PO TABS
4.0000 | ORAL_TABLET | Freq: Every day | ORAL | Status: DC
  Administered 2019-10-18 – 2019-10-20 (×3): 2000 mg via ORAL
  Filled 2019-10-17 (×4): qty 4

## 2019-10-17 MED ORDER — POLYETHYLENE GLYCOL 3350 17 G PO PACK
17.0000 g | PACK | Freq: Every day | ORAL | Status: DC
  Administered 2019-10-18 – 2019-10-20 (×3): 17 g via ORAL
  Filled 2019-10-17 (×3): qty 1

## 2019-10-17 MED ORDER — VITAMIN D 25 MCG (1000 UNIT) PO TABS
2000.0000 [IU] | ORAL_TABLET | Freq: Every day | ORAL | Status: DC
  Administered 2019-10-18 – 2019-10-20 (×3): 2000 [IU] via ORAL
  Filled 2019-10-17 (×4): qty 2

## 2019-10-17 MED ORDER — DONEPEZIL HCL 5 MG PO TABS
10.0000 mg | ORAL_TABLET | Freq: Every day | ORAL | Status: DC
  Administered 2019-10-18 – 2019-10-20 (×3): 10 mg via ORAL
  Filled 2019-10-17 (×4): qty 2

## 2019-10-17 MED ORDER — MELATONIN 5 MG PO TABS
5.0000 mg | ORAL_TABLET | Freq: Every day | ORAL | Status: DC
  Administered 2019-10-18 – 2019-10-19 (×2): 5 mg via ORAL
  Filled 2019-10-17 (×5): qty 1

## 2019-10-17 MED ORDER — VITAMIN C 500 MG PO TABS
1000.0000 mg | ORAL_TABLET | Freq: Every day | ORAL | Status: DC
  Administered 2019-10-18 – 2019-10-20 (×3): 1000 mg via ORAL
  Filled 2019-10-17 (×4): qty 2

## 2019-10-17 MED ORDER — ROPINIROLE HCL 0.25 MG PO TABS
0.2500 mg | ORAL_TABLET | Freq: Four times a day (QID) | ORAL | Status: DC
  Administered 2019-10-17 – 2019-10-20 (×10): 0.25 mg via ORAL
  Filled 2019-10-17 (×16): qty 1

## 2019-10-17 MED ORDER — MORPHINE SULFATE (PF) 4 MG/ML IV SOLN
4.0000 mg | Freq: Once | INTRAVENOUS | Status: AC
  Administered 2019-10-17: 4 mg via INTRAVENOUS
  Filled 2019-10-17: qty 1

## 2019-10-17 MED ORDER — ONDANSETRON HCL 4 MG/2ML IJ SOLN
4.0000 mg | Freq: Once | INTRAMUSCULAR | Status: AC
  Administered 2019-10-17: 4 mg via INTRAVENOUS
  Filled 2019-10-17: qty 2

## 2019-10-17 NOTE — ED Notes (Signed)
This RN to bedside to administer medications, pt's husband at bedside. Upon assessment pt noted to be diaphoretic, recheck of oral temp 98.7, pt noted to be lethargic, alert to conversation however is unable to maintain a conversation at this time. Pt is able to answer sentences with 1 word answers at this time. This RN messaged admitting MD regarding concerns via secure chat at this time. Will hold daily meds until hearing back from admitting due to concerns of aspiration.

## 2019-10-17 NOTE — ED Notes (Signed)
Admitting MD made aware that there has been no change in patient status. Will continue to monitor for changes in patient condition.

## 2019-10-17 NOTE — ED Notes (Signed)
This RN and Chemical engineer, RN at bedside, checked patient, pt noted to be wet despite telling this RN she was not. Pt changed into clean sheets, clean brief placed on patient at this time. Pt tolerated well. Pt repositioned in bed.

## 2019-10-17 NOTE — ED Notes (Signed)
pts husband at bedside with pt
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
## 2019-10-17 NOTE — ED Notes (Signed)
Dr. Stark Jock aware of patient's AMS at this time, per Dr. Stark Jock, hold PO medications until patient more awake, Dr. Stark Jock states will review patient's labs. Awaiting further orders.

## 2019-10-17 NOTE — ED Provider Notes (Signed)
CT of the chest/abdomen pelvis IMPRESSION: 1. Normal thoracoabdominal aorta without aneurysm or dissection. No evidence of pulmonary embolism.  2. Posterior dependent bibasilar atelectasis. Minimal patchy density over the right upper lobe and lingula likely atelectasis although infection is possible.  3.  No acute findings in the abdomen/pelvis.  4. Diverticulosis of the colon most prominent over the rectosigmoid colon without active inflammation.  5. Small uterine fibroids with the largest measuring 1.6 cm unchanged.  6. Aortic Atherosclerosis (ICD10-I70.0). Atherosclerotic coronary artery disease and mild cardiomegaly.  7.  Small umbilical hernia containing only peritoneal fat.  CT scans as dictated above.  I did turn her oxygen off and she dropped to the upper 70s.  CT did not reveal any PE, she must be developing pneumonia.  She will be given IV antibiotics and I will discuss with the hospitalist for admission due to hypoxia.    Earleen Newport, MD 10/17/19 [phone]

## 2019-10-17 NOTE — ED Notes (Signed)
Pt continues to somewhat awaken with verbal stimuli. CBG checked, 129. Pt's husband remains at bedside. VS remain at baseline for patient. Will continue to monitor for further patient needs.

## 2019-10-17 NOTE — ED Notes (Signed)
Asked pharmacist to verify patient's meds.

## 2019-10-17 NOTE — ED Notes (Addendum)
Message sent to pharmacy regarding patient's lovenox to be sent to ED. Dr. Stark Jock sent message acknowledging patient's continued lethargy at this time.

## 2019-10-17 NOTE — ED Notes (Signed)
Dr Jimmye Norman aware of sats dropping when placed on room air

## 2019-10-17 NOTE — ED Notes (Signed)
Pt incontinent of urine. Peri care provided. This RN and Set designer changed out linens.

## 2019-10-17 NOTE — ED Triage Notes (Signed)
Pt to ED via EMS from home. Pt states she has sudden chest pain that woke her from her sleep this morning, pt states pain is sharp, denies any accompanying symptoms. Pt was placed on 2L by ems, room air sats 90%. Pt took 324 aspirin prior to arrival. Pt states pain is tender on palpation.

## 2019-10-17 NOTE — Progress Notes (Signed)
Care Alignment Note  Advanced Directives Documents (Living Will, Power of Attorney) currently in the EHR no advanced directives documents available .  Has the patient discussed their wishes with their family/healthcare power of attorney yes. How much does the family or healthcare power of attorney know about their wishes. Patient's husband at bedside confirmed her CODE STATUS to be DNR/DNI in accordance with her wishes.  What does the patient/decision maker understand about their medical condition and the natural course of their disease.  Community-acquired pneumonia and patient with underlying history of rheumatoid arthritis and Parkinson's disease.  Decreased hearing.  Patient husband at bedside confirmed her CODE STATUS to DNR/DNI.  What is the patient/decision maker's biggest fear or concern for the future pain and suffering   What is the most important goal for this patient should their health condition worsen maintenance of function.  Current   Code Status: DNR  Current code status has been reviewed/updated.  Time spent:18 minutes

## 2019-10-17 NOTE — ED Notes (Signed)
Pt leaving for CT.  
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
## 2019-10-17 NOTE — ED Notes (Signed)
Report given to floor. Pt/husband updated. Pt alert and calmly laying in bed. Skin currently dry.

## 2019-10-17 NOTE — ED Notes (Signed)
This RN to bedside, pt remains lethargic, resting in bed with eyes closed. Will continue to monitor for further patient needs.

## 2019-10-18 LAB — BASIC METABOLIC PANEL
Anion gap: 6 (ref 5–15)
BUN: 18 mg/dL (ref 8–23)
CO2: 29 mmol/L (ref 22–32)
Calcium: 8.4 mg/dL — ABNORMAL LOW (ref 8.9–10.3)
Chloride: 104 mmol/L (ref 98–111)
Creatinine, Ser: 0.72 mg/dL (ref 0.44–1.00)
GFR calc Af Amer: >60 mL/min (ref >60)
GFR calc non Af Amer: >60 mL/min (ref >60)
Glucose, Bld: 100 mg/dL — ABNORMAL HIGH (ref 70–99)
Potassium: 4.1 mmol/L (ref 3.5–5.1)
Sodium: 139 mmol/L (ref 135–145)

## 2019-10-18 LAB — CBC
HCT: 35.4 % — ABNORMAL LOW (ref 36.0–46.0)
Hemoglobin: 11.5 g/dL — ABNORMAL LOW (ref 12.0–15.0)
MCH: 31.7 pg (ref 26.0–34.0)
MCHC: 32.5 g/dL (ref 30.0–36.0)
MCV: 97.5 fL (ref 80.0–100.0)
Platelets: 123 10*3/uL — ABNORMAL LOW (ref 150–400)
RBC: 3.63 MIL/uL — ABNORMAL LOW (ref 3.87–5.11)
RDW: 13 % (ref 11.5–15.5)
WBC: 13.1 10*3/uL — ABNORMAL HIGH (ref 4.0–10.5)
nRBC: 0 % (ref 0.0–0.2)

## 2019-10-18 LAB — MAGNESIUM: Magnesium: 2 mg/dL (ref 1.7–2.4)

## 2019-10-18 NOTE — Progress Notes (Signed)
Patient c/o chest pain, particularly when eating or drinking. VSS, patient in NSR on telemetry monitor. Patient states it is not different from previous chest pain she has felt this admission. Dr. Estanislado Pandy made aware; will continue to monitor.

## 2019-10-18 NOTE — Progress Notes (Signed)
Morgan Dennis at [place] NAME: Morgan Dennis    MR#:  [id number]  DATE OF BIRTH:  1934/02/09  SUBJECTIVE:  CHIEF COMPLAINT:   Chief Complaint  Patient presents with  . Chest Pain  Patient seen and evaluated today On oxygen via nasal cannula at 2 L No fever Generalized weakness Shortness of breath is better Decreased cough CTA chest no evidence of pulmonary embolism REVIEW OF SYSTEMS:    ROS  CONSTITUTIONAL: No documented fever. Has fatigue, weakness. No weight gain, no weight loss.  EYES: No blurry or double vision.  ENT: No tinnitus. No postnasal drip. No redness of the oropharynx.  RESPIRATORY: Has cough, no wheeze, no hemoptysis. Has dyspnea.  CARDIOVASCULAR: No chest pain. No orthopnea. No palpitations. No syncope.  GASTROINTESTINAL: No nausea, no vomiting or diarrhea. No abdominal pain. No melena or hematochezia.  GENITOURINARY: No dysuria or hematuria.  ENDOCRINE: No polyuria or nocturia. No heat or cold intolerance.  HEMATOLOGY: No anemia. No bruising. No bleeding.  INTEGUMENTARY: No rashes. No lesions.  MUSCULOSKELETAL: No arthritis. No swelling. No gout.  NEUROLOGIC: No numbness, tingling, or ataxia. No seizure-type activity.  PSYCHIATRIC: No anxiety. No insomnia. No ADD.   DRUG ALLERGIES:   Allergies  Allergen Reactions  . Penicillins Swelling and Rash    Other reaction(s): Rash Has patient had a PCN reaction causing immediate rash, facial/tongue/throat swelling, SOB or lightheadedness with hypotension: Yes Has patient had a PCN reaction causing severe rash involving mucus membranes or skin necrosis: Unknown Has patient had a PCN reaction that required hospitalization:Yes Has patient had a PCN reaction occurring within the last 10 years: No If all of the above answers are "NO", then may proceed with Cephalosporin use.   . Evista  [Raloxifene]     Other reaction(s): Joint Pains  . Ibandronic Acid     Other reaction(s):  Muscle Pain  . Remicade [Infliximab] Other (See Comments)    Due to bleeding ulcer issue--MD advised her not to take  . Risedronate Sodium     Other reaction(s): Joint Pains    VITALS:  Blood pressure 113/62, pulse 91, temperature 99.5 F (37.5 C), resp. rate 18, height 5\' 2"  (1.575 m), weight 76.2 kg, SpO2 94 %.  PHYSICAL EXAMINATION:   Physical Exam  GENERAL:  84 y.o.-year-old patient lying in the bed with no acute distress.  EYES: Pupils equal, round, reactive to light and accommodation. No scleral icterus. Extraocular muscles intact.  HEENT: Head atraumatic, normocephalic. Oropharynx and nasopharynx clear.  NECK:  Supple, no jugular venous distention. No thyroid enlargement, no tenderness.  LUNGS: Decreased breath sounds bilaterally, Rales heard in right lung. No use of accessory muscles of respiration.  CARDIOVASCULAR: S1, S2 normal. No murmurs, rubs, or gallops.  ABDOMEN: Soft, nontender, nondistended. Bowel sounds present. No organomegaly or mass.  EXTREMITIES: No cyanosis, clubbing or edema b/l.    NEUROLOGIC: Cranial nerves II through XII are intact. No focal Motor or sensory deficits b/l.   PSYCHIATRIC: The patient is alert and oriented x 3.  SKIN: No obvious rash, lesion, or ulcer.   LABORATORY PANEL:   CBC Recent Labs  Lab 10/18/19 0617  WBC 13.1*  HGB 11.5*  HCT 35.4*  PLT 123*   ------------------------------------------------------------------------------------------------------------------ Chemistries  Recent Labs  Lab 10/17/19 0448 10/18/19 0617  NA 137 139  K 4.3 4.1  CL 98 104  CO2 29 29  GLUCOSE 143* 100*  BUN 18 18  CREATININE 0.69 0.72  CALCIUM 9.0  8.4*  MG  --  2.0  AST 34  --   ALT 8  --   ALKPHOS 69  --   BILITOT 0.6  --    ------------------------------------------------------------------------------------------------------------------  Cardiac Enzymes No results for input(s): TROPONINI in the last 168  hours. ------------------------------------------------------------------------------------------------------------------  RADIOLOGY:  Ct Head Wo Contrast  Result Date: 10/17/2019 CLINICAL DATA:  Encephalopathy EXAM: CT HEAD WITHOUT CONTRAST TECHNIQUE: Contiguous axial images were obtained from the base of the skull through the vertex without intravenous contrast. COMPARISON:  MRI brain 03/07/2016 FINDINGS: Brain: No acute territorial infarction, hemorrhage or intracranial mass. Mild atrophy. Minimal small vessel ischemic change of the white matter. Nonenlarged ventricles Vascular: No hyperdense vessels. Scattered calcifications at the carotid siphon Skull: Normal. Negative for fracture or focal lesion. Sinuses/Orbits: No acute finding. Other: None IMPRESSION: 1. No CT evidence for acute intracranial abnormality. 2. Atrophy and mild small vessel ischemic change of the white matter Electronically Signed   By: Donavan Foil M.D.   On: 10/17/2019 15:36   Ct Angio Chest Pe W And/or Wo Contrast  Result Date: 10/17/2019 CLINICAL DATA:  Chest pain beginning this morning acutely. EXAM: CT ANGIOGRAPHY CHEST CT ABDOMEN AND PELVIS WITH CONTRAST TECHNIQUE: Multidetector CT imaging of the chest was performed using the standard protocol during bolus administration of intravenous contrast. Multiplanar CT image reconstructions and MIPs were obtained to evaluate the vascular anatomy. Multidetector CT imaging of the abdomen and pelvis was performed using the standard protocol during bolus administration of intravenous contrast. CONTRAST:  77mL OMNIPAQUE IOHEXOL 350 MG/ML SOLN COMPARISON:  06/16/2019 FINDINGS: CTA CHEST FINDINGS Cardiovascular: Mild cardiomegaly. Calcified plaque over the left anterior descending and lateral circumflex coronary arteries. Thoracic aorta is normal in caliber without evidence of aneurysm or dissection. There is mild calcified plaque over the thoracic aorta. Normal 3 vessel takeoff of the  aortic arch. Pulmonary arterial system is well opacified without evidence of emboli. Remaining vascular structures are unremarkable. Mediastinum/Nodes: 1.2 cm precarinal lymph node likely reactive, otherwise no significant mediastinal or hilar adenopathy. Remaining mediastinal structures are unremarkable. Subcentimeter hypodense nodule over the left thyroid unchanged. Lungs/Pleura: Lungs are adequately inflated and demonstrate posterior bibasilar atelectasis. Subtle patchy airspace density over the lingula and right upper lobe likely atelectasis although infection is possible. No significant effusion. Trachea moderately narrowed in the AP diameter. Remaining airways unremarkable. Musculoskeletal: Mild degenerate change of the spine. Small hemangioma over a lower thoracic vertebral body. No acute rib fractures. Review of the MIP images confirms the above findings. CT ABDOMEN and PELVIS FINDINGS Hepatobiliary: Liver, gallbladder and biliary tree are within normal. Pancreas: Normal. Spleen: Normal. Adrenals/Urinary Tract: Adrenal glands are normal. Kidneys are normal in size without hydronephrosis or nephrolithiasis. Ureters and bladder are within normal. Stomach/Bowel: Stomach and small bowel are normal. Moderate diverticulosis of the colon most prominent over the rectosigmoid colon. Resolution of previously seen diverticulitis. Appendix not well visualized. Vascular/Lymphatic: Mild calcified plaque throughout the abdominal aorta. No evidence of abdominal aneurysm. No adenopathy. Reproductive: 1.6 cm uterine fibroid with small focal calcification over the posterior body of the uterus unchanged. Ovaries unremarkable. Other: Small umbilical hernia containing only peritoneal fat. No free fluid or focal inflammatory change Musculoskeletal: Degenerative change of the spine. Stable L1 compression fracture post kyphoplasty. T11 hemangioma. Review of the MIP images confirms the above findings. IMPRESSION: 1. Normal  thoracoabdominal aorta without aneurysm or dissection. No evidence of pulmonary embolism. 2. Posterior dependent bibasilar atelectasis. Minimal patchy density over the right upper lobe and lingula likely atelectasis  although infection is possible. 3.  No acute findings in the abdomen/pelvis. 4. Diverticulosis of the colon most prominent over the rectosigmoid colon without active inflammation. 5. Small uterine fibroids with the largest measuring 1.6 cm unchanged. 6. Aortic Atherosclerosis (ICD10-I70.0). Atherosclerotic coronary artery disease and mild cardiomegaly. 7.  Small umbilical hernia containing only peritoneal fat. Electronically Signed   By: Marin Olp M.D.   On: 10/17/2019 08:00   Ct Abdomen Pelvis W Contrast  Result Date: 10/17/2019 CLINICAL DATA:  Chest pain beginning this morning acutely. EXAM: CT ANGIOGRAPHY CHEST CT ABDOMEN AND PELVIS WITH CONTRAST TECHNIQUE: Multidetector CT imaging of the chest was performed using the standard protocol during bolus administration of intravenous contrast. Multiplanar CT image reconstructions and MIPs were obtained to evaluate the vascular anatomy. Multidetector CT imaging of the abdomen and pelvis was performed using the standard protocol during bolus administration of intravenous contrast. CONTRAST:  34mL OMNIPAQUE IOHEXOL 350 MG/ML SOLN COMPARISON:  06/16/2019 FINDINGS: CTA CHEST FINDINGS Cardiovascular: Mild cardiomegaly. Calcified plaque over the left anterior descending and lateral circumflex coronary arteries. Thoracic aorta is normal in caliber without evidence of aneurysm or dissection. There is mild calcified plaque over the thoracic aorta. Normal 3 vessel takeoff of the aortic arch. Pulmonary arterial system is well opacified without evidence of emboli. Remaining vascular structures are unremarkable. Mediastinum/Nodes: 1.2 cm precarinal lymph node likely reactive, otherwise no significant mediastinal or hilar adenopathy. Remaining mediastinal  structures are unremarkable. Subcentimeter hypodense nodule over the left thyroid unchanged. Lungs/Pleura: Lungs are adequately inflated and demonstrate posterior bibasilar atelectasis. Subtle patchy airspace density over the lingula and right upper lobe likely atelectasis although infection is possible. No significant effusion. Trachea moderately narrowed in the AP diameter. Remaining airways unremarkable. Musculoskeletal: Mild degenerate change of the spine. Small hemangioma over a lower thoracic vertebral body. No acute rib fractures. Review of the MIP images confirms the above findings. CT ABDOMEN and PELVIS FINDINGS Hepatobiliary: Liver, gallbladder and biliary tree are within normal. Pancreas: Normal. Spleen: Normal. Adrenals/Urinary Tract: Adrenal glands are normal. Kidneys are normal in size without hydronephrosis or nephrolithiasis. Ureters and bladder are within normal. Stomach/Bowel: Stomach and small bowel are normal. Moderate diverticulosis of the colon most prominent over the rectosigmoid colon. Resolution of previously seen diverticulitis. Appendix not well visualized. Vascular/Lymphatic: Mild calcified plaque throughout the abdominal aorta. No evidence of abdominal aneurysm. No adenopathy. Reproductive: 1.6 cm uterine fibroid with small focal calcification over the posterior body of the uterus unchanged. Ovaries unremarkable. Other: Small umbilical hernia containing only peritoneal fat. No free fluid or focal inflammatory change Musculoskeletal: Degenerative change of the spine. Stable L1 compression fracture post kyphoplasty. T11 hemangioma. Review of the MIP images confirms the above findings. IMPRESSION: 1. Normal thoracoabdominal aorta without aneurysm or dissection. No evidence of pulmonary embolism. 2. Posterior dependent bibasilar atelectasis. Minimal patchy density over the right upper lobe and lingula likely atelectasis although infection is possible. 3.  No acute findings in the  abdomen/pelvis. 4. Diverticulosis of the colon most prominent over the rectosigmoid colon without active inflammation. 5. Small uterine fibroids with the largest measuring 1.6 cm unchanged. 6. Aortic Atherosclerosis (ICD10-I70.0). Atherosclerotic coronary artery disease and mild cardiomegaly. 7.  Small umbilical hernia containing only peritoneal fat. Electronically Signed   By: Marin Olp M.D.   On: 10/17/2019 08:00   Dg Chest Portable 1 View  Result Date: 10/17/2019 CLINICAL DATA:  Chest pain this morning. EXAM: PORTABLE CHEST 1 VIEW COMPARISON:  06/16/2019 FINDINGS: The cardiac silhouette, mediastinal and hilar contours are  within normal limits and stable. There is moderate tortuosity and calcification of the thoracic aorta. Low lung volumes with vascular crowding and streaky atelectasis. Underlying chronic bronchitic changes. No infiltrates or effusions. The bony thorax is intact. IMPRESSION: Low lung volumes with vascular crowding and atelectasis. No infiltrates or effusions. Electronically Signed   By: Marijo Sanes M.D.   On: 10/17/2019 05:17     ASSESSMENT AND PLAN:   85 year old female patient with history of rheumatoid arthritis, Parkinson's disease, hyperlipidemia, restless leg syndrome currently under hospitalist service  -Right lung community-acquired pneumonia Continue IV Levaquin antibiotic Patient allergy to penicillin CTA chest no evidence of perm embolism  -Acute hypoxia secondary to pneumonia Not on home oxygen Currently on oxygen via nasal cannula 2 L Wean oxygen if possible  -History of rheumatoid arthritis Stable On outpatient basis patient on methotrexate and Orencia  -Parkinson's disease Continue Sinemet  -DVT prophylaxis with subcu Lovenox daily  -Ambulatory dysfunction Physical therapy evaluation  All the records are reviewed and case discussed with Care Management/Social Worker. Management plans discussed with the patient, family and they are in  agreement.  CODE STATUS: DNR  DVT Prophylaxis: SCDs  TOTAL TIME TAKING CARE OF THIS PATIENT: 38 minutes.   POSSIBLE D/C IN 1 to 2 days DAYS, DEPENDING ON CLINICAL CONDITION.  Saundra Shelling M.D on 10/18/2019 at 10:46 AM  Between 7am to 6pm - Pager - [phone]  After 6pm go to [email or web address] - password [email or web address] [facility]  Office  [phone]  CC: Primary care physician; Jerrol Banana., MD  Note: This dictation was prepared with Dragon dictation along with smaller phrase technology. Any transcriptional errors that result from this process are unintentional.

## 2019-10-18 NOTE — Progress Notes (Signed)
PT Cancellation Note  Patient Details Name: Morgan Dennis MRN: [id number] DOB: May 07, 1934   Cancelled Treatment:    Reason Eval/Treat Not Completed: Pain limiting ability to participate;Fatigue/lethargy limiting ability to participate;Medical issues which prohibited therapy.  Pt was sitting with O2 off and had chest pain, deferred PT eval for now.  Talked with nurse about her symptoms.   Ramond Dial 10/18/2019, 3:55 PM   Mee Hives, PT MS Acute Rehab Dept. Number: [place] and [place]

## 2019-10-19 DIAGNOSIS — I700 Atherosclerosis of aorta: Secondary | ICD-10-CM

## 2019-10-19 DIAGNOSIS — F0390 Unspecified dementia without behavioral disturbance: Secondary | ICD-10-CM

## 2019-10-19 DIAGNOSIS — D696 Thrombocytopenia, unspecified: Secondary | ICD-10-CM

## 2019-10-19 DIAGNOSIS — J9601 Acute respiratory failure with hypoxia: Secondary | ICD-10-CM

## 2019-10-19 DIAGNOSIS — G20 Parkinson's disease: Secondary | ICD-10-CM

## 2019-10-19 DIAGNOSIS — J189 Pneumonia, unspecified organism: Principal | ICD-10-CM

## 2019-10-19 DIAGNOSIS — J9621 Acute and chronic respiratory failure with hypoxia: Secondary | ICD-10-CM

## 2019-10-19 LAB — CBC
HCT: 35.1 % — ABNORMAL LOW (ref 36.0–46.0)
Hemoglobin: 11.2 g/dL — ABNORMAL LOW (ref 12.0–15.0)
MCH: 31.5 pg (ref 26.0–34.0)
MCHC: 31.9 g/dL (ref 30.0–36.0)
MCV: 98.9 fL (ref 80.0–100.0)
Platelets: 122 10*3/uL — ABNORMAL LOW (ref 150–400)
RBC: 3.55 MIL/uL — ABNORMAL LOW (ref 3.87–5.11)
RDW: 13.1 % (ref 11.5–15.5)
WBC: 9 10*3/uL (ref 4.0–10.5)
nRBC: 0 % (ref 0.0–0.2)

## 2019-10-19 MED ORDER — SODIUM CHLORIDE 0.9% FLUSH: 10.0000 mL | INTRAVENOUS | Status: DC | PRN

## 2019-10-19 NOTE — Progress Notes (Signed)
PROGRESS NOTE  Morgan Dennis [id number] DOB: Nov 02, 1934 DOA: 10/17/2019 PCP: Jerrol Banana., MD  Brief History   85 year old woman PMH rheumatoid arthritis, Parkinson's disease presented with chest pain.  CT chest showed right upper lobe pneumonia.  A & P  Right upper lobe pneumonia with acute hypoxic respiratory failure.  CT chest no PE. --Currently on 5 L nasal cannula, not back to baseline from respiratory standpoint.  Not on home oxygen. --Continue empiric antibiotic, wean oxygen as tolerated  Thrombocytopenia --Seen intermittently in the past June and July.  Probably secondary to infection.  Stop Lovenox.  Chest pain with eating, drinking --Resolved.  Parkinson's disease --Appears stable.  Followed by Dr. Manuella Ghazi of neurology as an outpatient.  Continue Sinemet.  Dementia without behavioral disturbance --Appears stable.  Continue Aricep, Cymbalta.  Rheumatoid arthritis --Hold methotrexate and Orencia  Decreased hearing.  Deaf in left ear.  Wears hearing aid in the right ear.  Aortic atherosclerosis  DVT prophylaxis: SCDs Code Status: DNR/DNI Family Communication: husband at bedside Disposition Plan: home    Murray Hodgkins, MD  [facility] Direct contact: see [email or web address] (further directions at bottom of note if needed) 7PM-7AM contact night coverage as at bottom of note 10/19/2019, 2:46 PM  LOS: 2 days   Significant Hospital Events   . 10/30 CT chest abdomen pelvis no PE, patchy density right upper lobe and lingula, aortic atherosclerosis . 10/30 admitted for pneumonia, acute hypoxic respiratory failure   Consults:  .    Procedures:  .   Significant Diagnostic Tests:  . Covid negative . CT head no acute intracranial abnormality   Micro Data:  .    Antimicrobials:   Levofloxacin 10/30 >   Interval History/Subjective  Feels better but still short of breath and not back to baseline.  Objective   Vitals:  Vitals:   10/19/19 0517  10/19/19 1318  BP: (!) 155/80   Pulse: 79   Resp: 19   Temp: 98.6 F (37 C)   SpO2: 99% 92%    Exam:  Constitutional: Appears calm, comfortable Eyes: Grossly normal in appearance ENT: Hard of hearing, hears best on the right side Respiratory: Coarse breath sounds with adventitial sounds on the right.  No rhonchi or wheezes.  Mild increased respiratory effort. Cardiovascular regular rate and rhythm.  No murmur, rub or gallop.  No lower extremity edema. Abdomen soft Psychiatric: Grossly normal mood and affect.  Speech fluent and appropriate.  I have personally reviewed the following:   Today's Data  . Platelets 120, remainder CBC unremarkable  Scheduled Meds: . calcium carbonate  4 tablet Oral Q breakfast  . carbidopa-levodopa  1 tablet Oral QHS  . carbidopa-levodopa  1.5 tablet Oral TID  . cholecalciferol  2,000 Units Oral Daily  . darifenacin  7.5 mg Oral Daily  . donepezil  10 mg Oral Daily  . DULoxetine  60 mg Oral Daily  . folic acid  1 mg Oral Daily  . Melatonin  5 mg Oral QHS  . memantine  10 mg Oral BID  . multivitamin with minerals  1 tablet Oral Daily  . omega-3 acid ethyl esters  1 g Oral Daily  . pantoprazole  40 mg Oral Daily  . polyethylene glycol  17 g Oral Daily  . pregabalin  150 mg Oral BID  . rOPINIRole  0.25 mg Oral QID  . ascorbic acid  1,000 mg Oral Daily  . vitamin E  400 Units Oral Daily   Continuous Infusions: .  levofloxacin (LEVAQUIN) IV 750 mg (10/19/19 1036)    Principal Problem:   Right upper lobe pneumonia Active Problems:   Parkinson's disease ([place])   Acute respiratory failure with hypoxia (HCC)   Thrombocytopenia (HCC)   Dementia without behavioral disturbance ([place])   Aortic atherosclerosis ([place])   LOS: 2 days   How to contact the [facility] Attending or Consulting provider [place] or covering provider during after hours [place], for this patient?  1. Check the care team in [facility] and look for a) attending/consulting [facility] provider listed  and b) the [facility] team listed 2. Log into [email or web address] and use [facility]'s universal password to access. If you do not have the password, please contact the hospital operator. 3. Locate the [facility] provider you are looking for under [facility] and page to a number that you can be directly reached. 4. If you still have difficulty reaching the provider, please page the [facility] (Director on Call) for the Hospitalists listed on amion for assistance.

## 2019-10-19 NOTE — Evaluation (Signed)
Physical Therapy Evaluation Patient Details Name: Morgan Dennis MRN: [id number] DOB: Mar 08, 1934 Today's Date: 10/19/2019   History of Present Illness  Pt is an 85 year old female admitted with R upper lobe PNA following c/o chest pain and AMS.  CT - for PE, pt hypoxic.  PMH includes PD, RA, RLS.  Clinical Impression  Pt is an 85 year old female who lives with her husband and ambulates household distances with a RW.  Pt sitting up in bed and alert and oriented, but [facility] which affects communication.  Pt's husband present to assist with hx and managing tubes during mobility.  Pt performed bed mobility min A to initiate movement and able to sit at EOB with good balance.  She stood from bedside with CGA and heavy use of UE's.  Pt able to ambulate 60 ft with RW, reminders for posture and RW management.  Pt did require VC's and min A to lower to toilet seat and tried to sit down too quickly when returning to bed.  O2 sats remained WNL on 2L of O2.  Pt with fair strength of UE/LE's and bLE neuropathy.  Pt and husband open to education regarding [place] PT and HEP.  Pt will continue to benefit from skilled PT with focus on strength, tolerance to activity and safe mobility.    Follow Up Recommendations Home health PT;Supervision for mobility/OOB    Equipment Recommendations  None recommended by PT    Recommendations for Other Services       Precautions / Restrictions Precautions Precautions: Fall Restrictions Weight Bearing Restrictions: No      Mobility  Bed Mobility Overal bed mobility: Needs Assistance Bed Mobility: Supine to Sit;Sit to Supine     Supine to sit: Min assist Sit to supine: Min guard   General bed mobility comments: Hand held assist to initiate trunk elevation.  Able to return to bed without assistance.  Transfers Overall transfer level: Needs assistance Equipment used: Rolling walker (2 wheeled) Transfers: Sit to/from Stand Sit to Stand: Min guard         General  transfer comment: Pt able to rise from bedside with use of RW; required assistance to direct to low toilet seat for safety.  Pt able to use RW to control STS.  Ambulation/Gait Ambulation/Gait assistance: Min guard Gait Distance (Feet): 60 Feet Assistive device: Rolling walker (2 wheeled)     Gait velocity interpretation: 1.31 - 2.62 ft/sec, indicative of limited community ambulator General Gait Details: Low foot clearance and fair step length, flexed posture with VC's for RW placement.  Pt required reminders for this throughout.  Pt's husband assisted with equipment.  No LOB's  Stairs            Wheelchair Mobility    Modified Rankin (Stroke Patients Only)       Balance Overall balance assessment: Needs assistance Sitting-balance support: Feet supported Sitting balance-Leahy Scale: Good     Standing balance support: Bilateral upper extremity supported Standing balance-Leahy Scale: Fair Standing balance comment: Relies on RW, kyphotic posture.                             Pertinent Vitals/Pain Pain Assessment: No/denies pain    Home Living Family/patient expects to be discharged to:: Private residence Living Arrangements: Spouse/significant other Available Help at Discharge: Family;Available 24 hours/day Type of Home: House Home Access: Stairs to enter Entrance Stairs-Rails: Can reach both Entrance Stairs-Number of Steps: Unable to  specify Home Layout: Two level Home Equipment: Walker - 2 wheels;Cane - single point;Bedside commode Additional Comments: Interested in installing shower bars and hand held shower head.    Prior Function Level of Independence: Independent with assistive device(s)         Comments: Household ambulator with RW, able to stand in shower.     Hand Dominance        Extremity/Trunk Assessment   Upper Extremity Assessment Upper Extremity Assessment: Generalized weakness    Lower Extremity Assessment Lower Extremity  Assessment: Overall WFL for tasks assessed(Grossly 4-/5 bilaterally with neuropathy reported.)    Cervical / Trunk Assessment Cervical / Trunk Assessment: Kyphotic  Communication   Communication: HOH  Cognition Arousal/Alertness: Awake/alert Behavior During Therapy: WFL for tasks assessed/performed Overall Cognitive Status: Within Functional Limits for tasks assessed                                 General Comments: Follows directions consistently.  Some VC's needed for redirection/safety.      General Comments      Exercises Other Exercises Other Exercises: Time to monitor vitals x4 min Other Exercises: Education about benefit of HHPT and HEP. x4 min Other Exercises: Assistance with toileting x8 min   Assessment/Plan    PT Assessment Patient needs continued PT services  PT Problem List Decreased strength;Decreased activity tolerance;Decreased balance;Decreased knowledge of use of DME;Decreased mobility;Impaired sensation       PT Treatment Interventions DME instruction;Therapeutic activities;Therapeutic exercise;Gait training;Patient/family education;Stair training;Balance training;Functional mobility training    PT Goals (Current goals can be found in the Care Plan section)  Acute Rehab PT Goals Patient Stated Goal: To return home and get better at walking. PT Goal Formulation: With patient Time For Goal Achievement: 11/02/19 Potential to Achieve Goals: Good    Frequency Min 2X/week   Barriers to discharge        Co-evaluation               AM-PAC PT "6 Clicks" Mobility  Outcome Measure Help needed turning from your back to your side while in a flat bed without using bedrails?: None Help needed moving from lying on your back to sitting on the side of a flat bed without using bedrails?: A Little Help needed moving to and from a bed to a chair (including a wheelchair)?: A Little Help needed standing up from a chair using your arms (e.g.,  wheelchair or bedside chair)?: A Little Help needed to walk in hospital room?: A Little Help needed climbing 3-5 steps with a railing? : A Little 6 Click Score: 19    End of Session Equipment Utilized During Treatment: Gait belt;Oxygen Activity Tolerance: Patient tolerated treatment well Patient left: in bed;with call bell/phone within reach;with bed alarm set;with family/visitor present Nurse Communication: Mobility status PT Visit Diagnosis: Unsteadiness on feet (R26.81);Muscle weakness (generalized) (M62.81);Difficulty in walking, not elsewhere classified (R26.2)    Time: [id number] PT Time Calculation (min) (ACUTE ONLY): 43 min   Charges:   PT Evaluation $PT Eval Low Complexity: 1 Low PT Treatments $Therapeutic Activity: 8-22 mins        Roxanne Gates, PT, DPT   Roxanne Gates 10/19/2019, 3:54 PM

## 2019-10-20 LAB — CBC
HCT: 37.1 % (ref 36.0–46.0)
Hemoglobin: 12.1 g/dL (ref 12.0–15.0)
MCH: 31.3 pg (ref 26.0–34.0)
MCHC: 32.6 g/dL (ref 30.0–36.0)
MCV: 96.1 fL (ref 80.0–100.0)
Platelets: 142 10*3/uL — ABNORMAL LOW (ref 150–400)
RBC: 3.86 MIL/uL — ABNORMAL LOW (ref 3.87–5.11)
RDW: 12.9 % (ref 11.5–15.5)
WBC: 8.5 10*3/uL (ref 4.0–10.5)
nRBC: 0 % (ref 0.0–0.2)

## 2019-10-20 MED ORDER — SODIUM CHLORIDE 0.9 % IV SOLN
INTRAVENOUS | Status: DC | PRN
  Administered 2019-10-20: 20 mL via INTRAVENOUS

## 2019-10-20 MED ORDER — LEVOFLOXACIN 750 MG PO TABS: 750.0000 mg | ORAL_TABLET | Freq: Every day | ORAL | 0 refills | Status: AC

## 2019-10-20 NOTE — Discharge Summary (Signed)
Physician Discharge Summary  Morgan Dennis [id number] DOB: 04/24/1934 DOA: 10/17/2019  PCP: Jerrol Banana., MD  Admit date: 10/17/2019 Discharge date: 10/20/2019  Recommendations for Outpatient Follow-up:  1. Resolution of pneumonia  Follow-up Information    Jerrol Banana., MD. Schedule an appointment as soon as possible for a visit in 2 week(s).   Specialty: Family Medicine Contact information: [address] [place] [address] [phone]            Discharge Diagnoses: Principal diagnosis is #1 1. Right upper lobe pneumonia with acute hypoxic respiratory failure.  2. Thrombocytopenia 3. Parkinson's disease 4. Dementia without behavioral disturbance 5. Rheumatoid arthritis 6. Decreased hearing.  Deaf in left ear.   7. Aortic atherosclerosis   Discharge Condition: improved Disposition: home with HHPT, RN, aide  Diet recommendation: heart healthy  Filed Weights   10/17/19 0445  Weight: 76.2 kg    History of present illness:  85 year old woman PMH rheumatoid arthritis, Parkinson's disease presented with chest pain.  CT chest showed right upper lobe pneumonia.  Hospital Course:  Patient was treated with empiric antibiotics, with gradual clinical improvement.  She did require supplemental oxygen but was eventually weaned off this.  She is seen by home health with recommendation for home health physical therapy.  Right upper lobe pneumonia with acute hypoxic respiratory failure.  CT chest no PE. --Required up to 5 L nasal cannula initially but now is off oxygen and stable on room air. --Complete course of antibiotics at home  Thrombocytopenia --Seen intermittently in the past June and July.  Probably secondary to infection.  Platelet count recovering.  No further evaluation suggested.  Chest pain with eating, drinking --Resolved.  Parkinson's disease --Stable. Followed by Dr. Manuella Ghazi of neurology as an outpatient.  Continue  Sinemet.  Dementia without behavioral disturbance --Stable.   Continue Aricep, Cymbalta.  Rheumatoid arthritis --Resume methotrexate and Orencia on discharge  Decreased hearing.  Deaf in left ear.  Wears hearing aid in the right ear.  Aortic atherosclerosis  Significant Hospital Events    10/30 CT chest abdomen pelvis no PE, patchy density right upper lobe and lingula, aortic atherosclerosis  10/30 admitted for pneumonia, acute hypoxic respiratory failure   Significant Diagnostic Tests:   Covid negative  CT head no acute intracranial abnormality   Antimicrobials:   Levofloxacin 10/30 > 11/5  Today's assessment: S: Feels better, breathing fine, no pain, no complaints. O: Vitals:  Vitals:   10/19/19 1935 10/20/19 0454  BP: (!) 155/65 (!) 152/80  Pulse: 87 80  Resp: 17 19  Temp: 97.8 F (36.6 C) 97.7 F (36.5 C)  SpO2: 93% 97%    Constitutional:   Appears calm and comfortable Respiratory:   CTA bilaterally, no w/r/r.   Respiratory effort normal.  Cardiovascular:   RRR, no m/r/g  No LE extremity edema   Psychiatric:   Mental status o Mood, affect appropriate  Hemoglobin stable at 12.1.  Platelets recovering, 142.  WBC within normal limits at 8.5.  Discharge Instructions  Discharge Instructions    Diet - low sodium heart healthy   Complete by: As directed    Discharge instructions   Complete by: As directed    Call your physician or seek immediate medical attention for shortness of breath, chest pain or worsening of conditino.   Increase activity slowly   Complete by: As directed      Allergies as of 10/20/2019      Reactions   Penicillins  Swelling, Rash   Other reaction(s): Rash Has patient had a PCN reaction causing immediate rash, facial/tongue/throat swelling, SOB or lightheadedness with hypotension: Yes Has patient had a PCN reaction causing severe rash involving mucus membranes or skin necrosis: Unknown Has patient had a PCN  reaction that required hospitalization:Yes Has patient had a PCN reaction occurring within the last 10 years: No If all of the above answers are "NO", then may proceed with Cephalosporin use.   Evista  [raloxifene]    Other reaction(s): Joint Pains   Ibandronic Acid    Other reaction(s): Muscle Pain   Remicade [infliximab] Other (See Comments)   Due to bleeding ulcer issue--MD advised her not to take   Risedronate Sodium    Other reaction(s): Joint Pains      Medication List    TAKE these medications   abatacept 250 MG injection Commonly known as: ORENCIA Inject 125 mg into the vein every 30 (thirty) days.   acetaminophen 325 MG tablet Commonly known as: TYLENOL Take 2 tablets (650 mg total) by mouth every 6 (six) hours as needed for mild pain (or Fever >/= 101).   ascorbic acid 1000 MG tablet Commonly known as: VITAMIN C Take 1,000 mg by mouth daily.   CALCIUM PO Take 2,000 mg by mouth daily.   carbidopa-levodopa 50-200 MG tablet Commonly known as: SINEMET CR Take 1 tablet by mouth at bedtime.   carbidopa-levodopa 25-100 MG tablet Commonly known as: SINEMET IR Take 1.5 tablets by mouth 3 (three) times daily.   denosumab 60 MG/ML Sosy injection Commonly known as: PROLIA Inject 60 mg into the skin every 6 (six) months.   docusate sodium 100 MG capsule Commonly known as: COLACE Take 100 mg by mouth daily as needed.   donepezil 10 MG tablet Commonly known as: ARICEPT Take 10 mg by mouth daily.   DULoxetine 60 MG capsule Commonly known as: CYMBALTA Take 60 mg by mouth daily.   Fish Oil 1000 MG Caps Take 1 capsule (1,000 mg total) by mouth daily.   folic acid 1 MG tablet Commonly known as: FOLVITE Take 1 mg by mouth daily.   furosemide 20 MG tablet Commonly known as: LASIX Take 20 mg by mouth. Pt given med by Dr Jefm Bryant   gabapentin 600 MG tablet Commonly known as: NEURONTIN Take 1 tablet (600 mg total) by mouth 2 (two) times daily.   ketorolac 10 MG  tablet Commonly known as: TORADOL Take 10 mg by mouth every 8 (eight) hours.   levofloxacin 750 MG tablet Commonly known as: Levaquin Take 1 tablet (750 mg total) by mouth daily for 3 days. Start taking on: October 21, 2019   Melatonin 5 MG Tabs Take 5 mg by mouth at bedtime.   memantine 10 MG tablet Commonly known as: NAMENDA Take 10 mg by mouth 2 (two) times daily.   methotrexate 2.5 MG tablet Take 15 mg by mouth every Wednesday.   multivitamin with minerals Tabs tablet Take 1 tablet by mouth daily.   omeprazole 20 MG capsule Commonly known as: PRILOSEC TAKE ONE CAPSULE DAILY.   polyethylene glycol 17 g packet Commonly known as: MIRALAX / GLYCOLAX Take 17 g by mouth daily.   pregabalin 150 MG capsule Commonly known as: LYRICA Take 1 capsule by mouth 2 (two) times daily.   rOPINIRole 0.25 MG tablet Commonly known as: REQUIP Take 1 tablet by mouth 4 (four) times daily.   solifenacin 5 MG tablet Commonly known as: VESIcare Take 1 tablet (5 mg  total) by mouth daily.   traMADol 50 MG tablet Commonly known as: ULTRAM Take 50 mg by mouth every 12 (twelve) hours as needed.   Vitamin D3 50 MCG (2000 UT) Tabs Take 2,000 Units by mouth daily.   vitamin E 400 UNIT capsule Take 400 Units by mouth daily.      Allergies  Allergen Reactions   Penicillins Swelling and Rash    Other reaction(s): Rash Has patient had a PCN reaction causing immediate rash, facial/tongue/throat swelling, SOB or lightheadedness with hypotension: Yes Has patient had a PCN reaction causing severe rash involving mucus membranes or skin necrosis: Unknown Has patient had a PCN reaction that required hospitalization:Yes Has patient had a PCN reaction occurring within the last 10 years: No If all of the above answers are "NO", then may proceed with Cephalosporin use.    Evista  [Raloxifene]     Other reaction(s): Joint Pains   Ibandronic Acid     Other reaction(s): Muscle Pain   Remicade  [Infliximab] Other (See Comments)    Due to bleeding ulcer issue--MD advised her not to take   Risedronate Sodium     Other reaction(s): Joint Pains    The results of significant diagnostics from this hospitalization (including imaging, microbiology, ancillary and laboratory) are listed below for reference.    Significant Diagnostic Studies: Ct Head Wo Contrast  Result Date: 10/17/2019 CLINICAL DATA:  Encephalopathy EXAM: CT HEAD WITHOUT CONTRAST TECHNIQUE: Contiguous axial images were obtained from the base of the skull through the vertex without intravenous contrast. COMPARISON:  MRI brain 03/07/2016 FINDINGS: Brain: No acute territorial infarction, hemorrhage or intracranial mass. Mild atrophy. Minimal small vessel ischemic change of the white matter. Nonenlarged ventricles Vascular: No hyperdense vessels. Scattered calcifications at the carotid siphon Skull: Normal. Negative for fracture or focal lesion. Sinuses/Orbits: No acute finding. Other: None IMPRESSION: 1. No CT evidence for acute intracranial abnormality. 2. Atrophy and mild small vessel ischemic change of the white matter Electronically Signed   By: Donavan Foil M.D.   On: 10/17/2019 15:36   Ct Angio Chest Pe W And/or Wo Contrast  Result Date: 10/17/2019 CLINICAL DATA:  Chest pain beginning this morning acutely. EXAM: CT ANGIOGRAPHY CHEST CT ABDOMEN AND PELVIS WITH CONTRAST TECHNIQUE: Multidetector CT imaging of the chest was performed using the standard protocol during bolus administration of intravenous contrast. Multiplanar CT image reconstructions and MIPs were obtained to evaluate the vascular anatomy. Multidetector CT imaging of the abdomen and pelvis was performed using the standard protocol during bolus administration of intravenous contrast. CONTRAST:  26mL OMNIPAQUE IOHEXOL 350 MG/ML SOLN COMPARISON:  06/16/2019 FINDINGS: CTA CHEST FINDINGS Cardiovascular: Mild cardiomegaly. Calcified plaque over the left anterior  descending and lateral circumflex coronary arteries. Thoracic aorta is normal in caliber without evidence of aneurysm or dissection. There is mild calcified plaque over the thoracic aorta. Normal 3 vessel takeoff of the aortic arch. Pulmonary arterial system is well opacified without evidence of emboli. Remaining vascular structures are unremarkable. Mediastinum/Nodes: 1.2 cm precarinal lymph node likely reactive, otherwise no significant mediastinal or hilar adenopathy. Remaining mediastinal structures are unremarkable. Subcentimeter hypodense nodule over the left thyroid unchanged. Lungs/Pleura: Lungs are adequately inflated and demonstrate posterior bibasilar atelectasis. Subtle patchy airspace density over the lingula and right upper lobe likely atelectasis although infection is possible. No significant effusion. Trachea moderately narrowed in the AP diameter. Remaining airways unremarkable. Musculoskeletal: Mild degenerate change of the spine. Small hemangioma over a lower thoracic vertebral body. No acute rib fractures. Review  of the MIP images confirms the above findings. CT ABDOMEN and PELVIS FINDINGS Hepatobiliary: Liver, gallbladder and biliary tree are within normal. Pancreas: Normal. Spleen: Normal. Adrenals/Urinary Tract: Adrenal glands are normal. Kidneys are normal in size without hydronephrosis or nephrolithiasis. Ureters and bladder are within normal. Stomach/Bowel: Stomach and small bowel are normal. Moderate diverticulosis of the colon most prominent over the rectosigmoid colon. Resolution of previously seen diverticulitis. Appendix not well visualized. Vascular/Lymphatic: Mild calcified plaque throughout the abdominal aorta. No evidence of abdominal aneurysm. No adenopathy. Reproductive: 1.6 cm uterine fibroid with small focal calcification over the posterior body of the uterus unchanged. Ovaries unremarkable. Other: Small umbilical hernia containing only peritoneal fat. No free fluid or focal  inflammatory change Musculoskeletal: Degenerative change of the spine. Stable L1 compression fracture post kyphoplasty. T11 hemangioma. Review of the MIP images confirms the above findings. IMPRESSION: 1. Normal thoracoabdominal aorta without aneurysm or dissection. No evidence of pulmonary embolism. 2. Posterior dependent bibasilar atelectasis. Minimal patchy density over the right upper lobe and lingula likely atelectasis although infection is possible. 3.  No acute findings in the abdomen/pelvis. 4. Diverticulosis of the colon most prominent over the rectosigmoid colon without active inflammation. 5. Small uterine fibroids with the largest measuring 1.6 cm unchanged. 6. Aortic Atherosclerosis (ICD10-I70.0). Atherosclerotic coronary artery disease and mild cardiomegaly. 7.  Small umbilical hernia containing only peritoneal fat. Electronically Signed   By: Marin Olp M.D.   On: 10/17/2019 08:00   Ct Abdomen Pelvis W Contrast  Result Date: 10/17/2019 CLINICAL DATA:  Chest pain beginning this morning acutely. EXAM: CT ANGIOGRAPHY CHEST CT ABDOMEN AND PELVIS WITH CONTRAST TECHNIQUE: Multidetector CT imaging of the chest was performed using the standard protocol during bolus administration of intravenous contrast. Multiplanar CT image reconstructions and MIPs were obtained to evaluate the vascular anatomy. Multidetector CT imaging of the abdomen and pelvis was performed using the standard protocol during bolus administration of intravenous contrast. CONTRAST:  37mL OMNIPAQUE IOHEXOL 350 MG/ML SOLN COMPARISON:  06/16/2019 FINDINGS: CTA CHEST FINDINGS Cardiovascular: Mild cardiomegaly. Calcified plaque over the left anterior descending and lateral circumflex coronary arteries. Thoracic aorta is normal in caliber without evidence of aneurysm or dissection. There is mild calcified plaque over the thoracic aorta. Normal 3 vessel takeoff of the aortic arch. Pulmonary arterial system is well opacified without evidence  of emboli. Remaining vascular structures are unremarkable. Mediastinum/Nodes: 1.2 cm precarinal lymph node likely reactive, otherwise no significant mediastinal or hilar adenopathy. Remaining mediastinal structures are unremarkable. Subcentimeter hypodense nodule over the left thyroid unchanged. Lungs/Pleura: Lungs are adequately inflated and demonstrate posterior bibasilar atelectasis. Subtle patchy airspace density over the lingula and right upper lobe likely atelectasis although infection is possible. No significant effusion. Trachea moderately narrowed in the AP diameter. Remaining airways unremarkable. Musculoskeletal: Mild degenerate change of the spine. Small hemangioma over a lower thoracic vertebral body. No acute rib fractures. Review of the MIP images confirms the above findings. CT ABDOMEN and PELVIS FINDINGS Hepatobiliary: Liver, gallbladder and biliary tree are within normal. Pancreas: Normal. Spleen: Normal. Adrenals/Urinary Tract: Adrenal glands are normal. Kidneys are normal in size without hydronephrosis or nephrolithiasis. Ureters and bladder are within normal. Stomach/Bowel: Stomach and small bowel are normal. Moderate diverticulosis of the colon most prominent over the rectosigmoid colon. Resolution of previously seen diverticulitis. Appendix not well visualized. Vascular/Lymphatic: Mild calcified plaque throughout the abdominal aorta. No evidence of abdominal aneurysm. No adenopathy. Reproductive: 1.6 cm uterine fibroid with small focal calcification over the posterior body of the uterus unchanged.  Ovaries unremarkable. Other: Small umbilical hernia containing only peritoneal fat. No free fluid or focal inflammatory change Musculoskeletal: Degenerative change of the spine. Stable L1 compression fracture post kyphoplasty. T11 hemangioma. Review of the MIP images confirms the above findings. IMPRESSION: 1. Normal thoracoabdominal aorta without aneurysm or dissection. No evidence of pulmonary  embolism. 2. Posterior dependent bibasilar atelectasis. Minimal patchy density over the right upper lobe and lingula likely atelectasis although infection is possible. 3.  No acute findings in the abdomen/pelvis. 4. Diverticulosis of the colon most prominent over the rectosigmoid colon without active inflammation. 5. Small uterine fibroids with the largest measuring 1.6 cm unchanged. 6. Aortic Atherosclerosis (ICD10-I70.0). Atherosclerotic coronary artery disease and mild cardiomegaly. 7.  Small umbilical hernia containing only peritoneal fat. Electronically Signed   By: Marin Olp M.D.   On: 10/17/2019 08:00   Dg Chest Portable 1 View  Result Date: 10/17/2019 CLINICAL DATA:  Chest pain this morning. EXAM: PORTABLE CHEST 1 VIEW COMPARISON:  06/16/2019 FINDINGS: The cardiac silhouette, mediastinal and hilar contours are within normal limits and stable. There is moderate tortuosity and calcification of the thoracic aorta. Low lung volumes with vascular crowding and streaky atelectasis. Underlying chronic bronchitic changes. No infiltrates or effusions. The bony thorax is intact. IMPRESSION: Low lung volumes with vascular crowding and atelectasis. No infiltrates or effusions. Electronically Signed   By: Marijo Sanes M.D.   On: 10/17/2019 05:17    Microbiology: Recent Results (from the past 240 hour(s))  SARS CORONAVIRUS 2 (TAT 6-24 HRS) Nasopharyngeal Nasopharyngeal Swab     Status: None   Collection Time: 10/17/19  9:42 AM   Specimen: Nasopharyngeal Swab  Result Value Ref Range Status   SARS Coronavirus 2 NEGATIVE NEGATIVE Final    Comment: (NOTE) SARS-CoV-2 target nucleic acids are NOT DETECTED. The SARS-CoV-2 RNA is generally detectable in upper and lower respiratory specimens during the acute phase of infection. Negative results do not preclude SARS-CoV-2 infection, do not rule out co-infections with other pathogens, and should not be used as the sole basis for treatment or other patient  management decisions. Negative results must be combined with clinical observations, patient history, and epidemiological information. The expected result is Negative. Fact Sheet for Patients: SugarRoll.be Fact Sheet for Healthcare Providers: https://www.woods-mathews.com/ This test is not yet approved or cleared by the [place] FDA and  has been authorized for detection and/or diagnosis of SARS-CoV-2 by FDA under an Emergency Use Authorization (EUA). This EUA will remain  in effect (meaning this test can be used) for the duration of the COVID-19 declaration under Section 56 4(b)(1) of the Act, 21 U.S.C. section 360bbb-3(b)(1), unless the authorization is terminated or revoked sooner. Performed at [facility], [place] [address] [place], [place] [address]      Labs: Basic Metabolic Panel: Recent Labs  Lab 10/17/19 0448 10/18/19 0617  NA 137 139  K 4.3 4.1  CL 98 104  CO2 29 29  GLUCOSE 143* 100*  BUN 18 18  CREATININE 0.69 0.72  CALCIUM 9.0 8.4*  MG  --  2.0   Liver Function Tests: Recent Labs  Lab 10/17/19 0448  AST 34  ALT 8  ALKPHOS 69  BILITOT 0.6  PROT 7.1  ALBUMIN 3.9   Recent Labs  Lab 10/17/19 0448  LIPASE 21   CBC: Recent Labs  Lab 10/17/19 0448 10/18/19 0617 10/19/19 0538 10/20/19 0445  WBC 13.1* 13.1* 9.0 8.5  NEUTROABS 9.6*  --   --   --  HGB 13.7 11.5* 11.2* 12.1  HCT 41.9 35.4* 35.1* 37.1  MCV 96.8 97.5 98.9 96.1  PLT 153 123* 122* 142*    CBG: Recent Labs  Lab 10/17/19 1336  GLUCAP 129*    Principal Problem:   Right upper lobe pneumonia Active Problems:   Parkinson's disease ([place])   Acute respiratory failure with hypoxia (HCC)   Thrombocytopenia (HCC)   Dementia without behavioral disturbance ([place])   Aortic atherosclerosis ([place])   Time coordinating discharge: 35 minutes  Signed:  Murray Hodgkins, MD  [facility]  10/20/2019, 2:12 PM

## 2019-10-20 NOTE — Care Management Important Message (Signed)
Important Message  Patient Details  Name: Morgan Dennis MRN: [id number] Date of Birth: 1934/08/24   Medicare Important Message Given:  Yes     [name] A Zian Delair 10/20/2019, 1:07 PM

## 2019-10-20 NOTE — Progress Notes (Signed)
SATURATION QUALIFICATIONS: (This note is used to comply with regulatory documentation for home oxygen)  Patient Saturations on Room Air at Rest = 93%  Patient Saturations on Room Air while Ambulating = 90%  Madlyn Frankel, RN

## 2019-10-20 NOTE — TOC Transition Note (Signed)
Transition of Care [facility]) - CM/SW Discharge Note   Patient Details  Name: Morgan Dennis MRN: [id number] Date of Birth: 01-20-1934  [facility] CM/SW Contact:  Tyronn Golda, Lenice Llamas Phone Number: [phone]  10/20/2019, 3:45 PM   Clinical Narrative: Patient is medically stable for D/C home today. PT is recommending home health. MD ordered home health PT, RN and aide. Clinical Education officer, museum (CSW) met with patient and her husband Morgan Dennis was at bedside. CSW introduced self and explained role of CSW department. Per husband they live in [place] and patient has a cane, walker and bedside commode at home. CSW provided husband with CMS home health list. Per husband they do not have a home health agency preference. [facility] declined home health referral. Per Verda Cumins home health representative she can accept patient however she may have a co-pay. Husband is agreeable to [facility] and is aware there may be a co-pay. RN aware of above. Please reconsult if future social work needs arise. CSW signing off.     Final next level of care: [place] Barriers to Discharge: Barriers Resolved   Patient Goals and CMS Choice Patient states their goals for this hospitalization and ongoing recovery are:: To go home. CMS Medicare.gov Compare Post Acute Care list provided to:: Patient Choice offered to / list presented to : Patient, Spouse  Discharge Placement                       Discharge Plan and Services In-house Referral: Clinical Social Work   Post Acute Care Choice: Home Health          DME Arranged: N/A         HH Arranged: RN, PT, Nurse's Aide [place] Agency: [place] Date [place]: 10/20/19 Time [place]: [id number] Representative spoke with at [place]: [place] Determinants of Health ([place]) Interventions     Readmission Risk Interventions No flowsheet data found.

## 2019-10-21 ENCOUNTER — Telehealth: Payer: Self-pay

## 2019-10-21 NOTE — Telephone Encounter (Signed)
HFU scheduled 10/27/19

## 2019-10-21 NOTE — Telephone Encounter (Signed)
Transition Care Management Follow-up Telephone Call  Date of discharge and from where: [facility] on 10/20/19  How have you been since you were released from the hospital? Doing well, still slow moving around but husband states she was slow before hospital stay due to RA. Declines pain, fever or n/v/d.  Any questions or concerns? No   Items Reviewed:  Did the pt receive and understand the discharge instructions provided? Yes   Medications obtained and verified? Husband declined reviewing over the phone.  Any new allergies since your discharge? No   Dietary orders reviewed? Yes  Do you have support at home? Yes   Other (ie: DME, Home Health, etc) Home health ordered for PT and OT.  Functional Questionnaire: (I = Independent and D = Dependent)  Bathing/Dressing- I   Meal Prep- I  Eating- I  Maintaining continence- I  Transferring/Ambulation- Uses a cane and walker for assistance.   Managing Meds- I, husband helps when needed.   Follow up appointments reviewed:    PCP Hospital f/u appt confirmed? Yes  Scheduled to see Dr Rosanna Randy on 10/27/19 @ 3:20 PM.  [facility] f/u appt confirmed? N/A  Are transportation arrangements needed? No   If their condition worsens, is the pt aware to call  their PCP or go to the ED? Yes  Was the patient provided with contact information for the PCP's office or ED? Yes  Was the pt encouraged to call back with questions or concerns? Yes

## 2019-10-24 DIAGNOSIS — H905 Unspecified sensorineural hearing loss: Secondary | ICD-10-CM | POA: Diagnosis not present

## 2019-10-24 DIAGNOSIS — M810 Age-related osteoporosis without current pathological fracture: Secondary | ICD-10-CM | POA: Diagnosis not present

## 2019-10-24 DIAGNOSIS — G2581 Restless legs syndrome: Secondary | ICD-10-CM | POA: Diagnosis not present

## 2019-10-24 DIAGNOSIS — J42 Unspecified chronic bronchitis: Secondary | ICD-10-CM | POA: Diagnosis not present

## 2019-10-24 DIAGNOSIS — G20 Parkinson's disease: Secondary | ICD-10-CM | POA: Diagnosis not present

## 2019-10-24 DIAGNOSIS — J189 Pneumonia, unspecified organism: Secondary | ICD-10-CM | POA: Diagnosis not present

## 2019-10-24 DIAGNOSIS — R69 Illness, unspecified: Secondary | ICD-10-CM | POA: Diagnosis not present

## 2019-10-24 DIAGNOSIS — M1990 Unspecified osteoarthritis, unspecified site: Secondary | ICD-10-CM | POA: Diagnosis not present

## 2019-10-24 DIAGNOSIS — M069 Rheumatoid arthritis, unspecified: Secondary | ICD-10-CM | POA: Diagnosis not present

## 2019-10-24 DIAGNOSIS — M5441 Lumbago with sciatica, right side: Secondary | ICD-10-CM | POA: Diagnosis not present

## 2019-10-24 NOTE — Progress Notes (Signed)
Patient: Morgan Dennis Female    DOB: 18-Aug-1934   85 y.o.   MRN: [id number] Visit Date: 10/27/2019  Today's Provider: [name], MD   Chief Complaint  Patient presents with  . Hospitalization Follow-up   Subjective:   HPI  Follow up Hospitalization Transition of care visit. Patient was admitted to [facility] on 10/17/2019 and discharged on 10/20/2019. She was treated for chest pain consistent with pneumonia.Th possible pneumonia was on CT only--possible shadow. Treatment for this included Levaquin 750mg  daily x 3 days  . Telephone follow up was done on 10/21/2019.  She reports good compliance with treatment. She reports this condition is Improved.   Patient completed antibiotic and feels imporoved. However patient states her leg is still very painful. Pain seems to be down entire lateral left leg Pt is a very por historian.  Allergies  Allergen Reactions  . Penicillins Swelling and Rash    Other reaction(s): Rash Has patient had a PCN reaction causing immediate rash, facial/tongue/throat swelling, SOB or lightheadedness with hypotension: Yes Has patient had a PCN reaction causing severe rash involving mucus membranes or skin necrosis: Unknown Has patient had a PCN reaction that required hospitalization:Yes Has patient had a PCN reaction occurring within the last 10 years: No If all of the above answers are "NO", then may proceed with Cephalosporin use.   . Evista  [Raloxifene]     Other reaction(s): Joint Pains  . Ibandronic Acid     Other reaction(s): Muscle Pain  . Remicade [Infliximab] Other (See Comments)    Due to bleeding ulcer issue--MD advised her not to take  . Risedronate Sodium     Other reaction(s): Joint Pains     Current Outpatient Medications:  .  abatacept (ORENCIA) 250 MG injection, Inject 125 mg into the vein every 30 (thirty) days. , Disp: , Rfl:  .  acetaminophen (TYLENOL) 325 MG tablet, Take 2 tablets (650 mg total) by mouth every 6  (six) hours as needed for mild pain (or Fever >/= 101)., Disp: , Rfl:  .  ascorbic acid (VITAMIN C) 1000 MG tablet, Take 1,000 mg by mouth daily., Disp: , Rfl:  .  CALCIUM PO, Take 2,000 mg by mouth daily., Disp: , Rfl:  .  carbidopa-levodopa (SINEMET CR) 50-200 MG tablet, Take 1 tablet by mouth at bedtime., Disp: , Rfl:  .  carbidopa-levodopa (SINEMET IR) 25-100 MG tablet, Take 1.5 tablets by mouth 3 (three) times daily. , Disp: , Rfl:  .  Cholecalciferol (VITAMIN D3) 2000 UNITS TABS, Take 2,000 Units by mouth daily. , Disp: , Rfl:  .  denosumab (PROLIA) 60 MG/ML SOSY injection, Inject 60 mg into the skin every 6 (six) months., Disp: , Rfl:  .  docusate sodium (COLACE) 100 MG capsule, Take 100 mg by mouth daily as needed. , Disp: , Rfl:  .  donepezil (ARICEPT) 10 MG tablet, Take 10 mg by mouth daily. , Disp: , Rfl:  .  DULoxetine (CYMBALTA) 60 MG capsule, Take 60 mg by mouth daily., Disp: , Rfl:  .  folic acid (FOLVITE) 1 MG tablet, Take 1 mg by mouth daily., Disp: , Rfl:  .  gabapentin (NEURONTIN) 600 MG tablet, Take 1 tablet (600 mg total) by mouth 2 (two) times daily., Disp: 60 tablet, Rfl: 11 .  ketorolac (TORADOL) 10 MG tablet, Take 10 mg by mouth every 8 (eight) hours., Disp: , Rfl:  .  Melatonin 5 MG TABS, Take 5 mg by  mouth at bedtime. , Disp: , Rfl:  .  memantine (NAMENDA) 10 MG tablet, Take 10 mg by mouth 2 (two) times daily., Disp: , Rfl:  .  methotrexate 2.5 MG tablet, Take 15 mg by mouth every Wednesday. , Disp: , Rfl:  .  Multiple Vitamin (MULTIVITAMIN WITH MINERALS) TABS tablet, Take 1 tablet by mouth daily., Disp: , Rfl:  .  Omega-3 Fatty Acids (FISH OIL) 1000 MG CAPS, Take 1 capsule (1,000 mg total) by mouth daily., Disp: , Rfl: 0 .  omeprazole (PRILOSEC) 20 MG capsule, TAKE ONE CAPSULE DAILY., Disp: 30 capsule, Rfl: 3 .  polyethylene glycol (MIRALAX / GLYCOLAX) 17 g packet, Take 17 g by mouth daily., Disp: 14 each, Rfl: 0 .  pregabalin (LYRICA) 150 MG capsule, Take 1 capsule by  mouth 2 (two) times daily., Disp: , Rfl:  .  rOPINIRole (REQUIP) 0.25 MG tablet, Take 1 tablet by mouth 4 (four) times daily., Disp: , Rfl:  .  solifenacin (VESICARE) 5 MG tablet, Take 1 tablet (5 mg total) by mouth daily., Disp: 30 tablet, Rfl: 11 .  traMADol (ULTRAM) 50 MG tablet, Take 50 mg by mouth every 12 (twelve) hours as needed. , Disp: , Rfl:  .  vitamin E 400 UNIT capsule, Take 400 Units by mouth daily., Disp: , Rfl:  .  furosemide (LASIX) 20 MG tablet, Take 20 mg by mouth. Pt given med by Dr Jefm Bryant, Disp: , Rfl:   Review of Systems  Constitutional: Negative.   HENT: Negative.   Eyes: Negative.   Respiratory: Negative.   Cardiovascular: Negative.   Gastrointestinal: Negative.   Endocrine: Negative.   Allergic/Immunologic: Negative.   Neurological:       Chronic worsening dementia without behavioral issues.  Hematological: Negative.   Psychiatric/Behavioral: Negative.     Social History   Tobacco Use  . Smoking status: Never Smoker  . Smokeless tobacco: Never Used  Substance Use Topics  . Alcohol use: No      Objective:   BP 114/69 (BP Location: Right Arm, Patient Position: Sitting, Cuff Size: Large)   Pulse 85   Temp (!) 97.3 F (36.3 C) (Other (Comment))   Resp 18   Ht 5\' 2"  (1.575 m)   Wt 169 lb (76.7 kg)   SpO2 94%   BMI 30.91 kg/m  Vitals:   10/27/19 1553  BP: 114/69  Pulse: 85  Resp: 18  Temp: (!) 97.3 F (36.3 C)  TempSrc: Other (Comment)  SpO2: 94%  Weight: 169 lb (76.7 kg)  Height: 5\' 2"  (1.575 m)  Body mass index is 30.91 kg/m.   Physical Exam Vitals signs reviewed.  Constitutional:      Appearance: She is well-developed.  HENT:     Head: Normocephalic and atraumatic.  Eyes:     General: No scleral icterus.    Conjunctiva/sclera: Conjunctivae normal.  Neck:     Thyroid: No thyromegaly.  Cardiovascular:     Rate and Rhythm: Normal rate and regular rhythm.     Heart sounds: Murmur present.  Pulmonary:     Effort: Pulmonary  effort is normal.     Breath sounds: Normal breath sounds.  Abdominal:     Palpations: Abdomen is soft.  Musculoskeletal:     Right lower leg: No edema.     Left lower leg: No edema.  Lymphadenopathy:     Cervical: No cervical adenopathy.  Skin:    General: Skin is warm and dry.     Comments: Multiple SKs.  Neurological:     General: No focal deficit present.     Mental Status: She is alert and oriented to person, place, and time.     Comments: Strength appears normal in both legs.  Psychiatric:        Mood and Affect: Mood normal.        Behavior: Behavior normal.      No results found for any visits on 10/27/19.     Assessment & Plan    1. Pneumonia of right upper lobe due to infectious organism No furthur treatment or imaging.  2. Dementia without behavioral disturbance, unspecified dementia type ([place]) Progressive. Husband very supportive.  3. Parkinson's disease ([place])   4. Rheumatoid arthritis involving multiple sites, unspecified whether rheumatoid factor present ([place])   5. Leg pain, lateral, left Radicular vs arthritic?     Richard Cranford Mon, MD  [facility]

## 2019-10-27 ENCOUNTER — Encounter: Payer: Self-pay | Admitting: Family Medicine

## 2019-10-27 ENCOUNTER — Ambulatory Visit (INDEPENDENT_AMBULATORY_CARE_PROVIDER_SITE_OTHER): Payer: Medicare HMO | Admitting: Family Medicine

## 2019-10-27 ENCOUNTER — Other Ambulatory Visit: Payer: Self-pay

## 2019-10-27 VITALS — BP 114/69 | HR 85 | Temp 97.3°F | Resp 18 | Ht 62.0 in | Wt 169.0 lb

## 2019-10-27 DIAGNOSIS — M069 Rheumatoid arthritis, unspecified: Secondary | ICD-10-CM | POA: Diagnosis not present

## 2019-10-27 DIAGNOSIS — R69 Illness, unspecified: Secondary | ICD-10-CM | POA: Diagnosis not present

## 2019-10-27 DIAGNOSIS — J189 Pneumonia, unspecified organism: Secondary | ICD-10-CM | POA: Diagnosis not present

## 2019-10-27 DIAGNOSIS — M79605 Pain in left leg: Secondary | ICD-10-CM | POA: Diagnosis not present

## 2019-10-27 DIAGNOSIS — G20 Parkinson's disease: Secondary | ICD-10-CM | POA: Diagnosis not present

## 2019-10-27 DIAGNOSIS — M1990 Unspecified osteoarthritis, unspecified site: Secondary | ICD-10-CM | POA: Diagnosis not present

## 2019-10-27 DIAGNOSIS — H905 Unspecified sensorineural hearing loss: Secondary | ICD-10-CM | POA: Diagnosis not present

## 2019-10-27 DIAGNOSIS — M810 Age-related osteoporosis without current pathological fracture: Secondary | ICD-10-CM | POA: Diagnosis not present

## 2019-10-27 DIAGNOSIS — J42 Unspecified chronic bronchitis: Secondary | ICD-10-CM | POA: Diagnosis not present

## 2019-10-27 DIAGNOSIS — G2581 Restless legs syndrome: Secondary | ICD-10-CM | POA: Diagnosis not present

## 2019-10-27 DIAGNOSIS — M5441 Lumbago with sciatica, right side: Secondary | ICD-10-CM | POA: Diagnosis not present

## 2019-10-27 DIAGNOSIS — F0390 Unspecified dementia without behavioral disturbance: Secondary | ICD-10-CM

## 2019-10-27 NOTE — Patient Instructions (Addendum)
Stop gabapentin (NEURONTIN) 600 MG tablet 3 times daily. Stop furosemide (LASIX) 20 MG tablet daily.   Follow up in January.

## 2019-10-29 DIAGNOSIS — R69 Illness, unspecified: Secondary | ICD-10-CM | POA: Diagnosis not present

## 2019-10-29 DIAGNOSIS — H905 Unspecified sensorineural hearing loss: Secondary | ICD-10-CM | POA: Diagnosis not present

## 2019-10-29 DIAGNOSIS — M5441 Lumbago with sciatica, right side: Secondary | ICD-10-CM | POA: Diagnosis not present

## 2019-10-29 DIAGNOSIS — J189 Pneumonia, unspecified organism: Secondary | ICD-10-CM | POA: Diagnosis not present

## 2019-10-29 DIAGNOSIS — M810 Age-related osteoporosis without current pathological fracture: Secondary | ICD-10-CM | POA: Diagnosis not present

## 2019-10-29 DIAGNOSIS — G2581 Restless legs syndrome: Secondary | ICD-10-CM | POA: Diagnosis not present

## 2019-10-29 DIAGNOSIS — J42 Unspecified chronic bronchitis: Secondary | ICD-10-CM | POA: Diagnosis not present

## 2019-10-29 DIAGNOSIS — G20 Parkinson's disease: Secondary | ICD-10-CM | POA: Diagnosis not present

## 2019-10-29 DIAGNOSIS — M1990 Unspecified osteoarthritis, unspecified site: Secondary | ICD-10-CM | POA: Diagnosis not present

## 2019-10-29 DIAGNOSIS — M069 Rheumatoid arthritis, unspecified: Secondary | ICD-10-CM | POA: Diagnosis not present

## 2019-10-31 DIAGNOSIS — M069 Rheumatoid arthritis, unspecified: Secondary | ICD-10-CM | POA: Diagnosis not present

## 2019-10-31 DIAGNOSIS — G20 Parkinson's disease: Secondary | ICD-10-CM | POA: Diagnosis not present

## 2019-10-31 DIAGNOSIS — J42 Unspecified chronic bronchitis: Secondary | ICD-10-CM | POA: Diagnosis not present

## 2019-10-31 DIAGNOSIS — M5441 Lumbago with sciatica, right side: Secondary | ICD-10-CM | POA: Diagnosis not present

## 2019-10-31 DIAGNOSIS — J189 Pneumonia, unspecified organism: Secondary | ICD-10-CM | POA: Diagnosis not present

## 2019-10-31 DIAGNOSIS — M1990 Unspecified osteoarthritis, unspecified site: Secondary | ICD-10-CM | POA: Diagnosis not present

## 2019-10-31 DIAGNOSIS — G2581 Restless legs syndrome: Secondary | ICD-10-CM | POA: Diagnosis not present

## 2019-10-31 DIAGNOSIS — M810 Age-related osteoporosis without current pathological fracture: Secondary | ICD-10-CM | POA: Diagnosis not present

## 2019-10-31 DIAGNOSIS — R69 Illness, unspecified: Secondary | ICD-10-CM | POA: Diagnosis not present

## 2019-10-31 DIAGNOSIS — H905 Unspecified sensorineural hearing loss: Secondary | ICD-10-CM | POA: Diagnosis not present

## 2019-11-03 DIAGNOSIS — M0579 Rheumatoid arthritis with rheumatoid factor of multiple sites without organ or systems involvement: Secondary | ICD-10-CM | POA: Diagnosis not present

## 2019-11-03 DIAGNOSIS — M0589 Other rheumatoid arthritis with rheumatoid factor of multiple sites: Secondary | ICD-10-CM | POA: Diagnosis not present

## 2019-11-04 DIAGNOSIS — M069 Rheumatoid arthritis, unspecified: Secondary | ICD-10-CM | POA: Diagnosis not present

## 2019-11-04 DIAGNOSIS — G20 Parkinson's disease: Secondary | ICD-10-CM | POA: Diagnosis not present

## 2019-11-04 DIAGNOSIS — G2581 Restless legs syndrome: Secondary | ICD-10-CM | POA: Diagnosis not present

## 2019-11-04 DIAGNOSIS — M5441 Lumbago with sciatica, right side: Secondary | ICD-10-CM | POA: Diagnosis not present

## 2019-11-04 DIAGNOSIS — H905 Unspecified sensorineural hearing loss: Secondary | ICD-10-CM | POA: Diagnosis not present

## 2019-11-04 DIAGNOSIS — J42 Unspecified chronic bronchitis: Secondary | ICD-10-CM | POA: Diagnosis not present

## 2019-11-04 DIAGNOSIS — R69 Illness, unspecified: Secondary | ICD-10-CM | POA: Diagnosis not present

## 2019-11-04 DIAGNOSIS — M810 Age-related osteoporosis without current pathological fracture: Secondary | ICD-10-CM | POA: Diagnosis not present

## 2019-11-04 DIAGNOSIS — M1990 Unspecified osteoarthritis, unspecified site: Secondary | ICD-10-CM | POA: Diagnosis not present

## 2019-11-04 DIAGNOSIS — J189 Pneumonia, unspecified organism: Secondary | ICD-10-CM | POA: Diagnosis not present

## 2019-11-05 DIAGNOSIS — M1990 Unspecified osteoarthritis, unspecified site: Secondary | ICD-10-CM | POA: Diagnosis not present

## 2019-11-05 DIAGNOSIS — G2581 Restless legs syndrome: Secondary | ICD-10-CM | POA: Diagnosis not present

## 2019-11-05 DIAGNOSIS — J189 Pneumonia, unspecified organism: Secondary | ICD-10-CM | POA: Diagnosis not present

## 2019-11-05 DIAGNOSIS — J42 Unspecified chronic bronchitis: Secondary | ICD-10-CM | POA: Diagnosis not present

## 2019-11-05 DIAGNOSIS — M069 Rheumatoid arthritis, unspecified: Secondary | ICD-10-CM | POA: Diagnosis not present

## 2019-11-05 DIAGNOSIS — M810 Age-related osteoporosis without current pathological fracture: Secondary | ICD-10-CM | POA: Diagnosis not present

## 2019-11-05 DIAGNOSIS — H905 Unspecified sensorineural hearing loss: Secondary | ICD-10-CM | POA: Diagnosis not present

## 2019-11-05 DIAGNOSIS — G20 Parkinson's disease: Secondary | ICD-10-CM | POA: Diagnosis not present

## 2019-11-05 DIAGNOSIS — M5441 Lumbago with sciatica, right side: Secondary | ICD-10-CM | POA: Diagnosis not present

## 2019-11-05 DIAGNOSIS — R69 Illness, unspecified: Secondary | ICD-10-CM | POA: Diagnosis not present

## 2019-11-07 ENCOUNTER — Telehealth: Payer: Self-pay | Admitting: Family Medicine

## 2019-11-07 DIAGNOSIS — M5136 Other intervertebral disc degeneration, lumbar region: Secondary | ICD-10-CM | POA: Diagnosis not present

## 2019-11-07 DIAGNOSIS — M1990 Unspecified osteoarthritis, unspecified site: Secondary | ICD-10-CM | POA: Diagnosis not present

## 2019-11-07 DIAGNOSIS — M5416 Radiculopathy, lumbar region: Secondary | ICD-10-CM | POA: Diagnosis not present

## 2019-11-07 DIAGNOSIS — M810 Age-related osteoporosis without current pathological fracture: Secondary | ICD-10-CM | POA: Diagnosis not present

## 2019-11-07 DIAGNOSIS — G20 Parkinson's disease: Secondary | ICD-10-CM | POA: Diagnosis not present

## 2019-11-07 DIAGNOSIS — R69 Illness, unspecified: Secondary | ICD-10-CM | POA: Diagnosis not present

## 2019-11-07 DIAGNOSIS — M48062 Spinal stenosis, lumbar region with neurogenic claudication: Secondary | ICD-10-CM | POA: Diagnosis not present

## 2019-11-07 DIAGNOSIS — G2581 Restless legs syndrome: Secondary | ICD-10-CM | POA: Diagnosis not present

## 2019-11-07 DIAGNOSIS — H905 Unspecified sensorineural hearing loss: Secondary | ICD-10-CM | POA: Diagnosis not present

## 2019-11-07 DIAGNOSIS — M069 Rheumatoid arthritis, unspecified: Secondary | ICD-10-CM | POA: Diagnosis not present

## 2019-11-07 DIAGNOSIS — J42 Unspecified chronic bronchitis: Secondary | ICD-10-CM | POA: Diagnosis not present

## 2019-11-07 DIAGNOSIS — M5441 Lumbago with sciatica, right side: Secondary | ICD-10-CM | POA: Diagnosis not present

## 2019-11-07 DIAGNOSIS — J189 Pneumonia, unspecified organism: Secondary | ICD-10-CM | POA: Diagnosis not present

## 2019-11-07 NOTE — Telephone Encounter (Signed)
Freda Munro from [facility] calling to see if they can get a verbal order to hold all home health services from 11/22-11/28.

## 2019-11-09 DIAGNOSIS — J42 Unspecified chronic bronchitis: Secondary | ICD-10-CM | POA: Diagnosis not present

## 2019-11-09 DIAGNOSIS — R69 Illness, unspecified: Secondary | ICD-10-CM | POA: Diagnosis not present

## 2019-11-09 DIAGNOSIS — G20 Parkinson's disease: Secondary | ICD-10-CM | POA: Diagnosis not present

## 2019-11-09 DIAGNOSIS — H905 Unspecified sensorineural hearing loss: Secondary | ICD-10-CM | POA: Diagnosis not present

## 2019-11-09 DIAGNOSIS — M069 Rheumatoid arthritis, unspecified: Secondary | ICD-10-CM | POA: Diagnosis not present

## 2019-11-09 DIAGNOSIS — J189 Pneumonia, unspecified organism: Secondary | ICD-10-CM | POA: Diagnosis not present

## 2019-11-09 DIAGNOSIS — M810 Age-related osteoporosis without current pathological fracture: Secondary | ICD-10-CM | POA: Diagnosis not present

## 2019-11-09 DIAGNOSIS — G2581 Restless legs syndrome: Secondary | ICD-10-CM | POA: Diagnosis not present

## 2019-11-09 DIAGNOSIS — M5441 Lumbago with sciatica, right side: Secondary | ICD-10-CM | POA: Diagnosis not present

## 2019-11-09 DIAGNOSIS — M1990 Unspecified osteoarthritis, unspecified site: Secondary | ICD-10-CM | POA: Diagnosis not present

## 2019-11-10 DIAGNOSIS — M069 Rheumatoid arthritis, unspecified: Secondary | ICD-10-CM | POA: Diagnosis not present

## 2019-11-10 DIAGNOSIS — J42 Unspecified chronic bronchitis: Secondary | ICD-10-CM | POA: Diagnosis not present

## 2019-11-10 DIAGNOSIS — G20 Parkinson's disease: Secondary | ICD-10-CM | POA: Diagnosis not present

## 2019-11-10 DIAGNOSIS — G2581 Restless legs syndrome: Secondary | ICD-10-CM | POA: Diagnosis not present

## 2019-11-10 DIAGNOSIS — M1990 Unspecified osteoarthritis, unspecified site: Secondary | ICD-10-CM | POA: Diagnosis not present

## 2019-11-10 DIAGNOSIS — H905 Unspecified sensorineural hearing loss: Secondary | ICD-10-CM | POA: Diagnosis not present

## 2019-11-10 DIAGNOSIS — J189 Pneumonia, unspecified organism: Secondary | ICD-10-CM | POA: Diagnosis not present

## 2019-11-10 DIAGNOSIS — M810 Age-related osteoporosis without current pathological fracture: Secondary | ICD-10-CM | POA: Diagnosis not present

## 2019-11-10 DIAGNOSIS — M5441 Lumbago with sciatica, right side: Secondary | ICD-10-CM | POA: Diagnosis not present

## 2019-11-10 DIAGNOSIS — R69 Illness, unspecified: Secondary | ICD-10-CM | POA: Diagnosis not present

## 2019-11-10 NOTE — Telephone Encounter (Signed)
From [facility] 

## 2019-11-10 NOTE — Telephone Encounter (Signed)
Freda Munro advised.   Thanks,   -Mickel Baas

## 2019-11-10 NOTE — Telephone Encounter (Signed)
OK for verbals 

## 2019-11-19 DIAGNOSIS — J42 Unspecified chronic bronchitis: Secondary | ICD-10-CM | POA: Diagnosis not present

## 2019-11-19 DIAGNOSIS — J189 Pneumonia, unspecified organism: Secondary | ICD-10-CM | POA: Diagnosis not present

## 2019-11-19 DIAGNOSIS — G2581 Restless legs syndrome: Secondary | ICD-10-CM | POA: Diagnosis not present

## 2019-11-19 DIAGNOSIS — M069 Rheumatoid arthritis, unspecified: Secondary | ICD-10-CM | POA: Diagnosis not present

## 2019-11-19 DIAGNOSIS — M810 Age-related osteoporosis without current pathological fracture: Secondary | ICD-10-CM | POA: Diagnosis not present

## 2019-11-19 DIAGNOSIS — M5441 Lumbago with sciatica, right side: Secondary | ICD-10-CM | POA: Diagnosis not present

## 2019-11-19 DIAGNOSIS — G20 Parkinson's disease: Secondary | ICD-10-CM | POA: Diagnosis not present

## 2019-11-19 DIAGNOSIS — M1990 Unspecified osteoarthritis, unspecified site: Secondary | ICD-10-CM | POA: Diagnosis not present

## 2019-11-19 DIAGNOSIS — R69 Illness, unspecified: Secondary | ICD-10-CM | POA: Diagnosis not present

## 2019-11-19 DIAGNOSIS — H905 Unspecified sensorineural hearing loss: Secondary | ICD-10-CM | POA: Diagnosis not present

## 2019-11-20 DIAGNOSIS — G20 Parkinson's disease: Secondary | ICD-10-CM | POA: Diagnosis not present

## 2019-11-20 DIAGNOSIS — M810 Age-related osteoporosis without current pathological fracture: Secondary | ICD-10-CM | POA: Diagnosis not present

## 2019-11-20 DIAGNOSIS — J42 Unspecified chronic bronchitis: Secondary | ICD-10-CM | POA: Diagnosis not present

## 2019-11-20 DIAGNOSIS — M1990 Unspecified osteoarthritis, unspecified site: Secondary | ICD-10-CM | POA: Diagnosis not present

## 2019-11-20 DIAGNOSIS — J189 Pneumonia, unspecified organism: Secondary | ICD-10-CM | POA: Diagnosis not present

## 2019-11-20 DIAGNOSIS — G2581 Restless legs syndrome: Secondary | ICD-10-CM | POA: Diagnosis not present

## 2019-11-20 DIAGNOSIS — H905 Unspecified sensorineural hearing loss: Secondary | ICD-10-CM | POA: Diagnosis not present

## 2019-11-20 DIAGNOSIS — R69 Illness, unspecified: Secondary | ICD-10-CM | POA: Diagnosis not present

## 2019-11-20 DIAGNOSIS — M069 Rheumatoid arthritis, unspecified: Secondary | ICD-10-CM | POA: Diagnosis not present

## 2019-11-20 DIAGNOSIS — M5441 Lumbago with sciatica, right side: Secondary | ICD-10-CM | POA: Diagnosis not present

## 2019-11-21 DIAGNOSIS — J189 Pneumonia, unspecified organism: Secondary | ICD-10-CM | POA: Diagnosis not present

## 2019-11-21 DIAGNOSIS — M810 Age-related osteoporosis without current pathological fracture: Secondary | ICD-10-CM | POA: Diagnosis not present

## 2019-11-21 DIAGNOSIS — G2581 Restless legs syndrome: Secondary | ICD-10-CM | POA: Diagnosis not present

## 2019-11-21 DIAGNOSIS — G20 Parkinson's disease: Secondary | ICD-10-CM | POA: Diagnosis not present

## 2019-11-21 DIAGNOSIS — M069 Rheumatoid arthritis, unspecified: Secondary | ICD-10-CM | POA: Diagnosis not present

## 2019-11-21 DIAGNOSIS — M1990 Unspecified osteoarthritis, unspecified site: Secondary | ICD-10-CM | POA: Diagnosis not present

## 2019-11-21 DIAGNOSIS — M5441 Lumbago with sciatica, right side: Secondary | ICD-10-CM | POA: Diagnosis not present

## 2019-11-21 DIAGNOSIS — H905 Unspecified sensorineural hearing loss: Secondary | ICD-10-CM | POA: Diagnosis not present

## 2019-11-21 DIAGNOSIS — J42 Unspecified chronic bronchitis: Secondary | ICD-10-CM | POA: Diagnosis not present

## 2019-11-21 DIAGNOSIS — R69 Illness, unspecified: Secondary | ICD-10-CM | POA: Diagnosis not present

## 2019-11-23 DIAGNOSIS — M1990 Unspecified osteoarthritis, unspecified site: Secondary | ICD-10-CM | POA: Diagnosis not present

## 2019-11-23 DIAGNOSIS — M5441 Lumbago with sciatica, right side: Secondary | ICD-10-CM | POA: Diagnosis not present

## 2019-11-23 DIAGNOSIS — G2581 Restless legs syndrome: Secondary | ICD-10-CM | POA: Diagnosis not present

## 2019-11-23 DIAGNOSIS — R69 Illness, unspecified: Secondary | ICD-10-CM | POA: Diagnosis not present

## 2019-11-23 DIAGNOSIS — J189 Pneumonia, unspecified organism: Secondary | ICD-10-CM | POA: Diagnosis not present

## 2019-11-23 DIAGNOSIS — G20 Parkinson's disease: Secondary | ICD-10-CM | POA: Diagnosis not present

## 2019-11-23 DIAGNOSIS — M069 Rheumatoid arthritis, unspecified: Secondary | ICD-10-CM | POA: Diagnosis not present

## 2019-11-23 DIAGNOSIS — H905 Unspecified sensorineural hearing loss: Secondary | ICD-10-CM | POA: Diagnosis not present

## 2019-11-23 DIAGNOSIS — J42 Unspecified chronic bronchitis: Secondary | ICD-10-CM | POA: Diagnosis not present

## 2019-11-23 DIAGNOSIS — M810 Age-related osteoporosis without current pathological fracture: Secondary | ICD-10-CM | POA: Diagnosis not present

## 2019-11-24 DIAGNOSIS — M810 Age-related osteoporosis without current pathological fracture: Secondary | ICD-10-CM | POA: Diagnosis not present

## 2019-11-24 DIAGNOSIS — J42 Unspecified chronic bronchitis: Secondary | ICD-10-CM | POA: Diagnosis not present

## 2019-11-24 DIAGNOSIS — G2581 Restless legs syndrome: Secondary | ICD-10-CM | POA: Diagnosis not present

## 2019-11-24 DIAGNOSIS — R69 Illness, unspecified: Secondary | ICD-10-CM | POA: Diagnosis not present

## 2019-11-24 DIAGNOSIS — M1990 Unspecified osteoarthritis, unspecified site: Secondary | ICD-10-CM | POA: Diagnosis not present

## 2019-11-24 DIAGNOSIS — G20 Parkinson's disease: Secondary | ICD-10-CM | POA: Diagnosis not present

## 2019-11-24 DIAGNOSIS — J189 Pneumonia, unspecified organism: Secondary | ICD-10-CM | POA: Diagnosis not present

## 2019-11-24 DIAGNOSIS — M5441 Lumbago with sciatica, right side: Secondary | ICD-10-CM | POA: Diagnosis not present

## 2019-11-24 DIAGNOSIS — H905 Unspecified sensorineural hearing loss: Secondary | ICD-10-CM | POA: Diagnosis not present

## 2019-11-24 DIAGNOSIS — M069 Rheumatoid arthritis, unspecified: Secondary | ICD-10-CM | POA: Diagnosis not present

## 2019-11-27 DIAGNOSIS — J42 Unspecified chronic bronchitis: Secondary | ICD-10-CM | POA: Diagnosis not present

## 2019-11-27 DIAGNOSIS — M5416 Radiculopathy, lumbar region: Secondary | ICD-10-CM | POA: Diagnosis not present

## 2019-11-27 DIAGNOSIS — H905 Unspecified sensorineural hearing loss: Secondary | ICD-10-CM | POA: Diagnosis not present

## 2019-11-27 DIAGNOSIS — M069 Rheumatoid arthritis, unspecified: Secondary | ICD-10-CM | POA: Diagnosis not present

## 2019-11-27 DIAGNOSIS — M1990 Unspecified osteoarthritis, unspecified site: Secondary | ICD-10-CM | POA: Diagnosis not present

## 2019-11-27 DIAGNOSIS — G20 Parkinson's disease: Secondary | ICD-10-CM | POA: Diagnosis not present

## 2019-11-27 DIAGNOSIS — R69 Illness, unspecified: Secondary | ICD-10-CM | POA: Diagnosis not present

## 2019-11-27 DIAGNOSIS — J189 Pneumonia, unspecified organism: Secondary | ICD-10-CM | POA: Diagnosis not present

## 2019-11-27 DIAGNOSIS — G2581 Restless legs syndrome: Secondary | ICD-10-CM | POA: Diagnosis not present

## 2019-11-27 DIAGNOSIS — M5441 Lumbago with sciatica, right side: Secondary | ICD-10-CM | POA: Diagnosis not present

## 2019-11-27 DIAGNOSIS — M48062 Spinal stenosis, lumbar region with neurogenic claudication: Secondary | ICD-10-CM | POA: Diagnosis not present

## 2019-11-27 DIAGNOSIS — M810 Age-related osteoporosis without current pathological fracture: Secondary | ICD-10-CM | POA: Diagnosis not present

## 2019-11-27 DIAGNOSIS — M5136 Other intervertebral disc degeneration, lumbar region: Secondary | ICD-10-CM | POA: Diagnosis not present

## 2019-11-29 DIAGNOSIS — M810 Age-related osteoporosis without current pathological fracture: Secondary | ICD-10-CM | POA: Diagnosis not present

## 2019-11-29 DIAGNOSIS — M5441 Lumbago with sciatica, right side: Secondary | ICD-10-CM | POA: Diagnosis not present

## 2019-11-29 DIAGNOSIS — M1990 Unspecified osteoarthritis, unspecified site: Secondary | ICD-10-CM | POA: Diagnosis not present

## 2019-11-29 DIAGNOSIS — J189 Pneumonia, unspecified organism: Secondary | ICD-10-CM | POA: Diagnosis not present

## 2019-11-29 DIAGNOSIS — G20 Parkinson's disease: Secondary | ICD-10-CM | POA: Diagnosis not present

## 2019-11-29 DIAGNOSIS — R69 Illness, unspecified: Secondary | ICD-10-CM | POA: Diagnosis not present

## 2019-11-29 DIAGNOSIS — H905 Unspecified sensorineural hearing loss: Secondary | ICD-10-CM | POA: Diagnosis not present

## 2019-11-29 DIAGNOSIS — G2581 Restless legs syndrome: Secondary | ICD-10-CM | POA: Diagnosis not present

## 2019-11-29 DIAGNOSIS — M069 Rheumatoid arthritis, unspecified: Secondary | ICD-10-CM | POA: Diagnosis not present

## 2019-11-29 DIAGNOSIS — J42 Unspecified chronic bronchitis: Secondary | ICD-10-CM | POA: Diagnosis not present

## 2019-12-01 DIAGNOSIS — M0589 Other rheumatoid arthritis with rheumatoid factor of multiple sites: Secondary | ICD-10-CM | POA: Diagnosis not present

## 2019-12-02 ENCOUNTER — Telehealth: Payer: Self-pay | Admitting: Family Medicine

## 2019-12-02 NOTE — Telephone Encounter (Signed)
[facility] 

## 2019-12-02 NOTE — Telephone Encounter (Signed)
Patient was exposed to their daughter in law and son who were sick 2 weeks ago. Patient has no symptoms. There has been no positive covid test either. Husband just wants Dr. Marlan Palau opinion. Please advise?

## 2019-12-02 NOTE — Telephone Encounter (Signed)
I do not think that situation requires a Covid test.  Tell them to continue to be safe.

## 2019-12-02 NOTE — Telephone Encounter (Signed)
Patient's husband calling to get advice from PCP as to if patient should be tested for covid-19. He states patient has no symptoms.

## 2019-12-03 DIAGNOSIS — J189 Pneumonia, unspecified organism: Secondary | ICD-10-CM | POA: Diagnosis not present

## 2019-12-03 DIAGNOSIS — M5441 Lumbago with sciatica, right side: Secondary | ICD-10-CM | POA: Diagnosis not present

## 2019-12-03 DIAGNOSIS — G20 Parkinson's disease: Secondary | ICD-10-CM | POA: Diagnosis not present

## 2019-12-03 DIAGNOSIS — H905 Unspecified sensorineural hearing loss: Secondary | ICD-10-CM | POA: Diagnosis not present

## 2019-12-03 DIAGNOSIS — M810 Age-related osteoporosis without current pathological fracture: Secondary | ICD-10-CM | POA: Diagnosis not present

## 2019-12-03 DIAGNOSIS — M1990 Unspecified osteoarthritis, unspecified site: Secondary | ICD-10-CM | POA: Diagnosis not present

## 2019-12-03 DIAGNOSIS — R69 Illness, unspecified: Secondary | ICD-10-CM | POA: Diagnosis not present

## 2019-12-03 DIAGNOSIS — M069 Rheumatoid arthritis, unspecified: Secondary | ICD-10-CM | POA: Diagnosis not present

## 2019-12-03 DIAGNOSIS — J42 Unspecified chronic bronchitis: Secondary | ICD-10-CM | POA: Diagnosis not present

## 2019-12-03 DIAGNOSIS — G2581 Restless legs syndrome: Secondary | ICD-10-CM | POA: Diagnosis not present

## 2019-12-03 NOTE — Telephone Encounter (Signed)
No answer

## 2019-12-03 NOTE — Telephone Encounter (Signed)
No answer and no vm

## 2019-12-05 DIAGNOSIS — H905 Unspecified sensorineural hearing loss: Secondary | ICD-10-CM | POA: Diagnosis not present

## 2019-12-05 DIAGNOSIS — M069 Rheumatoid arthritis, unspecified: Secondary | ICD-10-CM | POA: Diagnosis not present

## 2019-12-05 DIAGNOSIS — J42 Unspecified chronic bronchitis: Secondary | ICD-10-CM | POA: Diagnosis not present

## 2019-12-05 DIAGNOSIS — G2581 Restless legs syndrome: Secondary | ICD-10-CM | POA: Diagnosis not present

## 2019-12-05 DIAGNOSIS — M1990 Unspecified osteoarthritis, unspecified site: Secondary | ICD-10-CM | POA: Diagnosis not present

## 2019-12-05 DIAGNOSIS — M810 Age-related osteoporosis without current pathological fracture: Secondary | ICD-10-CM | POA: Diagnosis not present

## 2019-12-05 DIAGNOSIS — M5441 Lumbago with sciatica, right side: Secondary | ICD-10-CM | POA: Diagnosis not present

## 2019-12-05 DIAGNOSIS — G20 Parkinson's disease: Secondary | ICD-10-CM | POA: Diagnosis not present

## 2019-12-05 DIAGNOSIS — R69 Illness, unspecified: Secondary | ICD-10-CM | POA: Diagnosis not present

## 2019-12-05 DIAGNOSIS — J189 Pneumonia, unspecified organism: Secondary | ICD-10-CM | POA: Diagnosis not present

## 2019-12-09 ENCOUNTER — Inpatient Hospital Stay
Admission: EM | Admit: 2019-12-09 | Discharge: 2019-12-15 | DRG: 177 | Disposition: A | Discharge status: Home Health | Payer: Medicare HMO | Attending: Family Medicine | Admitting: Family Medicine

## 2019-12-09 ENCOUNTER — Encounter: Payer: Self-pay | Admitting: *Deleted

## 2019-12-09 ENCOUNTER — Other Ambulatory Visit: Payer: Self-pay

## 2019-12-09 ENCOUNTER — Ambulatory Visit: Payer: Self-pay | Admitting: *Deleted

## 2019-12-09 ENCOUNTER — Emergency Department: Payer: Medicare HMO

## 2019-12-09 DIAGNOSIS — D849 Immunodeficiency, unspecified: Secondary | ICD-10-CM | POA: Diagnosis present

## 2019-12-09 DIAGNOSIS — H905 Unspecified sensorineural hearing loss: Secondary | ICD-10-CM | POA: Diagnosis not present

## 2019-12-09 DIAGNOSIS — J9601 Acute respiratory failure with hypoxia: Secondary | ICD-10-CM | POA: Diagnosis present

## 2019-12-09 DIAGNOSIS — R531 Weakness: Secondary | ICD-10-CM | POA: Diagnosis not present

## 2019-12-09 DIAGNOSIS — M069 Rheumatoid arthritis, unspecified: Secondary | ICD-10-CM | POA: Diagnosis present

## 2019-12-09 DIAGNOSIS — M5441 Lumbago with sciatica, right side: Secondary | ICD-10-CM | POA: Diagnosis not present

## 2019-12-09 DIAGNOSIS — G629 Polyneuropathy, unspecified: Secondary | ICD-10-CM | POA: Diagnosis present

## 2019-12-09 DIAGNOSIS — G20 Parkinson's disease: Secondary | ICD-10-CM | POA: Diagnosis not present

## 2019-12-09 DIAGNOSIS — Z79891 Long term (current) use of opiate analgesic: Secondary | ICD-10-CM

## 2019-12-09 DIAGNOSIS — Z66 Do not resuscitate: Secondary | ICD-10-CM | POA: Diagnosis present

## 2019-12-09 DIAGNOSIS — F0390 Unspecified dementia without behavioral disturbance: Secondary | ICD-10-CM | POA: Diagnosis present

## 2019-12-09 DIAGNOSIS — J9600 Acute respiratory failure, unspecified whether with hypoxia or hypercapnia: Secondary | ICD-10-CM | POA: Diagnosis present

## 2019-12-09 DIAGNOSIS — J1289 Other viral pneumonia: Secondary | ICD-10-CM | POA: Diagnosis present

## 2019-12-09 DIAGNOSIS — U071 COVID-19: Secondary | ICD-10-CM | POA: Diagnosis not present

## 2019-12-09 DIAGNOSIS — D696 Thrombocytopenia, unspecified: Secondary | ICD-10-CM | POA: Diagnosis present

## 2019-12-09 DIAGNOSIS — G2581 Restless legs syndrome: Secondary | ICD-10-CM | POA: Diagnosis present

## 2019-12-09 DIAGNOSIS — F0280 Dementia in other diseases classified elsewhere without behavioral disturbance: Secondary | ICD-10-CM | POA: Diagnosis present

## 2019-12-09 DIAGNOSIS — Z8249 Family history of ischemic heart disease and other diseases of the circulatory system: Secondary | ICD-10-CM

## 2019-12-09 DIAGNOSIS — J9621 Acute and chronic respiratory failure with hypoxia: Secondary | ICD-10-CM | POA: Diagnosis present

## 2019-12-09 DIAGNOSIS — J189 Pneumonia, unspecified organism: Secondary | ICD-10-CM | POA: Diagnosis not present

## 2019-12-09 DIAGNOSIS — Z79899 Other long term (current) drug therapy: Secondary | ICD-10-CM

## 2019-12-09 DIAGNOSIS — E785 Hyperlipidemia, unspecified: Secondary | ICD-10-CM | POA: Diagnosis present

## 2019-12-09 DIAGNOSIS — K219 Gastro-esophageal reflux disease without esophagitis: Secondary | ICD-10-CM | POA: Diagnosis present

## 2019-12-09 DIAGNOSIS — R69 Illness, unspecified: Secondary | ICD-10-CM | POA: Diagnosis not present

## 2019-12-09 DIAGNOSIS — J42 Unspecified chronic bronchitis: Secondary | ICD-10-CM | POA: Diagnosis not present

## 2019-12-09 DIAGNOSIS — I10 Essential (primary) hypertension: Secondary | ICD-10-CM | POA: Diagnosis present

## 2019-12-09 DIAGNOSIS — R0902 Hypoxemia: Secondary | ICD-10-CM

## 2019-12-09 DIAGNOSIS — M810 Age-related osteoporosis without current pathological fracture: Secondary | ICD-10-CM | POA: Diagnosis not present

## 2019-12-09 DIAGNOSIS — M1990 Unspecified osteoarthritis, unspecified site: Secondary | ICD-10-CM | POA: Diagnosis not present

## 2019-12-09 LAB — CBC
HCT: 38.8 % (ref 36.0–46.0)
Hemoglobin: 12.6 g/dL (ref 12.0–15.0)
MCH: 31.3 pg (ref 26.0–34.0)
MCHC: 32.5 g/dL (ref 30.0–36.0)
MCV: 96.3 fL (ref 80.0–100.0)
Platelets: 117 10*3/uL — ABNORMAL LOW (ref 150–400)
RBC: 4.03 MIL/uL (ref 3.87–5.11)
RDW: 14.3 % (ref 11.5–15.5)
WBC: 6 10*3/uL (ref 4.0–10.5)
nRBC: 0 % (ref 0.0–0.2)

## 2019-12-09 LAB — BASIC METABOLIC PANEL
Anion gap: 9 (ref 5–15)
BUN: 29 mg/dL — ABNORMAL HIGH (ref 8–23)
CO2: 27 mmol/L (ref 22–32)
Calcium: 9.3 mg/dL (ref 8.9–10.3)
Chloride: 105 mmol/L (ref 98–111)
Creatinine, Ser: 0.87 mg/dL (ref 0.44–1.00)
GFR calc Af Amer: >60 mL/min (ref >60)
GFR calc non Af Amer: >60 mL/min (ref >60)
Glucose, Bld: 120 mg/dL — ABNORMAL HIGH (ref 70–99)
Potassium: 4.8 mmol/L (ref 3.5–5.1)
Sodium: 141 mmol/L (ref 135–145)

## 2019-12-09 LAB — TROPONIN I (HIGH SENSITIVITY): Troponin I (High Sensitivity): 12 ng/L (ref <18)

## 2019-12-09 LAB — POC SARS CORONAVIRUS 2 AG: SARS Coronavirus 2 Ag: POSITIVE — AB

## 2019-12-09 MED ORDER — SODIUM CHLORIDE 0.9 % IV BOLUS
500.0000 mL | Freq: Once | INTRAVENOUS | Status: AC
  Administered 2019-12-09: 500 mL via INTRAVENOUS

## 2019-12-09 MED ORDER — SODIUM CHLORIDE 0.9% FLUSH: 3.0000 mL | Freq: Once | INTRAVENOUS | Status: DC

## 2019-12-09 NOTE — ED Triage Notes (Signed)
First Nurse Note:  Arrives with son.  C?o low sats today.  Husband + COVID and currently hospitalized.  Patient is AAO to baseline.  Respirations regular and non labored.
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
## 2019-12-09 NOTE — ED Notes (Signed)
Pt O2 reading constantly between 93% on RA to 98% on RA.

## 2019-12-09 NOTE — ED Triage Notes (Signed)
Pt to triage via wheelchair.  Pt has dementia, brought in by son from home.  Pt's husband has covid and is in [facility] since yesterday.  Pt has chest congestion, weakness.  Denies sob or chest pain.  Pt alert  Speech clear.

## 2019-12-09 NOTE — Telephone Encounter (Signed)
Cecille Rubin, RN from [employer] called with regarding this patient getting tested for covid.  Pt's husband has tested positive and in the hospital. She is concerned that the pt's O2 sat has been between 83-89. She can up to 92 when ambulating her but goes back to the o2 sat 86-89. She has an appointment to get tested tomorrow at 3 pm. She also stated the patient has congestion in her upper right lobe but clear in the left . No fever or cough. Cecille Rubin, RN is calling to find our if there is a way the patient can get tested tonight.  [employer] notified. Spoke with Jiles Garter and advised that the patient should go to the ED. Pt has a hx of having pneumonia recently. Cecille Rubin stated that the daughter in law also had tested positive for the virus.  She voiced understanding getting the patient to the ED for assessment and testing. Routing to the practice for review. Reason for Disposition . [1] Follow-up call from patient regarding patient's clinical status AND [2] information urgent  Answer Assessment - Initial Assessment Questions 1. REASON FOR CALL or QUESTION: "What is your reason for calling today?" or "How can I best help you?" or "What question do you have that I can help answer?"     See TE 2. CALLER: Document the source of call. (e.g., laboratory, patient).   Cecille Rubin, RN  Protocols used: PCP CALL - NO TRIAGE-A-AH

## 2019-12-10 ENCOUNTER — Encounter: Payer: Self-pay | Admitting: Internal Medicine

## 2019-12-10 DIAGNOSIS — R0902 Hypoxemia: Secondary | ICD-10-CM | POA: Diagnosis not present

## 2019-12-10 DIAGNOSIS — D849 Immunodeficiency, unspecified: Secondary | ICD-10-CM | POA: Diagnosis not present

## 2019-12-10 DIAGNOSIS — U071 COVID-19: Secondary | ICD-10-CM | POA: Diagnosis not present

## 2019-12-10 DIAGNOSIS — J9601 Acute respiratory failure with hypoxia: Secondary | ICD-10-CM | POA: Diagnosis not present

## 2019-12-10 DIAGNOSIS — I472 Ventricular tachycardia: Secondary | ICD-10-CM | POA: Diagnosis not present

## 2019-12-10 DIAGNOSIS — M069 Rheumatoid arthritis, unspecified: Secondary | ICD-10-CM | POA: Diagnosis not present

## 2019-12-10 DIAGNOSIS — Z66 Do not resuscitate: Secondary | ICD-10-CM | POA: Diagnosis not present

## 2019-12-10 DIAGNOSIS — Z79891 Long term (current) use of opiate analgesic: Secondary | ICD-10-CM | POA: Diagnosis not present

## 2019-12-10 DIAGNOSIS — E785 Hyperlipidemia, unspecified: Secondary | ICD-10-CM | POA: Diagnosis present

## 2019-12-10 DIAGNOSIS — G629 Polyneuropathy, unspecified: Secondary | ICD-10-CM | POA: Diagnosis present

## 2019-12-10 DIAGNOSIS — I10 Essential (primary) hypertension: Secondary | ICD-10-CM | POA: Diagnosis not present

## 2019-12-10 DIAGNOSIS — G20 Parkinson's disease: Secondary | ICD-10-CM | POA: Diagnosis not present

## 2019-12-10 DIAGNOSIS — K219 Gastro-esophageal reflux disease without esophagitis: Secondary | ICD-10-CM | POA: Diagnosis not present

## 2019-12-10 DIAGNOSIS — J1289 Other viral pneumonia: Secondary | ICD-10-CM | POA: Diagnosis present

## 2019-12-10 DIAGNOSIS — Z79899 Other long term (current) drug therapy: Secondary | ICD-10-CM | POA: Diagnosis not present

## 2019-12-10 DIAGNOSIS — F0390 Unspecified dementia without behavioral disturbance: Secondary | ICD-10-CM | POA: Diagnosis not present

## 2019-12-10 DIAGNOSIS — F0280 Dementia in other diseases classified elsewhere without behavioral disturbance: Secondary | ICD-10-CM | POA: Diagnosis present

## 2019-12-10 DIAGNOSIS — Z8249 Family history of ischemic heart disease and other diseases of the circulatory system: Secondary | ICD-10-CM | POA: Diagnosis not present

## 2019-12-10 DIAGNOSIS — G2581 Restless legs syndrome: Secondary | ICD-10-CM | POA: Diagnosis not present

## 2019-12-10 DIAGNOSIS — R69 Illness, unspecified: Secondary | ICD-10-CM | POA: Diagnosis not present

## 2019-12-10 DIAGNOSIS — D696 Thrombocytopenia, unspecified: Secondary | ICD-10-CM | POA: Diagnosis present

## 2019-12-10 DIAGNOSIS — R531 Weakness: Secondary | ICD-10-CM | POA: Diagnosis not present

## 2019-12-10 DIAGNOSIS — J9600 Acute respiratory failure, unspecified whether with hypoxia or hypercapnia: Secondary | ICD-10-CM

## 2019-12-10 LAB — COMPREHENSIVE METABOLIC PANEL
ALT: 21 U/L (ref 0–44)
AST: 25 U/L (ref 15–41)
Albumin: 3.3 g/dL — ABNORMAL LOW (ref 3.5–5.0)
Alkaline Phosphatase: 74 U/L (ref 38–126)
Anion gap: 10 (ref 5–15)
BUN: 25 mg/dL — ABNORMAL HIGH (ref 8–23)
CO2: 22 mmol/L (ref 22–32)
Calcium: 8.4 mg/dL — ABNORMAL LOW (ref 8.9–10.3)
Chloride: 106 mmol/L (ref 98–111)
Creatinine, Ser: 0.56 mg/dL (ref 0.44–1.00)
GFR calc Af Amer: >60 mL/min (ref >60)
GFR calc non Af Amer: >60 mL/min (ref >60)
Glucose, Bld: 98 mg/dL (ref 70–99)
Potassium: 3.8 mmol/L (ref 3.5–5.1)
Sodium: 138 mmol/L (ref 135–145)
Total Bilirubin: 0.7 mg/dL (ref 0.3–1.2)
Total Protein: 6.4 g/dL — ABNORMAL LOW (ref 6.5–8.1)

## 2019-12-10 LAB — CBC
HCT: 35.1 % — ABNORMAL LOW (ref 36.0–46.0)
Hemoglobin: 12.1 g/dL (ref 12.0–15.0)
MCH: 31.2 pg (ref 26.0–34.0)
MCHC: 34.5 g/dL (ref 30.0–36.0)
MCV: 90.5 fL (ref 80.0–100.0)
Platelets: 107 10*3/uL — ABNORMAL LOW (ref 150–400)
RBC: 3.88 MIL/uL (ref 3.87–5.11)
RDW: 14.3 % (ref 11.5–15.5)
WBC: 6 10*3/uL (ref 4.0–10.5)
nRBC: 0 % (ref 0.0–0.2)

## 2019-12-10 LAB — PROCALCITONIN: Procalcitonin: <0.1 ng/mL

## 2019-12-10 LAB — ABO/RH: ABO/RH(D): O POS

## 2019-12-10 MED ORDER — ROPINIROLE HCL 0.25 MG PO TABS
0.2500 mg | ORAL_TABLET | Freq: Four times a day (QID) | ORAL | Status: DC
  Administered 2019-12-10 – 2019-12-15 (×20): 0.25 mg via ORAL
  Filled 2019-12-10 (×22): qty 1

## 2019-12-10 MED ORDER — SODIUM CHLORIDE 0.9 % IV SOLN
500.0000 mg | INTRAVENOUS | Status: DC
  Administered 2019-12-10: 500 mg via INTRAVENOUS
  Filled 2019-12-10 (×2): qty 500

## 2019-12-10 MED ORDER — DULOXETINE HCL 30 MG PO CPEP
60.0000 mg | ORAL_CAPSULE | Freq: Every day | ORAL | Status: DC
  Administered 2019-12-10 – 2019-12-15 (×6): 60 mg via ORAL
  Filled 2019-12-10 (×7): qty 2

## 2019-12-10 MED ORDER — ADULT MULTIVITAMIN W/MINERALS CH
1.0000 | ORAL_TABLET | Freq: Every day | ORAL | Status: DC
  Administered 2019-12-10 – 2019-12-15 (×6): 1 via ORAL
  Filled 2019-12-10 (×6): qty 1

## 2019-12-10 MED ORDER — SODIUM CHLORIDE 0.9 % IV SOLN
200.0000 mg | Freq: Once | INTRAVENOUS | Status: AC
  Administered 2019-12-10: 200 mg via INTRAVENOUS
  Filled 2019-12-10: qty 200

## 2019-12-10 MED ORDER — DONEPEZIL HCL 5 MG PO TABS
10.0000 mg | ORAL_TABLET | Freq: Every day | ORAL | Status: DC
  Administered 2019-12-10 – 2019-12-15 (×6): 10 mg via ORAL
  Filled 2019-12-10 (×6): qty 2

## 2019-12-10 MED ORDER — ACETAMINOPHEN 325 MG PO TABS
650.0000 mg | ORAL_TABLET | Freq: Four times a day (QID) | ORAL | Status: DC | PRN
  Administered 2019-12-10 (×3): 650 mg via ORAL
  Filled 2019-12-10 (×3): qty 2

## 2019-12-10 MED ORDER — FOLIC ACID 1 MG PO TABS
1.0000 mg | ORAL_TABLET | Freq: Every day | ORAL | Status: DC
  Administered 2019-12-10 – 2019-12-15 (×6): 1 mg via ORAL
  Filled 2019-12-10 (×6): qty 1

## 2019-12-10 MED ORDER — ZINC SULFATE 220 (50 ZN) MG PO CAPS
220.0000 mg | ORAL_CAPSULE | Freq: Every day | ORAL | Status: DC
  Administered 2019-12-10 – 2019-12-15 (×6): 220 mg via ORAL
  Filled 2019-12-10 (×6): qty 1

## 2019-12-10 MED ORDER — TRAMADOL HCL 50 MG PO TABS
50.0000 mg | ORAL_TABLET | Freq: Two times a day (BID) | ORAL | Status: DC | PRN
  Administered 2019-12-11 – 2019-12-14 (×3): 50 mg via ORAL
  Filled 2019-12-10 (×3): qty 1

## 2019-12-10 MED ORDER — DOCUSATE SODIUM 100 MG PO CAPS: 100.0000 mg | ORAL_CAPSULE | Freq: Every day | ORAL | Status: DC | PRN

## 2019-12-10 MED ORDER — HYDROCOD POLST-CPM POLST ER 10-8 MG/5ML PO SUER: 5.0000 mL | Freq: Two times a day (BID) | ORAL | Status: DC | PRN

## 2019-12-10 MED ORDER — ASCORBIC ACID 500 MG PO TABS
500.0000 mg | ORAL_TABLET | Freq: Every day | ORAL | Status: DC
  Administered 2019-12-10 – 2019-12-15 (×6): 500 mg via ORAL
  Filled 2019-12-10 (×7): qty 1

## 2019-12-10 MED ORDER — ALBUTEROL SULFATE HFA 108 (90 BASE) MCG/ACT IN AERS
2.0000 | INHALATION_SPRAY | Freq: Four times a day (QID) | RESPIRATORY_TRACT | Status: DC
  Administered 2019-12-10 – 2019-12-15 (×19): 2 via RESPIRATORY_TRACT
  Filled 2019-12-10: qty 6.7

## 2019-12-10 MED ORDER — ONDANSETRON HCL 4 MG PO TABS: 4.0000 mg | ORAL_TABLET | Freq: Four times a day (QID) | ORAL | Status: DC | PRN

## 2019-12-10 MED ORDER — SODIUM CHLORIDE 0.9 % IV SOLN
100.0000 mg | Freq: Every day | INTRAVENOUS | Status: AC
  Administered 2019-12-11 – 2019-12-14 (×4): 100 mg via INTRAVENOUS
  Filled 2019-12-10: qty 20
  Filled 2019-12-10 (×3): qty 100

## 2019-12-10 MED ORDER — GUAIFENESIN-DM 100-10 MG/5ML PO SYRP: 10.0000 mL | ORAL_SOLUTION | ORAL | Status: DC | PRN

## 2019-12-10 MED ORDER — SODIUM CHLORIDE 0.45 % IV SOLN: INTRAVENOUS | Status: DC

## 2019-12-10 MED ORDER — MEMANTINE HCL 10 MG PO TABS
10.0000 mg | ORAL_TABLET | Freq: Two times a day (BID) | ORAL | Status: DC
  Administered 2019-12-10 – 2019-12-15 (×10): 10 mg via ORAL
  Filled 2019-12-10 (×11): qty 1

## 2019-12-10 MED ORDER — DARIFENACIN HYDROBROMIDE ER 7.5 MG PO TB24
7.5000 mg | ORAL_TABLET | Freq: Every day | ORAL | Status: DC
  Administered 2019-12-10 – 2019-12-14 (×5): 7.5 mg via ORAL
  Filled 2019-12-10 (×8): qty 1

## 2019-12-10 MED ORDER — CARBIDOPA-LEVODOPA 25-100 MG PO TABS
1.5000 | ORAL_TABLET | Freq: Three times a day (TID) | ORAL | Status: DC
  Administered 2019-12-10 – 2019-12-15 (×15): 1.5 via ORAL
  Filled 2019-12-10 (×17): qty 1.5

## 2019-12-10 MED ORDER — GABAPENTIN 600 MG PO TABS
600.0000 mg | ORAL_TABLET | Freq: Two times a day (BID) | ORAL | Status: DC
  Administered 2019-12-10 – 2019-12-15 (×10): 600 mg via ORAL
  Filled 2019-12-10 (×10): qty 1

## 2019-12-10 MED ORDER — ONDANSETRON HCL 4 MG/2ML IJ SOLN: 4.0000 mg | Freq: Four times a day (QID) | INTRAMUSCULAR | Status: DC | PRN

## 2019-12-10 MED ORDER — VITAMIN E 180 MG (400 UNIT) PO CAPS
400.0000 [IU] | ORAL_CAPSULE | Freq: Every day | ORAL | Status: DC
  Administered 2019-12-10 – 2019-12-15 (×6): 400 [IU] via ORAL
  Filled 2019-12-10 (×6): qty 1

## 2019-12-10 MED ORDER — DEXAMETHASONE SODIUM PHOSPHATE 10 MG/ML IJ SOLN
6.0000 mg | INTRAMUSCULAR | Status: DC
  Administered 2019-12-10 – 2019-12-15 (×6): 6 mg via INTRAVENOUS
  Filled 2019-12-10 (×3): qty 0.6
  Filled 2019-12-10: qty 1
  Filled 2019-12-10 (×2): qty 0.6

## 2019-12-10 MED ORDER — MELATONIN 5 MG PO TABS
5.0000 mg | ORAL_TABLET | Freq: Every day | ORAL | Status: DC
  Administered 2019-12-10 – 2019-12-14 (×5): 5 mg via ORAL
  Filled 2019-12-10 (×6): qty 1

## 2019-12-10 MED ORDER — VITAMIN D 25 MCG (1000 UNIT) PO TABS
2000.0000 [IU] | ORAL_TABLET | Freq: Every day | ORAL | Status: DC
  Administered 2019-12-10 – 2019-12-15 (×6): 2000 [IU] via ORAL
  Filled 2019-12-10 (×6): qty 2

## 2019-12-10 MED ORDER — CARBIDOPA-LEVODOPA ER 50-200 MG PO TBCR
1.0000 | EXTENDED_RELEASE_TABLET | Freq: Every day | ORAL | Status: DC
  Administered 2019-12-10 – 2019-12-14 (×5): 1 via ORAL
  Filled 2019-12-10 (×6): qty 1

## 2019-12-10 MED ORDER — ENOXAPARIN SODIUM 40 MG/0.4ML ~~LOC~~ SOLN
40.0000 mg | SUBCUTANEOUS | Status: DC
  Administered 2019-12-10 – 2019-12-15 (×6): 40 mg via SUBCUTANEOUS
  Filled 2019-12-10 (×6): qty 0.4

## 2019-12-10 NOTE — ED Notes (Signed)
Pt was taken to the restroom by this RN

## 2019-12-10 NOTE — ED Provider Notes (Signed)
-----------------------------------------   12:29 AM on 12/10/2019 -----------------------------------------  Patient care assumed from Dr. Archie Balboa.  Patient has tested positive for Covid.  Husband is currently admitted for the same.  Patient is feeling short of breath, with minimal exertion desatted to 86%.  Chest x-ray shows interstitial prominence and hazy densities.  We will place on 2 L of oxygen and admit to the hospitalist service for further work-up.   Harvest Dark, MD 12/10/19 0030

## 2019-12-10 NOTE — Telephone Encounter (Signed)
FYI-husband is inpatient with covid.  Pt began having O2 sats in the 80's and wanted to get tested.  This was at the end of the day and with her oxygen at those levels I advised [facility] Triage nurse to send her to be seen.

## 2019-12-10 NOTE — Progress Notes (Signed)
Remdesivir - Pharmacy Brief Note   O:  ALT: 21 CXR:  SpO2: 96% on [place]   A/P:  Remdesivir 200 mg IVPB once followed by 100 mg IVPB daily x 4 days.   Hart Robinsons, PharmD 12/10/2019 6:45 AM

## 2019-12-10 NOTE — ED Notes (Signed)
Pt ambulated with this RN while monitoring vitals . Pt's O2 saturation dropped to 87% while ambulating, & became tachypenic. Pt unstable and needs 1x assist with ambulating. MD notified

## 2019-12-10 NOTE — Telephone Encounter (Signed)
After sending this message I checked on her chart and she has been admitted with COVID
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
## 2019-12-10 NOTE — Progress Notes (Signed)
Son, CARRIEANNE BERARDINO, has been called and updated on patients' status.

## 2019-12-10 NOTE — Progress Notes (Addendum)
  PROGRESS NOTE    Morgan Dennis  [id number] DOB: Aug 27, 1934 DOA: 12/09/2019  PCP: Jerrol Banana., MD    LOS - 0    Patient admitted overnight with acute hypoxic respiratory failure due to Covid-19 pneumonia.  Currently requiring 2 L/min oxygen to maintain O2 sat > 90%.  Otherwise patient is asymptomatic aside from fatigue and feeling generally weak.  Also appears confused, but unsure of patient's baseline mentation at this point.  Will discuss with family when I update them.    Exam - lungs clear but diminished, no respiratory distress, resting tremor of both hands noted, abdomen soft/non-tender, heart RRR with faint systolic murmur.   I have reviewed the full H&P by Dr. Jonelle Sidle, and I agree with the assessment and plan as outlined therein.   In addition: --stop antibiotics since procal normal --med rec completed - continue home meds for chronic conditions per orders  Family Updated: son, [name], updated by phone this afternoon.  He requests we keep patient's husband updated on how she is doing.  He is also admitted, with Covid-19, in room 226.   Ezekiel Slocumb, DO [facility]   If 7PM-7AM, please contact night-coverage [email or web address] Password [facility] 12/10/2019, 9:31 AM

## 2019-12-11 DIAGNOSIS — G20 Parkinson's disease: Secondary | ICD-10-CM

## 2019-12-11 DIAGNOSIS — F0390 Unspecified dementia without behavioral disturbance: Secondary | ICD-10-CM

## 2019-12-11 DIAGNOSIS — I472 Ventricular tachycardia: Secondary | ICD-10-CM

## 2019-12-11 LAB — CBC WITH DIFFERENTIAL/PLATELET
Abs Immature Granulocytes: 0.02 10*3/uL (ref 0.00–0.07)
Basophils Absolute: 0 10*3/uL (ref 0.0–0.1)
Basophils Relative: 0 %
Eosinophils Absolute: 0 10*3/uL (ref 0.0–0.5)
Eosinophils Relative: 1 %
HCT: 35 % — ABNORMAL LOW (ref 36.0–46.0)
Hemoglobin: 11.9 g/dL — ABNORMAL LOW (ref 12.0–15.0)
Immature Granulocytes: 0 %
Lymphocytes Relative: 32 %
Lymphs Abs: 1.8 10*3/uL (ref 0.7–4.0)
MCH: 31.2 pg (ref 26.0–34.0)
MCHC: 34 g/dL (ref 30.0–36.0)
MCV: 91.6 fL (ref 80.0–100.0)
Monocytes Absolute: 0.9 10*3/uL (ref 0.1–1.0)
Monocytes Relative: 15 %
Neutro Abs: 3 10*3/uL (ref 1.7–7.7)
Neutrophils Relative %: 52 %
Platelets: 119 10*3/uL — ABNORMAL LOW (ref 150–400)
RBC: 3.82 MIL/uL — ABNORMAL LOW (ref 3.87–5.11)
RDW: 14.3 % (ref 11.5–15.5)
WBC: 5.8 10*3/uL (ref 4.0–10.5)
nRBC: 0 % (ref 0.0–0.2)

## 2019-12-11 LAB — COMPREHENSIVE METABOLIC PANEL
ALT: 6 U/L (ref 0–44)
AST: 25 U/L (ref 15–41)
Albumin: 3.2 g/dL — ABNORMAL LOW (ref 3.5–5.0)
Alkaline Phosphatase: 66 U/L (ref 38–126)
Anion gap: 9 (ref 5–15)
BUN: 18 mg/dL (ref 8–23)
CO2: 27 mmol/L (ref 22–32)
Calcium: 8.4 mg/dL — ABNORMAL LOW (ref 8.9–10.3)
Chloride: 104 mmol/L (ref 98–111)
Creatinine, Ser: 0.61 mg/dL (ref 0.44–1.00)
GFR calc Af Amer: >60 mL/min (ref >60)
GFR calc non Af Amer: >60 mL/min (ref >60)
Glucose, Bld: 92 mg/dL (ref 70–99)
Potassium: 3.8 mmol/L (ref 3.5–5.1)
Sodium: 140 mmol/L (ref 135–145)
Total Bilirubin: 0.6 mg/dL (ref 0.3–1.2)
Total Protein: 6.1 g/dL — ABNORMAL LOW (ref 6.5–8.1)

## 2019-12-11 LAB — FIBRIN DERIVATIVES D-DIMER (ARMC ONLY): Fibrin derivatives D-dimer (ARMC): 453.38 ng/mL (FEU) (ref 0.00–499.00)

## 2019-12-11 LAB — FERRITIN: Ferritin: 168 ng/mL (ref 11–307)

## 2019-12-11 LAB — C-REACTIVE PROTEIN: CRP: 1.1 mg/dL — ABNORMAL HIGH (ref <1.0)

## 2019-12-11 NOTE — Progress Notes (Signed)
Patient found with large amount of blood on the floor, standing up, bed alarm going off, IV torn out, IV pump beeping. Appreciate help of Mykia, NT. Patient and environment cleaned up. Given food tray. Taken to the room bathroom. Re-applied tele box and given new gown. Instructed on use of call bell, again. Applied dressing to old IV site. Will inform physician. Reset bed alarm. Side rails up. Will continue to monitor for safety. Wenda Low [facility]

## 2019-12-11 NOTE — Evaluation (Signed)
Physical Therapy Evaluation Patient Details Name: Morgan Dennis MRN: [id number] DOB: Aug 16, 1934 Today's Date: 12/11/2019   History of Present Illness  85 year old female recently admitted with R upper lobe PNA following c/o chest pain and AMS.  Now here after home health found her to be hypoxic, Covid test (+) and admitted for associated PNA.  PMH includes PD, RA, RLS.  Clinical Impression  Pt generally did well with strength, mobility, balance and ambulation assessment.  Her biggest concern at this point is her confusion, she had no idea she was in the hospital or had tested positive for Covid, even with multiple reminders to/ the session.  Pt did well with 120 ft of ambulation on room air with walker, single UE HHA and a majority of the effort w/o AD.  She will need supervision moving forward secondary to confusion, but from a PT stand-point she generally did quite well and was able to safely mobilize for in home distances. However her safety awareness was lacking and she did appear more limited than her ostensible baseline, further PT recommended with HHPT once medically cleared for d/c.   Follow Up Recommendations Home health PT;Supervision/Assistance - 24 hour    Equipment Recommendations  None recommended by PT    Recommendations for Other Services       Precautions / Restrictions Precautions Precautions: Fall Restrictions Weight Bearing Restrictions: No      Mobility  Bed Mobility Overal bed mobility: Modified Independent             General bed mobility comments: Pt able to get herself to sitting EOB w/o assist or hesitation  Transfers Overall transfer level: Modified independent Equipment used: Rolling walker (2 wheeled)             General transfer comment: Pt struggled to stand on first attempt (hips were far back on bed), with cues to scoot forward she was able to rise w/o assist using walker  Ambulation/Gait Ambulation/Gait assistance: Min guard Gait  Distance (Feet): 130 Feet Assistive device: Rolling walker (2 wheeled);1 person hand held assist;None       General Gait Details: Pt walked ~40 ft with walker, 20 ft with single HHA and then ~60 ft w/o UEs.  Pt on room air t/o the effort with sats staying in the 90s but slowly dropping from 98% to 92% with no overt shortness of breath or complaints of fatigue.  Stairs            Wheelchair Mobility    Modified Rankin (Stroke Patients Only)       Balance Overall balance assessment: Modified Independent                                           Pertinent Vitals/Pain Pain Assessment: No/denies pain    Home Living Family/patient expects to be discharged to:: Unsure Living Arrangements: Spouse/significant other(unsure if children live with them or assist)                    Prior Function Level of Independence: Independent with assistive device(s)         Comments: Household ambulator with RW, able to stand in shower.     Hand Dominance        Extremity/Trunk Assessment   Upper Extremity Assessment Upper Extremity Assessment: Overall WFL for tasks assessed    Lower Extremity Assessment  Lower Extremity Assessment: Overall WFL for tasks assessed       Communication   Communication: HOH  Cognition Arousal/Alertness: Awake/alert Behavior During Therapy: Impulsive Overall Cognitive Status: Difficult to assess                                 General Comments: Pt quite confused, did not know date, location or situation.  Asking about her husband, surprised to find that he is also her in the hospital.      General Comments General comments (skin integrity, edema, etc.): Pt did well with purly phyiscal aspects of eval, safety concerns related to confusion and impulsivity    Exercises     Assessment/Plan    PT Assessment Patient needs continued PT services  PT Problem List Decreased activity tolerance;Decreased  balance;Decreased mobility;Decreased coordination;Decreased cognition;Decreased safety awareness;Decreased knowledge of use of DME;Cardiopulmonary status limiting activity       PT Treatment Interventions DME instruction;Gait training;Stair training;Functional mobility training;Therapeutic activities;Therapeutic exercise;Balance training;Cognitive remediation;Neuromuscular re-education;Patient/family education    PT Goals (Current goals can be found in the Care Plan section)  Acute Rehab PT Goals Patient Stated Goal: talk to her husband, called his room from her phone and they were talking post session PT Goal Formulation: With patient Time For Goal Achievement: 12/25/19 Potential to Achieve Goals: Fair    Frequency Min 2X/week   Barriers to discharge        Co-evaluation               AM-PAC PT "6 Clicks" Mobility  Outcome Measure Help needed turning from your back to your side while in a flat bed without using bedrails?: None Help needed moving from lying on your back to sitting on the side of a flat bed without using bedrails?: None Help needed moving to and from a bed to a chair (including a wheelchair)?: None Help needed standing up from a chair using your arms (e.g., wheelchair or bedside chair)?: None Help needed to walk in hospital room?: A Little Help needed climbing 3-5 steps with a railing? : A Little 6 Click Score: 22    End of Session Equipment Utilized During Treatment: Gait belt;Oxygen(on room air t/o prolonged ambulation bout) Activity Tolerance: Patient tolerated treatment well Patient left: with bed alarm set;with call bell/phone within reach Nurse Communication: Mobility status PT Visit Diagnosis: Muscle weakness (generalized) (M62.81);Difficulty in walking, not elsewhere classified (R26.2)    Time: [id number] PT Time Calculation (min) (ACUTE ONLY): 24 min   Charges:   PT Evaluation $PT Eval Low Complexity: 1 Low PT Treatments $Gait Training: 8-22  mins        Kreg Shropshire, DPT 12/11/2019, 5:25 PM

## 2019-12-11 NOTE — Progress Notes (Signed)
PROGRESS NOTE    Morgan Dennis  [id number] DOB: 1934-12-15 DOA: 12/09/2019  PCP: Jerrol Banana., MD    LOS - 1   Brief Narrative:  85 y.o. female with medical history significant of rheumatoid arthritis, hypertension, Parkinson's disease, dementia, restless leg syndrome and GERD whose husband was diagnosed with COVID-19 and is currently admitted in the hospital.  Home health nurse visit found patient hypoxic with oxygen sats in the 80s.  Had been havnig fever and chills.  In the ED, requiring 2 L/min oxygen.  Tested positive for COVID-19.  Chest x-ray showed bilateral infiltrates.  Admitted to the hospital with acute respiratory failure with hypoxemia secondary to COVID-19 pneumonia..  Subjective 12/24 - hospital day 2: Patient seen this AM.  Reports feeling better.  Off oxygen this AM to monitor pulse ox on room air.  She asks if her husband, also admitted here, can come to see her.  Assessment & Plan:   Principal Problem:   Acute respiratory failure due to COVID-19 [facility]) Active Problems:   Acid reflux   Rheumatoid arthritis (HCC)   Parkinson's disease (HCC)   Acute respiratory failure with hypoxia (HCC)   Thrombocytopenia (HCC)   Dementia without behavioral disturbance (HCC)  Acute Respiratory Failure secondary to COVID-19 Pneumonia --cont Remdesivir per pharmacy protocol --cont Decadron IV --maintain airborne and contact precautions --supplemental oxygen, maintain o2 sat > 90% --cont Ventolin inhaler --cont vitamin C and zinc --cont Tussionex and Robitussin   Rheumatoid arthritis Outpatient regimen appears to be methotrexate every Wednesday and abatacept  Parkinson's disease  Dementia without behavioral disturbance -Continue home meds: Sinemet, Aricept, Namenda, ropinirole  Peripheral neuropathy -Continue gabapentin, Cymbalta  Thrombocytopenia Suspect this is secondary to acute infection. -Monitor CBC   DVT prophylaxis: Lovenox   Code Status: DNR   Family Communication: Son updated by phone Disposition Plan: Pending further clinical improvement expect discharge home, weaning oxygen.  PT eval pending   Consultants:   None  Procedures:   None  Antimicrobials:   None   Objective: Vitals:   12/10/19 2112 12/11/19 0427 12/11/19 0428 12/11/19 0853  BP: (!) 177/80  (!) 151/76 (!) 164/78  Pulse: 79  73 71  Resp: 20  20 16   Temp: 98.6 F (37 C)  98.1 F (36.7 C) 97.7 F (36.5 C)  TempSrc: Oral  Oral   SpO2: 93%  93% 92%  Weight:  73.3 kg    Height:        Intake/Output Summary (Last 24 hours) at 12/11/2019 1346 Last data filed at 12/10/2019 1600 Gross per 24 hour  Intake 336.65 ml  Output --  Net 336.65 ml   Filed Weights   12/09/19 1756 12/11/19 0427  Weight: 80.7 kg 73.3 kg    Examination:  General exam: awake, alert, no acute distress Respiratory system: Basilar crackles bilaterally, no wheezes or rhonchi, normal respiratory effort, currently off oxygen. Cardiovascular system: normal S1/S2, RRR, no JVD, murmurs, rubs, gallops, lower extremity nonpitting edema.   Gastrointestinal system: soft, non-tender, non-distended abdomen Central nervous system: alert and oriented x3. no gross focal neurologic deficits, normal speech Extremities: moves all, no cyanosis, normal tone Skin: dry, intact, normal temperature Psychiatry: normal mood, congruent affect   Data Reviewed: I have personally reviewed following labs and imaging studies  CBC: Recent Labs  Lab 12/09/19 1800 12/10/19 0527 12/11/19 0649  WBC 6.0 6.0 5.8  NEUTROABS  --   --  3.0  HGB 12.6 12.1 11.9*  HCT 38.8 35.1* 35.0*  MCV 96.3 90.5 91.6  PLT 117* 107* 123456*   Basic Metabolic Panel: Recent Labs  Lab 12/09/19 1800 12/10/19 0527 12/11/19 0649  NA 141 138 140  K 4.8 3.8 3.8  CL 105 106 104  CO2 27 22 27   GLUCOSE 120* 98 92  BUN 29* 25* 18  CREATININE 0.87 0.56 0.61  CALCIUM 9.3 8.4* 8.4*   GFR: Estimated Creatinine Clearance:  52.2 mL/min (by C-G formula based on SCr of 0.61 mg/dL). Liver Function Tests: Recent Labs  Lab 12/10/19 0527 12/11/19 0649  AST 25 25  ALT 21 6  ALKPHOS 74 66  BILITOT 0.7 0.6  PROT 6.4* 6.1*  ALBUMIN 3.3* 3.2*   No results for input(s): LIPASE, AMYLASE in the last 168 hours. No results for input(s): AMMONIA in the last 168 hours. Coagulation Profile: No results for input(s): INR, PROTIME in the last 168 hours. Cardiac Enzymes: No results for input(s): CKTOTAL, CKMB, CKMBINDEX, TROPONINI in the last 168 hours. BNP (last 3 results) No results for input(s): PROBNP in the last 8760 hours. HbA1C: No results for input(s): HGBA1C in the last 72 hours. CBG: No results for input(s): GLUCAP in the last 168 hours. Lipid Profile: No results for input(s): CHOL, HDL, LDLCALC, TRIG, CHOLHDL, LDLDIRECT in the last 72 hours. Thyroid Function Tests: No results for input(s): TSH, T4TOTAL, FREET4, T3FREE, THYROIDAB in the last 72 hours. Anemia Panel: Recent Labs    12/11/19 0649  FERRITIN 168   Sepsis Labs: Recent Labs  Lab 12/09/19 1800  PROCALCITON <0.10    No results found for this or any previous visit (from the past 240 hour(s)).       Radiology Studies: DG Chest 2 View  Result Date: 12/09/2019 CLINICAL DATA:  85 year old female with weakness EXAM: CHEST - 2 VIEW COMPARISON:  Chest radiograph dated 10/17/2019. FINDINGS: There is shallow inspiration. Faint diffuse interstitial prominence and hazy densities may be related to poor inspiration and interstitial crowding/atelectasis. Pneumonia is not excluded. Clinical correlation is recommended. There is no focal consolidation, pleural effusion, or pneumothorax. The cardiac silhouette is within normal limits. Atherosclerotic calcification of the aorta. No acute osseous pathology. Lower thoracic compression fracture with vertebroplasty. IMPRESSION: 1. Shallow inspiration. No focal consolidation. 2. Diffuse interstitial prominence and  hazy densities may be related to poor inspiration and interstitial crowding/atelectasis. Pneumonia is not excluded. Clinical correlation is recommended. Electronically Signed   By: Anner Crete M.D.   On: 12/09/2019 18:49        Scheduled Meds: . albuterol  2 puff Inhalation Q6H  . vitamin C  500 mg Oral Daily  . carbidopa-levodopa  1 tablet Oral QHS  . carbidopa-levodopa  1.5 tablet Oral TID  . cholecalciferol  2,000 Units Oral Daily  . darifenacin  7.5 mg Oral Daily  . dexamethasone (DECADRON) injection  6 mg Intravenous Q24H  . donepezil  10 mg Oral Daily  . DULoxetine  60 mg Oral Daily  . enoxaparin (LOVENOX) injection  40 mg Subcutaneous Q24H  . folic acid  1 mg Oral Daily  . gabapentin  600 mg Oral BID  . Melatonin  5 mg Oral QHS  . memantine  10 mg Oral BID  . multivitamin with minerals  1 tablet Oral Daily  . rOPINIRole  0.25 mg Oral QID  . vitamin E  400 Units Oral Daily  . zinc sulfate  220 mg Oral Daily   Continuous Infusions: . remdesivir 100 mg in NS 100 mL 100 mg (12/11/19 1033)  LOS: 1 day    Time spent: 35-40 minutes    Ezekiel Slocumb, DO [facility]   If 7PM-7AM, please contact night-coverage [email or web address] Password [email or web address] 12/11/2019, 1:46 PM

## 2019-12-11 NOTE — Plan of Care (Signed)
  Problem: Education: Goal: Knowledge of General Education information will improve Description: Including pain rating scale, medication(s)/side effects and non-pharmacologic comfort measures Outcome: Not Progressing Note: Patient with history of dementia. Disoriented x 3 today. Impulsive. Ripped out her own PIV access. Informed physician. Will continue to monitor neurological status. Wenda Low [facility]

## 2019-12-11 NOTE — Progress Notes (Addendum)
CCMD called and reported that pt just went to VFIB Vtach 90 then went up to 138 for 1 mins then went back to SR. On assessment pt was asleep with no oxygen on. Pt pulsox when check was at 4.Pt was admiited on 2 liters oxygen which is acute for pt. Nasal Cannula was placed back on pt nose. The rest of VS were stable. Notify prime. Will continue to monitor.  Update 2311. Talked to [place] and ordered continuous pulsox, Mag Level and Stat EKG. Will continue to monitor.

## 2019-12-12 DIAGNOSIS — J9601 Acute respiratory failure with hypoxia: Secondary | ICD-10-CM

## 2019-12-12 LAB — CBC WITH DIFFERENTIAL/PLATELET
Abs Immature Granulocytes: 0.02 10*3/uL (ref 0.00–0.07)
Basophils Absolute: 0 10*3/uL (ref 0.0–0.1)
Basophils Relative: 0 %
Eosinophils Absolute: 0 10*3/uL (ref 0.0–0.5)
Eosinophils Relative: 0 %
HCT: 36.8 % (ref 36.0–46.0)
Hemoglobin: 12.8 g/dL (ref 12.0–15.0)
Immature Granulocytes: 0 %
Lymphocytes Relative: 34 %
Lymphs Abs: 1.5 10*3/uL (ref 0.7–4.0)
MCH: 31.1 pg (ref 26.0–34.0)
MCHC: 34.8 g/dL (ref 30.0–36.0)
MCV: 89.5 fL (ref 80.0–100.0)
Monocytes Absolute: 0.7 10*3/uL (ref 0.1–1.0)
Monocytes Relative: 15 %
Neutro Abs: 2.3 10*3/uL (ref 1.7–7.7)
Neutrophils Relative %: 51 %
Platelets: 149 10*3/uL — ABNORMAL LOW (ref 150–400)
RBC: 4.11 MIL/uL (ref 3.87–5.11)
RDW: 13.9 % (ref 11.5–15.5)
WBC: 4.5 10*3/uL (ref 4.0–10.5)
nRBC: 0 % (ref 0.0–0.2)

## 2019-12-12 LAB — BASIC METABOLIC PANEL
Anion gap: 9 (ref 5–15)
BUN: 19 mg/dL (ref 8–23)
CO2: 27 mmol/L (ref 22–32)
Calcium: 8.8 mg/dL — ABNORMAL LOW (ref 8.9–10.3)
Chloride: 101 mmol/L (ref 98–111)
Creatinine, Ser: 0.51 mg/dL (ref 0.44–1.00)
GFR calc Af Amer: >60 mL/min (ref >60)
GFR calc non Af Amer: >60 mL/min (ref >60)
Glucose, Bld: 110 mg/dL — ABNORMAL HIGH (ref 70–99)
Potassium: 4 mmol/L (ref 3.5–5.1)
Sodium: 137 mmol/L (ref 135–145)

## 2019-12-12 LAB — COMPREHENSIVE METABOLIC PANEL
ALT: <5 U/L (ref 0–44)
AST: 24 U/L (ref 15–41)
Albumin: 3.5 g/dL (ref 3.5–5.0)
Alkaline Phosphatase: 70 U/L (ref 38–126)
Anion gap: 7 (ref 5–15)
BUN: 19 mg/dL (ref 8–23)
CO2: 28 mmol/L (ref 22–32)
Calcium: 9 mg/dL (ref 8.9–10.3)
Chloride: 101 mmol/L (ref 98–111)
Creatinine, Ser: 0.5 mg/dL (ref 0.44–1.00)
GFR calc Af Amer: >60 mL/min (ref >60)
GFR calc non Af Amer: >60 mL/min (ref >60)
Glucose, Bld: 104 mg/dL — ABNORMAL HIGH (ref 70–99)
Potassium: 3.6 mmol/L (ref 3.5–5.1)
Sodium: 136 mmol/L (ref 135–145)
Total Bilirubin: 0.5 mg/dL (ref 0.3–1.2)
Total Protein: 6.7 g/dL (ref 6.5–8.1)

## 2019-12-12 LAB — TROPONIN I (HIGH SENSITIVITY)
Troponin I (High Sensitivity): 14 ng/L (ref <18)
Troponin I (High Sensitivity): 16 ng/L (ref <18)

## 2019-12-12 LAB — MAGNESIUM: Magnesium: 2 mg/dL (ref 1.7–2.4)

## 2019-12-12 LAB — C-REACTIVE PROTEIN: CRP: 1.1 mg/dL — ABNORMAL HIGH (ref <1.0)

## 2019-12-12 LAB — FIBRIN DERIVATIVES D-DIMER (ARMC ONLY): Fibrin derivatives D-dimer (ARMC): 456.43 ng/mL (FEU) (ref 0.00–499.00)

## 2019-12-12 LAB — GLUCOSE, CAPILLARY: Glucose-Capillary: 159 mg/dL — ABNORMAL HIGH (ref 70–99)

## 2019-12-12 LAB — FERRITIN: Ferritin: 194 ng/mL (ref 11–307)

## 2019-12-12 MED ORDER — SODIUM CHLORIDE 0.9 % IV SOLN
INTRAVENOUS | Status: DC | PRN
  Administered 2019-12-12 – 2019-12-13 (×2): 250 mL via INTRAVENOUS

## 2019-12-12 MED ORDER — AMLODIPINE BESYLATE 5 MG PO TABS
5.0000 mg | ORAL_TABLET | Freq: Every day | ORAL | Status: DC
  Administered 2019-12-12 – 2019-12-15 (×4): 5 mg via ORAL
  Filled 2019-12-12 (×4): qty 1

## 2019-12-12 NOTE — Plan of Care (Signed)
  Problem: Pain Managment: Goal: General experience of comfort will improve Outcome: Progressing   Problem: Safety: Goal: Ability to remain free from injury will improve Outcome: Progressing   

## 2019-12-12 NOTE — Plan of Care (Signed)
  Problem: Clinical Measurements: Goal: Respiratory complications will improve Outcome: Progressing   Problem: Safety: Goal: Ability to remain free from injury will improve Outcome: Progressing   

## 2019-12-12 NOTE — Progress Notes (Signed)
PROGRESS NOTE    Morgan Dennis  [id number] DOB: 24-Jun-1934 DOA: 12/09/2019  PCP: Jerrol Banana., MD    LOS - 2   Brief Narrative:  85 y.o.femalewith medical history significant ofrheumatoid arthritis, hypertension, Parkinson's disease, dementia, restless leg syndrome and GERD whose husband was diagnosed with COVID-19 and is currently admitted in the hospital. Home health nurse visit found patient hypoxic with oxygen sats in the 80s. Had been havnig fever and chills. In the ED, requiring 2 L/min oxygen. Tested positive for COVID-19. Chest x-ray showed bilateral infiltrates. Admitted to the hospital with acute respiratory failure with hypoxemia secondary to COVID-19 pneumonia.  Started on Remdesivir, Decadron, vitamin C and zinc.  Requiring 2L/min supplemental oxygen.  Subjective 12/25: Patient reports that she does not feel well today.  Does not feel like eating anything.  Denies abdominal pain, nausea or vomiting, just very poor appetite.  No acute events reported  Assessment & Plan:   Principal Problem:   Acute respiratory failure due to COVID-19 [facility]) Active Problems:   Rheumatoid arthritis ([place])   Parkinson's disease ([place])   Acute respiratory failure with hypoxia (HCC)   Thrombocytopenia (HCC)   Dementia without behavioral disturbance (HCC)   Acute Respiratory Failure secondary to COVID-19 Pneumonia --cont Remdesivir per pharmacy protocol - last day 12/27 --cont Decadron IV --maintain airborne and contact precautions --supplemental oxygen, maintain o2 sat > 90% --cont Ventolin inhaler --cont vitamin C and zinc --cont Tussionex and Robitussin   Rheumatoid arthritis Outpatient regimen appears to be methotrexate every Wednesday and abatacept  Parkinson's disease  Dementia without behavioral disturbance -Continue home meds: Sinemet, Aricept, Namenda, ropinirole  Peripheral neuropathy -Continue gabapentin, Cymbalta  Thrombocytopenia Suspect this  is secondary to acute infection. -Monitor CBC   DVT prophylaxis: Lovenox   Code Status: DNR  Family Communication: Son updated by phone Disposition Plan: Pending further clinical improvement expect discharge home, weaning oxygen.  PT eval pending   Consultants:   None  Procedures:   None  Antimicrobials:   None    Objective: Vitals:   12/11/19 1556 12/11/19 2001 12/12/19 0458 12/12/19 0749  BP: (!) 168/91 (!) 157/76 (!) 156/71 (!) 143/78  Pulse: 88 81 69 70  Resp: 19 19 19 18   Temp: 97.9 F (36.6 C) 98.2 F (36.8 C) 97.9 F (36.6 C) 98.4 F (36.9 C)  TempSrc:  Oral Oral   SpO2: 100% 96% 99% 96%  Weight:   73.8 kg   Height:        Intake/Output Summary (Last 24 hours) at 12/12/2019 1129 Last data filed at 12/12/2019 [id number] Gross per 24 hour  Intake --  Output 1635 ml  Net -1635 ml   Filed Weights   12/09/19 1756 12/11/19 0427 12/12/19 0458  Weight: 80.7 kg 73.3 kg 73.8 kg    Examination:  General exam: awake, alert, no acute distress, mildly ill-appearing HEENT: moist mucus membranes, hearing impairment Respiratory system: clear to auscultation bilaterally, no wheezes, rales or rhonchi, normal respiratory effort. Cardiovascular system: normal S1/S2, RRR, no JVD, murmurs, rubs, gallops, no pedal edema.   Gastrointestinal system: soft, non-tender, non-distended abdomen, no organomegaly or masses felt, normal bowel sounds. Central nervous system: alert and oriented x3. no gross focal neurologic deficits, normal speech Extremities: moves all, no cyanosis, normal tone, bony hypertrophy of hand joints  Skin: dry, intact, normal temperature Psychiatry: normal mood, congruent affect    Data Reviewed: I have personally reviewed following labs and imaging studies  CBC: Recent Labs  Lab 12/09/19  1800 12/10/19 0527 12/11/19 0649 12/12/19 0054  WBC 6.0 6.0 5.8 4.5  NEUTROABS  --   --  3.0 2.3  HGB 12.6 12.1 11.9* 12.8  HCT 38.8 35.1* 35.0* 36.8   MCV 96.3 90.5 91.6 89.5  PLT 117* 107* 119* 123456*   Basic Metabolic Panel: Recent Labs  Lab 12/09/19 1800 12/10/19 0527 12/11/19 0649 12/11/19 2331 12/12/19 0054  NA 141 138 140 137 136  K 4.8 3.8 3.8 4.0 3.6  CL 105 106 104 101 101  CO2 27 22 27 27 28   GLUCOSE 120* 98 92 110* 104*  BUN 29* 25* 18 19 19   CREATININE 0.87 0.56 0.61 0.51 0.50  CALCIUM 9.3 8.4* 8.4* 8.8* 9.0  MG  --   --   --  2.0  --    GFR: Estimated Creatinine Clearance: 52.4 mL/min (by C-G formula based on SCr of 0.5 mg/dL). Liver Function Tests: Recent Labs  Lab 12/10/19 0527 12/11/19 0649 12/12/19 0054  AST 25 25 24   ALT 21 6 <5  ALKPHOS 74 66 70  BILITOT 0.7 0.6 0.5  PROT 6.4* 6.1* 6.7  ALBUMIN 3.3* 3.2* 3.5   No results for input(s): LIPASE, AMYLASE in the last 168 hours. No results for input(s): AMMONIA in the last 168 hours. Coagulation Profile: No results for input(s): INR, PROTIME in the last 168 hours. Cardiac Enzymes: No results for input(s): CKTOTAL, CKMB, CKMBINDEX, TROPONINI in the last 168 hours. BNP (last 3 results) No results for input(s): PROBNP in the last 8760 hours. HbA1C: No results for input(s): HGBA1C in the last 72 hours. CBG: No results for input(s): GLUCAP in the last 168 hours. Lipid Profile: No results for input(s): CHOL, HDL, LDLCALC, TRIG, CHOLHDL, LDLDIRECT in the last 72 hours. Thyroid Function Tests: No results for input(s): TSH, T4TOTAL, FREET4, T3FREE, THYROIDAB in the last 72 hours. Anemia Panel: Recent Labs    12/11/19 0649 12/12/19 0054  FERRITIN 168 194   Sepsis Labs: Recent Labs  Lab 12/09/19 1800  PROCALCITON <0.10    No results found for this or any previous visit (from the past 240 hour(s)).       Radiology Studies: No results found.      Scheduled Meds: . albuterol  2 puff Inhalation Q6H  . vitamin C  500 mg Oral Daily  . carbidopa-levodopa  1 tablet Oral QHS  . carbidopa-levodopa  1.5 tablet Oral TID  . cholecalciferol   2,000 Units Oral Daily  . darifenacin  7.5 mg Oral Daily  . dexamethasone (DECADRON) injection  6 mg Intravenous Q24H  . donepezil  10 mg Oral Daily  . DULoxetine  60 mg Oral Daily  . enoxaparin (LOVENOX) injection  40 mg Subcutaneous Q24H  . folic acid  1 mg Oral Daily  . gabapentin  600 mg Oral BID  . Melatonin  5 mg Oral QHS  . memantine  10 mg Oral BID  . multivitamin with minerals  1 tablet Oral Daily  . rOPINIRole  0.25 mg Oral QID  . vitamin E  400 Units Oral Daily  . zinc sulfate  220 mg Oral Daily   Continuous Infusions: . sodium chloride 250 mL (12/12/19 0846)  . remdesivir 100 mg in NS 100 mL 100 mg (12/12/19 0852)     LOS: 2 days    Time spent: 30-35 minutes    Ezekiel Slocumb, DO [facility]   If 7PM-7AM, please contact night-coverage [email or web address] Password [email or web address] 12/12/2019, 11:29 AM

## 2019-12-12 NOTE — TOC Initial Note (Signed)
Transition of Care [facility]) - Initial/Assessment Note    Patient Details  Name: Morgan Dennis MRN: [id number] Date of Birth: June 20, 1934  [facility] CM/SW Contact:    Ross Ludwig, LCSW Phone Number: 12/12/2019, 12:54 PM  Clinical Narrative:                 Patient is an 85 year old female who is alert and oriented x2.  Due to patient being Covid + assessment completed by speaking to patient's son [name] and reviewing chart.  Patient is married and lives with her husband.  Patient is currently receiving home health services through [facility], receiving nursing and PT.  Patient's son reports that they are satisfied with patient's care with the home health agency, and they would like to continue with services.  Patient's family did not express any other concerns or issues with home health services being continued.  CSW to continue to follow patient's progress throughout discharge planning.   Expected Discharge Plan: [place] Barriers to Discharge: Continued Medical Work up   Patient Goals and CMS Choice Patient states their goals for this hospitalization and ongoing recovery are:: To return back home with home health. CMS Medicare.gov Compare Post Acute Care list provided to:: Patient Represenative (must comment) Choice offered to / list presented to : Adult Children  Expected Discharge Plan and Services Expected Discharge Plan: [place] In-house Referral: Clinical Social Work   Post Acute Care Choice: [place] arrangements for the past 2 months: [place]: RN, PT [place] Agency: [place] Date [place]: 12/12/19 Time [place]: 1215 Representative spoke with at [place]: Cecille Rubin and Malachy Mood  Prior Living Arrangements/Services Living arrangements for the past 2 months: [place] with:: Spouse Patient language and need for  interpreter reviewed:: Yes Do you feel safe going back to the place where you live?: Yes      Need for Family Participation in Patient Care: Yes (Comment) Care giver support system in place?: Yes (comment) Current home services: Home PT, Home RN Criminal Activity/Legal Involvement Pertinent to Current Situation/Hospitalization: No - Comment as needed  Activities of Daily Living Home Assistive Devices/Equipment: Walker (specify type) ADL Screening (condition at time of admission) Patient's cognitive ability adequate to safely complete daily activities?: Yes Is the patient deaf or have difficulty hearing?: No Does the patient have difficulty seeing, even when wearing glasses/contacts?: No Does the patient have difficulty concentrating, remembering, or making decisions?: Yes Patient able to express need for assistance with ADLs?: Yes Does the patient have difficulty dressing or bathing?: Yes Independently performs ADLs?: No Communication: Independent Dressing (OT): Needs assistance Is this a change from baseline?: Pre-admission baseline Grooming: Needs assistance Is this a change from baseline?: Pre-admission baseline Feeding: Needs assistance Is this a change from baseline?: Pre-admission baseline Bathing: Needs assistance Is this a change from baseline?: Pre-admission baseline Toileting: Needs assistance Is this a change from baseline?: Pre-admission baseline In/Out Bed: Needs assistance Is this a change from baseline?: Pre-admission baseline Walks in Home: Needs assistance Is this a change from baseline?: Pre-admission baseline Does the patient have difficulty walking or climbing stairs?: Yes Weakness of Legs: Both Weakness of Arms/Hands: None  Permission Sought/Granted Permission sought to share information with : Case Manager, Family  Supports Permission granted to share information with : Yes, Verbal Permission Granted  Share Information with NAME: Luckey,A.J. Son    [phone]  Permission granted to share info w AGENCY: [facility]        Emotional Assessment Appearance:: Appears stated age   Affect (typically observed): Appropriate, Calm, Stable Orientation: : Oriented to Self, Oriented to Place Alcohol / Substance Use: Not Applicable Psych Involvement: No (comment)  Admission diagnosis:  Weakness [R53.1] Hypoxia [R09.02] Acute respiratory failure due to COVID-19 ([place]) [U07.1, J96.00] COVID-19 [U07.1] Patient Active Problem List   Diagnosis Date Noted  . Acute respiratory failure due to COVID-19 ([place]) 12/10/2019  . Acute respiratory failure with hypoxia ([place]) 10/19/2019  . Thrombocytopenia ([place]) 10/19/2019  . Dementia without behavioral disturbance ([place]) 10/19/2019  . Aortic atherosclerosis ([place]) 10/19/2019  . Right upper lobe pneumonia 10/17/2019  . Sepsis ([place]) 06/16/2019  . Parkinson's disease ([place]) 04/23/2017  . Skin cancer 02/12/2017  . Chronic bronchitis ([place]) 01/02/2017  . Frequency of urination and polyuria 10/12/2015  . Upper GI bleed 08/18/2015  . Adaptation reaction 04/30/2015  . Malaise and fatigue 04/30/2015  . Affective disorder, major 04/30/2015  . Bad memory 04/30/2015  . Mild cognitive disorder 04/30/2015  . Fungal infection of toenail 04/30/2015  . Arthritis, degenerative 04/30/2015  . OP (osteoporosis) 04/30/2015  . Arthritis or polyarthritis, rheumatoid ([place]) 04/30/2015  . Avitaminosis D 05/12/2014  . Trigger finger 05/12/2014  . Rheumatoid arthritis ([place]) 03/17/2014   PCP:  Jerrol Banana., MD Pharmacy:   [facility] [address] [place] [address] Phone: [phone] Fax: [place], [place] [address] [place] [address] Phone: [phone] Fax: [place], [place] [address] Phone: [phone]  Fax: [phone]     Social Determinants of Health (SDOH) Interventions    Readmission Risk Interventions No flowsheet data found.

## 2019-12-13 DIAGNOSIS — M069 Rheumatoid arthritis, unspecified: Secondary | ICD-10-CM

## 2019-12-13 LAB — CBC WITH DIFFERENTIAL/PLATELET
Abs Immature Granulocytes: 0.02 10*3/uL (ref 0.00–0.07)
Basophils Absolute: 0 10*3/uL (ref 0.0–0.1)
Basophils Relative: 0 %
Eosinophils Absolute: 0 10*3/uL (ref 0.0–0.5)
Eosinophils Relative: 0 %
HCT: 37.1 % (ref 36.0–46.0)
Hemoglobin: 12.8 g/dL (ref 12.0–15.0)
Immature Granulocytes: 0 %
Lymphocytes Relative: 29 %
Lymphs Abs: 1.8 10*3/uL (ref 0.7–4.0)
MCH: 30.9 pg (ref 26.0–34.0)
MCHC: 34.5 g/dL (ref 30.0–36.0)
MCV: 89.6 fL (ref 80.0–100.0)
Monocytes Absolute: 0.9 10*3/uL (ref 0.1–1.0)
Monocytes Relative: 15 %
Neutro Abs: 3.3 10*3/uL (ref 1.7–7.7)
Neutrophils Relative %: 56 %
Platelets: 163 10*3/uL (ref 150–400)
RBC: 4.14 MIL/uL (ref 3.87–5.11)
RDW: 14 % (ref 11.5–15.5)
WBC: 6.1 10*3/uL (ref 4.0–10.5)
nRBC: 0 % (ref 0.0–0.2)

## 2019-12-13 LAB — COMPREHENSIVE METABOLIC PANEL
ALT: 6 U/L (ref 0–44)
AST: 25 U/L (ref 15–41)
Albumin: 3.5 g/dL (ref 3.5–5.0)
Alkaline Phosphatase: 69 U/L (ref 38–126)
Anion gap: 8 (ref 5–15)
BUN: 18 mg/dL (ref 8–23)
CO2: 28 mmol/L (ref 22–32)
Calcium: 8.6 mg/dL — ABNORMAL LOW (ref 8.9–10.3)
Chloride: 101 mmol/L (ref 98–111)
Creatinine, Ser: 0.59 mg/dL (ref 0.44–1.00)
GFR calc Af Amer: >60 mL/min (ref >60)
GFR calc non Af Amer: >60 mL/min (ref >60)
Glucose, Bld: 97 mg/dL (ref 70–99)
Potassium: 3.8 mmol/L (ref 3.5–5.1)
Sodium: 137 mmol/L (ref 135–145)
Total Bilirubin: 0.5 mg/dL (ref 0.3–1.2)
Total Protein: 6.5 g/dL (ref 6.5–8.1)

## 2019-12-13 LAB — FIBRIN DERIVATIVES D-DIMER (ARMC ONLY): Fibrin derivatives D-dimer (ARMC): 336.38 ng/mL (FEU) (ref 0.00–499.00)

## 2019-12-13 LAB — C-REACTIVE PROTEIN: CRP: 0.8 mg/dL (ref <1.0)

## 2019-12-13 LAB — FERRITIN: Ferritin: 214 ng/mL (ref 11–307)

## 2019-12-13 MED ORDER — AMLODIPINE BESYLATE 5 MG PO TABS: 5.0000 mg | ORAL_TABLET | Freq: Every day | ORAL | 1 refills | Status: DC

## 2019-12-13 NOTE — Care Management (Addendum)
RN CM: Reviewed case with nurse and patient was planning discharge home today however family had discussion with MD and decided patient needed more time inpatient. Husband was discharged yesterday and son is assisting with care for both of them. CM notified [facility] of pending discharge and need for home health. Malachy Mood with [facility] agency states agency will admit patient and husband at discharge. Husband and wife are sharing a walker and will need one at discharge.

## 2019-12-13 NOTE — Progress Notes (Signed)
PROGRESS NOTE    Morgan Dennis  [id number] DOB: 08/24/1934 DOA: 12/09/2019  PCP: Jerrol Banana., MD    LOS - 3   Brief Narrative:  85 y.o.femalewith medical history significant ofrheumatoid arthritis, hypertension, Parkinson's disease, dementia, restless leg syndrome and GERD whose husband was diagnosed with COVID-19 and is currentlyadmittedin the hospital.Home health nursevisitfoundpatienthypoxic with oxygen sats in the 77s. Had been havnigfever and chills. In the ED,requiring 2 L/minoxygen.Testedpositive for COVID-19. Chest x-ray showed bilateral infiltrates.Admitted to the hospital with acute respiratory failure with hypoxemia secondary to COVID-19 pneumonia.  Started on Remdesivir, Decadron, vitamin C and zinc.  Was requiring 2L/min supplemental oxygen, now weaned to room air.  Subjective 12/26: Patient seen this AM.  No acute events reported overnight.  Patient reports feeling well today.  States she continues to just feel tired and a little bit weak, not able to walk as well as she does normally, says she feels unsteady.  Denies fevers or chills, chest pain, shortness of breath, nausea vomiting or diarrhea.  Assessment & Plan:   Principal Problem:   Acute respiratory failure due to COVID-19 [facility]) Active Problems:   Rheumatoid arthritis ([place])   Parkinson's disease ([place])   Acute respiratory failure with hypoxia ([facility])   Thrombocytopenia ([facility])   Dementia without behavioral disturbance ([facility])   Acute Respiratory Failure secondary to COVID-19 Pneumonia --contRemdesivir per pharmacy protocol - last day 12/27 --contDecadron IV --maintain airborne and contact precautions --supplemental oxygen, maintain o2 sat > 90% --cont Ventolininhaler --contvitamin C and zinc --contTussionex and Robitussin  Rheumatoid arthritis Outpatient regimen appears to be methotrexate every Wednesday and abatacept  Parkinson's disease  Dementia without behavioral  disturbance -Continue home meds: Sinemet, Aricept, Namenda, ropinirole  Peripheral neuropathy -Continue gabapentin, Cymbalta  Thrombocytopenia Suspect this is secondary to acute infection. -Monitor CBC   DVT prophylaxis:Lovenox Code Status: DNR Family Communication:Son updated by phone Disposition Plan: Discharge home with home health, and care of son(s).  Expect ready tomorrow 12/27 after last remdesivir dose.       Consultants:  None  Procedures:  None  Antimicrobials:  None   Objective: Vitals:   12/13/19 0600 12/13/19 0638 12/13/19 0805 12/13/19 0837  BP:  (!) 156/75 (!) 151/71   Pulse:  72 70   Resp:   16   Temp:   97.8 F (36.6 C)   TempSrc:   Oral   SpO2: 94%  99% 95%  Weight:      Height:        Intake/Output Summary (Last 24 hours) at 12/13/2019 1331 Last data filed at 12/13/2019 0530 Gross per 24 hour  Intake --  Output 1725 ml  Net -1725 ml   Filed Weights   12/11/19 0427 12/12/19 0458 12/13/19 0354  Weight: 73.3 kg 73.8 kg 73 kg    Examination:  General exam: awake, alert, no acute distress HEENT: moist mucus membranes, hearing impairment Respiratory system: clear but diminished bilaterally, no wheezes, rales or rhonchi, normal respiratory effort.   Cardiovascular system: normal [id number], RRR, 2/6 systolic murmur, no pedal edema.   Gastrointestinal system: soft, non-tender, non-distended abdomen Central nervous system: alert and oriented x2. no gross focal neurologic deficits, normal speech Extremities: moves all, no cyanosis, normal tone Psychiatry: normal mood, congruent affect, abnormal judgement and insight (baseline dementia)   Data Reviewed: I have personally reviewed following labs and imaging studies  CBC: Recent Labs  Lab 12/09/19 1800 12/10/19 0527 12/11/19 0649 12/12/19 0054 12/13/19 0509  WBC 6.0 6.0 5.8 4.5 6.1  NEUTROABS  --   --  3.0 2.3 3.3  HGB 12.6 12.1 11.9* 12.8 12.8  HCT 38.8 35.1* 35.0*  36.8 37.1  MCV 96.3 90.5 91.6 89.5 89.6  PLT 117* 107* 119* 149* [id number]   Basic Metabolic Panel: Recent Labs  Lab 12/10/19 0527 12/11/19 0649 12/11/19 2331 12/12/19 0054 12/13/19 0509  NA 138 140 137 136 137  K 3.8 3.8 4.0 3.6 3.8  CL 106 104 101 101 101  CO2 22 27 27 28 28   GLUCOSE 98 92 110* 104* 97  BUN 25* 18 19 19 18   CREATININE 0.56 0.61 0.51 0.50 0.59  CALCIUM 8.4* 8.4* 8.8* 9.0 8.6*  MG  --   --  2.0  --   --    GFR: Estimated Creatinine Clearance: 52.2 mL/min (by C-G formula based on SCr of 0.59 mg/dL). Liver Function Tests: Recent Labs  Lab 12/10/19 0527 12/11/19 0649 12/12/19 0054 12/13/19 0509  AST 25 25 24 25   ALT 21 6 <5 6  ALKPHOS 74 66 70 69  BILITOT 0.7 0.6 0.5 0.5  PROT 6.4* 6.1* 6.7 6.5  ALBUMIN 3.3* 3.2* 3.5 3.5   No results for input(s): LIPASE, AMYLASE in the last 168 hours. No results for input(s): AMMONIA in the last 168 hours. Coagulation Profile: No results for input(s): INR, PROTIME in the last 168 hours. Cardiac Enzymes: No results for input(s): CKTOTAL, CKMB, CKMBINDEX, TROPONINI in the last 168 hours. BNP (last 3 results) No results for input(s): PROBNP in the last 8760 hours. HbA1C: No results for input(s): HGBA1C in the last 72 hours. CBG: Recent Labs  Lab 12/12/19 1941  GLUCAP 159*   Lipid Profile: No results for input(s): CHOL, HDL, LDLCALC, TRIG, CHOLHDL, LDLDIRECT in the last 72 hours. Thyroid Function Tests: No results for input(s): TSH, T4TOTAL, FREET4, T3FREE, THYROIDAB in the last 72 hours. Anemia Panel: Recent Labs    12/12/19 0054 12/13/19 0509  FERRITIN 194 214   Sepsis Labs: Recent Labs  Lab 12/09/19 1800  PROCALCITON <0.10    No results found for this or any previous visit (from the past 240 hour(s)).       Radiology Studies: No results found.      Scheduled Meds: . albuterol  2 puff Inhalation Q6H  . amLODipine  5 mg Oral Daily  . vitamin C  500 mg Oral Daily  . carbidopa-levodopa  1  tablet Oral QHS  . carbidopa-levodopa  1.5 tablet Oral TID  . cholecalciferol  2,000 Units Oral Daily  . darifenacin  7.5 mg Oral Daily  . dexamethasone (DECADRON) injection  6 mg Intravenous Q24H  . donepezil  10 mg Oral Daily  . DULoxetine  60 mg Oral Daily  . enoxaparin (LOVENOX) injection  40 mg Subcutaneous Q24H  . folic acid  1 mg Oral Daily  . gabapentin  600 mg Oral BID  . Melatonin  5 mg Oral QHS  . memantine  10 mg Oral BID  . multivitamin with minerals  1 tablet Oral Daily  . rOPINIRole  0.25 mg Oral QID  . vitamin E  400 Units Oral Daily  . zinc sulfate  220 mg Oral Daily   Continuous Infusions: . sodium chloride 250 mL (12/13/19 0814)  . remdesivir 100 mg in NS 100 mL 100 mg (12/13/19 0817)     LOS: 3 days    Time spent: 30-35 minutes    Ezekiel Slocumb, DO [facility]   If 7PM-7AM, please contact night-coverage [email or web address] Password  [email or web address] 12/13/2019, 1:31 PM

## 2019-12-13 NOTE — Progress Notes (Signed)
Patient on room air since this morning with oxygen level WDL (see flowsheet). Ambulated patient in the room with walker with oxygen sat at 92% on room air.

## 2019-12-14 LAB — COMPREHENSIVE METABOLIC PANEL
ALT: 5 U/L (ref 0–44)
AST: 21 U/L (ref 15–41)
Albumin: 3.3 g/dL — ABNORMAL LOW (ref 3.5–5.0)
Alkaline Phosphatase: 62 U/L (ref 38–126)
Anion gap: 10 (ref 5–15)
BUN: 21 mg/dL (ref 8–23)
CO2: 27 mmol/L (ref 22–32)
Calcium: 8.7 mg/dL — ABNORMAL LOW (ref 8.9–10.3)
Chloride: 100 mmol/L (ref 98–111)
Creatinine, Ser: 0.65 mg/dL (ref 0.44–1.00)
GFR calc Af Amer: >60 mL/min (ref >60)
GFR calc non Af Amer: >60 mL/min (ref >60)
Glucose, Bld: 98 mg/dL (ref 70–99)
Potassium: 4.6 mmol/L (ref 3.5–5.1)
Sodium: 137 mmol/L (ref 135–145)
Total Bilirubin: 0.8 mg/dL (ref 0.3–1.2)
Total Protein: 6.4 g/dL — ABNORMAL LOW (ref 6.5–8.1)

## 2019-12-14 LAB — CBC WITH DIFFERENTIAL/PLATELET
Abs Immature Granulocytes: 0.03 10*3/uL (ref 0.00–0.07)
Basophils Absolute: 0 10*3/uL (ref 0.0–0.1)
Basophils Relative: 0 %
Eosinophils Absolute: 0 10*3/uL (ref 0.0–0.5)
Eosinophils Relative: 0 %
HCT: 38.6 % (ref 36.0–46.0)
Hemoglobin: 12.7 g/dL (ref 12.0–15.0)
Immature Granulocytes: 0 %
Lymphocytes Relative: 28 %
Lymphs Abs: 1.9 10*3/uL (ref 0.7–4.0)
MCH: 30.7 pg (ref 26.0–34.0)
MCHC: 32.9 g/dL (ref 30.0–36.0)
MCV: 93.2 fL (ref 80.0–100.0)
Monocytes Absolute: 1 10*3/uL (ref 0.1–1.0)
Monocytes Relative: 14 %
Neutro Abs: 4.1 10*3/uL (ref 1.7–7.7)
Neutrophils Relative %: 58 %
Platelets: 188 10*3/uL (ref 150–400)
RBC: 4.14 MIL/uL (ref 3.87–5.11)
RDW: 14 % (ref 11.5–15.5)
WBC: 7 10*3/uL (ref 4.0–10.5)
nRBC: 0 % (ref 0.0–0.2)

## 2019-12-14 LAB — C-REACTIVE PROTEIN: CRP: 0.9 mg/dL (ref <1.0)

## 2019-12-14 LAB — FERRITIN: Ferritin: 191 ng/mL (ref 11–307)

## 2019-12-14 LAB — FIBRIN DERIVATIVES D-DIMER (ARMC ONLY): Fibrin derivatives D-dimer (ARMC): 289.27 ng/mL (FEU) (ref 0.00–499.00)

## 2019-12-14 MED ORDER — GUAIFENESIN-DM 100-10 MG/5ML PO SYRP: 10.0000 mL | ORAL_SOLUTION | ORAL | 0 refills | Status: DC | PRN

## 2019-12-14 MED ORDER — ZINC SULFATE 220 (50 ZN) MG PO CAPS: 220.0000 mg | ORAL_CAPSULE | Freq: Every day | ORAL | 0 refills | Status: DC

## 2019-12-14 MED ORDER — ASCORBIC ACID 500 MG PO TABS: 500.0000 mg | ORAL_TABLET | Freq: Every day | ORAL | 0 refills | Status: DC

## 2019-12-14 NOTE — TOC Transition Note (Signed)
Transition of Care [facility]) - CM/SW Discharge Note   Patient Details  Name: [name] MRN: [id number] Date of Birth: 1934/10/17  Transition of Care [facility]) CM/SW Contact:  Ross Ludwig, LCSW Phone Number: 12/14/2019, 12:47 PM   Clinical Narrative:    CSW was informed that patient will need oxygen for discharge.  CSW contacted [employer] and spoke to [place] and she told CSW to fax referral to [place] to [place], [id number].  CSW faxed orders, facesheet, H and P, and progress note with qualifying stats.  They will deliver the oxygen to hospital and contact son in regards to delivery of oxygen.  1:14pm  CSW was just updated by bedside nurse, patient is not discharging today now per MD.  CSW to update DME equipment rep.   Final next level of care: [place] Barriers to Discharge: Barriers Resolved   Patient Goals and CMS Choice Patient states their goals for this hospitalization and ongoing recovery are:: To return back home with home health. CMS Medicare.gov Compare Post Acute Care list provided to:: Patient Choice offered to / list presented to : Patient  Discharge Placement                       Discharge Plan and Services In-house Referral: Clinical Social Work   Post Acute Care Choice: Home Health          DME Arranged: Oxygen DME Agency: [employer] Date DME Agency Contacted: 12/14/19 Time DME Agency Contacted: [phone] Representative spoke with at DME Agency: Margreta Journey HH Arranged: PT, OT, Nurse's Aide [place] Agency: [place] Date [place]: 12/14/19 Time [place]: 1247 Representative spoke with at [place]: [place] ([place]) Interventions     Readmission Risk Interventions No flowsheet data found.

## 2019-12-14 NOTE — Progress Notes (Signed)
Pt on room air and oxygen saturation went down to 87 and stayed there. Pt is now on 2L [place] sating 93%.

## 2019-12-14 NOTE — Discharge Instructions (Signed)
COVID-19 COVID-19 is a respiratory infection that is caused by a virus called severe acute respiratory syndrome coronavirus 2 (SARS-CoV-2). The disease is also known as coronavirus disease or novel coronavirus. In some people, the virus may not cause any symptoms. In others, it may cause a serious infection. The infection can get worse quickly and can lead to complications, such as:  Pneumonia, or infection of the lungs.  Acute respiratory distress syndrome or ARDS. This is fluid build-up in the lungs.  Acute respiratory failure. This is a condition in which there is not enough oxygen passing from the lungs to the body.  Sepsis or septic shock. This is a serious bodily reaction to an infection.  Blood clotting problems.  Secondary infections due to bacteria or fungus. The virus that causes COVID-19 is contagious. This means that it can spread from person to person through droplets from coughs and sneezes (respiratory secretions). What are the causes? This illness is caused by a virus. You may catch the virus by:  Breathing in droplets from an infected person's cough or sneeze.  Touching something, like a table or a doorknob, that was exposed to the virus (contaminated) and then touching your mouth, nose, or eyes. What increases the risk? Risk for infection You are more likely to be infected with this virus if you:  Live in or travel to an area with a COVID-19 outbreak.  Come in contact with a sick person who recently traveled to an area with a COVID-19 outbreak.  Provide care for or live with a person who is infected with COVID-19. Risk for serious illness You are more likely to become seriously ill from the virus if you:  Are 35 years of age or older.  Have a long-term disease that lowers your body's ability to fight infection (immunocompromised).  Live in a nursing home or long-term care facility.  Have a long-term (chronic) disease such as: ? Chronic lung disease, including  chronic obstructive pulmonary disease or asthma ? Heart disease. ? Diabetes. ? Chronic kidney disease. ? Liver disease.  Are obese. What are the signs or symptoms? Symptoms of this condition can range from mild to severe. Symptoms may appear any time from 2 to 14 days after being exposed to the virus. They include:  A fever.  A cough.  Difficulty breathing.  Chills.  Muscle pains.  A sore throat.  Loss of taste or smell. Some people may also have stomach problems, such as nausea, vomiting, or diarrhea. Other people may not have any symptoms of COVID-19. How is this diagnosed? This condition may be diagnosed based on:  Your signs and symptoms, especially if: ? You live in an area with a COVID-19 outbreak. ? You recently traveled to or from an area where the virus is common. ? You provide care for or live with a person who was diagnosed with COVID-19.  A physical exam.  Lab tests, which may include: ? A nasal swab to take a sample of fluid from your nose. ? A throat swab to take a sample of fluid from your throat. ? A sample of mucus from your lungs (sputum). ? Blood tests.  Imaging tests, which may include, X-rays, CT scan, or ultrasound. How is this treated? At present, there is no medicine to treat COVID-19. Medicines that treat other diseases are being used on a trial basis to see if they are effective against COVID-19. Your health care provider will talk with you about ways to treat your symptoms. For most  people, the infection is mild and can be managed at home with rest, fluids, and over-the-counter medicines. Treatment for a serious infection usually takes places in a hospital intensive care unit (ICU). It may include one or more of the following treatments. These treatments are given until your symptoms improve.  Receiving fluids and medicines through an IV.  Supplemental oxygen. Extra oxygen is given through a tube in the nose, a face mask, or a  hood.  Positioning you to lie on your stomach (prone position). This makes it easier for oxygen to get into the lungs.  Continuous positive airway pressure (CPAP) or bi-level positive airway pressure (BPAP) machine. This treatment uses mild air pressure to keep the airways open. A tube that is connected to a motor delivers oxygen to the body.  Ventilator. This treatment moves air into and out of the lungs by using a tube that is placed in your windpipe.  Tracheostomy. This is a procedure to create a hole in the neck so that a breathing tube can be inserted.  Extracorporeal membrane oxygenation (ECMO). This procedure gives the lungs a chance to recover by taking over the functions of the heart and lungs. It supplies oxygen to the body and removes carbon dioxide. Follow these instructions at home: Lifestyle  If you are sick, stay home except to get medical care. Your health care provider will tell you how long to stay home. Call your health care provider before you go for medical care.  Rest at home as told by your health care provider.  Do not use any products that contain nicotine or tobacco, such as cigarettes, e-cigarettes, and chewing tobacco. If you need help quitting, ask your health care provider.  Return to your normal activities as told by your health care provider. Ask your health care provider what activities are safe for you. General instructions  Take over-the-counter and prescription medicines only as told by your health care provider.  Drink enough fluid to keep your urine pale yellow.  Keep all follow-up visits as told by your health care provider. This is important. How is this prevented?  There is no vaccine to help prevent COVID-19 infection. However, there are steps you can take to protect yourself and others from this virus. To protect yourself:   Do not travel to areas where COVID-19 is a risk. The areas where COVID-19 is reported change often. To identify  high-risk areas and travel restrictions, check the CDC travel website: FatFares.com.br  If you live in, or must travel to, an area where COVID-19 is a risk, take precautions to avoid infection. ? Stay away from people who are sick. ? Wash your hands often with soap and water for 20 seconds. If soap and water are not available, use an alcohol-based hand sanitizer. ? Avoid touching your mouth, face, eyes, or nose. ? Avoid going out in public, follow guidance from your state and local health authorities. ? If you must go out in public, wear a cloth face covering or face mask. ? Disinfect objects and surfaces that are frequently touched every day. This may include:  Counters and tables.  Doorknobs and light switches.  Sinks and faucets.  Electronics, such as phones, remote controls, keyboards, computers, and tablets. To protect others: If you have symptoms of COVID-19, take steps to prevent the virus from spreading to others.  If you think you have a COVID-19 infection, contact your health care provider right away. Tell your health care team that you think you may  have a COVID-19 infection.  Stay home. Leave your house only to seek medical care. Do not use public transport.  Do not travel while you are sick.  Wash your hands often with soap and water for 20 seconds. If soap and water are not available, use alcohol-based hand sanitizer.  Stay away from other members of your household. Let healthy household members care for children and pets, if possible. If you have to care for children or pets, wash your hands often and wear a mask. If possible, stay in your own room, separate from others. Use a different bathroom.  Make sure that all people in your household wash their hands well and often.  Cough or sneeze into a tissue or your sleeve or elbow. Do not cough or sneeze into your hand or into the air.  Wear a cloth face covering or face mask. Where to find more  information  Centers for Disease Control and Prevention: PurpleGadgets.be  World Health Organization: https://www.castaneda.info/ Contact a health care provider if:  You live in or have traveled to an area where COVID-19 is a risk and you have symptoms of the infection.  You have had contact with someone who has COVID-19 and you have symptoms of the infection. Get help right away if:  You have trouble breathing.  You have pain or pressure in your chest.  You have confusion.  You have bluish lips and fingernails.  You have difficulty waking from sleep.  You have symptoms that get worse. These symptoms may represent a serious problem that is an emergency. Do not wait to see if the symptoms will go away. Get medical help right away. Call your local emergency services (911 in the [place] Do not drive yourself to the hospital. Let the emergency medical personnel know if you think you have COVID-19. Summary  COVID-19 is a respiratory infection that is caused by a virus. It is also known as coronavirus disease or novel coronavirus. It can cause serious infections, such as pneumonia, acute respiratory distress syndrome, acute respiratory failure, or sepsis.  The virus that causes COVID-19 is contagious. This means that it can spread from person to person through droplets from coughs and sneezes.  You are more likely to develop a serious illness if you are 50 years of age or older, have a weak immunity, live in a nursing home, or have chronic disease.  There is no medicine to treat COVID-19. Your health care provider will talk with you about ways to treat your symptoms.  Take steps to protect yourself and others from infection. Wash your hands often and disinfect objects and surfaces that are frequently touched every day. Stay away from people who are sick and wear a mask if you are sick. This information is not intended to replace advice given to you by  your health care provider. Make sure you discuss any questions you have with your health care provider. Document Released: 01/09/2019 Document Revised: 05/01/2019 Document Reviewed: 01/09/2019 Elsevier Patient Education  2020 [place]: How to Protect Yourself and Others Know how it spreads  There is currently no vaccine to prevent coronavirus disease 2019 (COVID-19).  The best way to prevent illness is to avoid being exposed to this virus.  The virus is thought to spread mainly from person-to-person. ? Between people who are in close contact with one another (within about 6 feet). ? Through respiratory droplets produced when an infected person coughs, sneezes or talks. ? These droplets can land in  the mouths or noses of people who are nearby or possibly be inhaled into the lungs. ? Some recent studies have suggested that COVID-19 may be spread by people who are not showing symptoms. Everyone should Clean your hands often  Wash your hands often with soap and water for at least 20 seconds especially after you have been in a public place, or after blowing your nose, coughing, or sneezing.  If soap and water are not readily available, use a hand sanitizer that contains at least 60% alcohol. Cover all surfaces of your hands and rub them together until they feel dry.  Avoid touching your eyes, nose, and mouth with unwashed hands. Avoid close contact  Stay home if you are sick.  Avoid close contact with people who are sick.  Put distance between yourself and other people. ? Remember that some people without symptoms may be able to spread virus. ? This is especially important for people who are at higher risk of getting very GainPain.com.cy Cover your mouth and nose with a cloth face cover when around others  You could spread COVID-19 to others even if you do not feel sick.  Everyone should wear a  cloth face cover when they have to go out in public, for example to the grocery store or to pick up other necessities. ? Cloth face coverings should not be placed on young children under age 42, anyone who has trouble breathing, or is unconscious, incapacitated or otherwise unable to remove the mask without assistance.  The cloth face cover is meant to protect other people in case you are infected.  Do NOT use a facemask meant for a Dietitian.  Continue to keep about 6 feet between yourself and others. The cloth face cover is not a substitute for social distancing. Cover coughs and sneezes  If you are in a private setting and do not have on your cloth face covering, remember to always cover your mouth and nose with a tissue when you cough or sneeze or use the inside of your elbow.  Throw used tissues in the trash.  Immediately wash your hands with soap and water for at least 20 seconds. If soap and water are not readily available, clean your hands with a hand sanitizer that contains at least 60% alcohol. Clean and disinfect  Clean AND disinfect frequently touched surfaces daily. This includes tables, doorknobs, light switches, countertops, handles, desks, phones, keyboards, toilets, faucets, and sinks. RackRewards.fr  If surfaces are dirty, clean them: Use detergent or soap and water prior to disinfection.  Then, use a household disinfectant. You can see a list of EPA-registered household disinfectants here. michellinders.com 04/22/2019 This information is not intended to replace advice given to you by your health care provider. Make sure you discuss any questions you have with your health care provider. Document Released: 04/01/2019 Document Revised: 04/30/2019 Document Reviewed: 04/01/2019 Elsevier Patient Education  2020 [employer].   COVID-19 Frequently Asked Questions COVID-19 (coronavirus disease) is  an infection that is caused by a large family of viruses. Some viruses cause illness in people and others cause illness in animals like camels, cats, and bats. In some cases, the viruses that cause illness in animals can spread to humans. Where did the coronavirus come from? In December 2019, [place] told the [facility]) of several cases of lung disease (human respiratory illness). These cases were linked to an open seafood and livestock market in the city of [place]. The link to the seafood and  livestock market suggests that the virus may have spread from animals to humans. However, since that first outbreak in December, the virus has also been shown to spread from person to person. What is the name of the disease and the virus? Disease name Early on, this disease was called novel coronavirus. This is because scientists determined that the disease was caused by a new (novel) respiratory virus. The World Health Organization [facility]) has now named the disease COVID-19, or coronavirus disease. Virus name The virus that causes the disease is called severe acute respiratory syndrome coronavirus 2 (SARS-CoV-2). More information on disease and virus naming World Health Organization [facility]): www.who.int/emergencies/diseases/novel-coronavirus-2019/technical-guidance/naming-the-coronavirus-disease-(covid-2019)-and-the-virus-that-causes-it Who is at risk for complications from coronavirus disease? Some people may be at higher risk for complications from coronavirus disease. This includes older adults and people who have chronic diseases, such as heart disease, diabetes, and lung disease. If you are at higher risk for complications, take these extra precautions:  Avoid close contact with people who are sick or have a fever or cough. Stay at least 3-6 ft (1-2 m) away from them, if possible.  Wash your hands often with soap and water for at least 20 seconds.  Avoid touching your face, mouth, nose, or  eyes.  Keep supplies on hand at home, such as food, medicine, and cleaning supplies.  Stay home as much as possible.  Avoid social gatherings and travel. How does coronavirus disease spread? The virus that causes coronavirus disease spreads easily from person to person (is contagious). There are also cases of community-spread disease. This means the disease has spread to:  People who have no known contact with other infected people.  People who have not traveled to areas where there are known cases. It appears to spread from one person to another through droplets from coughing or sneezing. Can I get the virus from touching surfaces or objects? There is still a lot that we do not know about the virus that causes coronavirus disease. Scientists are basing a lot of information on what they know about similar viruses, such as:  Viruses cannot generally survive on surfaces for long. They need a human body (host) to survive.  It is more likely that the virus is spread by close contact with people who are sick (direct contact), such as through: ? Shaking hands or hugging. ? Breathing in respiratory droplets that travel through the air. This can happen when an infected person coughs or sneezes on or near other people.  It is less likely that the virus is spread when a person touches a surface or object that has the virus on it (indirect contact). The virus may be able to enter the body if the person touches a surface or object and then touches his or her face, eyes, nose, or mouth. Can a person spread the virus without having symptoms of the disease? It may be possible for the virus to spread before a person has symptoms of the disease, but this is most likely not the main way the virus is spreading. It is more likely for the virus to spread by being in close contact with people who are sick and breathing in the respiratory droplets of a sick person's cough or sneeze. What are the symptoms of  coronavirus disease? Symptoms vary from person to person and can range from mild to severe. Symptoms may include:  Fever.  Cough.  Tiredness, weakness, or fatigue.  Fast breathing or feeling short of breath. These symptoms can appear anywhere from  2 to 14 days after you have been exposed to the virus. If you develop symptoms, call your health care provider. People with severe symptoms may need hospital care. If I am exposed to the virus, how long does it take before symptoms start? Symptoms of coronavirus disease may appear anywhere from 2 to 14 days after a person has been exposed to the virus. If you develop symptoms, call your health care provider. Should I be tested for this virus? Your health care provider will decide whether to test you based on your symptoms, history of exposure, and your risk factors. How does a health care provider test for this virus? Health care providers will collect samples to send for testing. Samples may include:  Taking a swab of fluid from the nose.  Taking fluid from the lungs by having you cough up mucus (sputum) into a sterile cup.  Taking a blood sample.  Taking a stool or urine sample. Is there a treatment or vaccine for this virus? Currently, there is no vaccine to prevent coronavirus disease. Also, there are no medicines like antibiotics or antivirals to treat the virus. A person who becomes sick is given supportive care, which means rest and fluids. A person may also relieve his or her symptoms by using over-the-counter medicines that treat sneezing, coughing, and runny nose. These are the same medicines that a person takes for the common cold. If you develop symptoms, call your health care provider. People with severe symptoms may need hospital care. What can I do to protect myself and my family from this virus?     You can protect yourself and your family by taking the same actions that you would take to prevent the spread of other viruses.  Take the following actions:  Wash your hands often with soap and water for at least 20 seconds. If soap and water are not available, use alcohol-based hand sanitizer.  Avoid touching your face, mouth, nose, or eyes.  Cough or sneeze into a tissue, sleeve, or elbow. Do not cough or sneeze into your hand or the air. ? If you cough or sneeze into a tissue, throw it away immediately and wash your hands.  Disinfect objects and surfaces that you frequently touch every day.  Avoid close contact with people who are sick or have a fever or cough. Stay at least 3-6 ft (1-2 m) away from them, if possible.  Stay home if you are sick, except to get medical care. Call your health care provider before you get medical care.  Make sure your vaccines are up to date. Ask your health care provider what vaccines you need. What should I do if I need to travel? Follow travel recommendations from your local health authority, the CDC, and WHO. Travel information and advice  Centers for Disease Control and Prevention (CDC): BodyEditor.hu  World Health Organization [facility]): ThirdIncome.ca Know the risks and take action to protect your health  You are at higher risk of getting coronavirus disease if you are traveling to areas with an outbreak or if you are exposed to travelers from areas with an outbreak.  Wash your hands often and practice good hygiene to lower the risk of catching or spreading the virus. What should I do if I am sick? General instructions to stop the spread of infection  Wash your hands often with soap and water for at least 20 seconds. If soap and water are not available, use alcohol-based hand sanitizer.  Cough or sneeze into a  tissue, sleeve, or elbow. Do not cough or sneeze into your hand or the air.  If you cough or sneeze into a tissue, throw it away immediately and wash your hands.  Stay  home unless you must get medical care. Call your health care provider or local health authority before you get medical care.  Avoid public areas. Do not take public transportation, if possible.  If you can, wear a mask if you must go out of the house or if you are in close contact with someone who is not sick. Keep your home clean  Disinfect objects and surfaces that are frequently touched every day. This may include: ? Counters and tables. ? Doorknobs and light switches. ? Sinks and faucets. ? Electronics such as phones, remote controls, keyboards, computers, and tablets.  Wash dishes in hot, soapy water or use a dishwasher. Air-dry your dishes.  Wash laundry in hot water. Prevent infecting other household members  Let healthy household members care for children and pets, if possible. If you have to care for children or pets, wash your hands often and wear a mask.  Sleep in a different bedroom or bed, if possible.  Do not share personal items, such as razors, toothbrushes, deodorant, combs, brushes, towels, and washcloths. Where to find more information Centers for Disease Control and Prevention (CDC)  Information and news updates: https://www.butler-gonzalez.com/ World Health Organization [facility])  Information and news updates: MissExecutive.com.ee  Coronavirus health topic: https://www.castaneda.info/  Questions and answers on COVID-19: OpportunityDebt.at  Global tracker: who.sprinklr.com American Academy of Pediatrics (AAP)  Information for families: www.healthychildren.org/English/health-issues/conditions/chest-lungs/Pages/2019-Novel-Coronavirus.aspx The coronavirus situation is changing rapidly. Check your local health authority website or the CDC and [facility] websites for updates and news. When should I contact a health care provider?  Contact your health care provider if you have symptoms of an  infection, such as fever or cough, and you: ? Have been near anyone who is known to have coronavirus disease. ? Have come into contact with a person who is suspected to have coronavirus disease. ? Have traveled outside of the country. When should I get emergency medical care?  Get help right away by calling your local emergency services (911 in the [place] if you have: ? Trouble breathing. ? Pain or pressure in your chest. ? Confusion. ? Blue-tinged lips and fingernails. ? Difficulty waking from sleep. ? Symptoms that get worse. Let the emergency medical personnel know if you think you have coronavirus disease. Summary  A new respiratory virus is spreading from person to person and causing COVID-19 (coronavirus disease).  The virus that causes COVID-19 appears to spread easily. It spreads from one person to another through droplets from coughing or sneezing.  Older adults and those with chronic diseases are at higher risk of disease. If you are at higher risk for complications, take extra precautions.  There is currently no vaccine to prevent coronavirus disease. There are no medicines, such as antibiotics or antivirals, to treat the virus.  You can protect yourself and your family by washing your hands often, avoiding touching your face, and covering your coughs and sneezes. This information is not intended to replace advice given to you by your health care provider. Make sure you discuss any questions you have with your health care provider. Document Released: 04/01/2019 Document Revised: 04/01/2019 Document Reviewed: 04/01/2019 Elsevier Patient Education  [place].   Fatigue If you have fatigue, you feel tired all the time and have a lack of energy or a lack of  motivation. Fatigue may make it difficult to start or complete tasks because of exhaustion. In general, occasional or mild fatigue is often a normal response to activity or life. However, long-lasting (chronic) or  extreme fatigue may be a symptom of a medical condition. Follow these instructions at home: General instructions  Watch your fatigue for any changes.  Go to bed and get up at the same time every day.  Avoid fatigue by pacing yourself during the day and getting enough sleep at night.  Maintain a healthy weight. Medicines  Take over-the-counter and prescription medicines only as told by your health care provider.  Take a multivitamin, if told by your health care provider.  Do not use herbal or dietary supplements unless they are approved by your health care provider. Activity   Exercise regularly, as told by your health care provider.  Use or practice techniques to help you relax, such as yoga, tai chi, meditation, or massage therapy. Eating and drinking   Avoid heavy meals in the evening.  Eat a well-balanced diet, which includes lean proteins, whole grains, plenty of fruits and vegetables, and low-fat dairy products.  Avoid consuming too much caffeine.  Avoid the use of alcohol.  Drink enough fluid to keep your urine pale yellow. Lifestyle  Change situations that cause you stress. Try to keep your work and personal schedule in balance.  Do not use any products that contain nicotine or tobacco, such as cigarettes and e-cigarettes. If you need help quitting, ask your health care provider.  Do not use drugs. Contact a health care provider if:  Your fatigue does not get better.  You have a fever.  You suddenly lose or gain weight.  You have headaches.  You have trouble falling asleep or sleeping through the night.  You feel angry, guilty, anxious, or sad.  You are unable to have a bowel movement (constipation).  Your skin is dry.  You have swelling in your legs or another part of your body. Get help right away if:  You feel confused.  Your vision is blurry.  You feel faint or you pass out.  You have a severe headache.  You have severe pain in your  abdomen, your back, or the area between your waist and hips (pelvis).  You have chest pain, shortness of breath, or an irregular or fast heartbeat.  You are unable to urinate, or you urinate less than normal.  You have abnormal bleeding, such as bleeding from the rectum, vagina, nose, lungs, or nipples.  You vomit blood.  You have thoughts about hurting yourself or others. If you ever feel like you may hurt yourself or others, or have thoughts about taking your own life, get help right away. You can go to your nearest emergency department or call:  Your local emergency services (911 in the [place]  A suicide crisis helpline, such as the [place] at 321-265-1307. This is open 24 hours a day. Summary  If you have fatigue, you feel tired all the time and have a lack of energy or a lack of motivation.  Fatigue may make it difficult to start or complete tasks because of exhaustion.  Long-lasting (chronic) or extreme fatigue may be a symptom of a medical condition.  Exercise regularly, as told by your health care provider.  Change situations that cause you stress. Try to keep your work and personal schedule in balance. This information is not intended to replace advice given to you by your health  care provider. Make sure you discuss any questions you have with your health care provider. Document Released: 10/01/2007 Document Revised: 03/27/2019 Document Reviewed: 08/29/2017 Elsevier Patient Education  2020 [employer].   Hypoxia Hypoxia is a condition that happens when there is a lack of oxygen in the body's tissues and organs. When there is not enough oxygen, organs cannot work as they should. This causes serious problems throughout the body and in the brain. What are the causes? This condition may be caused by:  Exposure to high altitude.  A collapsed lung (pneumothorax).  Lung infection (pneumonia).  Lung injury.  Long-term (chronic) lung  disease, such as COPD (chronic obstructive pulmonary disease).  Blood collecting in the chest cavity (hemothorax).  Food, saliva, or vomit getting into the airway (aspiration).  Reduced blood flow (ischemia).  Severe blood loss.  Slow or shallow breathing (hypoventilation).  Blood disorders, such as anemia.  Carbon monoxide poisoning.  The heart suddenly stopping (cardiac arrest).  Anesthetic medicines.  Drowning.  Choking. What are the signs or symptoms? Symptoms of this condition include:  Headache.  Fatigue.  Drowsiness.  Forgetfulness.  Nausea.  Confusion.  Shortness of breath.  Dizziness.  Bluish color of the skin, lips, or nail beds (cyanosis).  Change in consciousness or awareness. If hypoxia is not treated, it can lead to convulsions, loss of consciousness (coma), or brain damage. How is this diagnosed? This condition may be diagnosed based on:  A physical exam.  Blood tests.  A test that measures how much oxygen is in your blood (pulse oximetry). This is done with a sensor that is placed on your finger, toe, or earlobe.  Chest X-ray.  Tests to check your lung function (pulmonary function tests).  A test to check the electrical activity of your heart (electrocardiogram, ECG). You may have other tests to determine the cause of your hypoxia. How is this treated?  Treatment for this condition depends on what is causing the hypoxia. You will likely be treated with oxygen therapy. This may be done by giving you oxygen through a face mask or through tubes in your nose. Your health care provider may also recommend other therapies to treat the underlying cause of your hypoxia. Follow these instructions at home:  Take over-the-counter and prescription medicines only as told by your health care provider.  Do not use any products that contain nicotine or tobacco, such as cigarettes and e-cigarettes. If you need help quitting, ask your health care  provider.  Avoid secondhand smoke.  Work with your health care provider to manage any chronic conditions you have that may be causing hypoxia, such as COPD.  Keep all follow-up visits as told by your health care provider. This is important. Contact a health care provider if:  You have a fever.  You have trouble breathing, even after treatment.  You become extremely short of breath when you exercise. Get help right away if:  Your shortness of breath gets worse, especially with normal or very little activity.  Your skin, lips, or nail beds have a bluish color.  You become confused or you cannot think properly.  You have chest pain. Summary  Hypoxia is a condition that happens when there is a lack of oxygen in the body's tissues and organs.  If hypoxia is not treated, it can lead to convulsions, loss of consciousness (coma), or brain damage.  Symptoms of hypoxia can include a headache, shortness of breath, confusion, nausea, and a bluish skin color.  Hypoxia has  many possible causes, including exposure to high altitude, carbon monoxide poisoning, or other health issues, such as blood disorders or cardiac arrest.  Hypoxia is usually treated with oxygen therapy. This information is not intended to replace advice given to you by your health care provider. Make sure you discuss any questions you have with your health care provider. Document Released: 01/22/2017 Document Revised: 11/16/2017 Document Reviewed: 01/22/2017 Elsevier Patient Education  2020 [employer].

## 2019-12-14 NOTE — Progress Notes (Signed)
PROGRESS NOTE    Morgan Dennis  [id number] DOB: 1933-12-25 DOA: 12/09/2019  PCP: Jerrol Banana., MD    LOS - 4   Brief Narrative:  85 y.o.femalewith medical history significant ofrheumatoid arthritis, hypertension, Parkinson's disease, dementia, restless leg syndrome and GERD whose husband was diagnosed with COVID-19 and is currentlyadmittedin the hospital.Home health nursevisitfoundpatienthypoxic with oxygen sats in the 7s. Had been havnigfever and chills. In the ED,requiring 2 L/minoxygen.Testedpositive for COVID-19. Chest x-ray showed bilateral infiltrates.Admitted to the hospital with acute respiratory failure with hypoxemia secondary to COVID-19 pneumonia.  Started on Remdesivir, Decadron, vitamin C and zinc.  Was requiring 2L/min supplemental oxygen, now weaned to room air.  Subjective: Patient was needing 2 L of oxygen since couple of days ago on and off.  Currently on 2 L supplemental oxygen via nasal cannula on ambulation and at rest to maintain the saturation above 92 to 94%.  Per nurses patient feels too weak and is unable to ambulate as much as she used to.  Feeling very tired and fatigued.  Denies fevers or chills, chest pain, shortness of breath, nausea vomiting or diarrhea.  Spoke with son and son is concerned that given that he has his father being Covid positive and who needs more attention and care, and home health care nursing may not be able to provide care today, may not be able to provide the care to the patient after discharge today.  Assessment & Plan:   Principal Problem:   Acute respiratory failure due to COVID-19 [facility]) Active Problems:   Rheumatoid arthritis ([place])   Parkinson's disease ([place])   Acute respiratory failure with hypoxia (HCC)   Thrombocytopenia (HCC)   Dementia without behavioral disturbance ([place])   Acute Respiratory Failure secondary to COVID-19 Pneumonia --Status postremdesivir course completion per pharmacy  protocol -on day 12/27 --contDecadron IV for total of 10 days --maintain airborne and contact precautions --supplemental oxygen, maintain o2 sat > 90% --cont Ventolininhaler --contvitamin C and zinc --contTussionex and Robitussin  Rheumatoid arthritis Outpatient regimen appears to be methotrexate every Wednesday and abatacept  Parkinson's disease  Dementia without behavioral disturbance -Continue home meds: Sinemet, Aricept, Namenda, ropinirole  Peripheral neuropathy -Continue gabapentin, Cymbalta  Thrombocytopenia Suspect this is secondary to acute infection. -Monitor CBC  Mobility: Patient requires more assistance.  Patient is to be discharged home with home health care nursing and PT.  Patient will also be needing home O2. -Home health care nursing or PT will not be kicked in today being weekend and patient's son may not be able to provide as much care as patient may need and patient safety may be compromised.  Patient is to stay tonight and if everything remains well patient will be discharged tomorrow home.  Son also requested.  DVT prophylaxis:Lovenox Code Status: DNR Family Communication:Son updated by phone Disposition Plan: Discharge home with home health, and care of son(s).  Expect ready tomorrow 12/28   Consultants:  None  Procedures:  None  Antimicrobials:  None   Objective: Vitals:   12/14/19 0350 12/14/19 0426 12/14/19 0828 12/14/19 1106  BP:  (!) 150/68 (!) 154/77   Pulse:  70 75   Resp:  16 19   Temp:  98.4 F (36.9 C) 98.5 F (36.9 C)   TempSrc:  Oral Oral   SpO2:  96% 95% 93%  Weight: 70.2 kg     Height:        Intake/Output Summary (Last 24 hours) at 12/14/2019 1316 Last data filed at 12/13/2019  2356 Gross per 24 hour  Intake 549.43 ml  Output 650 ml  Net -100.57 ml   Filed Weights   12/12/19 0458 12/13/19 0354 12/14/19 0350  Weight: 73.8 kg 73 kg 70.2 kg    Examination:  General exam: awake, alert, no  acute distress HEENT: moist mucus membranes, hearing impairment Respiratory system: clear but diminished bilaterally, no wheezes, rales or rhonchi, normal respiratory effort.   Cardiovascular system: normal 99991111, RRR, 2/6 systolic murmur, no pedal edema.   Gastrointestinal system: soft, non-tender, non-distended abdomen Central nervous system: alert and oriented x2. no gross focal neurologic deficits, normal speech Extremities: moves all, no cyanosis, normal tone Psychiatry: normal mood, congruent affect, abnormal judgement and insight (baseline dementia)   Data Reviewed: I have personally reviewed following labs and imaging studies  CBC: Recent Labs  Lab 12/10/19 0527 12/11/19 0649 12/12/19 0054 12/13/19 0509 12/14/19 0640  WBC 6.0 5.8 4.5 6.1 7.0  NEUTROABS  --  3.0 2.3 3.3 4.1  HGB 12.1 11.9* 12.8 12.8 12.7  HCT 35.1* 35.0* 36.8 37.1 38.6  MCV 90.5 91.6 89.5 89.6 93.2  PLT 107* 119* 149* 163 0000000   Basic Metabolic Panel: Recent Labs  Lab 12/11/19 0649 12/11/19 2331 12/12/19 0054 12/13/19 0509 12/14/19 0640  NA 140 137 136 137 137  K 3.8 4.0 3.6 3.8 4.6  CL 104 101 101 101 100  CO2 27 27 28 28 27   GLUCOSE 92 110* 104* 97 98  BUN 18 19 19 18 21   CREATININE 0.61 0.51 0.50 0.59 0.65  CALCIUM 8.4* 8.8* 9.0 8.6* 8.7*  MG  --  2.0  --   --   --    GFR: Estimated Creatinine Clearance: 51.2 mL/min (by C-G formula based on SCr of 0.65 mg/dL). Liver Function Tests: Recent Labs  Lab 12/10/19 0527 12/11/19 0649 12/12/19 0054 12/13/19 0509 12/14/19 0640  AST 25 25 24 25 21   ALT 21 6 5 6 5   ALKPHOS 74 66 70 69 62  BILITOT 0.7 0.6 0.5 0.5 0.8  PROT 6.4* 6.1* 6.7 6.5 6.4*  ALBUMIN 3.3* 3.2* 3.5 3.5 3.3*   No results for input(s): LIPASE, AMYLASE in the last 168 hours. No results for input(s): AMMONIA in the last 168 hours. Coagulation Profile: No results for input(s): INR, PROTIME in the last 168 hours. Cardiac Enzymes: No results for input(s): CKTOTAL, CKMB,  CKMBINDEX, TROPONINI in the last 168 hours. BNP (last 3 results) No results for input(s): PROBNP in the last 8760 hours. HbA1C: No results for input(s): HGBA1C in the last 72 hours. CBG: Recent Labs  Lab 12/12/19 1941  GLUCAP 159*   Lipid Profile: No results for input(s): CHOL, HDL, LDLCALC, TRIG, CHOLHDL, LDLDIRECT in the last 72 hours. Thyroid Function Tests: No results for input(s): TSH, T4TOTAL, FREET4, T3FREE, THYROIDAB in the last 72 hours. Anemia Panel: Recent Labs    12/13/19 0509 12/14/19 0640  FERRITIN 214 191   Sepsis Labs: Recent Labs  Lab 12/09/19 1800  PROCALCITON <0.10    No results found for this or any previous visit (from the past 240 hour(s)).       Radiology Studies: No results found.      Scheduled Meds: . albuterol  2 puff Inhalation Q6H  . amLODipine  5 mg Oral Daily  . vitamin C  500 mg Oral Daily  . carbidopa-levodopa  1 tablet Oral QHS  . carbidopa-levodopa  1.5 tablet Oral TID  . cholecalciferol  2,000 Units Oral Daily  . darifenacin  7.5 mg Oral Daily  . dexamethasone (DECADRON) injection  6 mg Intravenous Q24H  . donepezil  10 mg Oral Daily  . DULoxetine  60 mg Oral Daily  . enoxaparin (LOVENOX) injection  40 mg Subcutaneous Q24H  . folic acid  1 mg Oral Daily  . gabapentin  600 mg Oral BID  . Melatonin  5 mg Oral QHS  . memantine  10 mg Oral BID  . multivitamin with minerals  1 tablet Oral Daily  . rOPINIRole  0.25 mg Oral QID  . vitamin E  400 Units Oral Daily  . zinc sulfate  220 mg Oral Daily   Continuous Infusions: . sodium chloride Stopped (12/13/19 0948)     LOS: 4 days    Time spent: 30-35 minutes    Thornell Mule, DO [facility]   If 7PM-7AM, please contact night-coverage [email or web address] Password [email or web address] 12/14/2019, 1:16 PM

## 2019-12-14 NOTE — TOC Progression Note (Addendum)
Transition of Care [facility]) - Progression Note    Patient Details  Name: Morgan Dennis MRN: [id number] Date of Birth: [birth year]  Transition of Care [facility]) CM/SW Contact  Ross Ludwig, [place] Phone Number: 12/14/2019, 1:15 PM  Clinical Narrative:     CSW was informed that patient will need oxygen for discharge.  CSW contacted [employer] and spoke to [place] and she told CSW to fax referral to [place] to [place], [id number].  CSW faxed orders, facesheet, H and P, and progress note with qualifying stats.  They will deliver the oxygen to hospital and contact son in regards to delivery of oxygen.  1:14pm  CSW was just updated by bedside nurse, patient is not discharging today now per MD.  CSW to update DME equipment rep.  CSW called [employer] to let them know patient is not discharging today, left message awaiting for call back.  Expected Discharge Plan: [place] Barriers to Discharge: Barriers Resolved  Expected Discharge Plan and Services Expected Discharge Plan: [place] In-house Referral: Clinical Social Work   Post Acute Care Choice: [place] arrangements for the past 2 months: [place] Expected Discharge Date: 12/15/19               DME Arranged: Oxygen DME Agency: [employer] Date DME Agency Contacted: 12/14/19 Time DME Agency Contacted: [phone] Representative spoke with at DME Agency: [place] Arranged: PT, OT, Nurse's Aide [place] Agency: [place] Date [place]: 12/14/19 Time [place]: 1247 Representative spoke with at [place]: [place] ([place]) Interventions    Readmission Risk Interventions No flowsheet data found.

## 2019-12-14 NOTE — Plan of Care (Signed)
  Problem: Education: Goal: Knowledge of General Education information will improve Description: Including pain rating scale, medication(s)/side effects and non-pharmacologic comfort measures Outcome: Progressing   Problem: Pain Managment: Goal: General experience of comfort will improve Outcome: Progressing   Problem: Safety: Goal: Ability to remain free from injury will improve Outcome: Progressing   

## 2019-12-15 LAB — CBC WITH DIFFERENTIAL/PLATELET
Abs Immature Granulocytes: 0.05 10*3/uL (ref 0.00–0.07)
Basophils Absolute: 0 10*3/uL (ref 0.0–0.1)
Basophils Relative: 0 %
Eosinophils Absolute: 0 10*3/uL (ref 0.0–0.5)
Eosinophils Relative: 0 %
HCT: 39.5 % (ref 36.0–46.0)
Hemoglobin: 13.3 g/dL (ref 12.0–15.0)
Immature Granulocytes: 1 %
Lymphocytes Relative: 23 %
Lymphs Abs: 2.1 10*3/uL (ref 0.7–4.0)
MCH: 31.1 pg (ref 26.0–34.0)
MCHC: 33.7 g/dL (ref 30.0–36.0)
MCV: 92.5 fL (ref 80.0–100.0)
Monocytes Absolute: 1 10*3/uL (ref 0.1–1.0)
Monocytes Relative: 11 %
Neutro Abs: 5.8 10*3/uL (ref 1.7–7.7)
Neutrophils Relative %: 65 %
Platelets: 236 10*3/uL (ref 150–400)
RBC: 4.27 MIL/uL (ref 3.87–5.11)
RDW: 14.1 % (ref 11.5–15.5)
WBC: 9 10*3/uL (ref 4.0–10.5)
nRBC: 0 % (ref 0.0–0.2)

## 2019-12-15 LAB — COMPREHENSIVE METABOLIC PANEL
ALT: 9 U/L (ref 0–44)
AST: 21 U/L (ref 15–41)
Albumin: 3.5 g/dL (ref 3.5–5.0)
Alkaline Phosphatase: 65 U/L (ref 38–126)
Anion gap: 9 (ref 5–15)
BUN: 23 mg/dL (ref 8–23)
CO2: 26 mmol/L (ref 22–32)
Calcium: 8.6 mg/dL — ABNORMAL LOW (ref 8.9–10.3)
Chloride: 100 mmol/L (ref 98–111)
Creatinine, Ser: 0.55 mg/dL (ref 0.44–1.00)
GFR calc Af Amer: >60 mL/min (ref >60)
GFR calc non Af Amer: >60 mL/min (ref >60)
Glucose, Bld: 92 mg/dL (ref 70–99)
Potassium: 3.8 mmol/L (ref 3.5–5.1)
Sodium: 135 mmol/L (ref 135–145)
Total Bilirubin: 0.9 mg/dL (ref 0.3–1.2)
Total Protein: 6.6 g/dL (ref 6.5–8.1)

## 2019-12-15 LAB — C-REACTIVE PROTEIN: CRP: 0.6 mg/dL (ref <1.0)

## 2019-12-15 LAB — FIBRIN DERIVATIVES D-DIMER (ARMC ONLY): Fibrin derivatives D-dimer (ARMC): 254.29 ng/mL (FEU) (ref 0.00–499.00)

## 2019-12-15 LAB — FERRITIN: Ferritin: 215 ng/mL (ref 11–307)

## 2019-12-15 NOTE — TOC Progression Note (Signed)
Transition of Care [facility]) - Progression Note    Patient Details  Name: Morgan Dennis MRN: [id number] Date of Birth: [birth year]  Transition of Care [facility]) CM/SW [place], LCSW Phone Number: 12/15/2019, 11:17 AM  Clinical Narrative: Per RN, oxygen has been delivered. Brought walker up to unit for RN to give to her next time she goes in the room.    Expected Discharge Plan: [place] Barriers to Discharge: Barriers Resolved  Expected Discharge Plan and Services Expected Discharge Plan: [place] In-house Referral: Clinical Social Work   Post Acute Care Choice: [place] arrangements for the past 2 months: [place] Expected Discharge Date: 12/15/19               DME Arranged: Oxygen DME Agency: [employer] Date DME Agency Contacted: 12/14/19 Time DME Agency Contacted: [phone] Representative spoke with at DME Agency: [place] Arranged: PT, OT, Nurse's Aide [place] Agency: [place] Date [place]: 12/14/19 Time [place]: 1247 Representative spoke with at [place]: [place] ([place]) Interventions    Readmission Risk Interventions No flowsheet data found.

## 2019-12-15 NOTE — Discharge Summary (Signed)
Physician Discharge Summary  Patient ID: Morgan Dennis MRN: [id number] DOB/AGE: [birth year] 85 y.o.  Admit date: 12/09/2019 Discharge date: 12/15/2019  Admission Diagnoses:  Discharge Diagnoses:  Principal Problem:   Acute respiratory failure due to COVID-19 [facility]) Active Problems:   Rheumatoid arthritis ([place])   Parkinson's disease ([place])   Acute respiratory failure with hypoxia ([place])   Thrombocytopenia ([place])   Dementia without behavioral disturbance ([place])   Discharged Condition: fair  Hospital Course:  Brief Narrative:  85 y.o.femalewith medical history significant ofrheumatoid arthritis, hypertension, Parkinson's disease, dementia, restless leg syndrome and GERD whose husband was diagnosed with COVID-19 and is currentlyadmittedin the hospital.Home health nursevisitfoundpatienthypoxic with oxygen sats in the 78s. Had been havnigfever and chills. In the ED,requiring 2 L/minoxygen.Testedpositive for COVID-19. Chest x-ray showed bilateral infiltrates.Admitted to the hospital with acute respiratory failure with hypoxemia secondary to COVID-19 pneumonia.Started on Remdesivir, Decadron, vitamin C and zinc. Was requiring 2L/min supplemental oxygen, now weaned to room air.  Acute Respiratory Failure secondary to COVID-19 Pneumonia - Much improved; maintaining O2-sat with minimal supplemental O2  --Status postremdesivir course completion per pharmacy protocol-on  12/27 --contDecadron IV for total of 10 days; Rx for PO given  --maintain airborne and contact precautions --supplemental oxygen, maintain o2 sat > 90% --cont Ventolininhaler --contvitamin C and zinc --contTussionex and Robitussin - F/U PCP/ telemedicine in 1-2 weeks   Rheumatoid arthritis:   Outpatient regimen appears to be methotrexate every Wednesday and abatacept - F/U OP   Parkinson's disease  Dementia without behavioral disturbance -Continue home meds: Sinemet, Aricept, Namenda,  ropinirole  Peripheral neuropathy -Continue gabapentin, Cymbalta  Thrombocytopenia: Resolved  Suspect this is secondary to acute infection. -Monitor CBC  Mobility: Patient requires more assistance as expected.  Patient is to be discharged home with home health care nursing and PT.  Patient will also be needing home O2. -Home health care nursing or PT arranged and set up by CM/SW - Patient is will be discharged today home   Disposition Plan: Discharge home with home health, and care of son(s)   Consults: None  Significant Diagnostic Studies: Radiology and Blood Work   Treatments: Hospital course and d/c medlist   Discharge Exam: Blood pressure 131/79, pulse 68, temperature 98.5 F (36.9 C), temperature source Oral, resp. rate 20, height 5\' 4"  (1.626 m), weight 72.1 kg, SpO2 95 %.  General exam: awake, alert, no acute distress HEENT: moist mucus membranes, hearing impairment Respiratory system: clear but diminished bilaterally, no wheezes, rales or rhonchi, normal respiratory effort.   Cardiovascular system: normal 99991111, RRR, 2/6 systolic murmur, no pedal edema.   Gastrointestinal system: soft, non-tender, non-distended abdomen Central nervous system: alert and oriented x2. no gross focal neurologic deficits, normal speech Extremities: moves all, no cyanosis, normal tone Psychiatry: normal mood, congruent affect, abnormal judgement and insight (baseline dementia)  Disposition: Discharge disposition: 06-Home-Health Care Svc     Stable to d/c home with [place] with PT/OT/Aide. Explained to son. Warning S/S explained when they must seek immediate medical attention. Son expressed understanding.   Discharge Instructions    Call MD for:   Complete by: As directed    Worsening shortness of breath   Call MD for:  difficulty breathing, headache or visual disturbances   Complete by: As directed    Call MD for:  extreme fatigue   Complete by: As directed    Call MD for:   persistant nausea and vomiting   Complete by: As directed    Call MD for:  temperature >100.4  Complete by: As directed    Call MD for:  temperature >100.4   Complete by: As directed    Diet - low sodium heart healthy   Complete by: As directed    Diet - low sodium heart healthy   Complete by: As directed    Discharge instructions   Complete by: As directed    We started a new medication for high blood pressure. Please follow up with primary care doctor regarding blood pressure. If you have blood pressure monitor at home, please check it once daily and write them down to bring to follow up with your primary care doctor. Bring   Increase activity slowly   Complete by: As directed    Walk with assistance   Complete by: As directed      Allergies as of 12/15/2019      Reactions   Penicillins Swelling, Rash   Other reaction(s): Rash Has patient had a PCN reaction causing immediate rash, facial/tongue/throat swelling, SOB or lightheadedness with hypotension: Yes Has patient had a PCN reaction causing severe rash involving mucus membranes or skin necrosis: Unknown Has patient had a PCN reaction that required hospitalization:Yes Has patient had a PCN reaction occurring within the last 10 years: No If all of the above answers are "NO", then may proceed with Cephalosporin use.   Evista  [raloxifene]    Other reaction(s): Joint Pains   Ibandronic Acid    Other reaction(s): Muscle Pain   Remicade [infliximab] Other (See Comments)   Due to bleeding ulcer issue--MD advised her not to take   Risedronate Sodium    Other reaction(s): Joint Pains      Medication List    TAKE these medications   abatacept 250 MG injection Commonly known as: ORENCIA Inject 125 mg into the vein every 30 (thirty) days.   acetaminophen 325 MG tablet Commonly known as: TYLENOL Take 2 tablets (650 mg total) by mouth every 6 (six) hours as needed for mild pain (or Fever >/= 101).   amLODipine 5 MG  tablet Commonly known as: NORVASC Take 1 tablet (5 mg total) by mouth daily.   ascorbic acid 1000 MG tablet Commonly known as: VITAMIN C Take 1,000 mg by mouth daily. What changed: Another medication with the same name was added. Make sure you understand how and when to take each.   ascorbic acid 500 MG tablet Commonly known as: VITAMIN C Take 1 tablet (500 mg total) by mouth daily. What changed: You were already taking a medication with the same name, and this prescription was added. Make sure you understand how and when to take each.   CALCIUM PO Take 2,000 mg by mouth daily.   carbidopa-levodopa 50-200 MG tablet Commonly known as: SINEMET CR Take 1 tablet by mouth at bedtime.   carbidopa-levodopa 25-100 MG tablet Commonly known as: SINEMET IR Take 1.5 tablets by mouth 3 (three) times daily.   denosumab 60 MG/ML Sosy injection Commonly known as: PROLIA Inject 60 mg into the skin every 6 (six) months.   docusate sodium 100 MG capsule Commonly known as: COLACE Take 100 mg by mouth daily as needed.   donepezil 10 MG tablet Commonly known as: ARICEPT Take 10 mg by mouth daily.   DULoxetine 60 MG capsule Commonly known as: CYMBALTA Take 60 mg by mouth daily.   Fish Oil 1000 MG Caps Take 1 capsule (1,000 mg total) by mouth daily.   folic acid 1 MG tablet Commonly known as: FOLVITE Take 1 mg  by mouth daily.   gabapentin 600 MG tablet Commonly known as: NEURONTIN Take 1 tablet (600 mg total) by mouth 2 (two) times daily.   guaiFENesin-dextromethorphan 100-10 MG/5ML syrup Commonly known as: ROBITUSSIN DM Take 10 mLs by mouth every 4 (four) hours as needed for cough.   HYDROcodone-acetaminophen 7.5-325 MG tablet Commonly known as: NORCO Take 0.5-1 tablets by mouth 2 (two) times daily as needed for moderate pain.   ketorolac 10 MG tablet Commonly known as: TORADOL Take 10 mg by mouth every 8 (eight) hours as needed (pain).   Melatonin 5 MG Tabs Take 5 mg by  mouth at bedtime.   memantine 10 MG tablet Commonly known as: NAMENDA Take 10 mg by mouth 2 (two) times daily.   methotrexate 2.5 MG tablet Take 15 mg by mouth every Wednesday.   multivitamin with minerals Tabs tablet Take 1 tablet by mouth daily.   pregabalin 150 MG capsule Commonly known as: LYRICA Take 1 capsule by mouth 2 (two) times daily.   rOPINIRole 0.25 MG tablet Commonly known as: REQUIP Take 1 tablet by mouth 4 (four) times daily.   solifenacin 5 MG tablet Commonly known as: VESIcare Take 1 tablet (5 mg total) by mouth daily.   traMADol 50 MG tablet Commonly known as: ULTRAM Take 50 mg by mouth every 12 (twelve) hours as needed.   Vitamin D3 50 MCG (2000 UT) Tabs Take 2,000 Units by mouth daily.   vitamin E 400 UNIT capsule Take 400 Units by mouth daily.   zinc sulfate 220 (50 Zn) MG capsule Take 1 capsule (220 mg total) by mouth daily.            Durable Medical Equipment  (From admission, onward)         Start     Ordered   12/14/19 1234  For home use only DME oxygen  Once    Question Answer Comment  Length of Need 12 Months   Mode or (Route) Nasal cannula   Liters per Minute 2   Frequency Continuous (stationary and portable oxygen unit needed)   Oxygen delivery system Gas      12/14/19 1233   12/13/19 1330  For home use only DME Walker rolling  Once    Question:  Patient needs a walker to treat with the following condition  Answer:  Generalized weakness   12/13/19 1330         Follow-up Information    Jerrol Banana., MD Follow up in 1 week(s).   Specialty: Family Medicine Contact information: [address] [place] [address] [phone]           Signed: Thornell Mule 12/15/2019, 2:01 PM

## 2019-12-15 NOTE — Plan of Care (Signed)

## 2019-12-15 NOTE — TOC Transition Note (Signed)
Transition of Care [facility]) - CM/SW Discharge Note   Patient Details  Name: Morgan Dennis MRN: [id number] Date of Birth: 1934-05-01  Transition of Care [facility]) CM/SW Contact:  Candie Chroman, LCSW Phone Number: 12/15/2019, 12:15 PM   Clinical Narrative:  Patient has orders to discharge home today. [facility] is aware and will follow up tomorrow. No further concerns. CSW signing off.   Final next level of care: [place] Barriers to Discharge: Barriers Resolved   Patient Goals and CMS Choice Patient states their goals for this hospitalization and ongoing recovery are:: To return back home with home health. CMS Medicare.gov Compare Post Acute Care list provided to:: Patient Choice offered to / list presented to : NA  Discharge Placement                    Patient and family notified of of transfer: 12/15/19  Discharge Plan and Services In-house Referral: Clinical Social Work   Post Acute Care Choice: Home Health          DME Arranged: Oxygen, Walker rolling DME Agency: [employer] Date DME Agency Contacted: 12/15/19 Time DME Agency Contacted: [phone] Representative spoke with at DME Agency: [place] Arranged: PT, OT, Nurse's Aide [place] Agency: [place] Date [place]: 12/15/19 Time [place]: 1247 Representative spoke with at [place]: [place] ([place]) Interventions     Readmission Risk Interventions No flowsheet data found.

## 2019-12-15 NOTE — Care Management Important Message (Signed)
Important Message  Patient Details  Name: MERSADIES LEAK MRN: [id number] Date of Birth: Feb 09, 1934   Medicare Important Message Given:  Yes  Verbally reviewed with son, KRIZIA JAIMES, at [phone].  Requested copy be emailed to [email or web address].  Confirmed address. Copy of Medicare IM sent securely to email address provided.     Dannette Barbara 12/15/2019, 2:11 PM

## 2019-12-16 ENCOUNTER — Telehealth: Payer: Self-pay

## 2019-12-16 NOTE — Telephone Encounter (Signed)
I have made the 1st attempt to contact the patient or family member in charge, in order to follow up from recently being discharged from the hospital. I left a message on voicemail requesting a CB. -MM 

## 2019-12-16 NOTE — Telephone Encounter (Signed)
Please advise 

## 2019-12-16 NOTE — Telephone Encounter (Signed)
No HFU scheduled at this point. There is a 2 month f/u scheduled 12/29/19 with PCP.

## 2019-12-16 NOTE — Telephone Encounter (Signed)
Yes for appt and long term care can be discussed then

## 2019-12-16 NOTE — Telephone Encounter (Signed)
Transition Care Management Follow-up Telephone Call  Date of discharge and from where: [facility] on 12/15/19  How have you been since you were released from the hospital? Per son [name]) pt has been doing ok. Her oxygen levels have increased to the mid 90s. Oxygen dropped to 90 last night but is back to the mid 90s today. Declined fever, pain, cough, congestion, SOB or n/v/d.   Any questions or concerns? Yes, is pt ok to come in for the follow up apt on 12/29/19 or will it need to be virtual? What are the steps for moving pt and husband to a long term care facility? Can this be discussed at 12/29/19 apt?  Items Reviewed:  Did the pt receive and understand the discharge instructions provided? Yes   Medications obtained and verified? Declined reviewing medications until f/u apt.   Any new allergies since your discharge? No   Dietary orders reviewed? Yes  Do you have support at home? Yes   Other (ie: DME, Home Health, etc) Pt already has a home health nurse that comes for PT a couple times weekly. Orders have been extended. A walker and oxygen was ordered for as needed basis.   Functional Questionnaire: (I = Independent and D = Dependent)  Bathing/Dressing- I   Meal Prep- I  Eating- I  Maintaining continence- I  Transferring/Ambulation- D, walker ordered for assistance. Has a cane if needed.   Managing Meds- D, husband manages.    Follow up appointments reviewed:    PCP Hospital f/u appt confirmed? Yes  Scheduled to see Dr Rosanna Randy on 12/29/19 @ 2:40 PM.  [facility] f/u appt confirmed? N/A   Are transportation arrangements needed? No   If their condition worsens, is the pt aware to call  their PCP or go to the ED? Yes  Was the patient provided with contact information for the PCP's office or ED? Yes  Was the pt encouraged to call back with questions or concerns? Yes

## 2019-12-18 DIAGNOSIS — G20 Parkinson's disease: Secondary | ICD-10-CM | POA: Diagnosis not present

## 2019-12-18 DIAGNOSIS — M069 Rheumatoid arthritis, unspecified: Secondary | ICD-10-CM | POA: Diagnosis not present

## 2019-12-18 DIAGNOSIS — J189 Pneumonia, unspecified organism: Secondary | ICD-10-CM | POA: Diagnosis not present

## 2019-12-18 DIAGNOSIS — G2581 Restless legs syndrome: Secondary | ICD-10-CM | POA: Diagnosis not present

## 2019-12-18 DIAGNOSIS — J42 Unspecified chronic bronchitis: Secondary | ICD-10-CM | POA: Diagnosis not present

## 2019-12-18 DIAGNOSIS — M5441 Lumbago with sciatica, right side: Secondary | ICD-10-CM | POA: Diagnosis not present

## 2019-12-18 DIAGNOSIS — H905 Unspecified sensorineural hearing loss: Secondary | ICD-10-CM | POA: Diagnosis not present

## 2019-12-18 DIAGNOSIS — R69 Illness, unspecified: Secondary | ICD-10-CM | POA: Diagnosis not present

## 2019-12-18 DIAGNOSIS — M1990 Unspecified osteoarthritis, unspecified site: Secondary | ICD-10-CM | POA: Diagnosis not present

## 2019-12-18 DIAGNOSIS — M810 Age-related osteoporosis without current pathological fracture: Secondary | ICD-10-CM | POA: Diagnosis not present

## 2019-12-23 DIAGNOSIS — R69 Illness, unspecified: Secondary | ICD-10-CM | POA: Diagnosis not present

## 2019-12-23 DIAGNOSIS — H905 Unspecified sensorineural hearing loss: Secondary | ICD-10-CM | POA: Diagnosis not present

## 2019-12-23 DIAGNOSIS — M810 Age-related osteoporosis without current pathological fracture: Secondary | ICD-10-CM | POA: Diagnosis not present

## 2019-12-23 DIAGNOSIS — G2581 Restless legs syndrome: Secondary | ICD-10-CM | POA: Diagnosis not present

## 2019-12-23 DIAGNOSIS — J42 Unspecified chronic bronchitis: Secondary | ICD-10-CM | POA: Diagnosis not present

## 2019-12-23 DIAGNOSIS — M5441 Lumbago with sciatica, right side: Secondary | ICD-10-CM | POA: Diagnosis not present

## 2019-12-23 DIAGNOSIS — G20 Parkinson's disease: Secondary | ICD-10-CM | POA: Diagnosis not present

## 2019-12-23 DIAGNOSIS — M069 Rheumatoid arthritis, unspecified: Secondary | ICD-10-CM | POA: Diagnosis not present

## 2019-12-23 DIAGNOSIS — M1990 Unspecified osteoarthritis, unspecified site: Secondary | ICD-10-CM | POA: Diagnosis not present

## 2019-12-23 DIAGNOSIS — J189 Pneumonia, unspecified organism: Secondary | ICD-10-CM | POA: Diagnosis not present

## 2019-12-23 NOTE — Telephone Encounter (Signed)
VV scheduled for 12/29/19 @ 2:40 PM with Dr Rosanna Randy.

## 2019-12-25 DIAGNOSIS — M810 Age-related osteoporosis without current pathological fracture: Secondary | ICD-10-CM | POA: Diagnosis not present

## 2019-12-25 DIAGNOSIS — G2581 Restless legs syndrome: Secondary | ICD-10-CM | POA: Diagnosis not present

## 2019-12-25 DIAGNOSIS — J42 Unspecified chronic bronchitis: Secondary | ICD-10-CM | POA: Diagnosis not present

## 2019-12-25 DIAGNOSIS — F0280 Dementia in other diseases classified elsewhere without behavioral disturbance: Secondary | ICD-10-CM | POA: Diagnosis not present

## 2019-12-25 DIAGNOSIS — R69 Illness, unspecified: Secondary | ICD-10-CM | POA: Diagnosis not present

## 2019-12-25 DIAGNOSIS — J189 Pneumonia, unspecified organism: Secondary | ICD-10-CM | POA: Diagnosis not present

## 2019-12-25 DIAGNOSIS — M1990 Unspecified osteoarthritis, unspecified site: Secondary | ICD-10-CM | POA: Diagnosis not present

## 2019-12-25 DIAGNOSIS — G20 Parkinson's disease: Secondary | ICD-10-CM | POA: Diagnosis not present

## 2019-12-25 DIAGNOSIS — M5441 Lumbago with sciatica, right side: Secondary | ICD-10-CM | POA: Diagnosis not present

## 2019-12-25 DIAGNOSIS — M069 Rheumatoid arthritis, unspecified: Secondary | ICD-10-CM | POA: Diagnosis not present

## 2019-12-26 DIAGNOSIS — M1990 Unspecified osteoarthritis, unspecified site: Secondary | ICD-10-CM | POA: Diagnosis not present

## 2019-12-26 DIAGNOSIS — M5441 Lumbago with sciatica, right side: Secondary | ICD-10-CM | POA: Diagnosis not present

## 2019-12-26 DIAGNOSIS — G20 Parkinson's disease: Secondary | ICD-10-CM | POA: Diagnosis not present

## 2019-12-26 DIAGNOSIS — J189 Pneumonia, unspecified organism: Secondary | ICD-10-CM | POA: Diagnosis not present

## 2019-12-26 DIAGNOSIS — M069 Rheumatoid arthritis, unspecified: Secondary | ICD-10-CM | POA: Diagnosis not present

## 2019-12-26 DIAGNOSIS — R69 Illness, unspecified: Secondary | ICD-10-CM | POA: Diagnosis not present

## 2019-12-26 DIAGNOSIS — M810 Age-related osteoporosis without current pathological fracture: Secondary | ICD-10-CM | POA: Diagnosis not present

## 2019-12-26 DIAGNOSIS — M0579 Rheumatoid arthritis with rheumatoid factor of multiple sites without organ or systems involvement: Secondary | ICD-10-CM | POA: Diagnosis not present

## 2019-12-26 DIAGNOSIS — H905 Unspecified sensorineural hearing loss: Secondary | ICD-10-CM | POA: Diagnosis not present

## 2019-12-26 DIAGNOSIS — G2581 Restless legs syndrome: Secondary | ICD-10-CM | POA: Diagnosis not present

## 2019-12-26 DIAGNOSIS — J42 Unspecified chronic bronchitis: Secondary | ICD-10-CM | POA: Diagnosis not present

## 2019-12-29 ENCOUNTER — Ambulatory Visit (INDEPENDENT_AMBULATORY_CARE_PROVIDER_SITE_OTHER): Payer: Medicare HMO | Admitting: Family Medicine

## 2019-12-29 ENCOUNTER — Ambulatory Visit: Payer: Self-pay | Admitting: Family Medicine

## 2019-12-29 DIAGNOSIS — R5381 Other malaise: Secondary | ICD-10-CM

## 2019-12-29 DIAGNOSIS — J9600 Acute respiratory failure, unspecified whether with hypoxia or hypercapnia: Secondary | ICD-10-CM

## 2019-12-29 DIAGNOSIS — M069 Rheumatoid arthritis, unspecified: Secondary | ICD-10-CM

## 2019-12-29 DIAGNOSIS — F0390 Unspecified dementia without behavioral disturbance: Secondary | ICD-10-CM

## 2019-12-29 DIAGNOSIS — R69 Illness, unspecified: Secondary | ICD-10-CM | POA: Diagnosis not present

## 2019-12-29 DIAGNOSIS — M0579 Rheumatoid arthritis with rheumatoid factor of multiple sites without organ or systems involvement: Secondary | ICD-10-CM | POA: Diagnosis not present

## 2019-12-29 DIAGNOSIS — U071 COVID-19: Secondary | ICD-10-CM

## 2019-12-29 DIAGNOSIS — G20 Parkinson's disease: Secondary | ICD-10-CM | POA: Diagnosis not present

## 2019-12-29 DIAGNOSIS — M8000XD Age-related osteoporosis with current pathological fracture, unspecified site, subsequent encounter for fracture with routine healing: Secondary | ICD-10-CM

## 2019-12-29 NOTE — Progress Notes (Signed)
Patient: Morgan Dennis Female    DOB: 01/17/1934   86 y.o.   MRN: [id number] Visit Date: 12/29/2019  Today's Provider: [name], MD   No chief complaint on file.  Subjective:    Virtual Visit via Video Note  I connected with Antony Contras on 12/29/19 at  2:40 PM EST by a video enabled telemedicine application and verified that I am speaking with the correct person using two identifiers.  Location: Patient: Home Provider: Home   I discussed the limitations of evaluation and management by telemedicine and the availability of in person appointments. The patient expressed understanding and agreed to proceed.   HPI    Follow up Hospitalization  Patient was admitted to [facility] on 12/09/2019 and discharged on 12/282020. She was treated for complications of covid. Treatment for this included see notes in chart. Telephone follow up was done on 12/16/2019 She reports good compliance with treatment. She reports this condition is Improved. She is now symptom-free other than fatigue. Her husband also had Covid and he is improving also.  Is a virtual visit I could see the patient and her husband and their son who reports starting and involved in the encounter.  It was necessary as the patient has moderate dementia. -----------------------------------------------------------     Allergies  Allergen Reactions  . Penicillins Swelling and Rash    Other reaction(s): Rash Has patient had a PCN reaction causing immediate rash, facial/tongue/throat swelling, SOB or lightheadedness with hypotension: Yes Has patient had a PCN reaction causing severe rash involving mucus membranes or skin necrosis: Unknown Has patient had a PCN reaction that required hospitalization:Yes Has patient had a PCN reaction occurring within the last 10 years: No If all of the above answers are "NO", then may proceed with Cephalosporin use.   . Evista  [Raloxifene]     Other reaction(s): Joint Pains  .  Ibandronic Acid     Other reaction(s): Muscle Pain  . Remicade [Infliximab] Other (See Comments)    Due to bleeding ulcer issue--MD advised her not to take  . Risedronate Sodium     Other reaction(s): Joint Pains     Current Outpatient Medications:  .  abatacept (ORENCIA) 250 MG injection, Inject 125 mg into the vein every 30 (thirty) days. , Disp: , Rfl:  .  acetaminophen (TYLENOL) 325 MG tablet, Take 2 tablets (650 mg total) by mouth every 6 (six) hours as needed for mild pain (or Fever >/= 101)., Disp: , Rfl:  .  amLODipine (NORVASC) 5 MG tablet, Take 1 tablet (5 mg total) by mouth daily., Disp: 30 tablet, Rfl: 1 .  ascorbic acid (VITAMIN C) 1000 MG tablet, Take 1,000 mg by mouth daily., Disp: , Rfl:  .  ascorbic acid (VITAMIN C) 500 MG tablet, Take 1 tablet (500 mg total) by mouth daily., Disp: 30 tablet, Rfl: 0 .  CALCIUM PO, Take 2,000 mg by mouth daily., Disp: , Rfl:  .  carbidopa-levodopa (SINEMET CR) 50-200 MG tablet, Take 1 tablet by mouth at bedtime., Disp: , Rfl:  .  carbidopa-levodopa (SINEMET IR) 25-100 MG tablet, Take 1.5 tablets by mouth 3 (three) times daily. , Disp: , Rfl:  .  Cholecalciferol (VITAMIN D3) 2000 UNITS TABS, Take 2,000 Units by mouth daily. , Disp: , Rfl:  .  denosumab (PROLIA) 60 MG/ML SOSY injection, Inject 60 mg into the skin every 6 (six) months., Disp: , Rfl:  .  docusate sodium (COLACE) 100 MG capsule,  Take 100 mg by mouth daily as needed. , Disp: , Rfl:  .  donepezil (ARICEPT) 10 MG tablet, Take 10 mg by mouth daily. , Disp: , Rfl:  .  DULoxetine (CYMBALTA) 60 MG capsule, Take 60 mg by mouth daily., Disp: , Rfl:  .  folic acid (FOLVITE) 1 MG tablet, Take 1 mg by mouth daily., Disp: , Rfl:  .  gabapentin (NEURONTIN) 600 MG tablet, Take 1 tablet (600 mg total) by mouth 2 (two) times daily., Disp: 60 tablet, Rfl: 11 .  guaiFENesin-dextromethorphan (ROBITUSSIN DM) 100-10 MG/5ML syrup, Take 10 mLs by mouth every 4 (four) hours as needed for cough., Disp: 118  mL, Rfl: 0 .  HYDROcodone-acetaminophen (NORCO) 7.5-325 MG tablet, Take 0.5-1 tablets by mouth 2 (two) times daily as needed for moderate pain., Disp: , Rfl:  .  ketorolac (TORADOL) 10 MG tablet, Take 10 mg by mouth every 8 (eight) hours as needed (pain). , Disp: , Rfl:  .  Melatonin 5 MG TABS, Take 5 mg by mouth at bedtime. , Disp: , Rfl:  .  memantine (NAMENDA) 10 MG tablet, Take 10 mg by mouth 2 (two) times daily., Disp: , Rfl:  .  methotrexate 2.5 MG tablet, Take 15 mg by mouth every Wednesday. , Disp: , Rfl:  .  Multiple Vitamin (MULTIVITAMIN WITH MINERALS) TABS tablet, Take 1 tablet by mouth daily., Disp: , Rfl:  .  Omega-3 Fatty Acids (FISH OIL) 1000 MG CAPS, Take 1 capsule (1,000 mg total) by mouth daily., Disp: , Rfl: 0 .  pregabalin (LYRICA) 150 MG capsule, Take 1 capsule by mouth 2 (two) times daily., Disp: , Rfl:  .  rOPINIRole (REQUIP) 0.25 MG tablet, Take 1 tablet by mouth 4 (four) times daily., Disp: , Rfl:  .  solifenacin (VESICARE) 5 MG tablet, Take 1 tablet (5 mg total) by mouth daily., Disp: 30 tablet, Rfl: 11 .  traMADol (ULTRAM) 50 MG tablet, Take 50 mg by mouth every 12 (twelve) hours as needed. , Disp: , Rfl:  .  vitamin E 400 UNIT capsule, Take 400 Units by mouth daily., Disp: , Rfl:  .  zinc sulfate 220 (50 Zn) MG capsule, Take 1 capsule (220 mg total) by mouth daily., Disp: 30 capsule, Rfl: 0  Review of Systems  Constitutional: Negative for appetite change, chills, fatigue and fever.  Respiratory: Negative for chest tightness and shortness of breath.   Cardiovascular: Negative for chest pain and palpitations.  Gastrointestinal: Negative for abdominal pain, nausea and vomiting.  Neurological: Negative for dizziness and weakness.    Social History   Tobacco Use  . Smoking status: Never Smoker  . Smokeless tobacco: Never Used  Substance Use Topics  . Alcohol use: No      Objective:   There were no vitals taken for this visit. There were no vitals filed for  this visit.There is no height or weight on file to calculate BMI.   Physical Exam   No results found for any visits on 12/29/19.     Assessment & Plan     1. Acute respiratory failure due to COVID-19 [facility]) Breathing fine.  Patient is clinically recovered other than fatigue.  2. Malaise and fatigue Advised patient and family it may take several weeks to months to recover from take due to Covid.  3. Dementia without behavioral disturbance, unspecified dementia type ([place]) All medications.  4. Parkinson's disease [facility]) Per neurology.  5. Rheumatoid arthritis involving multiple sites, unspecified whether rheumatoid factor present ([place])  Rheumatology  6. Osteoporosis with current pathological fracture with routine healing, unspecified osteoporosis type, subsequent encounter   I discussed the assessment and treatment plan with the patient. The patient was provided an opportunity to ask questions and all were answered. The patient agreed with the plan and demonstrated an understanding of the instructions.   The patient was advised to call back or seek an in-person evaluation if the symptoms worsen or if the condition fails to improve as anticipated.  I provided 15 minutes of non-face-to-face time during this encounter.    Richard Cranford Mon, MD  [facility]

## 2019-12-30 DIAGNOSIS — H905 Unspecified sensorineural hearing loss: Secondary | ICD-10-CM | POA: Diagnosis not present

## 2019-12-30 DIAGNOSIS — G2581 Restless legs syndrome: Secondary | ICD-10-CM | POA: Diagnosis not present

## 2019-12-30 DIAGNOSIS — J42 Unspecified chronic bronchitis: Secondary | ICD-10-CM | POA: Diagnosis not present

## 2019-12-30 DIAGNOSIS — M810 Age-related osteoporosis without current pathological fracture: Secondary | ICD-10-CM | POA: Diagnosis not present

## 2019-12-30 DIAGNOSIS — M5441 Lumbago with sciatica, right side: Secondary | ICD-10-CM | POA: Diagnosis not present

## 2019-12-30 DIAGNOSIS — R69 Illness, unspecified: Secondary | ICD-10-CM | POA: Diagnosis not present

## 2019-12-30 DIAGNOSIS — G20 Parkinson's disease: Secondary | ICD-10-CM | POA: Diagnosis not present

## 2019-12-30 DIAGNOSIS — M1990 Unspecified osteoarthritis, unspecified site: Secondary | ICD-10-CM | POA: Diagnosis not present

## 2019-12-30 DIAGNOSIS — M069 Rheumatoid arthritis, unspecified: Secondary | ICD-10-CM | POA: Diagnosis not present

## 2019-12-30 DIAGNOSIS — J189 Pneumonia, unspecified organism: Secondary | ICD-10-CM | POA: Diagnosis not present

## 2019-12-31 DIAGNOSIS — M1990 Unspecified osteoarthritis, unspecified site: Secondary | ICD-10-CM | POA: Diagnosis not present

## 2019-12-31 DIAGNOSIS — G2581 Restless legs syndrome: Secondary | ICD-10-CM | POA: Diagnosis not present

## 2019-12-31 DIAGNOSIS — G20 Parkinson's disease: Secondary | ICD-10-CM | POA: Diagnosis not present

## 2019-12-31 DIAGNOSIS — M5441 Lumbago with sciatica, right side: Secondary | ICD-10-CM | POA: Diagnosis not present

## 2019-12-31 DIAGNOSIS — J189 Pneumonia, unspecified organism: Secondary | ICD-10-CM | POA: Diagnosis not present

## 2019-12-31 DIAGNOSIS — M810 Age-related osteoporosis without current pathological fracture: Secondary | ICD-10-CM | POA: Diagnosis not present

## 2019-12-31 DIAGNOSIS — M069 Rheumatoid arthritis, unspecified: Secondary | ICD-10-CM | POA: Diagnosis not present

## 2019-12-31 DIAGNOSIS — J42 Unspecified chronic bronchitis: Secondary | ICD-10-CM | POA: Diagnosis not present

## 2019-12-31 DIAGNOSIS — R69 Illness, unspecified: Secondary | ICD-10-CM | POA: Diagnosis not present

## 2019-12-31 DIAGNOSIS — H905 Unspecified sensorineural hearing loss: Secondary | ICD-10-CM | POA: Diagnosis not present

## 2020-01-01 ENCOUNTER — Other Ambulatory Visit: Payer: Self-pay

## 2020-01-01 DIAGNOSIS — N390 Urinary tract infection, site not specified: Secondary | ICD-10-CM | POA: Diagnosis not present

## 2020-01-01 DIAGNOSIS — J189 Pneumonia, unspecified organism: Secondary | ICD-10-CM | POA: Diagnosis not present

## 2020-01-01 DIAGNOSIS — M069 Rheumatoid arthritis, unspecified: Secondary | ICD-10-CM | POA: Diagnosis not present

## 2020-01-01 DIAGNOSIS — U071 COVID-19: Secondary | ICD-10-CM

## 2020-01-01 DIAGNOSIS — M810 Age-related osteoporosis without current pathological fracture: Secondary | ICD-10-CM | POA: Diagnosis not present

## 2020-01-01 DIAGNOSIS — H905 Unspecified sensorineural hearing loss: Secondary | ICD-10-CM | POA: Diagnosis not present

## 2020-01-01 DIAGNOSIS — M1990 Unspecified osteoarthritis, unspecified site: Secondary | ICD-10-CM | POA: Diagnosis not present

## 2020-01-01 DIAGNOSIS — G20 Parkinson's disease: Secondary | ICD-10-CM | POA: Diagnosis not present

## 2020-01-01 DIAGNOSIS — G2581 Restless legs syndrome: Secondary | ICD-10-CM | POA: Diagnosis not present

## 2020-01-01 DIAGNOSIS — R69 Illness, unspecified: Secondary | ICD-10-CM | POA: Diagnosis not present

## 2020-01-01 DIAGNOSIS — J42 Unspecified chronic bronchitis: Secondary | ICD-10-CM | POA: Diagnosis not present

## 2020-01-01 DIAGNOSIS — M5441 Lumbago with sciatica, right side: Secondary | ICD-10-CM | POA: Diagnosis not present

## 2020-01-02 ENCOUNTER — Telehealth: Payer: Self-pay | Admitting: Family Medicine

## 2020-01-02 ENCOUNTER — Encounter: Payer: Self-pay | Admitting: Family Medicine

## 2020-01-02 DIAGNOSIS — U071 COVID-19: Secondary | ICD-10-CM | POA: Diagnosis not present

## 2020-01-02 LAB — CBC WITH DIFFERENTIAL/PLATELET
Basophils Absolute: 0 10*3/uL (ref 0.0–0.2)
Basos: 0 %
EOS (ABSOLUTE): 0.3 10*3/uL (ref 0.0–0.4)
Eos: 4 %
Hematocrit: 35.4 % (ref 34.0–46.6)
Hemoglobin: 11.7 g/dL (ref 11.1–15.9)
Immature Grans (Abs): 0 10*3/uL (ref 0.0–0.1)
Immature Granulocytes: 0 %
Lymphocytes Absolute: 2.2 10*3/uL (ref 0.7–3.1)
Lymphs: 31 %
MCH: 31.4 pg (ref 26.6–33.0)
MCHC: 33.1 g/dL (ref 31.5–35.7)
MCV: 95 fL (ref 79–97)
Monocytes Absolute: 0.9 10*3/uL (ref 0.1–0.9)
Monocytes: 13 %
Neutrophils Absolute: 3.6 10*3/uL (ref 1.4–7.0)
Neutrophils: 52 %
Platelets: 162 10*3/uL (ref 150–450)
RBC: 3.73 x10E6/uL — ABNORMAL LOW (ref 3.77–5.28)
RDW: 13.7 % (ref 11.7–15.4)
WBC: 7 10*3/uL (ref 3.4–10.8)

## 2020-01-02 LAB — COMPREHENSIVE METABOLIC PANEL
ALT: 8 IU/L (ref 0–32)
AST: 17 IU/L (ref 0–40)
Albumin/Globulin Ratio: 1.8 (ref 1.2–2.2)
Albumin: 3.6 g/dL (ref 3.6–4.6)
Alkaline Phosphatase: 72 IU/L (ref 39–117)
BUN/Creatinine Ratio: 19 (ref 12–28)
BUN: 11 mg/dL (ref 8–27)
Bilirubin Total: <0.2 mg/dL (ref 0.0–1.2)
CO2: 26 mmol/L (ref 20–29)
Calcium: 8.8 mg/dL (ref 8.7–10.3)
Chloride: 102 mmol/L (ref 96–106)
Creatinine, Ser: 0.58 mg/dL (ref 0.57–1.00)
GFR calc Af Amer: 99 mL/min/{1.73_m2} (ref >59)
GFR calc non Af Amer: 86 mL/min/{1.73_m2} (ref >59)
Globulin, Total: 2 g/dL (ref 1.5–4.5)
Glucose: 108 mg/dL — ABNORMAL HIGH (ref 65–99)
Potassium: 4.1 mmol/L (ref 3.5–5.2)
Sodium: 138 mmol/L (ref 134–144)
Total Protein: 5.6 g/dL — ABNORMAL LOW (ref 6.0–8.5)

## 2020-01-02 NOTE — Telephone Encounter (Signed)
Patient's son- Tennis Must is calling back on the phone to receive the patient results. Please advise CB- [phone]

## 2020-01-03 LAB — URINE CULTURE

## 2020-01-05 NOTE — Telephone Encounter (Signed)
Patient had home health visit and they did blood work, urine culture and chest x-ray.  Blood and urine results are back but we have not received and are waiting on the x-ray

## 2020-01-05 NOTE — Telephone Encounter (Signed)
Family has been advised of all results.

## 2020-01-05 NOTE — Telephone Encounter (Signed)
Patient's husband was advised of lab results. However still need results for chest x-ray. Please advise x-ray results?

## 2020-01-06 DIAGNOSIS — M810 Age-related osteoporosis without current pathological fracture: Secondary | ICD-10-CM | POA: Diagnosis not present

## 2020-01-06 DIAGNOSIS — M069 Rheumatoid arthritis, unspecified: Secondary | ICD-10-CM | POA: Diagnosis not present

## 2020-01-06 DIAGNOSIS — M1990 Unspecified osteoarthritis, unspecified site: Secondary | ICD-10-CM | POA: Diagnosis not present

## 2020-01-06 DIAGNOSIS — J189 Pneumonia, unspecified organism: Secondary | ICD-10-CM | POA: Diagnosis not present

## 2020-01-06 DIAGNOSIS — G2581 Restless legs syndrome: Secondary | ICD-10-CM | POA: Diagnosis not present

## 2020-01-06 DIAGNOSIS — R69 Illness, unspecified: Secondary | ICD-10-CM | POA: Diagnosis not present

## 2020-01-06 DIAGNOSIS — H905 Unspecified sensorineural hearing loss: Secondary | ICD-10-CM | POA: Diagnosis not present

## 2020-01-06 DIAGNOSIS — M5441 Lumbago with sciatica, right side: Secondary | ICD-10-CM | POA: Diagnosis not present

## 2020-01-06 DIAGNOSIS — U071 COVID-19: Secondary | ICD-10-CM | POA: Diagnosis not present

## 2020-01-06 DIAGNOSIS — J42 Unspecified chronic bronchitis: Secondary | ICD-10-CM | POA: Diagnosis not present

## 2020-01-06 DIAGNOSIS — G20 Parkinson's disease: Secondary | ICD-10-CM | POA: Diagnosis not present

## 2020-01-08 DIAGNOSIS — G2581 Restless legs syndrome: Secondary | ICD-10-CM | POA: Diagnosis not present

## 2020-01-08 DIAGNOSIS — M069 Rheumatoid arthritis, unspecified: Secondary | ICD-10-CM | POA: Diagnosis not present

## 2020-01-08 DIAGNOSIS — J42 Unspecified chronic bronchitis: Secondary | ICD-10-CM | POA: Diagnosis not present

## 2020-01-08 DIAGNOSIS — M810 Age-related osteoporosis without current pathological fracture: Secondary | ICD-10-CM | POA: Diagnosis not present

## 2020-01-08 DIAGNOSIS — M5441 Lumbago with sciatica, right side: Secondary | ICD-10-CM | POA: Diagnosis not present

## 2020-01-08 DIAGNOSIS — H905 Unspecified sensorineural hearing loss: Secondary | ICD-10-CM | POA: Diagnosis not present

## 2020-01-08 DIAGNOSIS — G20 Parkinson's disease: Secondary | ICD-10-CM | POA: Diagnosis not present

## 2020-01-08 DIAGNOSIS — J189 Pneumonia, unspecified organism: Secondary | ICD-10-CM | POA: Diagnosis not present

## 2020-01-08 DIAGNOSIS — R69 Illness, unspecified: Secondary | ICD-10-CM | POA: Diagnosis not present

## 2020-01-08 DIAGNOSIS — M1990 Unspecified osteoarthritis, unspecified site: Secondary | ICD-10-CM | POA: Diagnosis not present

## 2020-01-09 DIAGNOSIS — Z20828 Contact with and (suspected) exposure to other viral communicable diseases: Secondary | ICD-10-CM | POA: Diagnosis not present

## 2020-01-12 DIAGNOSIS — G20 Parkinson's disease: Secondary | ICD-10-CM | POA: Diagnosis not present

## 2020-01-12 DIAGNOSIS — J189 Pneumonia, unspecified organism: Secondary | ICD-10-CM | POA: Diagnosis not present

## 2020-01-12 DIAGNOSIS — M810 Age-related osteoporosis without current pathological fracture: Secondary | ICD-10-CM | POA: Diagnosis not present

## 2020-01-12 DIAGNOSIS — J42 Unspecified chronic bronchitis: Secondary | ICD-10-CM | POA: Diagnosis not present

## 2020-01-12 DIAGNOSIS — G2581 Restless legs syndrome: Secondary | ICD-10-CM | POA: Diagnosis not present

## 2020-01-12 DIAGNOSIS — M5441 Lumbago with sciatica, right side: Secondary | ICD-10-CM | POA: Diagnosis not present

## 2020-01-12 DIAGNOSIS — H905 Unspecified sensorineural hearing loss: Secondary | ICD-10-CM | POA: Diagnosis not present

## 2020-01-12 DIAGNOSIS — M1990 Unspecified osteoarthritis, unspecified site: Secondary | ICD-10-CM | POA: Diagnosis not present

## 2020-01-12 DIAGNOSIS — R69 Illness, unspecified: Secondary | ICD-10-CM | POA: Diagnosis not present

## 2020-01-12 DIAGNOSIS — M069 Rheumatoid arthritis, unspecified: Secondary | ICD-10-CM | POA: Diagnosis not present

## 2020-01-13 DIAGNOSIS — M810 Age-related osteoporosis without current pathological fracture: Secondary | ICD-10-CM | POA: Diagnosis not present

## 2020-01-13 DIAGNOSIS — M5441 Lumbago with sciatica, right side: Secondary | ICD-10-CM | POA: Diagnosis not present

## 2020-01-13 DIAGNOSIS — J189 Pneumonia, unspecified organism: Secondary | ICD-10-CM | POA: Diagnosis not present

## 2020-01-13 DIAGNOSIS — R69 Illness, unspecified: Secondary | ICD-10-CM | POA: Diagnosis not present

## 2020-01-13 DIAGNOSIS — M1990 Unspecified osteoarthritis, unspecified site: Secondary | ICD-10-CM | POA: Diagnosis not present

## 2020-01-13 DIAGNOSIS — J42 Unspecified chronic bronchitis: Secondary | ICD-10-CM | POA: Diagnosis not present

## 2020-01-13 DIAGNOSIS — G20 Parkinson's disease: Secondary | ICD-10-CM | POA: Diagnosis not present

## 2020-01-13 DIAGNOSIS — G2581 Restless legs syndrome: Secondary | ICD-10-CM | POA: Diagnosis not present

## 2020-01-13 DIAGNOSIS — M069 Rheumatoid arthritis, unspecified: Secondary | ICD-10-CM | POA: Diagnosis not present

## 2020-01-13 DIAGNOSIS — H905 Unspecified sensorineural hearing loss: Secondary | ICD-10-CM | POA: Diagnosis not present

## 2020-01-14 DIAGNOSIS — M810 Age-related osteoporosis without current pathological fracture: Secondary | ICD-10-CM | POA: Diagnosis not present

## 2020-01-14 DIAGNOSIS — H905 Unspecified sensorineural hearing loss: Secondary | ICD-10-CM | POA: Diagnosis not present

## 2020-01-14 DIAGNOSIS — M1990 Unspecified osteoarthritis, unspecified site: Secondary | ICD-10-CM | POA: Diagnosis not present

## 2020-01-14 DIAGNOSIS — G2581 Restless legs syndrome: Secondary | ICD-10-CM | POA: Diagnosis not present

## 2020-01-14 DIAGNOSIS — G20 Parkinson's disease: Secondary | ICD-10-CM | POA: Diagnosis not present

## 2020-01-14 DIAGNOSIS — J189 Pneumonia, unspecified organism: Secondary | ICD-10-CM | POA: Diagnosis not present

## 2020-01-14 DIAGNOSIS — M069 Rheumatoid arthritis, unspecified: Secondary | ICD-10-CM | POA: Diagnosis not present

## 2020-01-14 DIAGNOSIS — J42 Unspecified chronic bronchitis: Secondary | ICD-10-CM | POA: Diagnosis not present

## 2020-01-14 DIAGNOSIS — M5441 Lumbago with sciatica, right side: Secondary | ICD-10-CM | POA: Diagnosis not present

## 2020-01-14 DIAGNOSIS — R69 Illness, unspecified: Secondary | ICD-10-CM | POA: Diagnosis not present

## 2020-01-14 DIAGNOSIS — U071 COVID-19: Secondary | ICD-10-CM | POA: Diagnosis not present

## 2020-01-19 DIAGNOSIS — M069 Rheumatoid arthritis, unspecified: Secondary | ICD-10-CM | POA: Diagnosis not present

## 2020-01-19 DIAGNOSIS — M1990 Unspecified osteoarthritis, unspecified site: Secondary | ICD-10-CM | POA: Diagnosis not present

## 2020-01-19 DIAGNOSIS — M810 Age-related osteoporosis without current pathological fracture: Secondary | ICD-10-CM | POA: Diagnosis not present

## 2020-01-19 DIAGNOSIS — G20 Parkinson's disease: Secondary | ICD-10-CM | POA: Diagnosis not present

## 2020-01-19 DIAGNOSIS — G2581 Restless legs syndrome: Secondary | ICD-10-CM | POA: Diagnosis not present

## 2020-01-19 DIAGNOSIS — J189 Pneumonia, unspecified organism: Secondary | ICD-10-CM | POA: Diagnosis not present

## 2020-01-19 DIAGNOSIS — H905 Unspecified sensorineural hearing loss: Secondary | ICD-10-CM | POA: Diagnosis not present

## 2020-01-19 DIAGNOSIS — R69 Illness, unspecified: Secondary | ICD-10-CM | POA: Diagnosis not present

## 2020-01-19 DIAGNOSIS — J42 Unspecified chronic bronchitis: Secondary | ICD-10-CM | POA: Diagnosis not present

## 2020-01-19 DIAGNOSIS — M5441 Lumbago with sciatica, right side: Secondary | ICD-10-CM | POA: Diagnosis not present

## 2020-01-20 DIAGNOSIS — J42 Unspecified chronic bronchitis: Secondary | ICD-10-CM | POA: Diagnosis not present

## 2020-01-20 DIAGNOSIS — H905 Unspecified sensorineural hearing loss: Secondary | ICD-10-CM | POA: Diagnosis not present

## 2020-01-20 DIAGNOSIS — J189 Pneumonia, unspecified organism: Secondary | ICD-10-CM | POA: Diagnosis not present

## 2020-01-20 DIAGNOSIS — M069 Rheumatoid arthritis, unspecified: Secondary | ICD-10-CM | POA: Diagnosis not present

## 2020-01-20 DIAGNOSIS — G20 Parkinson's disease: Secondary | ICD-10-CM | POA: Diagnosis not present

## 2020-01-20 DIAGNOSIS — R69 Illness, unspecified: Secondary | ICD-10-CM | POA: Diagnosis not present

## 2020-01-20 DIAGNOSIS — G2581 Restless legs syndrome: Secondary | ICD-10-CM | POA: Diagnosis not present

## 2020-01-20 DIAGNOSIS — M810 Age-related osteoporosis without current pathological fracture: Secondary | ICD-10-CM | POA: Diagnosis not present

## 2020-01-20 DIAGNOSIS — M5441 Lumbago with sciatica, right side: Secondary | ICD-10-CM | POA: Diagnosis not present

## 2020-01-20 DIAGNOSIS — M1990 Unspecified osteoarthritis, unspecified site: Secondary | ICD-10-CM | POA: Diagnosis not present

## 2020-01-22 DIAGNOSIS — J189 Pneumonia, unspecified organism: Secondary | ICD-10-CM | POA: Diagnosis not present

## 2020-01-22 DIAGNOSIS — M5441 Lumbago with sciatica, right side: Secondary | ICD-10-CM | POA: Diagnosis not present

## 2020-01-22 DIAGNOSIS — M069 Rheumatoid arthritis, unspecified: Secondary | ICD-10-CM | POA: Diagnosis not present

## 2020-01-22 DIAGNOSIS — M810 Age-related osteoporosis without current pathological fracture: Secondary | ICD-10-CM | POA: Diagnosis not present

## 2020-01-22 DIAGNOSIS — R69 Illness, unspecified: Secondary | ICD-10-CM | POA: Diagnosis not present

## 2020-01-22 DIAGNOSIS — M1990 Unspecified osteoarthritis, unspecified site: Secondary | ICD-10-CM | POA: Diagnosis not present

## 2020-01-22 DIAGNOSIS — G20 Parkinson's disease: Secondary | ICD-10-CM | POA: Diagnosis not present

## 2020-01-22 DIAGNOSIS — G2581 Restless legs syndrome: Secondary | ICD-10-CM | POA: Diagnosis not present

## 2020-01-22 DIAGNOSIS — J42 Unspecified chronic bronchitis: Secondary | ICD-10-CM | POA: Diagnosis not present

## 2020-01-22 DIAGNOSIS — H905 Unspecified sensorineural hearing loss: Secondary | ICD-10-CM | POA: Diagnosis not present

## 2020-01-27 DIAGNOSIS — J42 Unspecified chronic bronchitis: Secondary | ICD-10-CM | POA: Diagnosis not present

## 2020-01-27 DIAGNOSIS — H905 Unspecified sensorineural hearing loss: Secondary | ICD-10-CM | POA: Diagnosis not present

## 2020-01-27 DIAGNOSIS — R69 Illness, unspecified: Secondary | ICD-10-CM | POA: Diagnosis not present

## 2020-01-27 DIAGNOSIS — G20 Parkinson's disease: Secondary | ICD-10-CM | POA: Diagnosis not present

## 2020-01-27 DIAGNOSIS — M069 Rheumatoid arthritis, unspecified: Secondary | ICD-10-CM | POA: Diagnosis not present

## 2020-01-27 DIAGNOSIS — G2581 Restless legs syndrome: Secondary | ICD-10-CM | POA: Diagnosis not present

## 2020-01-27 DIAGNOSIS — M1990 Unspecified osteoarthritis, unspecified site: Secondary | ICD-10-CM | POA: Diagnosis not present

## 2020-01-27 DIAGNOSIS — M810 Age-related osteoporosis without current pathological fracture: Secondary | ICD-10-CM | POA: Diagnosis not present

## 2020-01-27 DIAGNOSIS — J189 Pneumonia, unspecified organism: Secondary | ICD-10-CM | POA: Diagnosis not present

## 2020-01-27 DIAGNOSIS — M5441 Lumbago with sciatica, right side: Secondary | ICD-10-CM | POA: Diagnosis not present

## 2020-01-29 DIAGNOSIS — R69 Illness, unspecified: Secondary | ICD-10-CM | POA: Diagnosis not present

## 2020-01-29 DIAGNOSIS — M810 Age-related osteoporosis without current pathological fracture: Secondary | ICD-10-CM | POA: Diagnosis not present

## 2020-01-29 DIAGNOSIS — M1990 Unspecified osteoarthritis, unspecified site: Secondary | ICD-10-CM | POA: Diagnosis not present

## 2020-01-29 DIAGNOSIS — G2581 Restless legs syndrome: Secondary | ICD-10-CM | POA: Diagnosis not present

## 2020-01-29 DIAGNOSIS — G20 Parkinson's disease: Secondary | ICD-10-CM | POA: Diagnosis not present

## 2020-01-29 DIAGNOSIS — M5441 Lumbago with sciatica, right side: Secondary | ICD-10-CM | POA: Diagnosis not present

## 2020-01-29 DIAGNOSIS — J189 Pneumonia, unspecified organism: Secondary | ICD-10-CM | POA: Diagnosis not present

## 2020-01-29 DIAGNOSIS — J42 Unspecified chronic bronchitis: Secondary | ICD-10-CM | POA: Diagnosis not present

## 2020-01-29 DIAGNOSIS — M069 Rheumatoid arthritis, unspecified: Secondary | ICD-10-CM | POA: Diagnosis not present

## 2020-01-29 DIAGNOSIS — H905 Unspecified sensorineural hearing loss: Secondary | ICD-10-CM | POA: Diagnosis not present

## 2020-02-03 DIAGNOSIS — G2581 Restless legs syndrome: Secondary | ICD-10-CM | POA: Diagnosis not present

## 2020-02-03 DIAGNOSIS — R69 Illness, unspecified: Secondary | ICD-10-CM | POA: Diagnosis not present

## 2020-02-03 DIAGNOSIS — M810 Age-related osteoporosis without current pathological fracture: Secondary | ICD-10-CM | POA: Diagnosis not present

## 2020-02-03 DIAGNOSIS — G20 Parkinson's disease: Secondary | ICD-10-CM | POA: Diagnosis not present

## 2020-02-03 DIAGNOSIS — H905 Unspecified sensorineural hearing loss: Secondary | ICD-10-CM | POA: Diagnosis not present

## 2020-02-03 DIAGNOSIS — M1990 Unspecified osteoarthritis, unspecified site: Secondary | ICD-10-CM | POA: Diagnosis not present

## 2020-02-03 DIAGNOSIS — J189 Pneumonia, unspecified organism: Secondary | ICD-10-CM | POA: Diagnosis not present

## 2020-02-03 DIAGNOSIS — M069 Rheumatoid arthritis, unspecified: Secondary | ICD-10-CM | POA: Diagnosis not present

## 2020-02-03 DIAGNOSIS — J42 Unspecified chronic bronchitis: Secondary | ICD-10-CM | POA: Diagnosis not present

## 2020-02-03 DIAGNOSIS — M5441 Lumbago with sciatica, right side: Secondary | ICD-10-CM | POA: Diagnosis not present

## 2020-02-06 DIAGNOSIS — M5441 Lumbago with sciatica, right side: Secondary | ICD-10-CM | POA: Diagnosis not present

## 2020-02-06 DIAGNOSIS — U071 COVID-19: Secondary | ICD-10-CM | POA: Diagnosis not present

## 2020-02-06 DIAGNOSIS — M069 Rheumatoid arthritis, unspecified: Secondary | ICD-10-CM | POA: Diagnosis not present

## 2020-02-06 DIAGNOSIS — J189 Pneumonia, unspecified organism: Secondary | ICD-10-CM | POA: Diagnosis not present

## 2020-02-06 DIAGNOSIS — R69 Illness, unspecified: Secondary | ICD-10-CM | POA: Diagnosis not present

## 2020-02-06 DIAGNOSIS — M810 Age-related osteoporosis without current pathological fracture: Secondary | ICD-10-CM | POA: Diagnosis not present

## 2020-02-06 DIAGNOSIS — G2581 Restless legs syndrome: Secondary | ICD-10-CM | POA: Diagnosis not present

## 2020-02-06 DIAGNOSIS — J42 Unspecified chronic bronchitis: Secondary | ICD-10-CM | POA: Diagnosis not present

## 2020-02-06 DIAGNOSIS — G20 Parkinson's disease: Secondary | ICD-10-CM | POA: Diagnosis not present

## 2020-02-06 DIAGNOSIS — M1990 Unspecified osteoarthritis, unspecified site: Secondary | ICD-10-CM | POA: Diagnosis not present

## 2020-02-06 DIAGNOSIS — H905 Unspecified sensorineural hearing loss: Secondary | ICD-10-CM | POA: Diagnosis not present

## 2020-02-10 DIAGNOSIS — G2581 Restless legs syndrome: Secondary | ICD-10-CM | POA: Diagnosis not present

## 2020-02-10 DIAGNOSIS — M5441 Lumbago with sciatica, right side: Secondary | ICD-10-CM | POA: Diagnosis not present

## 2020-02-10 DIAGNOSIS — M1990 Unspecified osteoarthritis, unspecified site: Secondary | ICD-10-CM | POA: Diagnosis not present

## 2020-02-10 DIAGNOSIS — M069 Rheumatoid arthritis, unspecified: Secondary | ICD-10-CM | POA: Diagnosis not present

## 2020-02-10 DIAGNOSIS — H905 Unspecified sensorineural hearing loss: Secondary | ICD-10-CM | POA: Diagnosis not present

## 2020-02-10 DIAGNOSIS — M810 Age-related osteoporosis without current pathological fracture: Secondary | ICD-10-CM | POA: Diagnosis not present

## 2020-02-10 DIAGNOSIS — R69 Illness, unspecified: Secondary | ICD-10-CM | POA: Diagnosis not present

## 2020-02-10 DIAGNOSIS — J189 Pneumonia, unspecified organism: Secondary | ICD-10-CM | POA: Diagnosis not present

## 2020-02-10 DIAGNOSIS — G20 Parkinson's disease: Secondary | ICD-10-CM | POA: Diagnosis not present

## 2020-02-10 DIAGNOSIS — J42 Unspecified chronic bronchitis: Secondary | ICD-10-CM | POA: Diagnosis not present

## 2020-02-12 DIAGNOSIS — M069 Rheumatoid arthritis, unspecified: Secondary | ICD-10-CM | POA: Diagnosis not present

## 2020-02-12 DIAGNOSIS — M5441 Lumbago with sciatica, right side: Secondary | ICD-10-CM | POA: Diagnosis not present

## 2020-02-12 DIAGNOSIS — G20 Parkinson's disease: Secondary | ICD-10-CM | POA: Diagnosis not present

## 2020-02-12 DIAGNOSIS — M1990 Unspecified osteoarthritis, unspecified site: Secondary | ICD-10-CM | POA: Diagnosis not present

## 2020-02-12 DIAGNOSIS — G2581 Restless legs syndrome: Secondary | ICD-10-CM | POA: Diagnosis not present

## 2020-02-12 DIAGNOSIS — J42 Unspecified chronic bronchitis: Secondary | ICD-10-CM | POA: Diagnosis not present

## 2020-02-12 DIAGNOSIS — H905 Unspecified sensorineural hearing loss: Secondary | ICD-10-CM | POA: Diagnosis not present

## 2020-02-12 DIAGNOSIS — J189 Pneumonia, unspecified organism: Secondary | ICD-10-CM | POA: Diagnosis not present

## 2020-02-12 DIAGNOSIS — M810 Age-related osteoporosis without current pathological fracture: Secondary | ICD-10-CM | POA: Diagnosis not present

## 2020-02-12 DIAGNOSIS — R69 Illness, unspecified: Secondary | ICD-10-CM | POA: Diagnosis not present

## 2020-02-14 DIAGNOSIS — U071 COVID-19: Secondary | ICD-10-CM | POA: Diagnosis not present

## 2020-02-17 DIAGNOSIS — J42 Unspecified chronic bronchitis: Secondary | ICD-10-CM | POA: Diagnosis not present

## 2020-02-17 DIAGNOSIS — J189 Pneumonia, unspecified organism: Secondary | ICD-10-CM | POA: Diagnosis not present

## 2020-02-17 DIAGNOSIS — M1990 Unspecified osteoarthritis, unspecified site: Secondary | ICD-10-CM | POA: Diagnosis not present

## 2020-02-17 DIAGNOSIS — M069 Rheumatoid arthritis, unspecified: Secondary | ICD-10-CM | POA: Diagnosis not present

## 2020-02-17 DIAGNOSIS — M810 Age-related osteoporosis without current pathological fracture: Secondary | ICD-10-CM | POA: Diagnosis not present

## 2020-02-17 DIAGNOSIS — M5441 Lumbago with sciatica, right side: Secondary | ICD-10-CM | POA: Diagnosis not present

## 2020-02-17 DIAGNOSIS — G2581 Restless legs syndrome: Secondary | ICD-10-CM | POA: Diagnosis not present

## 2020-02-17 DIAGNOSIS — R69 Illness, unspecified: Secondary | ICD-10-CM | POA: Diagnosis not present

## 2020-02-17 DIAGNOSIS — H905 Unspecified sensorineural hearing loss: Secondary | ICD-10-CM | POA: Diagnosis not present

## 2020-02-17 DIAGNOSIS — G20 Parkinson's disease: Secondary | ICD-10-CM | POA: Diagnosis not present

## 2020-02-18 DIAGNOSIS — H905 Unspecified sensorineural hearing loss: Secondary | ICD-10-CM | POA: Diagnosis not present

## 2020-02-18 DIAGNOSIS — M810 Age-related osteoporosis without current pathological fracture: Secondary | ICD-10-CM | POA: Diagnosis not present

## 2020-02-18 DIAGNOSIS — J42 Unspecified chronic bronchitis: Secondary | ICD-10-CM | POA: Diagnosis not present

## 2020-02-18 DIAGNOSIS — G20 Parkinson's disease: Secondary | ICD-10-CM | POA: Diagnosis not present

## 2020-02-18 DIAGNOSIS — M1990 Unspecified osteoarthritis, unspecified site: Secondary | ICD-10-CM | POA: Diagnosis not present

## 2020-02-18 DIAGNOSIS — M069 Rheumatoid arthritis, unspecified: Secondary | ICD-10-CM | POA: Diagnosis not present

## 2020-02-18 DIAGNOSIS — R69 Illness, unspecified: Secondary | ICD-10-CM | POA: Diagnosis not present

## 2020-02-18 DIAGNOSIS — G2581 Restless legs syndrome: Secondary | ICD-10-CM | POA: Diagnosis not present

## 2020-02-18 DIAGNOSIS — J189 Pneumonia, unspecified organism: Secondary | ICD-10-CM | POA: Diagnosis not present

## 2020-02-18 DIAGNOSIS — M5441 Lumbago with sciatica, right side: Secondary | ICD-10-CM | POA: Diagnosis not present

## 2020-02-19 DIAGNOSIS — H905 Unspecified sensorineural hearing loss: Secondary | ICD-10-CM | POA: Diagnosis not present

## 2020-02-19 DIAGNOSIS — M1990 Unspecified osteoarthritis, unspecified site: Secondary | ICD-10-CM | POA: Diagnosis not present

## 2020-02-19 DIAGNOSIS — J42 Unspecified chronic bronchitis: Secondary | ICD-10-CM | POA: Diagnosis not present

## 2020-02-19 DIAGNOSIS — M810 Age-related osteoporosis without current pathological fracture: Secondary | ICD-10-CM | POA: Diagnosis not present

## 2020-02-19 DIAGNOSIS — G2581 Restless legs syndrome: Secondary | ICD-10-CM | POA: Diagnosis not present

## 2020-02-19 DIAGNOSIS — G20 Parkinson's disease: Secondary | ICD-10-CM | POA: Diagnosis not present

## 2020-02-19 DIAGNOSIS — J189 Pneumonia, unspecified organism: Secondary | ICD-10-CM | POA: Diagnosis not present

## 2020-02-19 DIAGNOSIS — M5441 Lumbago with sciatica, right side: Secondary | ICD-10-CM | POA: Diagnosis not present

## 2020-02-19 DIAGNOSIS — M069 Rheumatoid arthritis, unspecified: Secondary | ICD-10-CM | POA: Diagnosis not present

## 2020-02-19 DIAGNOSIS — R69 Illness, unspecified: Secondary | ICD-10-CM | POA: Diagnosis not present

## 2020-02-27 ENCOUNTER — Emergency Department
Admission: EM | Admit: 2020-02-27 | Discharge: 2020-02-27 | Disposition: A | Discharge status: Home / Self Care | Payer: Medicare HMO | Attending: Student | Admitting: Student

## 2020-02-27 ENCOUNTER — Emergency Department: Payer: Medicare HMO

## 2020-02-27 ENCOUNTER — Other Ambulatory Visit: Payer: Self-pay

## 2020-02-27 DIAGNOSIS — Y92003 Bedroom of unspecified non-institutional (private) residence as the place of occurrence of the external cause: Secondary | ICD-10-CM | POA: Diagnosis not present

## 2020-02-27 DIAGNOSIS — G20 Parkinson's disease: Secondary | ICD-10-CM | POA: Diagnosis not present

## 2020-02-27 DIAGNOSIS — I10 Essential (primary) hypertension: Secondary | ICD-10-CM | POA: Diagnosis not present

## 2020-02-27 DIAGNOSIS — Y999 Unspecified external cause status: Secondary | ICD-10-CM | POA: Insufficient documentation

## 2020-02-27 DIAGNOSIS — F0390 Unspecified dementia without behavioral disturbance: Secondary | ICD-10-CM | POA: Insufficient documentation

## 2020-02-27 DIAGNOSIS — R519 Headache, unspecified: Secondary | ICD-10-CM | POA: Diagnosis not present

## 2020-02-27 DIAGNOSIS — S0990XA Unspecified injury of head, initial encounter: Secondary | ICD-10-CM | POA: Diagnosis not present

## 2020-02-27 DIAGNOSIS — R58 Hemorrhage, not elsewhere classified: Secondary | ICD-10-CM | POA: Diagnosis not present

## 2020-02-27 DIAGNOSIS — Y9301 Activity, walking, marching and hiking: Secondary | ICD-10-CM | POA: Insufficient documentation

## 2020-02-27 DIAGNOSIS — Z79899 Other long term (current) drug therapy: Secondary | ICD-10-CM | POA: Diagnosis not present

## 2020-02-27 DIAGNOSIS — R69 Illness, unspecified: Secondary | ICD-10-CM | POA: Diagnosis not present

## 2020-02-27 DIAGNOSIS — S0081XA Abrasion of other part of head, initial encounter: Secondary | ICD-10-CM | POA: Diagnosis not present

## 2020-02-27 DIAGNOSIS — W010XXA Fall on same level from slipping, tripping and stumbling without subsequent striking against object, initial encounter: Secondary | ICD-10-CM | POA: Insufficient documentation

## 2020-02-27 DIAGNOSIS — Z23 Encounter for immunization: Secondary | ICD-10-CM | POA: Diagnosis not present

## 2020-02-27 DIAGNOSIS — R0902 Hypoxemia: Secondary | ICD-10-CM | POA: Diagnosis not present

## 2020-02-27 DIAGNOSIS — Z96652 Presence of left artificial knee joint: Secondary | ICD-10-CM | POA: Insufficient documentation

## 2020-02-27 DIAGNOSIS — W19XXXA Unspecified fall, initial encounter: Secondary | ICD-10-CM

## 2020-02-27 DIAGNOSIS — S199XXA Unspecified injury of neck, initial encounter: Secondary | ICD-10-CM | POA: Diagnosis not present

## 2020-02-27 MED ORDER — TETANUS-DIPHTH-ACELL PERTUSSIS 5-2.5-18.5 LF-MCG/0.5 IM SUSP
0.5000 mL | Freq: Once | INTRAMUSCULAR | Status: AC
  Administered 2020-02-27: 0.5 mL via INTRAMUSCULAR
  Filled 2020-02-27: qty 0.5

## 2020-02-27 NOTE — ED Triage Notes (Signed)
Patient arrives via EMS after a fall this morning in the bathroom. Patient was walking and fell/hit her head on bathtub. Patient has dementia, lives at home with husband. Patient reports she didn't lose consciousness. Patient has small laceration on forehead

## 2020-02-27 NOTE — ED Notes (Signed)
Pt states she had mechanical fall this morning, no LOC, no blood thinners. Pt has lac to forehead and c/o headache, no other pain. Pt has dementia, husband at bedside

## 2020-02-27 NOTE — Discharge Instructions (Addendum)
Thank you for letting us take care of you in the emergency department today. Keep your abrasion area clean and dry for the next 24 hours. After this you may bathe like usual.   Please continue to take any regular, prescribed medications.   Please follow up with: Your primary care doctor to review your ER visit and follow up on your symptoms.   Please return to the ER for any new or worsening symptoms.
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
## 2020-03-02 DIAGNOSIS — M0579 Rheumatoid arthritis with rheumatoid factor of multiple sites without organ or systems involvement: Secondary | ICD-10-CM | POA: Diagnosis not present

## 2020-03-02 DIAGNOSIS — Z79899 Other long term (current) drug therapy: Secondary | ICD-10-CM | POA: Diagnosis not present

## 2020-03-05 DIAGNOSIS — U071 COVID-19: Secondary | ICD-10-CM | POA: Diagnosis not present

## 2020-03-08 ENCOUNTER — Other Ambulatory Visit: Payer: Self-pay | Admitting: Family Medicine

## 2020-03-08 DIAGNOSIS — N3281 Overactive bladder: Secondary | ICD-10-CM

## 2020-03-08 NOTE — Progress Notes (Signed)
Patient: Morgan Dennis Female    DOB: 09/14/1934   86 y.o.   MRN: [id number] Visit Date: 03/09/2020  Today's Provider: [name], MD   No chief complaint on file.  Subjective:     HPI  Patient is married and her dementia is getting worse.  Her husband has always cared for her and he is now in failing health. Patient's son A J, brought her in to have provider look in her mouth. A J states patient picks at her mouth all the time. Patient says there is something in her teeth.   Patient also has  leg pain.  On further questioning the pain is present but intermittent.  It is not related to weightbearing.  It is difficult to figure out this is muscular or in her joints.  Her complaints of this are vague. She is poor historian. Long h/o RA. She has had no recent falls but she is slowly getting weaker over time. She takes her medications as prescribed.  Patient had Covid in December but is recovered nicely. Allergies  Allergen Reactions  . Penicillins Swelling and Rash    Other reaction(s): Rash Has patient had a PCN reaction causing immediate rash, facial/tongue/throat swelling, SOB or lightheadedness with hypotension: Yes Has patient had a PCN reaction causing severe rash involving mucus membranes or skin necrosis: Unknown Has patient had a PCN reaction that required hospitalization:Yes Has patient had a PCN reaction occurring within the last 10 years: No If all of the above answers are "NO", then may proceed with Cephalosporin use.   . Evista  [Raloxifene]     Other reaction(s): Joint Pains  . Ibandronic Acid     Other reaction(s): Muscle Pain  . Remicade [Infliximab] Other (See Comments)    Due to bleeding ulcer issue--MD advised her not to take  . Risedronate Sodium     Other reaction(s): Joint Pains     Current Outpatient Medications:  .  abatacept (ORENCIA) 250 MG injection, Inject 125 mg into the vein every 30 (thirty) days. Due for next dose 03/02/20  (Tuesday), Disp: , Rfl:  .  acetaminophen (TYLENOL) 325 MG tablet, Take 2 tablets (650 mg total) by mouth every 6 (six) hours as needed for mild pain (or Fever >/= 101)., Disp: , Rfl:  .  amLODipine (NORVASC) 5 MG tablet, Take 1 tablet (5 mg total) by mouth daily., Disp: 30 tablet, Rfl: 1 .  ascorbic acid (VITAMIN C) 1000 MG tablet, Take 1,000 mg by mouth daily., Disp: , Rfl:  .  ascorbic acid (VITAMIN C) 500 MG tablet, Take 1 tablet (500 mg total) by mouth daily., Disp: 30 tablet, Rfl: 0 .  CALCIUM PO, Take 2,000 mg by mouth daily., Disp: , Rfl:  .  carbidopa-levodopa (SINEMET CR) 50-200 MG tablet, Take 1 tablet by mouth at bedtime., Disp: , Rfl:  .  carbidopa-levodopa (SINEMET IR) 25-100 MG tablet, Take 1.5 tablets by mouth 3 (three) times daily. , Disp: , Rfl:  .  Cholecalciferol (VITAMIN D3) 2000 UNITS TABS, Take 2,000 Units by mouth daily. , Disp: , Rfl:  .  denosumab (PROLIA) 60 MG/ML SOSY injection, Inject 60 mg into the skin every 6 (six) months., Disp: , Rfl:  .  docusate sodium (COLACE) 100 MG capsule, Take 100 mg by mouth daily as needed. , Disp: , Rfl:  .  donepezil (ARICEPT) 10 MG tablet, Take 10 mg by mouth daily. , Disp: , Rfl:  .  DULoxetine (  CYMBALTA) 60 MG capsule, Take 60 mg by mouth daily., Disp: , Rfl:  .  folic acid (FOLVITE) 1 MG tablet, Take 1 mg by mouth daily., Disp: , Rfl:  .  gabapentin (NEURONTIN) 600 MG tablet, Take 1 tablet (600 mg total) by mouth 2 (two) times daily., Disp: 60 tablet, Rfl: 11 .  HYDROcodone-acetaminophen (NORCO) 7.5-325 MG tablet, Take 0.5-1 tablets by mouth 2 (two) times daily as needed for moderate pain., Disp: , Rfl:  .  Melatonin 5 MG TABS, Take 5 mg by mouth at bedtime. , Disp: , Rfl:  .  memantine (NAMENDA) 10 MG tablet, Take 10 mg by mouth 2 (two) times daily., Disp: , Rfl:  .  methotrexate 2.5 MG tablet, Take 15 mg by mouth every Wednesday. , Disp: , Rfl:  .  Multiple Vitamin (MULTIVITAMIN WITH MINERALS) TABS tablet, Take 1 tablet by mouth  daily., Disp: , Rfl:  .  polyethylene glycol (MIRALAX / GLYCOLAX) 17 g packet, Take 17 g by mouth daily as needed., Disp: , Rfl:  .  solifenacin (VESICARE) 5 MG tablet, Take 1 tablet (5 mg total) by mouth daily., Disp: 30 tablet, Rfl: 0 .  traMADol (ULTRAM) 50 MG tablet, Take 50 mg by mouth every 12 (twelve) hours as needed. , Disp: , Rfl:  .  vitamin E 400 UNIT capsule, Take 400 Units by mouth daily., Disp: , Rfl:  .  zinc sulfate 220 (50 Zn) MG capsule, Take 1 capsule (220 mg total) by mouth daily. (Patient not taking: Reported on 02/27/2020), Disp: 30 capsule, Rfl: 0  Review of Systems  Constitutional: Negative for appetite change, chills, fatigue and fever.  HENT: Negative.   Eyes: Negative.   Respiratory: Negative for chest tightness and shortness of breath.   Cardiovascular: Negative for chest pain and palpitations.  Gastrointestinal: Negative for abdominal pain, nausea and vomiting.  Endocrine: Negative.   Musculoskeletal: Positive for arthralgias.  Allergic/Immunologic: Negative.   Neurological: Negative for dizziness and weakness.       Memory/cognitive issues  Psychiatric/Behavioral: Negative.     Social History   Tobacco Use  . Smoking status: Never Smoker  . Smokeless tobacco: Never Used  Substance Use Topics  . Alcohol use: No      Objective:   BP 108/66 (BP Location: Left Arm, Patient Position: Sitting, Cuff Size: Large)   Pulse 77   Temp (!) 96.9 F (36.1 C) (Other (Comment))   Resp 18   Ht 5\' 2"  (1.575 m)   Wt 163 lb (73.9 kg)   SpO2 93%   BMI 29.81 kg/m  Vitals:   03/09/20 0912  BP: 108/66  Pulse: 77  Resp: 18  Temp: (!) 96.9 F (36.1 C)  TempSrc: Other (Comment)  SpO2: 93%  Weight: 163 lb (73.9 kg)  Height: 5\' 2"  (1.575 m)  Body mass index is 29.81 kg/m.   Physical Exam Vitals reviewed.  Constitutional:      Appearance: Normal appearance.  HENT:     Head: Normocephalic and atraumatic.     Right Ear: External ear normal.     Left Ear:  External ear normal.     Mouth/Throat:     Mouth: Mucous membranes are dry.     Pharynx: Oropharynx is clear. No oropharyngeal exudate.  Eyes:     General: No scleral icterus.    Conjunctiva/sclera: Conjunctivae normal.  Neck:     Vascular: No carotid bruit.  Cardiovascular:     Rate and Rhythm: Normal rate and regular rhythm.  Heart sounds: Normal heart sounds.  Pulmonary:     Effort: Pulmonary effort is normal.     Breath sounds: Normal breath sounds.  Abdominal:     Palpations: Abdomen is soft.  Lymphadenopathy:     Cervical: No cervical adenopathy.  Skin:    General: Skin is warm and dry.  Neurological:     General: No focal deficit present.     Mental Status: She is alert.  Psychiatric:        Mood and Affect: Mood normal.        Behavior: Behavior normal.      No results found for any visits on 03/09/20.     Assessment & Plan    1. Dementia without behavioral disturbance, unspecified dementia type ([place]) MMSE on next visit. On donepezil and Namenda 2. Parkinson's disease ([place]) Clinically stable.  On Sinemet. Family is trying to manage fall risk. 3. Malaise and fatigue   4. Rheumatoid arthritis involving multiple sites, unspecified whether rheumatoid factor present ([place]) Followed by rheumatology.  5. Fungal infection of toenail   6. Aortic atherosclerosis ([facility]) Risk factors treated.  Patient has no clinical presentation of vascular disease 7.  Osteoporosis On Prolia    [name], MD  [facility]

## 2020-03-09 ENCOUNTER — Other Ambulatory Visit: Payer: Self-pay

## 2020-03-09 ENCOUNTER — Encounter: Payer: Self-pay | Admitting: Family Medicine

## 2020-03-09 ENCOUNTER — Ambulatory Visit (INDEPENDENT_AMBULATORY_CARE_PROVIDER_SITE_OTHER): Payer: Medicare HMO | Admitting: Family Medicine

## 2020-03-09 VITALS — BP 108/66 | HR 77 | Temp 96.9°F | Resp 18 | Ht 62.0 in | Wt 163.0 lb

## 2020-03-09 DIAGNOSIS — R69 Illness, unspecified: Secondary | ICD-10-CM | POA: Diagnosis not present

## 2020-03-09 DIAGNOSIS — M069 Rheumatoid arthritis, unspecified: Secondary | ICD-10-CM | POA: Diagnosis not present

## 2020-03-09 DIAGNOSIS — R5381 Other malaise: Secondary | ICD-10-CM | POA: Diagnosis not present

## 2020-03-09 DIAGNOSIS — B351 Tinea unguium: Secondary | ICD-10-CM | POA: Diagnosis not present

## 2020-03-09 DIAGNOSIS — G20 Parkinson's disease: Secondary | ICD-10-CM | POA: Diagnosis not present

## 2020-03-09 DIAGNOSIS — I700 Atherosclerosis of aorta: Secondary | ICD-10-CM | POA: Diagnosis not present

## 2020-03-09 DIAGNOSIS — R5383 Other fatigue: Secondary | ICD-10-CM | POA: Diagnosis not present

## 2020-03-09 DIAGNOSIS — F0390 Unspecified dementia without behavioral disturbance: Secondary | ICD-10-CM

## 2020-03-09 DIAGNOSIS — M8000XD Age-related osteoporosis with current pathological fracture, unspecified site, subsequent encounter for fracture with routine healing: Secondary | ICD-10-CM | POA: Diagnosis not present

## 2020-03-13 DIAGNOSIS — U071 COVID-19: Secondary | ICD-10-CM | POA: Diagnosis not present

## 2020-03-29 DIAGNOSIS — Z961 Presence of intraocular lens: Secondary | ICD-10-CM | POA: Diagnosis not present

## 2020-03-29 DIAGNOSIS — H26493 Other secondary cataract, bilateral: Secondary | ICD-10-CM | POA: Diagnosis not present

## 2020-03-29 DIAGNOSIS — H04129 Dry eye syndrome of unspecified lacrimal gland: Secondary | ICD-10-CM | POA: Diagnosis not present

## 2020-03-30 DIAGNOSIS — M0589 Other rheumatoid arthritis with rheumatoid factor of multiple sites: Secondary | ICD-10-CM | POA: Diagnosis not present

## 2020-03-30 DIAGNOSIS — M810 Age-related osteoporosis without current pathological fracture: Secondary | ICD-10-CM | POA: Diagnosis not present

## 2020-03-30 DIAGNOSIS — M0579 Rheumatoid arthritis with rheumatoid factor of multiple sites without organ or systems involvement: Secondary | ICD-10-CM | POA: Diagnosis not present

## 2020-03-30 DIAGNOSIS — M5416 Radiculopathy, lumbar region: Secondary | ICD-10-CM | POA: Diagnosis not present

## 2020-03-30 DIAGNOSIS — Z79891 Long term (current) use of opiate analgesic: Secondary | ICD-10-CM | POA: Diagnosis not present

## 2020-03-30 DIAGNOSIS — M5136 Other intervertebral disc degeneration, lumbar region: Secondary | ICD-10-CM | POA: Diagnosis not present

## 2020-03-30 DIAGNOSIS — M8588 Other specified disorders of bone density and structure, other site: Secondary | ICD-10-CM | POA: Diagnosis not present

## 2020-03-30 DIAGNOSIS — M48062 Spinal stenosis, lumbar region with neurogenic claudication: Secondary | ICD-10-CM | POA: Diagnosis not present

## 2020-04-05 DIAGNOSIS — U071 COVID-19: Secondary | ICD-10-CM | POA: Diagnosis not present

## 2020-04-08 ENCOUNTER — Ambulatory Visit: Payer: Self-pay | Admitting: Family Medicine

## 2020-04-13 DIAGNOSIS — U071 COVID-19: Secondary | ICD-10-CM | POA: Diagnosis not present

## 2020-04-19 ENCOUNTER — Telehealth: Payer: Self-pay

## 2020-04-19 DIAGNOSIS — R5381 Other malaise: Secondary | ICD-10-CM

## 2020-04-19 NOTE — Telephone Encounter (Signed)
OK for both

## 2020-04-19 NOTE — Telephone Encounter (Signed)
Dr. Rosanna Randy can you please review, I dont see a current or past referral ordered for PT/ [facility]. Also please review request for order for [facility] to pick up patients oxygen. KW

## 2020-04-19 NOTE — Telephone Encounter (Signed)
Copied from [place] [id number]. Topic: Referral - Question >> Apr 19, 2020 12:33 PM Percell Belt A wrote: Reason for CRM: pt husband called in and stated that Dr Rosanna Randy was suppose to put in a Referral for PT / [facility] health.  He would like like to check the status.  He would also like to know if an order can be sent to [facility] so the oxygen can be picked up.  He stated pt no longer needs it  Best number [phone]

## 2020-04-20 ENCOUNTER — Other Ambulatory Visit: Payer: Self-pay

## 2020-04-20 DIAGNOSIS — G20 Parkinson's disease: Secondary | ICD-10-CM

## 2020-04-20 NOTE — Telephone Encounter (Signed)
Referral placed for patient to have PT and order to be faxed off for oxygen to be discontinued.

## 2020-04-20 NOTE — Addendum Note (Signed)
Addended by: Gerald Stabs on: 04/20/2020 12:14 PM   Modules accepted: Orders

## 2020-04-20 NOTE — Progress Notes (Signed)
Referral placed to home health for PT for patient.

## 2020-04-26 ENCOUNTER — Telehealth: Payer: Self-pay

## 2020-04-26 NOTE — Telephone Encounter (Signed)
Copied from [place] [id number]. Topic: General - Inquiry >> Apr 26, 2020 11:18 AM Mathis Bud wrote: Reason for CRM: Patients son called wanting to changing patient PT to the same one she has been having which is  [facility].  Patient son did not know of the name the new PT she was referred too.  Patient and son want to continue care with [facility] home health.   Son call back Alroy Dust) [phone]

## 2020-04-27 NOTE — Telephone Encounter (Signed)
Referral was originally given to [facility] who forwarded it to [facility]. Per Santiago Glad at [employer] pt will have no co pays with [facility] but will with [facility]. I left message for pt's son Alroy Dust to return call to explain

## 2020-04-27 NOTE — Telephone Encounter (Signed)
Thanks Sarah.  I am going to close this message out.  When the son calls if we need to do anything else please let me or Dr Synthia Innocent team know. Thanks

## 2020-05-04 DIAGNOSIS — M0579 Rheumatoid arthritis with rheumatoid factor of multiple sites without organ or systems involvement: Secondary | ICD-10-CM | POA: Diagnosis not present

## 2020-05-04 DIAGNOSIS — M5416 Radiculopathy, lumbar region: Secondary | ICD-10-CM | POA: Diagnosis not present

## 2020-05-04 DIAGNOSIS — M79604 Pain in right leg: Secondary | ICD-10-CM | POA: Diagnosis not present

## 2020-05-04 DIAGNOSIS — M48062 Spinal stenosis, lumbar region with neurogenic claudication: Secondary | ICD-10-CM | POA: Diagnosis not present

## 2020-05-04 DIAGNOSIS — M79605 Pain in left leg: Secondary | ICD-10-CM | POA: Diagnosis not present

## 2020-05-04 DIAGNOSIS — M810 Age-related osteoporosis without current pathological fracture: Secondary | ICD-10-CM | POA: Diagnosis not present

## 2020-05-04 DIAGNOSIS — G20 Parkinson's disease: Secondary | ICD-10-CM | POA: Diagnosis not present

## 2020-05-04 DIAGNOSIS — M5136 Other intervertebral disc degeneration, lumbar region: Secondary | ICD-10-CM | POA: Diagnosis not present

## 2020-05-05 ENCOUNTER — Telehealth: Payer: Self-pay | Admitting: Family Medicine

## 2020-05-05 DIAGNOSIS — U071 COVID-19: Secondary | ICD-10-CM | POA: Diagnosis not present

## 2020-05-05 NOTE — Telephone Encounter (Signed)
Spoke with patient, then her daughter due to she cannot hear well.  They will call back to scheduled awv

## 2020-05-13 DIAGNOSIS — U071 COVID-19: Secondary | ICD-10-CM | POA: Diagnosis not present

## 2020-06-01 DIAGNOSIS — M5416 Radiculopathy, lumbar region: Secondary | ICD-10-CM | POA: Diagnosis not present

## 2020-06-01 DIAGNOSIS — M48062 Spinal stenosis, lumbar region with neurogenic claudication: Secondary | ICD-10-CM | POA: Diagnosis not present

## 2020-06-01 DIAGNOSIS — M5136 Other intervertebral disc degeneration, lumbar region: Secondary | ICD-10-CM | POA: Diagnosis not present

## 2020-06-05 DIAGNOSIS — U071 COVID-19: Secondary | ICD-10-CM | POA: Diagnosis not present

## 2020-06-13 DIAGNOSIS — U071 COVID-19: Secondary | ICD-10-CM | POA: Diagnosis not present

## 2020-06-22 DIAGNOSIS — M5136 Other intervertebral disc degeneration, lumbar region: Secondary | ICD-10-CM | POA: Diagnosis not present

## 2020-06-22 DIAGNOSIS — M48062 Spinal stenosis, lumbar region with neurogenic claudication: Secondary | ICD-10-CM | POA: Diagnosis not present

## 2020-06-22 DIAGNOSIS — M5416 Radiculopathy, lumbar region: Secondary | ICD-10-CM | POA: Diagnosis not present

## 2020-07-05 DIAGNOSIS — U071 COVID-19: Secondary | ICD-10-CM | POA: Diagnosis not present

## 2020-07-08 DIAGNOSIS — I700 Atherosclerosis of aorta: Secondary | ICD-10-CM | POA: Diagnosis not present

## 2020-07-08 DIAGNOSIS — G20 Parkinson's disease: Secondary | ICD-10-CM | POA: Diagnosis not present

## 2020-07-08 DIAGNOSIS — G8929 Other chronic pain: Secondary | ICD-10-CM | POA: Diagnosis not present

## 2020-07-08 DIAGNOSIS — G629 Polyneuropathy, unspecified: Secondary | ICD-10-CM | POA: Diagnosis not present

## 2020-07-08 DIAGNOSIS — M5116 Intervertebral disc disorders with radiculopathy, lumbar region: Secondary | ICD-10-CM | POA: Diagnosis not present

## 2020-07-08 DIAGNOSIS — M1990 Unspecified osteoarthritis, unspecified site: Secondary | ICD-10-CM | POA: Diagnosis not present

## 2020-07-08 DIAGNOSIS — R69 Illness, unspecified: Secondary | ICD-10-CM | POA: Diagnosis not present

## 2020-07-08 DIAGNOSIS — M48062 Spinal stenosis, lumbar region with neurogenic claudication: Secondary | ICD-10-CM | POA: Diagnosis not present

## 2020-07-08 DIAGNOSIS — M810 Age-related osteoporosis without current pathological fracture: Secondary | ICD-10-CM | POA: Diagnosis not present

## 2020-07-08 DIAGNOSIS — K5792 Diverticulitis of intestine, part unspecified, without perforation or abscess without bleeding: Secondary | ICD-10-CM | POA: Diagnosis not present

## 2020-07-09 NOTE — Progress Notes (Signed)
Annual Wellness Visit     Patient: Morgan Dennis, Female    DOB: [birth year], 86 y.o.   MRN: [id number] Visit Date: 07/14/2020  Today's Provider: [name], MD   Chief Complaint  Patient presents with  . Annual Exam   Subjective    Morgan Dennis is a 86 y.o. female who presents today for her Annual Physical Visit. She reports consuming a general diet. The patient does not participate in regular exercise at present. She generally feels well. She reports sleeping well. She does have additional problems to discuss today.  Dementia is progressive.  No behavioral disturbance. Rheumatoid arthritis followed by Dr. Jefm Bryant.  She is having no real problems with that. Screenings are done today. Colonoscopy 2013-Hemorrhoids Mammogram 2019-negative BMD-2019 Osteoporosis with history of compression fracture of L1 in 2017 Pap smear 2019-negative Immunizations up to date, including both Covid-19 vaccines   Medications: Outpatient Medications Prior to Visit  Medication Sig  . abatacept (ORENCIA) 250 MG injection Inject 125 mg into the vein every 30 (thirty) days. Due for next dose 03/02/20 (Tuesday)  . acetaminophen (TYLENOL) 325 MG tablet Take 2 tablets (650 mg total) by mouth every 6 (six) hours as needed for mild pain (or Fever >/= 101).  Marland Kitchen amLODipine (NORVASC) 5 MG tablet Take 1 tablet (5 mg total) by mouth daily.  Marland Kitchen ascorbic acid (VITAMIN C) 1000 MG tablet Take 1,000 mg by mouth daily.  Marland Kitchen ascorbic acid (VITAMIN C) 500 MG tablet Take 1 tablet (500 mg total) by mouth daily.  Marland Kitchen CALCIUM PO Take 2,000 mg by mouth daily.  . carbidopa-levodopa (SINEMET CR) 50-200 MG tablet Take 1 tablet by mouth at bedtime.  . carbidopa-levodopa (SINEMET IR) 25-100 MG tablet Take 1.5 tablets by mouth 3 (three) times daily.   . Cholecalciferol (VITAMIN D3) 2000 UNITS TABS Take 2,000 Units by mouth daily.   Marland Kitchen denosumab (PROLIA) 60 MG/ML SOSY injection Inject 60 mg into the skin every 6 (six) months.  .  docusate sodium (COLACE) 100 MG capsule Take 100 mg by mouth daily as needed.   . donepezil (ARICEPT) 10 MG tablet Take 10 mg by mouth daily.   . DULoxetine (CYMBALTA) 60 MG capsule Take 60 mg by mouth daily.  . folic acid (FOLVITE) 1 MG tablet Take 1 mg by mouth daily.  Marland Kitchen gabapentin (NEURONTIN) 600 MG tablet Take 1 tablet (600 mg total) by mouth 2 (two) times daily.  Marland Kitchen HYDROcodone-acetaminophen (NORCO) 7.5-325 MG tablet Take 0.5-1 tablets by mouth 2 (two) times daily as needed for moderate pain.  . Melatonin 5 MG TABS Take 5 mg by mouth at bedtime.   . memantine (NAMENDA) 10 MG tablet Take 10 mg by mouth 2 (two) times daily.  . methotrexate 2.5 MG tablet Take 15 mg by mouth every Wednesday.   . Multiple Vitamin (MULTIVITAMIN WITH MINERALS) TABS tablet Take 1 tablet by mouth daily.  . polyethylene glycol (MIRALAX / GLYCOLAX) 17 g packet Take 17 g by mouth daily as needed.  . solifenacin (VESICARE) 5 MG tablet Take 1 tablet (5 mg total) by mouth daily.  . vitamin E 400 UNIT capsule Take 400 Units by mouth daily.  Marland Kitchen zinc sulfate 220 (50 Zn) MG capsule Take 1 capsule (220 mg total) by mouth daily.  . traMADol (ULTRAM) 50 MG tablet Take 50 mg by mouth every 12 (twelve) hours as needed.  (Patient not taking: Reported on 07/14/2020)   No facility-administered medications prior to visit.  Allergies  Allergen Reactions  . Penicillins Swelling and Rash    Other reaction(s): Rash Has patient had a PCN reaction causing immediate rash, facial/tongue/throat swelling, SOB or lightheadedness with hypotension: Yes Has patient had a PCN reaction causing severe rash involving mucus membranes or skin necrosis: Unknown Has patient had a PCN reaction that required hospitalization:Yes Has patient had a PCN reaction occurring within the last 10 years: No If all of the above answers are "NO", then may proceed with Cephalosporin use.   . Evista  [Raloxifene]     Other reaction(s): Joint Pains  . Ibandronic  Acid     Other reaction(s): Muscle Pain  . Remicade [Infliximab] Other (See Comments)    Due to bleeding ulcer issue--MD advised her not to take  . Risedronate Sodium     Other reaction(s): Joint Pains    Patient Care Team: Jerrol Banana., MD as PCP - General (Family Medicine) Emmaline Kluver., MD as Consulting Physician (Rheumatology) Vladimir Crofts, MD as Consulting Physician (Neurology)  Review of Systems  Constitutional: Negative.   HENT: Positive for dental problem, hearing loss, sore throat and trouble swallowing.   Eyes: Negative.   Respiratory: Negative.   Cardiovascular: Positive for leg swelling.  Gastrointestinal: Negative.   Endocrine: Negative.   Genitourinary: Negative.   Musculoskeletal: Positive for arthralgias, joint swelling and myalgias.  Skin: Negative.   Allergic/Immunologic: Positive for immunocompromised state.  Neurological: Negative.   Hematological: Bruises/bleeds easily.  Psychiatric/Behavioral: Positive for confusion and decreased concentration.       Objective    Vitals: BP (!) 132/60 (BP Location: Right Arm, Patient Position: Sitting, Cuff Size: Normal)   Pulse 91   Temp (!) 96.9 F (36.1 C) (Skin)   Wt 167 lb (75.8 kg)   SpO2 95%   BMI 30.54 kg/m     Physical Exam Constitutional:      Appearance: Normal appearance. She is normal weight.  HENT:     Head: Normocephalic and atraumatic.     Right Ear: Tympanic membrane, ear canal and external ear normal.     Left Ear: Tympanic membrane, ear canal and external ear normal.     Nose: Nose normal.     Mouth/Throat:     Mouth: Mucous membranes are moist.     Pharynx: Oropharynx is clear.  Eyes:     Extraocular Movements: Extraocular movements intact.     Conjunctiva/sclera: Conjunctivae normal.     Pupils: Pupils are equal, round, and reactive to light.  Cardiovascular:     Rate and Rhythm: Normal rate and regular rhythm.     Pulses: Normal pulses.     Heart sounds:  Normal heart sounds.  Pulmonary:     Effort: Pulmonary effort is normal.     Breath sounds: Normal breath sounds.  Abdominal:     Palpations: Abdomen is soft.  Musculoskeletal:        General: Normal range of motion.     Cervical back: Normal range of motion and neck supple.  Skin:    General: Skin is warm and dry.  Neurological:     General: No focal deficit present.     Mental Status: She is alert and oriented to person, place, and time. Mental status is at baseline.  Psychiatric:        Mood and Affect: Mood normal.        Behavior: Behavior normal.        Thought Content: Thought content normal.  Judgment: Judgment normal.      Most recent functional status assessment: In your present state of health, do you have any difficulty performing the following activities: 07/14/2020  Hearing? Y  Vision? N  Difficulty concentrating or making decisions? Y  Walking or climbing stairs? Y  Dressing or bathing? Y  Doing errands, shopping? Y  Some recent data might be hidden   Most recent fall risk assessment: Fall Risk  07/14/2020  Falls in the past year? 1  Number falls in past yr: 1  Comment patient reports 5 falls  Injury with Fall? 0    Most recent depression screenings: PHQ 2/9 Scores 07/14/2020 05/27/2019  PHQ - 2 Score 4 0  PHQ- 9 Score 15 -   Most recent cognitive screening: 6CIT Screen 05/22/2018  What Year? 4 points  What month? 0 points  What time? 0 points  Count back from 20 0 points  Months in reverse 4 points  Repeat phrase 10 points  Total Score 18   Most recent Audit-C alcohol use screening Alcohol Use Disorder Test (AUDIT) 07/14/2020  1. How often do you have a drink containing alcohol? 0  2. How many drinks containing alcohol do you have on a typical day when you are drinking? 0  3. How often do you have six or more drinks on one occasion? 0  AUDIT-C Score 0  Alcohol Brief Interventions/Follow-up AUDIT Score <7 follow-up not indicated   A score of  3 or more in women, and 4 or more in men indicates increased risk for alcohol abuse, EXCEPT if all of the points are from question 1   No results found for any visits on 07/14/20.  Assessment & Plan     Annual Physicaldone today including the all of the following: Reviewed patient's Family Medical History Reviewed and updated list of patient's medical providers Assessment of cognitive impairment was done Assessed patient's functional ability Established a written schedule for health screening [place] Completed and Reviewed  Exercise Activities and Dietary recommendations Goals    . DIET - INCREASE WATER INTAKE     Recommend increasing water intake to 4-6 glasses a day.        Immunization History  Administered Date(s) Administered  . H1N1 11/06/2008  . Influenza Split 09/05/2007, 09/22/2009  . Influenza, High Dose Seasonal PF 09/13/2016  . Influenza,inj,quad, With Preservative 09/24/2018, 09/17/2019  . Pneumococcal Conjugate-13 07/19/2016  . Pneumococcal Polysaccharide-23 09/14/2008, 10/02/2013  . Td 05/03/2004, 04/06/2009  . Tdap 11/15/2011, 02/27/2020  . Zoster Recombinat (Shingrix) 06/05/2018, 11/13/2018    Health Maintenance  Topic Date Due  . COVID-19 Vaccine (1) Never done  . INFLUENZA VACCINE  07/18/2020  . DEXA SCAN  01/01/2023  . TETANUS/TDAP  02/26/2030  . PNA vac Low Risk Adult  Completed     Discussed health benefits of physical activity, and encouraged her to engage in regular exercise appropriate for her age and condition.   1. Annual physical exam Breast or pelvic or rectal exam not appropriate in this 86 year old with progressive dementia.  2. Dementia without behavioral disturbance, unspecified dementia type ([place]) Patient still lives at home with her husband. MMSE 21/30 today. 3. Parkinson's disease [place]) Per neurology Fall risk is an issue. 4. Fatigue, unspecified type Multifactorial. - CBC with Differential/Platelet -  Comprehensive metabolic panel - TSH  5. Myalgia  - CK  6. Dysuria UA is negative. - POCT urinalysis dipstick  7. Rheumatoid arthritis involving multiple sites with positive rheumatoid factor ([place])  Followed by Dr. Jefm Bryant from rheumatology.    No follow-ups on file.        Gladyse Corvin Cranford Mon, MD  [facility] [phone] (phone) [phone] (fax)  [place]

## 2020-07-13 DIAGNOSIS — I700 Atherosclerosis of aorta: Secondary | ICD-10-CM | POA: Diagnosis not present

## 2020-07-13 DIAGNOSIS — G629 Polyneuropathy, unspecified: Secondary | ICD-10-CM | POA: Diagnosis not present

## 2020-07-13 DIAGNOSIS — R69 Illness, unspecified: Secondary | ICD-10-CM | POA: Diagnosis not present

## 2020-07-13 DIAGNOSIS — M810 Age-related osteoporosis without current pathological fracture: Secondary | ICD-10-CM | POA: Diagnosis not present

## 2020-07-13 DIAGNOSIS — U071 COVID-19: Secondary | ICD-10-CM | POA: Diagnosis not present

## 2020-07-13 DIAGNOSIS — M1990 Unspecified osteoarthritis, unspecified site: Secondary | ICD-10-CM | POA: Diagnosis not present

## 2020-07-13 DIAGNOSIS — K5792 Diverticulitis of intestine, part unspecified, without perforation or abscess without bleeding: Secondary | ICD-10-CM | POA: Diagnosis not present

## 2020-07-13 DIAGNOSIS — G20 Parkinson's disease: Secondary | ICD-10-CM | POA: Diagnosis not present

## 2020-07-13 DIAGNOSIS — M5116 Intervertebral disc disorders with radiculopathy, lumbar region: Secondary | ICD-10-CM | POA: Diagnosis not present

## 2020-07-13 DIAGNOSIS — G8929 Other chronic pain: Secondary | ICD-10-CM | POA: Diagnosis not present

## 2020-07-13 DIAGNOSIS — M48062 Spinal stenosis, lumbar region with neurogenic claudication: Secondary | ICD-10-CM | POA: Diagnosis not present

## 2020-07-14 ENCOUNTER — Other Ambulatory Visit: Payer: Self-pay

## 2020-07-14 ENCOUNTER — Ambulatory Visit: Payer: Medicare HMO | Admitting: Family Medicine

## 2020-07-14 ENCOUNTER — Ambulatory Visit (INDEPENDENT_AMBULATORY_CARE_PROVIDER_SITE_OTHER): Payer: Medicare HMO | Admitting: Family Medicine

## 2020-07-14 VITALS — BP 132/60 | HR 91 | Temp 96.9°F | Wt 167.0 lb

## 2020-07-14 DIAGNOSIS — R5383 Other fatigue: Secondary | ICD-10-CM

## 2020-07-14 DIAGNOSIS — F0390 Unspecified dementia without behavioral disturbance: Secondary | ICD-10-CM

## 2020-07-14 DIAGNOSIS — G20 Parkinson's disease: Secondary | ICD-10-CM | POA: Diagnosis not present

## 2020-07-14 DIAGNOSIS — R69 Illness, unspecified: Secondary | ICD-10-CM | POA: Diagnosis not present

## 2020-07-14 DIAGNOSIS — R300 Dysuria: Secondary | ICD-10-CM

## 2020-07-14 DIAGNOSIS — M0579 Rheumatoid arthritis with rheumatoid factor of multiple sites without organ or systems involvement: Secondary | ICD-10-CM

## 2020-07-14 DIAGNOSIS — Z0000 Encounter for general adult medical examination without abnormal findings: Secondary | ICD-10-CM | POA: Diagnosis not present

## 2020-07-14 DIAGNOSIS — M791 Myalgia, unspecified site: Secondary | ICD-10-CM

## 2020-07-14 LAB — POCT URINALYSIS DIPSTICK
Bilirubin, UA: NEGATIVE
Blood, UA: NEGATIVE
Glucose, UA: NEGATIVE
Leukocytes, UA: NEGATIVE
Nitrite, UA: NEGATIVE
Protein, UA: POSITIVE — AB
Spec Grav, UA: >=1.03 — AB (ref 1.010–1.025)
Urobilinogen, UA: 0.2 E.U./dL
pH, UA: 5 (ref 5.0–8.0)

## 2020-07-15 DIAGNOSIS — M1990 Unspecified osteoarthritis, unspecified site: Secondary | ICD-10-CM | POA: Diagnosis not present

## 2020-07-15 DIAGNOSIS — M48062 Spinal stenosis, lumbar region with neurogenic claudication: Secondary | ICD-10-CM | POA: Diagnosis not present

## 2020-07-15 DIAGNOSIS — G8929 Other chronic pain: Secondary | ICD-10-CM | POA: Diagnosis not present

## 2020-07-15 DIAGNOSIS — M5116 Intervertebral disc disorders with radiculopathy, lumbar region: Secondary | ICD-10-CM | POA: Diagnosis not present

## 2020-07-15 DIAGNOSIS — M810 Age-related osteoporosis without current pathological fracture: Secondary | ICD-10-CM | POA: Diagnosis not present

## 2020-07-15 DIAGNOSIS — R69 Illness, unspecified: Secondary | ICD-10-CM | POA: Diagnosis not present

## 2020-07-15 DIAGNOSIS — K5792 Diverticulitis of intestine, part unspecified, without perforation or abscess without bleeding: Secondary | ICD-10-CM | POA: Diagnosis not present

## 2020-07-15 DIAGNOSIS — G20 Parkinson's disease: Secondary | ICD-10-CM | POA: Diagnosis not present

## 2020-07-15 DIAGNOSIS — G629 Polyneuropathy, unspecified: Secondary | ICD-10-CM | POA: Diagnosis not present

## 2020-07-15 DIAGNOSIS — I700 Atherosclerosis of aorta: Secondary | ICD-10-CM | POA: Diagnosis not present

## 2020-07-15 LAB — CBC WITH DIFFERENTIAL/PLATELET
Basophils Absolute: 0 10*3/uL (ref 0.0–0.2)
Basos: 0 %
EOS (ABSOLUTE): 0.3 10*3/uL (ref 0.0–0.4)
Eos: 4 %
Hematocrit: 39.8 % (ref 34.0–46.6)
Hemoglobin: 13.3 g/dL (ref 11.1–15.9)
Immature Grans (Abs): 0 10*3/uL (ref 0.0–0.1)
Immature Granulocytes: 0 %
Lymphocytes Absolute: 2.3 10*3/uL (ref 0.7–3.1)
Lymphs: 34 %
MCH: 33 pg (ref 26.6–33.0)
MCHC: 33.4 g/dL (ref 31.5–35.7)
MCV: 99 fL — ABNORMAL HIGH (ref 79–97)
Monocytes Absolute: 0.9 10*3/uL (ref 0.1–0.9)
Monocytes: 14 %
Neutrophils Absolute: 3.2 10*3/uL (ref 1.4–7.0)
Neutrophils: 48 %
Platelets: 184 10*3/uL (ref 150–450)
RBC: 4.03 x10E6/uL (ref 3.77–5.28)
RDW: 12.2 % (ref 11.7–15.4)
WBC: 6.7 10*3/uL (ref 3.4–10.8)

## 2020-07-15 LAB — COMPREHENSIVE METABOLIC PANEL WITH GFR
ALT: 8 IU/L (ref 0–32)
AST: 23 IU/L (ref 0–40)
Albumin/Globulin Ratio: 1.9 (ref 1.2–2.2)
Albumin: 4.3 g/dL (ref 3.6–4.6)
Alkaline Phosphatase: 67 IU/L (ref 48–121)
BUN/Creatinine Ratio: 23 (ref 12–28)
BUN: 17 mg/dL (ref 8–27)
Bilirubin Total: 0.3 mg/dL (ref 0.0–1.2)
CO2: 26 mmol/L (ref 20–29)
Calcium: 9.4 mg/dL (ref 8.7–10.3)
Chloride: 102 mmol/L (ref 96–106)
Creatinine, Ser: 0.74 mg/dL (ref 0.57–1.00)
GFR calc Af Amer: 87 mL/min/1.73
GFR calc non Af Amer: 75 mL/min/1.73
Globulin, Total: 2.3 g/dL (ref 1.5–4.5)
Glucose: 86 mg/dL (ref 65–99)
Potassium: 4.3 mmol/L (ref 3.5–5.2)
Sodium: 140 mmol/L (ref 134–144)
Total Protein: 6.6 g/dL (ref 6.0–8.5)

## 2020-07-15 LAB — CK: Total CK: 52 U/L (ref 26–161)

## 2020-07-15 LAB — TSH: TSH: 2.06 u[IU]/mL (ref 0.450–4.500)

## 2020-07-16 ENCOUNTER — Telehealth: Payer: Self-pay | Admitting: Family Medicine

## 2020-07-16 NOTE — Telephone Encounter (Signed)
Copied from [place] [phone]. Topic: Medicare AWV >> Jul 16, 2020 10:49 AM Cher Nakai R wrote: Reason for CRM: Left message for patient to call back and schedule Medicare Annual Wellness Visit (AWV) either virtually or in office.  Last AWV 05/26/2020  Please schedule at anytime with [facility] Health Advisor.

## 2020-07-20 DIAGNOSIS — G8929 Other chronic pain: Secondary | ICD-10-CM | POA: Diagnosis not present

## 2020-07-20 DIAGNOSIS — M810 Age-related osteoporosis without current pathological fracture: Secondary | ICD-10-CM | POA: Diagnosis not present

## 2020-07-20 DIAGNOSIS — M1990 Unspecified osteoarthritis, unspecified site: Secondary | ICD-10-CM | POA: Diagnosis not present

## 2020-07-20 DIAGNOSIS — G629 Polyneuropathy, unspecified: Secondary | ICD-10-CM | POA: Diagnosis not present

## 2020-07-20 DIAGNOSIS — M5116 Intervertebral disc disorders with radiculopathy, lumbar region: Secondary | ICD-10-CM | POA: Diagnosis not present

## 2020-07-20 DIAGNOSIS — R69 Illness, unspecified: Secondary | ICD-10-CM | POA: Diagnosis not present

## 2020-07-20 DIAGNOSIS — K5792 Diverticulitis of intestine, part unspecified, without perforation or abscess without bleeding: Secondary | ICD-10-CM | POA: Diagnosis not present

## 2020-07-20 DIAGNOSIS — G20 Parkinson's disease: Secondary | ICD-10-CM | POA: Diagnosis not present

## 2020-07-20 DIAGNOSIS — I700 Atherosclerosis of aorta: Secondary | ICD-10-CM | POA: Diagnosis not present

## 2020-07-20 DIAGNOSIS — M48062 Spinal stenosis, lumbar region with neurogenic claudication: Secondary | ICD-10-CM | POA: Diagnosis not present

## 2020-07-22 DIAGNOSIS — M5116 Intervertebral disc disorders with radiculopathy, lumbar region: Secondary | ICD-10-CM | POA: Diagnosis not present

## 2020-07-22 DIAGNOSIS — G8929 Other chronic pain: Secondary | ICD-10-CM | POA: Diagnosis not present

## 2020-07-22 DIAGNOSIS — G20 Parkinson's disease: Secondary | ICD-10-CM | POA: Diagnosis not present

## 2020-07-22 DIAGNOSIS — R69 Illness, unspecified: Secondary | ICD-10-CM | POA: Diagnosis not present

## 2020-07-22 DIAGNOSIS — M48062 Spinal stenosis, lumbar region with neurogenic claudication: Secondary | ICD-10-CM | POA: Diagnosis not present

## 2020-07-22 DIAGNOSIS — M810 Age-related osteoporosis without current pathological fracture: Secondary | ICD-10-CM | POA: Diagnosis not present

## 2020-07-22 DIAGNOSIS — I700 Atherosclerosis of aorta: Secondary | ICD-10-CM | POA: Diagnosis not present

## 2020-07-22 DIAGNOSIS — M1990 Unspecified osteoarthritis, unspecified site: Secondary | ICD-10-CM | POA: Diagnosis not present

## 2020-07-22 DIAGNOSIS — K5792 Diverticulitis of intestine, part unspecified, without perforation or abscess without bleeding: Secondary | ICD-10-CM | POA: Diagnosis not present

## 2020-07-22 DIAGNOSIS — G629 Polyneuropathy, unspecified: Secondary | ICD-10-CM | POA: Diagnosis not present

## 2020-07-27 DIAGNOSIS — G629 Polyneuropathy, unspecified: Secondary | ICD-10-CM | POA: Diagnosis not present

## 2020-07-27 DIAGNOSIS — I700 Atherosclerosis of aorta: Secondary | ICD-10-CM | POA: Diagnosis not present

## 2020-07-27 DIAGNOSIS — M1990 Unspecified osteoarthritis, unspecified site: Secondary | ICD-10-CM | POA: Diagnosis not present

## 2020-07-27 DIAGNOSIS — M5116 Intervertebral disc disorders with radiculopathy, lumbar region: Secondary | ICD-10-CM | POA: Diagnosis not present

## 2020-07-27 DIAGNOSIS — G20 Parkinson's disease: Secondary | ICD-10-CM | POA: Diagnosis not present

## 2020-07-27 DIAGNOSIS — R69 Illness, unspecified: Secondary | ICD-10-CM | POA: Diagnosis not present

## 2020-07-27 DIAGNOSIS — M810 Age-related osteoporosis without current pathological fracture: Secondary | ICD-10-CM | POA: Diagnosis not present

## 2020-07-27 DIAGNOSIS — G8929 Other chronic pain: Secondary | ICD-10-CM | POA: Diagnosis not present

## 2020-07-27 DIAGNOSIS — K5792 Diverticulitis of intestine, part unspecified, without perforation or abscess without bleeding: Secondary | ICD-10-CM | POA: Diagnosis not present

## 2020-07-27 DIAGNOSIS — M48062 Spinal stenosis, lumbar region with neurogenic claudication: Secondary | ICD-10-CM | POA: Diagnosis not present

## 2020-07-29 DIAGNOSIS — M1990 Unspecified osteoarthritis, unspecified site: Secondary | ICD-10-CM | POA: Diagnosis not present

## 2020-07-29 DIAGNOSIS — G20 Parkinson's disease: Secondary | ICD-10-CM | POA: Diagnosis not present

## 2020-07-29 DIAGNOSIS — I700 Atherosclerosis of aorta: Secondary | ICD-10-CM | POA: Diagnosis not present

## 2020-07-29 DIAGNOSIS — M48062 Spinal stenosis, lumbar region with neurogenic claudication: Secondary | ICD-10-CM | POA: Diagnosis not present

## 2020-07-29 DIAGNOSIS — G8929 Other chronic pain: Secondary | ICD-10-CM | POA: Diagnosis not present

## 2020-07-29 DIAGNOSIS — M810 Age-related osteoporosis without current pathological fracture: Secondary | ICD-10-CM | POA: Diagnosis not present

## 2020-07-29 DIAGNOSIS — R69 Illness, unspecified: Secondary | ICD-10-CM | POA: Diagnosis not present

## 2020-07-29 DIAGNOSIS — M5116 Intervertebral disc disorders with radiculopathy, lumbar region: Secondary | ICD-10-CM | POA: Diagnosis not present

## 2020-07-29 DIAGNOSIS — K5792 Diverticulitis of intestine, part unspecified, without perforation or abscess without bleeding: Secondary | ICD-10-CM | POA: Diagnosis not present

## 2020-07-29 DIAGNOSIS — G629 Polyneuropathy, unspecified: Secondary | ICD-10-CM | POA: Diagnosis not present

## 2020-08-04 DIAGNOSIS — K5792 Diverticulitis of intestine, part unspecified, without perforation or abscess without bleeding: Secondary | ICD-10-CM | POA: Diagnosis not present

## 2020-08-04 DIAGNOSIS — G8929 Other chronic pain: Secondary | ICD-10-CM | POA: Diagnosis not present

## 2020-08-04 DIAGNOSIS — M48062 Spinal stenosis, lumbar region with neurogenic claudication: Secondary | ICD-10-CM | POA: Diagnosis not present

## 2020-08-04 DIAGNOSIS — M810 Age-related osteoporosis without current pathological fracture: Secondary | ICD-10-CM | POA: Diagnosis not present

## 2020-08-04 DIAGNOSIS — M1990 Unspecified osteoarthritis, unspecified site: Secondary | ICD-10-CM | POA: Diagnosis not present

## 2020-08-04 DIAGNOSIS — G629 Polyneuropathy, unspecified: Secondary | ICD-10-CM | POA: Diagnosis not present

## 2020-08-04 DIAGNOSIS — G20 Parkinson's disease: Secondary | ICD-10-CM | POA: Diagnosis not present

## 2020-08-04 DIAGNOSIS — M0579 Rheumatoid arthritis with rheumatoid factor of multiple sites without organ or systems involvement: Secondary | ICD-10-CM | POA: Diagnosis not present

## 2020-08-04 DIAGNOSIS — R69 Illness, unspecified: Secondary | ICD-10-CM | POA: Diagnosis not present

## 2020-08-04 DIAGNOSIS — M5116 Intervertebral disc disorders with radiculopathy, lumbar region: Secondary | ICD-10-CM | POA: Diagnosis not present

## 2020-08-04 DIAGNOSIS — I700 Atherosclerosis of aorta: Secondary | ICD-10-CM | POA: Diagnosis not present

## 2020-08-06 DIAGNOSIS — R69 Illness, unspecified: Secondary | ICD-10-CM | POA: Diagnosis not present

## 2020-08-06 DIAGNOSIS — I700 Atherosclerosis of aorta: Secondary | ICD-10-CM | POA: Diagnosis not present

## 2020-08-06 DIAGNOSIS — G629 Polyneuropathy, unspecified: Secondary | ICD-10-CM | POA: Diagnosis not present

## 2020-08-06 DIAGNOSIS — G8929 Other chronic pain: Secondary | ICD-10-CM | POA: Diagnosis not present

## 2020-08-06 DIAGNOSIS — G20 Parkinson's disease: Secondary | ICD-10-CM | POA: Diagnosis not present

## 2020-08-06 DIAGNOSIS — M48062 Spinal stenosis, lumbar region with neurogenic claudication: Secondary | ICD-10-CM | POA: Diagnosis not present

## 2020-08-06 DIAGNOSIS — M810 Age-related osteoporosis without current pathological fracture: Secondary | ICD-10-CM | POA: Diagnosis not present

## 2020-08-06 DIAGNOSIS — M1990 Unspecified osteoarthritis, unspecified site: Secondary | ICD-10-CM | POA: Diagnosis not present

## 2020-08-06 DIAGNOSIS — M5116 Intervertebral disc disorders with radiculopathy, lumbar region: Secondary | ICD-10-CM | POA: Diagnosis not present

## 2020-08-06 DIAGNOSIS — K5792 Diverticulitis of intestine, part unspecified, without perforation or abscess without bleeding: Secondary | ICD-10-CM | POA: Diagnosis not present

## 2020-08-10 DIAGNOSIS — G629 Polyneuropathy, unspecified: Secondary | ICD-10-CM | POA: Diagnosis not present

## 2020-08-10 DIAGNOSIS — M48062 Spinal stenosis, lumbar region with neurogenic claudication: Secondary | ICD-10-CM | POA: Diagnosis not present

## 2020-08-10 DIAGNOSIS — G8929 Other chronic pain: Secondary | ICD-10-CM | POA: Diagnosis not present

## 2020-08-10 DIAGNOSIS — R69 Illness, unspecified: Secondary | ICD-10-CM | POA: Diagnosis not present

## 2020-08-10 DIAGNOSIS — G20 Parkinson's disease: Secondary | ICD-10-CM | POA: Diagnosis not present

## 2020-08-10 DIAGNOSIS — K5792 Diverticulitis of intestine, part unspecified, without perforation or abscess without bleeding: Secondary | ICD-10-CM | POA: Diagnosis not present

## 2020-08-10 DIAGNOSIS — M5116 Intervertebral disc disorders with radiculopathy, lumbar region: Secondary | ICD-10-CM | POA: Diagnosis not present

## 2020-08-10 DIAGNOSIS — I700 Atherosclerosis of aorta: Secondary | ICD-10-CM | POA: Diagnosis not present

## 2020-08-10 DIAGNOSIS — M810 Age-related osteoporosis without current pathological fracture: Secondary | ICD-10-CM | POA: Diagnosis not present

## 2020-08-10 DIAGNOSIS — M1990 Unspecified osteoarthritis, unspecified site: Secondary | ICD-10-CM | POA: Diagnosis not present

## 2020-08-14 DIAGNOSIS — I10 Essential (primary) hypertension: Secondary | ICD-10-CM | POA: Diagnosis not present

## 2020-08-14 DIAGNOSIS — G4700 Insomnia, unspecified: Secondary | ICD-10-CM | POA: Diagnosis not present

## 2020-08-14 DIAGNOSIS — G20 Parkinson's disease: Secondary | ICD-10-CM | POA: Diagnosis not present

## 2020-08-14 DIAGNOSIS — M810 Age-related osteoporosis without current pathological fracture: Secondary | ICD-10-CM | POA: Diagnosis not present

## 2020-08-14 DIAGNOSIS — K5900 Constipation, unspecified: Secondary | ICD-10-CM | POA: Diagnosis not present

## 2020-08-14 DIAGNOSIS — M1990 Unspecified osteoarthritis, unspecified site: Secondary | ICD-10-CM | POA: Diagnosis not present

## 2020-08-14 DIAGNOSIS — G8929 Other chronic pain: Secondary | ICD-10-CM | POA: Diagnosis not present

## 2020-08-14 DIAGNOSIS — K219 Gastro-esophageal reflux disease without esophagitis: Secondary | ICD-10-CM | POA: Diagnosis not present

## 2020-08-14 DIAGNOSIS — G309 Alzheimer's disease, unspecified: Secondary | ICD-10-CM | POA: Diagnosis not present

## 2020-08-14 DIAGNOSIS — M069 Rheumatoid arthritis, unspecified: Secondary | ICD-10-CM | POA: Diagnosis not present

## 2020-08-24 ENCOUNTER — Ambulatory Visit (INDEPENDENT_AMBULATORY_CARE_PROVIDER_SITE_OTHER): Payer: Medicare HMO

## 2020-08-24 ENCOUNTER — Other Ambulatory Visit: Payer: Self-pay

## 2020-08-24 DIAGNOSIS — Z0000 Encounter for general adult medical examination without abnormal findings: Secondary | ICD-10-CM | POA: Diagnosis not present
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
## 2020-08-24 NOTE — Patient Instructions (Signed)
Morgan Dennis , Thank you for taking time to come for your Medicare Wellness Visit. I appreciate your ongoing commitment to your health goals. Please review the following plan we discussed and let me know if I can assist you in the future.   Screening recommendations/referrals: Colonoscopy: No longer required.  Mammogram: No longer required.  Bone Density: Up to date, due 12/2022 Recommended yearly ophthalmology/optometry visit for glaucoma screening and checkup Recommended yearly dental visit for hygiene and checkup  Vaccinations: Influenza vaccine: Due fall 2021 Pneumococcal vaccine: Completed series Tdap vaccine: Up to date, due 02/2030 Shingles vaccine: Completed series    Advanced directives: Please bring a copy of your POA (Power of [place]) and/or Living Will to your next appointment.   Conditions/risks identified: Fall risk preventatives discussed today. Recommend to continue to increase water intake to 6-8 8 oz glasses a day.   Next appointment: 10/20/20 @ 10:40 AM with Dr Rosanna Randy. Declined scheduling an AWV for 2022 at this time.    Preventive Care 84 Years and Older, Female Preventive care refers to lifestyle choices and visits with your health care provider that can promote health and wellness. What does preventive care include?  A yearly physical exam. This is also called an annual well check.  Dental exams once or twice a year.  Routine eye exams. Ask your health care provider how often you should have your eyes checked.  Personal lifestyle choices, including:  Daily care of your teeth and gums.  Regular physical activity.  Eating a healthy diet.  Avoiding tobacco and drug use.  Limiting alcohol use.  Practicing safe sex.  Taking low-dose aspirin every day.  Taking vitamin and mineral supplements as recommended by your health care provider. What happens during an annual well check? The services and screenings done by your health care provider during your  annual well check will depend on your age, overall health, lifestyle risk factors, and family history of disease. Counseling  Your health care provider may ask you questions about your:  Alcohol use.  Tobacco use.  Drug use.  Emotional well-being.  Home and relationship well-being.  Sexual activity.  Eating habits.  History of falls.  Memory and ability to understand (cognition).  Work and work Statistician.  Reproductive health. Screening  You may have the following tests or measurements:  Height, weight, and BMI.  Blood pressure.  Lipid and cholesterol levels. These may be checked every 5 years, or more frequently if you are over 84 years old.  Skin check.  Lung cancer screening. You may have this screening every year starting at age 84 if you have a 30-pack-year history of smoking and currently smoke or have quit within the past 15 years.  Fecal occult blood test (FOBT) of the stool. You may have this test every year starting at age 84.  Flexible sigmoidoscopy or colonoscopy. You may have a sigmoidoscopy every 5 years or a colonoscopy every 10 years starting at age 84.  Hepatitis C blood test.  Hepatitis B blood test.  Sexually transmitted disease (STD) testing.  Diabetes screening. This is done by checking your blood sugar (glucose) after you have not eaten for a while (fasting). You may have this done every 1-3 years.  Bone density scan. This is done to screen for osteoporosis. You may have this done starting at age 84.  Mammogram. This may be done every 1-2 years. Talk to your health care provider about how often you should have regular mammograms. Talk with your health care  provider about your test results, treatment options, and if necessary, the need for more tests. Vaccines  Your health care provider may recommend certain vaccines, such as:  Influenza vaccine. This is recommended every year.  Tetanus, diphtheria, and acellular pertussis (Tdap, Td)  vaccine. You may need a Td booster every 10 years.  Zoster vaccine. You may need this after age 84.  Pneumococcal 13-valent conjugate (PCV13) vaccine. One dose is recommended after age 84.  Pneumococcal polysaccharide (PPSV23) vaccine. One dose is recommended after age 84. Talk to your health care provider about which screenings and vaccines you need and how often you need them. This information is not intended to replace advice given to you by your health care provider. Make sure you discuss any questions you have with your health care provider. Document Released: 12/31/2015 Document Revised: 08/23/2016 Document Reviewed: 10/05/2015 Elsevier Interactive Patient Education  2017 [place] Prevention in the Home Falls can cause injuries. They can happen to people of all ages. There are many things you can do to make your home safe and to help prevent falls. What can I do on the outside of my home?  Regularly fix the edges of walkways and driveways and fix any cracks.  Remove anything that might make you trip as you walk through a door, such as a raised step or threshold.  Trim any bushes or trees on the path to your home.  Use bright outdoor lighting.  Clear any walking paths of anything that might make someone trip, such as rocks or tools.  Regularly check to see if handrails are loose or broken. Make sure that both sides of any steps have handrails.  Any raised decks and porches should have guardrails on the edges.  Have any leaves, snow, or ice cleared regularly.  Use sand or salt on walking paths during winter.  Clean up any spills in your garage right away. This includes oil or grease spills. What can I do in the bathroom?  Use night lights.  Install grab bars by the toilet and in the tub and shower. Do not use towel bars as grab bars.  Use non-skid mats or decals in the tub or shower.  If you need to sit down in the shower, use a plastic, non-slip  stool.  Keep the floor dry. Clean up any water that spills on the floor as soon as it happens.  Remove soap buildup in the tub or shower regularly.  Attach bath mats securely with double-sided non-slip rug tape.  Do not have throw rugs and other things on the floor that can make you trip. What can I do in the bedroom?  Use night lights.  Make sure that you have a light by your bed that is easy to reach.  Do not use any sheets or blankets that are too big for your bed. They should not hang down onto the floor.  Have a firm chair that has side arms. You can use this for support while you get dressed.  Do not have throw rugs and other things on the floor that can make you trip. What can I do in the kitchen?  Clean up any spills right away.  Avoid walking on wet floors.  Keep items that you use a lot in easy-to-reach places.  If you need to reach something above you, use a strong step stool that has a grab bar.  Keep electrical cords out of the way.  Do not use floor polish  or wax that makes floors slippery. If you must use wax, use non-skid floor wax.  Do not have throw rugs and other things on the floor that can make you trip. What can I do with my stairs?  Do not leave any items on the stairs.  Make sure that there are handrails on both sides of the stairs and use them. Fix handrails that are broken or loose. Make sure that handrails are as long as the stairways.  Check any carpeting to make sure that it is firmly attached to the stairs. Fix any carpet that is loose or worn.  Avoid having throw rugs at the top or bottom of the stairs. If you do have throw rugs, attach them to the floor with carpet tape.  Make sure that you have a light switch at the top of the stairs and the bottom of the stairs. If you do not have them, ask someone to add them for you. What else can I do to help prevent falls?  Wear shoes that:  Do not have high heels.  Have rubber bottoms.  Are  comfortable and fit you well.  Are closed at the toe. Do not wear sandals.  If you use a stepladder:  Make sure that it is fully opened. Do not climb a closed stepladder.  Make sure that both sides of the stepladder are locked into place.  Ask someone to hold it for you, if possible.  Clearly mark and make sure that you can see:  Any grab bars or handrails.  First and last steps.  Where the edge of each step is.  Use tools that help you move around (mobility aids) if they are needed. These include:  Canes.  Walkers.  Scooters.  Crutches.  Turn on the lights when you go into a dark area. Replace any light bulbs as soon as they burn out.  Set up your furniture so you have a clear path. Avoid moving your furniture around.  If any of your floors are uneven, fix them.  If there are any pets around you, be aware of where they are.  Review your medicines with your doctor. Some medicines can make you feel dizzy. This can increase your chance of falling. Ask your doctor what other things that you can do to help prevent falls. This information is not intended to replace advice given to you by your health care provider. Make sure you discuss any questions you have with your health care provider. Document Released: 09/30/2009 Document Revised: 05/11/2016 Document Reviewed: 01/08/2015 Elsevier Interactive Patient Education  2017 [employer].

## 2020-08-31 DIAGNOSIS — G20 Parkinson's disease: Secondary | ICD-10-CM | POA: Diagnosis not present

## 2020-08-31 DIAGNOSIS — Z79899 Other long term (current) drug therapy: Secondary | ICD-10-CM | POA: Diagnosis not present

## 2020-08-31 DIAGNOSIS — M810 Age-related osteoporosis without current pathological fracture: Secondary | ICD-10-CM | POA: Diagnosis not present

## 2020-08-31 DIAGNOSIS — G8929 Other chronic pain: Secondary | ICD-10-CM | POA: Diagnosis not present

## 2020-08-31 DIAGNOSIS — M0579 Rheumatoid arthritis with rheumatoid factor of multiple sites without organ or systems involvement: Secondary | ICD-10-CM | POA: Diagnosis not present

## 2020-08-31 DIAGNOSIS — M545 Low back pain: Secondary | ICD-10-CM | POA: Diagnosis not present

## 2020-09-21 ENCOUNTER — Ambulatory Visit: Payer: Self-pay | Admitting: Family Medicine

## 2020-09-21 NOTE — Telephone Encounter (Signed)
Pt's son calling. [facility] just made them aware pt has had "Blood on her panties, now going through to her pajamas."  Son is not with pt. Attempted to reach pt and aide. "Call cannot be completed at this time." Several attempts made. Attempted to CB son at number he provided, busy signal, several attempts made.

## 2020-09-22 ENCOUNTER — Encounter: Payer: Self-pay | Admitting: Physician Assistant

## 2020-09-22 ENCOUNTER — Ambulatory Visit: Payer: Self-pay | Admitting: Family Medicine

## 2020-09-22 ENCOUNTER — Other Ambulatory Visit: Payer: Self-pay

## 2020-09-22 ENCOUNTER — Telehealth: Payer: Self-pay

## 2020-09-22 ENCOUNTER — Ambulatory Visit (INDEPENDENT_AMBULATORY_CARE_PROVIDER_SITE_OTHER): Payer: Medicare HMO | Admitting: Physician Assistant

## 2020-09-22 VITALS — BP 135/78 | HR 83 | Temp 98.2°F | Resp 16

## 2020-09-22 DIAGNOSIS — N3001 Acute cystitis with hematuria: Secondary | ICD-10-CM | POA: Diagnosis not present

## 2020-09-22 DIAGNOSIS — N898 Other specified noninflammatory disorders of vagina: Secondary | ICD-10-CM

## 2020-09-22 DIAGNOSIS — Z23 Encounter for immunization: Secondary | ICD-10-CM | POA: Diagnosis not present

## 2020-09-22 LAB — POCT URINALYSIS DIPSTICK
Appearance: NORMAL
Bilirubin, UA: NEGATIVE
Blood, UA: NEGATIVE
Glucose, UA: NEGATIVE
Ketones, UA: NEGATIVE
Leukocytes, UA: NEGATIVE
Nitrite, UA: NEGATIVE
Protein, UA: NEGATIVE
Spec Grav, UA: >=1.03 — AB (ref 1.010–1.025)
Urobilinogen, UA: 0.2 E.U./dL
pH, UA: 6 (ref 5.0–8.0)

## 2020-09-22 MED ORDER — SULFAMETHOXAZOLE-TRIMETHOPRIM 400-80 MG PO TABS: 1.0000 | ORAL_TABLET | Freq: Two times a day (BID) | ORAL | 0 refills | Status: DC

## 2020-09-22 NOTE — Progress Notes (Signed)
Established patient visit   Patient: Morgan Dennis   DOB: [birth year]   86 y.o. Female  MRN: [id number] Visit Date: 09/22/2020  Today's healthcare provider: Mar Daring, PA-C   Chief Complaint  Patient presents with  . Vaginal Discharge   Subjective    HPI  Patient here with her son and husband. The son reports that patient had a pink tinge discharge in underwear. Reports that the nurse aid noticed it when she was doing the laundry. Per husband he noticed today that on her pad the color was darker.  Husband has also seen what was on the pad as well. Reports it was a dark pink discharge on the "wings"of the pad is what he saw. She does have history of UTIs. Does report just "not feeling well" over the last week and getting weaker.    Patient Active Problem List   Diagnosis Date Noted  . Acute respiratory failure due to COVID-19 ([place]) 12/10/2019  . Acute respiratory failure with hypoxia ([place]) 10/19/2019  . Thrombocytopenia ([place]) 10/19/2019  . Dementia without behavioral disturbance ([place]) 10/19/2019  . Aortic atherosclerosis ([place]) 10/19/2019  . Right upper lobe pneumonia 10/17/2019  . Sepsis ([place]) 06/16/2019  . Parkinson's disease ([place]) 04/23/2017  . Skin cancer 02/12/2017  . Chronic bronchitis ([place]) 01/02/2017  . Frequency of urination and polyuria 10/12/2015  . Upper GI bleed 08/18/2015  . Adaptation reaction 04/30/2015  . Malaise and fatigue 04/30/2015  . Affective disorder, major 04/30/2015  . Bad memory 04/30/2015  . Mild cognitive disorder 04/30/2015  . Fungal infection of toenail 04/30/2015  . Arthritis, degenerative 04/30/2015  . OP (osteoporosis) 04/30/2015  . Arthritis or polyarthritis, rheumatoid ([place]) 04/30/2015  . Avitaminosis D 05/12/2014  . Trigger finger 05/12/2014  . Rheumatoid arthritis ([place]) 03/17/2014   Past Medical History:  Diagnosis Date  . Arthritis   . GERD (gastroesophageal reflux disease)   . Hyperlipidemia   . Parkinson disease  ([place])   . Restless leg syndrome        Medications: Outpatient Medications Prior to Visit  Medication Sig  . abatacept (ORENCIA) 250 MG injection Inject 125 mg into the vein every 30 (thirty) days. Due for next dose 03/02/20 (Tuesday)  . acetaminophen (TYLENOL) 325 MG tablet Take 2 tablets (650 mg total) by mouth every 6 (six) hours as needed for mild pain (or Fever >/= 101).  Marland Kitchen amLODipine (NORVASC) 5 MG tablet Take 1 tablet (5 mg total) by mouth daily.  Marland Kitchen ascorbic acid (VITAMIN C) 1000 MG tablet Take 1,000 mg by mouth daily.   Marland Kitchen ascorbic acid (VITAMIN C) 500 MG tablet Take 1 tablet (500 mg total) by mouth daily.  Marland Kitchen CALCIUM PO Take 2,000 mg by mouth daily.  . carbidopa-levodopa (SINEMET CR) 50-200 MG tablet Take 1 tablet by mouth at bedtime.  . carbidopa-levodopa (SINEMET IR) 25-100 MG tablet Take 1.5 tablets by mouth 3 (three) times daily.   . Cholecalciferol (VITAMIN D3) 2000 UNITS TABS Take 2,000 Units by mouth daily.   Marland Kitchen denosumab (PROLIA) 60 MG/ML SOSY injection Inject 60 mg into the skin every 6 (six) months.  . docusate sodium (COLACE) 100 MG capsule Take 100 mg by mouth daily as needed.   . donepezil (ARICEPT) 10 MG tablet Take 10 mg by mouth daily.   . DULoxetine (CYMBALTA) 60 MG capsule Take 60 mg by mouth daily.  . folic acid (FOLVITE) 1 MG tablet Take 1 mg by mouth daily.  Marland Kitchen gabapentin (NEURONTIN) 600  MG tablet Take 1 tablet (600 mg total) by mouth 2 (two) times daily.  Marland Kitchen HYDROcodone-acetaminophen (NORCO) 7.5-325 MG tablet Take 0.5-1 tablets by mouth 3 (three) times daily as needed for moderate pain.   . Melatonin 5 MG TABS Take 5 mg by mouth at bedtime.   . memantine (NAMENDA) 10 MG tablet Take 10 mg by mouth 2 (two) times daily.  . methotrexate 2.5 MG tablet Take 15 mg by mouth every Wednesday.   . Multiple Vitamin (MULTIVITAMIN WITH MINERALS) TABS tablet Take 1 tablet by mouth daily.  . polyethylene glycol (MIRALAX / GLYCOLAX) 17 g packet Take 17 g by mouth daily as needed.    . solifenacin (VESICARE) 5 MG tablet Take 1 tablet (5 mg total) by mouth daily.  . vitamin E 400 UNIT capsule Take 400 Units by mouth daily.  Marland Kitchen zinc sulfate 220 (50 Zn) MG capsule Take 1 capsule (220 mg total) by mouth daily.  . traMADol (ULTRAM) 50 MG tablet Take 50 mg by mouth every 12 (twelve) hours as needed.  (Patient not taking: Reported on 08/24/2020)   No facility-administered medications prior to visit.    Review of Systems  Constitutional: Positive for activity change and fatigue.  HENT: Negative.   Respiratory: Negative.   Cardiovascular: Negative.   Gastrointestinal: Negative for abdominal pain, constipation, diarrhea and nausea.  Genitourinary: Positive for vaginal bleeding (possible? seen on pad). Negative for dysuria, flank pain, pelvic pain, urgency, vaginal discharge and vaginal pain.    Last CBC Lab Results  Component Value Date   WBC 7.1 09/22/2020   HGB 13.7 09/22/2020   HCT 40.6 09/22/2020   MCV 98 (H) 09/22/2020   MCH 33.2 (H) 09/22/2020   RDW 13.0 09/22/2020   PLT 172 95/18/8416   Last metabolic panel Lab Results  Component Value Date   GLUCOSE 90 09/22/2020   NA 140 09/22/2020   K 4.0 09/22/2020   CL 99 09/22/2020   CO2 27 09/22/2020   BUN 14 09/22/2020   CREATININE 0.58 09/22/2020   GFRNONAA 86 09/22/2020   GFRAA 99 09/22/2020   CALCIUM 9.3 09/22/2020   PROT 6.6 07/14/2020   ALBUMIN 4.3 07/14/2020   LABGLOB 2.3 07/14/2020   AGRATIO 1.9 07/14/2020   BILITOT 0.3 07/14/2020   ALKPHOS 67 07/14/2020   AST 23 07/14/2020   ALT 8 07/14/2020   ANIONGAP 9 12/15/2019      Objective    BP 135/78 (BP Location: Right Arm, Patient Position: Sitting, Cuff Size: Normal)   Pulse 83   Temp 98.2 F (36.8 C) (Oral)   Resp 16  BP Readings from Last 3 Encounters:  09/22/20 135/78  07/14/20 (!) 132/60  03/09/20 108/66   Wt Readings from Last 3 Encounters:  07/14/20 167 lb (75.8 kg)  03/09/20 163 lb (73.9 kg)  02/27/20 158 lb 11.7 oz (72 kg)       Physical Exam Vitals reviewed. Exam conducted with a chaperone present.  Constitutional:      General: She is not in acute distress.    Appearance: Normal appearance. She is well-developed. She is not ill-appearing or diaphoretic.  HENT:     Head: Normocephalic and atraumatic.  Cardiovascular:     Rate and Rhythm: Normal rate and regular rhythm.     Heart sounds: Normal heart sounds. No murmur heard.  No friction rub. No gallop.   Pulmonary:     Effort: Pulmonary effort is normal. No respiratory distress.     Breath sounds: Normal breath  sounds. No wheezing or rales.  Abdominal:     General: Abdomen is flat. Bowel sounds are normal. There is no distension.     Palpations: Abdomen is soft. There is no mass.     Tenderness: There is no abdominal tenderness. There is no right CVA tenderness, left CVA tenderness or guarding.  Genitourinary:    General: Normal vulva.     Exam position: Supine.     Pubic Area: No rash.      Labia:        Right: No rash, tenderness, lesion or injury.        Left: No rash, tenderness, lesion or injury.      Urethra: No prolapse or urethral swelling.     Vagina: Normal. No vaginal discharge, erythema, bleeding or lesions.     Cervix: No discharge, erythema or cervical bleeding.     Uterus: Normal.      Adnexa: Right adnexa normal and left adnexa normal.  Musculoskeletal:     Cervical back: Normal range of motion and neck supple.     Right lower leg: No edema.     Left lower leg: No edema.  Lymphadenopathy:     Lower Body: No right inguinal adenopathy. No left inguinal adenopathy.  Skin:    General: Skin is warm and dry.  Neurological:     Mental Status: She is alert.      Results for orders placed or performed in visit on 09/22/20  Urine Culture   Specimen: Urine   Urine  Result Value Ref Range   Urine Culture, Routine Final report    Organism ID, Bacteria Comment   CBC w/Diff/Platelet  Result Value Ref Range   WBC 7.1 3.4 - 10.8  x10E3/uL   RBC 4.13 3.77 - 5.28 x10E6/uL   Hemoglobin 13.7 11.1 - 15.9 g/dL   Hematocrit 40.6 34.0 - 46.6 %   MCV 98 (H) 79 - 97 fL   MCH 33.2 (H) 26.6 - 33.0 pg   MCHC 33.7 31 - 35 g/dL   RDW 13.0 11.7 - 15.4 %   Platelets 172 150 - 450 x10E3/uL   Neutrophils 52 Not Estab. %   Lymphs 29 Not Estab. %   Monocytes 14 Not Estab. %   Eos 4 Not Estab. %   Basos 1 Not Estab. %   Neutrophils Absolute 3.7 1 - 7 x10E3/uL   Lymphocytes Absolute 2.1 0 - 3 x10E3/uL   Monocytes Absolute 1.0 (H) 0 - 0 x10E3/uL   EOS (ABSOLUTE) 0.3 0.0 - 0.4 x10E3/uL   Basophils Absolute 0.0 0 - 0 x10E3/uL   Immature Granulocytes 0 Not Estab. %   Immature Grans (Abs) 0.0 0.0 - 0.1 V67M0/NO  Basic Metabolic Panel (BMET)  Result Value Ref Range   Glucose 90 65 - 99 mg/dL   BUN 14 8 - 27 mg/dL   Creatinine, Ser 0.58 0.57 - 1.00 mg/dL   GFR calc non Af Amer 86 >59 mL/min/1.73   GFR calc Af Amer 99 >59 mL/min/1.73   BUN/Creatinine Ratio 24 12 - 28   Sodium 140 134 - 144 mmol/L   Potassium 4.0 3.5 - 5.2 mmol/L   Chloride 99 96 - 106 mmol/L   CO2 27 20 - 29 mmol/L   Calcium 9.3 8.7 - 10.3 mg/dL  Specimen status report  Result Value Ref Range   specimen status report Comment   POCT urinalysis dipstick  Result Value Ref Range   Color, UA dark yellow  Clarity, UA clear    Glucose, UA Negative Negative   Bilirubin, UA negative    Ketones, UA negative    Spec Grav, UA >=1.030 (A) 1.010 - 1.025   Blood, UA negative    pH, UA 6.0 5.0 - 8.0   Protein, UA Negative Negative   Urobilinogen, UA 0.2 0.2 or 1.0 E.U./dL   Nitrite, UA negative    Leukocytes, UA Negative Negative   Appearance normal.    Odor foul     Assessment & Plan     1. Vaginal discharge UA unremarkable. Vaginal exam was completely normal. Since patient is having mild symptoms and has h/o UTI I will treat with Bactrim as below for possible infection. Will check labs as below and f/u pending results. Call if not improving or symptoms  continue as patient may require pelvic US. Family agree.  - POCT urinalysis dipstick - CBC w/Diff/Platelet - Basic Metabolic Panel (BMET)  2. Need for influenza vaccination Flu vaccine given today without complication. Patient sat upright for 15 minutes to check for adverse reaction before being released. - Flu Vaccine QUAD High Dose(Fluad)  3. Acute cystitis with hematuria See above medical treatment plan for #1.  - sulfamethoxazole-trimethoprim (BACTRIM) 400-80 MG tablet; Take 1 tablet by mouth 2 (two) times daily.  Dispense: 10 tablet; Refill: 0 - Urine Culture   No follow-ups on file.      Reynolds Bowl, PA-C, have reviewed all documentation for this visit. The documentation on 09/26/20 for the exam, diagnosis, procedures, and orders are all accurate and complete.   Rubye Beach  [facility] [phone] (phone) [phone] (fax)  [place]

## 2020-09-22 NOTE — Telephone Encounter (Signed)
Copied from [place] [id number]. Topic: General - Inquiry >> Sep 21, 2020  5:01 PM Greggory Keen D wrote: Reason for CRM: Pt's son called saying he talked to a nurse this afternoon about his mom having blood in her underpants.  She said the nurse at [facility] was going to try to call his mom's house and talk to the nurse there that is caring for her during the day.    He has a number to the RN on call if someone would want to reach out to her.  [phone]  Son's CB#  [phone]

## 2020-09-22 NOTE — Telephone Encounter (Signed)
Pt's son called to report that pt. Has had pink tinge discharge/ drainage in underwear.  Reported pt. Has dementia, and takes self to [facility], and was not aware of the discharge in underwear.  Son reported he observed a pink drainage and did not notice an odor; unsure of onset of pink discharge.  Reported pt. Has verbalized she "doesn't feel well" to his father for approx. one week.  Reported no specific signs of abdominal or pelvic pain, fever, or frequency of urination.  Appt. Scheduled today at 3:00 PM with PCP office.  Advised son to have pt. Increase oral fluids today, and to be prepared to have pt. Give urine sample at office appt.  Encouraged to call back if worsening of symptoms.  Son verb. Understanding.   Reason for Disposition  Abnormal color vaginal discharge (i.e., yellow, green, gray)  Answer Assessment - Initial Assessment Questions 1. DISCHARGE: "Describe the discharge." (e.g., white, yellow, green, gray, foamy, cottage cheese-like)     Pink tinge discharge in pt's underwear 2. ODOR: "Is there a bad odor?"     denied 3. ONSET: "When did the discharge begin?"     unsure 4. RASH: "Is there a rash in that area?" If Yes, ask: "Describe it." (e.g., redness, blisters, sores, bumps)     n/a 5. ABDOMINAL PAIN: "Are you having any abdominal pain?" If Yes, ask: "What does it feel like? " (e.g., crampy, dull, intermittent, constant)      No specific complaints  6. ABDOMINAL PAIN SEVERITY: If present, ask: "How bad is it?"  (e.g., mild, moderate, severe)  - MILD - doesn't interfere with normal activities   - MODERATE - interferes with normal activities or awakens from sleep   - SEVERE - patient doesn't want to move (R/O peritonitis)      No complaints of  7. CAUSE: "What do you think is causing the discharge?" "Have you had the same problem before? What happened then?"     Unknown; pt. Has dementia 8. OTHER SYMPTOMS: "Do you have any other symptoms?" (e.g., fever, itching, vaginal bleeding,  pain with urination, injury to genital area, vaginal foreign body)     Pink discharge per son; no specific complaints; stated she is not feeling good  9. PREGNANCY: "Is there any chance you are pregnant?" "When was your last menstrual period?"     n/a  Protocols used: VAGINAL DISCHARGE-A-AH

## 2020-09-22 NOTE — Telephone Encounter (Signed)
Looks like patient was seen by [place] today. Please review message.

## 2020-09-23 ENCOUNTER — Telehealth: Payer: Self-pay

## 2020-09-23 DIAGNOSIS — M5136 Other intervertebral disc degeneration, lumbar region: Secondary | ICD-10-CM | POA: Diagnosis not present

## 2020-09-23 DIAGNOSIS — Z79891 Long term (current) use of opiate analgesic: Secondary | ICD-10-CM | POA: Diagnosis not present

## 2020-09-23 DIAGNOSIS — M48062 Spinal stenosis, lumbar region with neurogenic claudication: Secondary | ICD-10-CM | POA: Diagnosis not present

## 2020-09-23 DIAGNOSIS — M5416 Radiculopathy, lumbar region: Secondary | ICD-10-CM | POA: Diagnosis not present

## 2020-09-23 LAB — BASIC METABOLIC PANEL
BUN/Creatinine Ratio: 24 (ref 12–28)
BUN: 14 mg/dL (ref 8–27)
CO2: 27 mmol/L (ref 20–29)
Calcium: 9.3 mg/dL (ref 8.7–10.3)
Chloride: 99 mmol/L (ref 96–106)
Creatinine, Ser: 0.58 mg/dL (ref 0.57–1.00)
GFR calc Af Amer: 99 mL/min/{1.73_m2} (ref >59)
GFR calc non Af Amer: 86 mL/min/{1.73_m2} (ref >59)
Glucose: 90 mg/dL (ref 65–99)
Potassium: 4 mmol/L (ref 3.5–5.2)
Sodium: 140 mmol/L (ref 134–144)

## 2020-09-23 LAB — CBC WITH DIFFERENTIAL/PLATELET
Basophils Absolute: 0 10*3/uL (ref 0.0–0.2)
Basos: 1 %
EOS (ABSOLUTE): 0.3 10*3/uL (ref 0.0–0.4)
Eos: 4 %
Hematocrit: 40.6 % (ref 34.0–46.6)
Hemoglobin: 13.7 g/dL (ref 11.1–15.9)
Immature Grans (Abs): 0 10*3/uL (ref 0.0–0.1)
Immature Granulocytes: 0 %
Lymphocytes Absolute: 2.1 10*3/uL (ref 0.7–3.1)
Lymphs: 29 %
MCH: 33.2 pg — ABNORMAL HIGH (ref 26.6–33.0)
MCHC: 33.7 g/dL (ref 31.5–35.7)
MCV: 98 fL — ABNORMAL HIGH (ref 79–97)
Monocytes Absolute: 1 10*3/uL — ABNORMAL HIGH (ref 0.1–0.9)
Monocytes: 14 %
Neutrophils Absolute: 3.7 10*3/uL (ref 1.4–7.0)
Neutrophils: 52 %
Platelets: 172 10*3/uL (ref 150–450)
RBC: 4.13 x10E6/uL (ref 3.77–5.28)
RDW: 13 % (ref 11.7–15.4)
WBC: 7.1 10*3/uL (ref 3.4–10.8)

## 2020-09-23 NOTE — Telephone Encounter (Signed)
AJ advised.   Thanks,   -Mickel Baas

## 2020-09-23 NOTE — Telephone Encounter (Signed)
FYI

## 2020-09-23 NOTE — Telephone Encounter (Signed)
Patient has appointment scheduled with Grace Bushy.

## 2020-09-23 NOTE — Telephone Encounter (Signed)
-----   Message from Mar Daring, PA-C sent at 09/23/2020  9:38 AM EDT ----- All labs look great. Nothing concerning noted. Hemoglobin is normal. No elevation of white cell count. Kidney function is normal.Electrolytes are normal.   Call son MONIQUA ENGEBRETSEN [phone]

## 2020-09-24 LAB — SPECIMEN STATUS REPORT

## 2020-09-24 LAB — URINE CULTURE

## 2020-09-26 ENCOUNTER — Encounter: Payer: Self-pay | Admitting: Physician Assistant

## 2020-10-01 ENCOUNTER — Telehealth: Payer: Self-pay

## 2020-10-01 DIAGNOSIS — N939 Abnormal uterine and vaginal bleeding, unspecified: Secondary | ICD-10-CM

## 2020-10-01 NOTE — Telephone Encounter (Signed)
error 

## 2020-10-01 NOTE — Telephone Encounter (Signed)
Pelvic US ordered for patient to r/o source of bleeding.

## 2020-10-01 NOTE — Addendum Note (Signed)
Addended by: Mar Daring on: 10/01/2020 11:23 AM   Modules accepted: Orders

## 2020-10-01 NOTE — Telephone Encounter (Signed)
-----   Message from Mar Daring, [place] sent at 09/24/2020 12:20 PM EDT ----- Urine culture is negative. No bacteria indicating UTI. Call son [name] [phone]

## 2020-10-06 ENCOUNTER — Other Ambulatory Visit: Payer: Self-pay | Admitting: Physician Assistant

## 2020-10-06 ENCOUNTER — Other Ambulatory Visit: Payer: Self-pay

## 2020-10-06 ENCOUNTER — Ambulatory Visit
Admission: RE | Admit: 2020-10-06 | Discharge: 2020-10-06 | Disposition: A | Discharge status: Home / Self Care | Payer: Medicare HMO | Source: Ambulatory Visit | Attending: Physician Assistant | Admitting: Physician Assistant

## 2020-10-06 DIAGNOSIS — D259 Leiomyoma of uterus, unspecified: Secondary | ICD-10-CM | POA: Diagnosis not present

## 2020-10-06 DIAGNOSIS — N939 Abnormal uterine and vaginal bleeding, unspecified: Secondary | ICD-10-CM | POA: Diagnosis not present

## 2020-10-06 DIAGNOSIS — N854 Malposition of uterus: Secondary | ICD-10-CM | POA: Diagnosis not present

## 2020-10-11 ENCOUNTER — Telehealth: Payer: Self-pay

## 2020-10-11 NOTE — Telephone Encounter (Signed)
Please advise results? 

## 2020-10-11 NOTE — Telephone Encounter (Signed)
Copied from [place] [id number]. Topic: General - Other >> Oct 11, 2020  3:36 PM Yvette Rack wrote: Reason for CRM: Pt husband called for an update on pt ultrasound results.

## 2020-10-12 ENCOUNTER — Telehealth: Payer: Self-pay

## 2020-10-12 DIAGNOSIS — M5136 Other intervertebral disc degeneration, lumbar region: Secondary | ICD-10-CM | POA: Diagnosis not present

## 2020-10-12 DIAGNOSIS — M5416 Radiculopathy, lumbar region: Secondary | ICD-10-CM | POA: Diagnosis not present

## 2020-10-12 DIAGNOSIS — M48062 Spinal stenosis, lumbar region with neurogenic claudication: Secondary | ICD-10-CM | POA: Diagnosis not present

## 2020-10-12 NOTE — Telephone Encounter (Signed)
Morgan Dennis  was advised of results. Morgan Dennis  also wanted to let Dr. Rosanna Randy know patient is having swelling in her legs and feet.

## 2020-10-12 NOTE — Telephone Encounter (Signed)
It is essentially normal.  How About seeing me next week for follow-up.

## 2020-10-12 NOTE — Telephone Encounter (Signed)
See previous message. Already spoke to husband and son.

## 2020-10-12 NOTE — Telephone Encounter (Signed)
Copied from [place] [id number]. Topic: Quick Communication - See Telephone Encounter >> Oct 12, 2020 11:12 AM Loma [place] wrote: CRM for notification. See Telephone encounter for: 10/12/20. Pt husband has called back and they still have not heard any result of the vaginal imaging done 1st of last week after a couple calls to inquire.  In addition pt is have extreme swelling in legs and feet. Husband wants a FU done re these issues to their son Quita Skye. At [phone]. States son has a better understanding. Pls FU with son at prior listed #

## 2020-10-20 ENCOUNTER — Encounter: Payer: Self-pay | Admitting: Family Medicine

## 2020-10-20 ENCOUNTER — Ambulatory Visit: Payer: Self-pay | Admitting: Family Medicine

## 2020-10-20 ENCOUNTER — Other Ambulatory Visit: Payer: Self-pay

## 2020-10-20 ENCOUNTER — Ambulatory Visit (INDEPENDENT_AMBULATORY_CARE_PROVIDER_SITE_OTHER): Payer: Medicare HMO | Admitting: Family Medicine

## 2020-10-20 VITALS — Temp 98.8°F | Wt 168.0 lb

## 2020-10-20 DIAGNOSIS — R69 Illness, unspecified: Secondary | ICD-10-CM | POA: Diagnosis not present

## 2020-10-20 DIAGNOSIS — N3001 Acute cystitis with hematuria: Secondary | ICD-10-CM

## 2020-10-20 DIAGNOSIS — G20 Parkinson's disease: Secondary | ICD-10-CM | POA: Diagnosis not present

## 2020-10-20 DIAGNOSIS — N952 Postmenopausal atrophic vaginitis: Secondary | ICD-10-CM

## 2020-10-20 DIAGNOSIS — R5381 Other malaise: Secondary | ICD-10-CM | POA: Diagnosis not present

## 2020-10-20 DIAGNOSIS — N939 Abnormal uterine and vaginal bleeding, unspecified: Secondary | ICD-10-CM

## 2020-10-20 DIAGNOSIS — F0390 Unspecified dementia without behavioral disturbance: Secondary | ICD-10-CM | POA: Diagnosis not present

## 2020-10-20 DIAGNOSIS — R5383 Other fatigue: Secondary | ICD-10-CM

## 2020-10-20 NOTE — Progress Notes (Signed)
Established patient visit   Patient: Morgan Dennis   DOB: 1934-08-30   86 y.o. Female  MRN: [id number] Visit Date: 10/20/2020  Today's healthcare provider: [name], MD   No chief complaint on file.  Subjective    HPI  Patient is brought in for follow-up by her husband and son.  She is in failing health and has chronic fatigue and decreased responsiveness overall.  She is sleeping a good bit.  She had some spotting on her pads it was thought to be vaginal bleeding but there is no actual blood noted.  Just some discharge.  She had a normal pelvic exam and a normal ultrasound. dementia without behavioral disturbance, unspecified dementia type ([place]) From 07/14/2020-Patient still lives at home with her husband. MSE 21/30 today.  Parkinson's disease ([place]) From 07/14/2020-Per neurology Fall risk is an issue.  Vaginal Bleeding:  No actual bleeding documented.      Medications: Outpatient Medications Prior to Visit  Medication Sig  . abatacept (ORENCIA) 250 MG injection Inject 125 mg into the vein every 30 (thirty) days. Due for next dose 03/02/20 (Tuesday)  . acetaminophen (TYLENOL) 325 MG tablet Take 2 tablets (650 mg total) by mouth every 6 (six) hours as needed for mild pain (or Fever >/= 101).  Marland Kitchen amLODipine (NORVASC) 5 MG tablet Take 1 tablet (5 mg total) by mouth daily.  Marland Kitchen ascorbic acid (VITAMIN C) 1000 MG tablet Take 1,000 mg by mouth daily.   Marland Kitchen ascorbic acid (VITAMIN C) 500 MG tablet Take 1 tablet (500 mg total) by mouth daily.  Marland Kitchen CALCIUM PO Take 2,000 mg by mouth daily.  . carbidopa-levodopa (SINEMET CR) 50-200 MG tablet Take 1 tablet by mouth at bedtime.  . carbidopa-levodopa (SINEMET IR) 25-100 MG tablet Take 1.5 tablets by mouth 3 (three) times daily.   . Cholecalciferol (VITAMIN D3) 2000 UNITS TABS Take 2,000 Units by mouth daily.   Marland Kitchen denosumab (PROLIA) 60 MG/ML SOSY injection Inject 60 mg into the skin every 6 (six) months.  . docusate sodium (COLACE) 100 MG  capsule Take 100 mg by mouth daily as needed.   . donepezil (ARICEPT) 10 MG tablet Take 10 mg by mouth daily.   . DULoxetine (CYMBALTA) 60 MG capsule Take 60 mg by mouth daily.  . folic acid (FOLVITE) 1 MG tablet Take 1 mg by mouth daily.  Marland Kitchen gabapentin (NEURONTIN) 600 MG tablet Take 1 tablet (600 mg total) by mouth 2 (two) times daily.  Marland Kitchen HYDROcodone-acetaminophen (NORCO) 7.5-325 MG tablet Take 0.5-1 tablets by mouth 3 (three) times daily as needed for moderate pain.   . Melatonin 5 MG TABS Take 5 mg by mouth at bedtime.   . memantine (NAMENDA) 10 MG tablet Take 10 mg by mouth 2 (two) times daily.  . methotrexate 2.5 MG tablet Take 15 mg by mouth every Wednesday.   . Multiple Vitamin (MULTIVITAMIN WITH MINERALS) TABS tablet Take 1 tablet by mouth daily.  . polyethylene glycol (MIRALAX / GLYCOLAX) 17 g packet Take 17 g by mouth daily as needed.  . solifenacin (VESICARE) 5 MG tablet Take 1 tablet (5 mg total) by mouth daily.  Marland Kitchen sulfamethoxazole-trimethoprim (BACTRIM) 400-80 MG tablet Take 1 tablet by mouth 2 (two) times daily.  . vitamin E 400 UNIT capsule Take 400 Units by mouth daily.  Marland Kitchen zinc sulfate 220 (50 Zn) MG capsule Take 1 capsule (220 mg total) by mouth daily.   No facility-administered medications prior to visit.  Review of Systems  Constitutional: Negative for appetite change, chills, fatigue and fever.  Respiratory: Negative for chest tightness and shortness of breath.   Cardiovascular: Negative for chest pain and palpitations.  Gastrointestinal: Negative for abdominal pain, nausea and vomiting.  Genitourinary: Positive for vaginal bleeding. Negative for decreased urine volume, difficulty urinating, dysuria, flank pain, frequency, hematuria, urgency, vaginal discharge and vaginal pain.  Neurological: Negative for dizziness and weakness.       Objective    There were no vitals taken for this visit. BP Readings from Last 3 Encounters:  09/22/20 135/78  07/14/20 (!) 132/60   03/09/20 108/66   Wt Readings from Last 3 Encounters:  10/20/20 168 lb (76.2 kg)  07/14/20 167 lb (75.8 kg)  03/09/20 163 lb (73.9 kg)      Physical Exam Vitals reviewed.  Constitutional:      Appearance: Normal appearance. She is normal weight.  HENT:     Head: Normocephalic and atraumatic.     Right Ear: Tympanic membrane, ear canal and external ear normal.     Left Ear: Tympanic membrane, ear canal and external ear normal.     Nose: Nose normal.     Mouth/Throat:     Mouth: Mucous membranes are moist.     Pharynx: Oropharynx is clear.  Eyes:     Extraocular Movements: Extraocular movements intact.     Conjunctiva/sclera: Conjunctivae normal.     Pupils: Pupils are equal, round, and reactive to light.  Cardiovascular:     Rate and Rhythm: Normal rate and regular rhythm.     Pulses: Normal pulses.     Heart sounds: Normal heart sounds.  Pulmonary:     Effort: Pulmonary effort is normal.     Breath sounds: Normal breath sounds.  Abdominal:     Palpations: Abdomen is soft.  Musculoskeletal:     Cervical back: Normal range of motion and neck supple.  Skin:    General: Skin is warm and dry.  Neurological:     Mental Status: She is alert and oriented to person, place, and time. Mental status is at baseline.  Psychiatric:        Behavior: Behavior normal.        Thought Content: Thought content normal.       No results found for any visits on 10/20/20.  Assessment & Plan      1. Vaginal bleeding I do not actually think this is a vaginal bleeding.  It could be spotting from atrophic vaginitis but I discussed with husband and the son work-up of this.  Offered gynecologic referral but I think we can follow this clinically..  - POCT urinalysis dipstick - CULTURE, URINE COMPREHENSIVE - CBC - Comp. Metabolic Panel (12)  2. Dementia without behavioral disturbance, unspecified dementia type ([place]) MMSE is 18/30.  No behavioral disturbance.  She is worsening.  I think  this decline is dictating her overall health issues along with Parkinson's disease. - CBC - Comp. Metabolic Panel (12)  3. Atrophic vaginitis Consider referral to gynecology although I think she would get limited benefit from this.  4. Malaise and fatigue Multifactorial  5. Acute cystitis with hematuria Check urine to see if this could be the source of the discharge.  6. Parkinson's disease ([place])  7.  Rheumatoid arthritis   No follow-ups on file.      I, [name], MD, have reviewed all documentation for this visit. The documentation on 10/25/20 for the exam, diagnosis, procedures, and  orders are all accurate and complete.    Flossie Wexler Cranford Mon, MD  [facility] [phone] (phone) [phone] (fax)  [place]

## 2020-10-21 LAB — COMP. METABOLIC PANEL (12)
AST: 21 IU/L (ref 0–40)
Albumin/Globulin Ratio: 1.6 (ref 1.2–2.2)
Albumin: 4.3 g/dL (ref 3.6–4.6)
Alkaline Phosphatase: 66 IU/L (ref 44–121)
BUN/Creatinine Ratio: 21 (ref 12–28)
BUN: 14 mg/dL (ref 8–27)
Bilirubin Total: 0.3 mg/dL (ref 0.0–1.2)
Calcium: 9.6 mg/dL (ref 8.7–10.3)
Chloride: 99 mmol/L (ref 96–106)
Creatinine, Ser: 0.68 mg/dL (ref 0.57–1.00)
GFR calc Af Amer: 94 mL/min/{1.73_m2} (ref >59)
GFR calc non Af Amer: 81 mL/min/{1.73_m2} (ref >59)
Globulin, Total: 2.7 g/dL (ref 1.5–4.5)
Glucose: 85 mg/dL (ref 65–99)
Potassium: 4 mmol/L (ref 3.5–5.2)
Sodium: 139 mmol/L (ref 134–144)
Total Protein: 7 g/dL (ref 6.0–8.5)

## 2020-10-21 LAB — CBC
Hematocrit: 41.2 % (ref 34.0–46.6)
Hemoglobin: 13.5 g/dL (ref 11.1–15.9)
MCH: 32.8 pg (ref 26.6–33.0)
MCHC: 32.8 g/dL (ref 31.5–35.7)
MCV: 100 fL — ABNORMAL HIGH (ref 79–97)
Platelets: 213 10*3/uL (ref 150–450)
RBC: 4.11 x10E6/uL (ref 3.77–5.28)
RDW: 13.3 % (ref 11.7–15.4)
WBC: 8.8 10*3/uL (ref 3.4–10.8)

## 2020-10-24 LAB — CULTURE, URINE COMPREHENSIVE

## 2020-10-25 LAB — POCT URINALYSIS DIPSTICK
Bilirubin, UA: NEGATIVE
Blood, UA: NEGATIVE
Glucose, UA: NEGATIVE
Ketones, UA: POSITIVE
Leukocytes, UA: NEGATIVE
Nitrite, UA: NEGATIVE
Protein, UA: POSITIVE — AB
Spec Grav, UA: >=1.03 — AB (ref 1.010–1.025)
Urobilinogen, UA: 0.2 E.U./dL
pH, UA: 5 (ref 5.0–8.0)

## 2020-10-28 ENCOUNTER — Other Ambulatory Visit: Payer: Self-pay

## 2020-10-28 ENCOUNTER — Ambulatory Visit: Payer: Self-pay | Admitting: *Deleted

## 2020-10-28 ENCOUNTER — Emergency Department
Admission: EM | Admit: 2020-10-28 | Discharge: 2020-10-28 | Disposition: A | Discharge status: Home / Self Care | Payer: Medicare HMO | Attending: Emergency Medicine | Admitting: Emergency Medicine

## 2020-10-28 DIAGNOSIS — N939 Abnormal uterine and vaginal bleeding, unspecified: Secondary | ICD-10-CM | POA: Diagnosis not present

## 2020-10-28 DIAGNOSIS — Z96659 Presence of unspecified artificial knee joint: Secondary | ICD-10-CM | POA: Diagnosis not present

## 2020-10-28 DIAGNOSIS — G20 Parkinson's disease: Secondary | ICD-10-CM | POA: Insufficient documentation

## 2020-10-28 DIAGNOSIS — F0390 Unspecified dementia without behavioral disturbance: Secondary | ICD-10-CM | POA: Insufficient documentation

## 2020-10-28 DIAGNOSIS — Z8616 Personal history of COVID-19: Secondary | ICD-10-CM | POA: Diagnosis not present

## 2020-10-28 DIAGNOSIS — R0902 Hypoxemia: Secondary | ICD-10-CM | POA: Diagnosis not present

## 2020-10-28 DIAGNOSIS — R69 Illness, unspecified: Secondary | ICD-10-CM | POA: Diagnosis not present

## 2020-10-28 DIAGNOSIS — R404 Transient alteration of awareness: Secondary | ICD-10-CM | POA: Diagnosis not present

## 2020-10-28 DIAGNOSIS — N898 Other specified noninflammatory disorders of vagina: Secondary | ICD-10-CM | POA: Diagnosis not present

## 2020-10-28 DIAGNOSIS — R58 Hemorrhage, not elsewhere classified: Secondary | ICD-10-CM | POA: Diagnosis not present

## 2020-10-28 DIAGNOSIS — Z79899 Other long term (current) drug therapy: Secondary | ICD-10-CM | POA: Diagnosis not present

## 2020-10-28 LAB — COMPREHENSIVE METABOLIC PANEL
ALT: 5 U/L (ref 0–44)
AST: 32 U/L (ref 15–41)
Albumin: 3.9 g/dL (ref 3.5–5.0)
Alkaline Phosphatase: 55 U/L (ref 38–126)
Anion gap: 12 (ref 5–15)
BUN: 16 mg/dL (ref 8–23)
CO2: 27 mmol/L (ref 22–32)
Calcium: 8.9 mg/dL (ref 8.9–10.3)
Chloride: 97 mmol/L — ABNORMAL LOW (ref 98–111)
Creatinine, Ser: 0.67 mg/dL (ref 0.44–1.00)
GFR, Estimated: >60 mL/min (ref >60)
Glucose, Bld: 108 mg/dL — ABNORMAL HIGH (ref 70–99)
Potassium: 3.7 mmol/L (ref 3.5–5.1)
Sodium: 136 mmol/L (ref 135–145)
Total Bilirubin: 0.9 mg/dL (ref 0.3–1.2)
Total Protein: 6.8 g/dL (ref 6.5–8.1)

## 2020-10-28 LAB — WET PREP, GENITAL
Clue Cells Wet Prep HPF POC: NONE SEEN
Sperm: NONE SEEN
Trich, Wet Prep: NONE SEEN
Yeast Wet Prep HPF POC: NONE SEEN

## 2020-10-28 LAB — URINALYSIS, COMPLETE (UACMP) WITH MICROSCOPIC
Bilirubin Urine: NEGATIVE
Glucose, UA: NEGATIVE mg/dL
Hgb urine dipstick: NEGATIVE
Ketones, ur: 5 mg/dL — AB
Nitrite: NEGATIVE
Protein, ur: NEGATIVE mg/dL
Specific Gravity, Urine: 1.027 (ref 1.005–1.030)
pH: 5 (ref 5.0–8.0)

## 2020-10-28 LAB — CBC WITH DIFFERENTIAL/PLATELET
Abs Immature Granulocytes: 0.02 10*3/uL (ref 0.00–0.07)
Basophils Absolute: 0 10*3/uL (ref 0.0–0.1)
Basophils Relative: 0 %
Eosinophils Absolute: 0.2 10*3/uL (ref 0.0–0.5)
Eosinophils Relative: 4 %
HCT: 39 % (ref 36.0–46.0)
Hemoglobin: 13 g/dL (ref 12.0–15.0)
Immature Granulocytes: 0 %
Lymphocytes Relative: 28 %
Lymphs Abs: 1.9 10*3/uL (ref 0.7–4.0)
MCH: 33.9 pg (ref 26.0–34.0)
MCHC: 33.3 g/dL (ref 30.0–36.0)
MCV: 101.6 fL — ABNORMAL HIGH (ref 80.0–100.0)
Monocytes Absolute: 0.8 10*3/uL (ref 0.1–1.0)
Monocytes Relative: 12 %
Neutro Abs: 3.8 10*3/uL (ref 1.7–7.7)
Neutrophils Relative %: 56 %
Platelets: 170 10*3/uL (ref 150–400)
RBC: 3.84 MIL/uL — ABNORMAL LOW (ref 3.87–5.11)
RDW: 13.7 % (ref 11.5–15.5)
WBC: 6.7 10*3/uL (ref 4.0–10.5)
nRBC: 0 % (ref 0.0–0.2)

## 2020-10-28 NOTE — Discharge Instructions (Addendum)
Please call the number provided for gynecology to arrange a follow-up appointment. Return to the emergency department for any fever, pain, or any other symptom personally concerning to yourself.
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
## 2020-10-28 NOTE — ED Triage Notes (Signed)
Pt from home, pt with moderate memory loss per husband. Pt oriented to self and situation, disoriented to place. Pt with c/o vaginal bleeding/brown stained underwear. Husband denies bright red blood or clots noticed.  Pt seen by regular MD for same issue, but no conclusive results. Husband wanted wife to come to ER due to issue not being resolved.

## 2020-10-28 NOTE — ED Notes (Signed)
Pt husband showed picture of pad and it appears to be vaginal discharge.

## 2020-10-28 NOTE — Telephone Encounter (Addendum)
aren RN from [facility] called because she was contacted by the pt's son because she is having suspected vaginal bleeding; Santiago Glad states they have an aid who is currently with the pt 10/5 bloody underclothes and PJs were noted by the aide; she was seen in [place] on11/3/21; the pt is bleeding more today and she complains of being tired; it is noted the pt is a poor historian, and there was no pad on the bed making it difficult to quantify the amount of bleeding; they are not sure of the source and the nurse is not on site to visualize the pt's anantomy;  today more bleeding than before; pt is complaining of being tired; the aid does not know the source of the bleeding (anal, vaginal, urine); the amount of bleeding has increased but they can not quantify the amount or the source; the pt was seen on 10/20/20; spoke with [facility] and she states a GYN referral will be placed and the pt should be evaluated in the ED ; the pt is seen by Dr Rosanna Randy, [facility]; Santiago Glad states she will notify the pt's son; will route to office for notification

## 2020-10-28 NOTE — ED Triage Notes (Signed)
Pt placed on O2 via EMS due to low O2 sat on room air. Pt 94% on 3 L [place]

## 2020-10-28 NOTE — Telephone Encounter (Signed)
  Reason for Disposition . Patient sounds very sick or weak to the triager  Answer Assessment - Initial Assessment Questions 1. AMOUNT: "Describe the bleeding that you are having." "How much bleeding is there?"    - SPOTTING: spotting, or pinkish / brownish mucous discharge; does not fill panti-liner or pad    - MILD:  less than 1 pad / hour; less than patient's usual menstrual bleeding   - MODERATE: 1-2 pads / hour; 1 menstrual cup every 6 hours; small-medium blood clots (e.g., pea, grape, small coin)   - SEVERE: soaking 2 or more pads/hour for 2 or more hours; 1 menstrual cup every 2 hours; bleeding not contained by pads or continuous red blood from vagina; large blood clots (e.g., golf ball, large coin)      Unable to quantify 2. ONSET: "When did the bleeding begin?" "Is it continuing now?"     Seen in office 10/20/20 3. MENOPAUSE: "When was your last menstrual period?"      Post menopausa; 4. ABDOMINAL PAIN: "Do you have any pain?" "How bad is the pain?"  (e.g., Scale 1-10; mild, moderate, or severe)   - MILD (1-3): doesn't interfere with normal activities, abdomen soft and not tender to touch    - MODERATE (4-7): interferes with normal activities or awakens from sleep, tender to touch    - SEVERE (8-10): excruciating pain, doubled over, unable to do any normal activities      no 5. BLOOD THINNERS: "Do you take any blood thinners?" (e.g., Coumadin/warfarin, Pradaxa/dabigatran, aspirin)     no 6. HORMONES: "Are you taking any hormone medications, prescription or OTC?" (e.g., birth control pills, estrogen)     no 7. CAUSE: "What do you think is causing the bleeding?" (e.g., recent gyn surgery, recent gyn procedure; known bleeding disorder, uterine cancer)     Not sure; seen in office 10/20/20 8. HEMODYNAMIC STATUS: "Are you weak or feeling lightheaded?" If Yes, ask: "Can you stand and walk normally?"       "feels tired" 9. OTHER SYMPTOMS: "What other symptoms are you having with the  bleeding?" (e.g., back pain, burning with urination, fever)  Protocols used: VAGINAL BLEEDING - POSTMENOPAUSAL-A-AH

## 2020-10-28 NOTE — Telephone Encounter (Signed)
Copied from [place] [phone]. Topic: General - Other >> Oct 28, 2020  1:38 PM Oneta Rack wrote: Reason for CRM:   AJ received a call from patient nurse Santiago Glad and was advised vaginal bleeding has increased and would like referral to GYN expedited.    Contacted nurse to gain insight regarding patient symptoms. Santiago Glad ([place] Nurse) phone # transferred to [facility] NT

## 2020-11-03 ENCOUNTER — Other Ambulatory Visit: Payer: Self-pay

## 2020-11-03 ENCOUNTER — Encounter: Payer: Self-pay | Admitting: Obstetrics and Gynecology

## 2020-11-03 ENCOUNTER — Ambulatory Visit (INDEPENDENT_AMBULATORY_CARE_PROVIDER_SITE_OTHER): Payer: Medicare HMO | Admitting: Obstetrics and Gynecology

## 2020-11-03 VITALS — BP 126/74 | Wt 162.0 lb

## 2020-11-03 DIAGNOSIS — N924 Excessive bleeding in the premenopausal period: Secondary | ICD-10-CM
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
## 2020-11-24 ENCOUNTER — Ambulatory Visit (INDEPENDENT_AMBULATORY_CARE_PROVIDER_SITE_OTHER): Payer: Medicare HMO | Admitting: Family Medicine

## 2020-11-24 ENCOUNTER — Encounter: Payer: Self-pay | Admitting: Family Medicine

## 2020-11-24 ENCOUNTER — Other Ambulatory Visit: Payer: Self-pay

## 2020-11-24 VITALS — BP 131/78 | HR 78 | Temp 98.6°F | Resp 16 | Wt 172.0 lb

## 2020-11-24 DIAGNOSIS — F0390 Unspecified dementia without behavioral disturbance: Secondary | ICD-10-CM | POA: Diagnosis not present

## 2020-11-24 DIAGNOSIS — N939 Abnormal uterine and vaginal bleeding, unspecified: Secondary | ICD-10-CM | POA: Diagnosis not present

## 2020-11-24 DIAGNOSIS — M069 Rheumatoid arthritis, unspecified: Secondary | ICD-10-CM | POA: Diagnosis not present

## 2020-11-24 DIAGNOSIS — R69 Illness, unspecified: Secondary | ICD-10-CM | POA: Diagnosis not present

## 2020-11-24 DIAGNOSIS — G20 Parkinson's disease: Secondary | ICD-10-CM | POA: Diagnosis not present

## 2020-11-24 NOTE — Progress Notes (Signed)
Established patient visit   Patient: Morgan Dennis   DOB: [birth year]   86 y.o. Female  MRN: [id number] Visit Date: 11/24/2020  Today's healthcare provider: [name], MD   Chief Complaint  Patient presents with   Follow-up   Subjective    HPI  Has not had any active vaginal bleeding.  She states she is feeling well.  And has no active complaints. Vaginal bleeding From 10/20/2020-I do not actually think this is a vaginal bleeding.  It could be spotting from atrophic vaginitis but I discussed with husband and the son work-up of this. Referred to GYN.  Patient was seen on 11/03/2020 at GYN Dr. Prentice Docker.  Dementia without behavioral disturbance, unspecified dementia type ([place]) From 10/20/2020-MMSE is 18/30.  No behavioral disturbance.  She is worsening.  I think this decline is dictating her overall health issues along with Parkinson's disease. Patient's son reports Mrs. Cancelliere's memory is worsening.  Atrophic vaginitis From 10/20/2020-Consider referral to gynecology although I think she would get limited benefit from this.  Patient Active Problem List   Diagnosis Date Noted   Acute respiratory failure due to COVID-19 ([place]) 12/10/2019   Acute respiratory failure with hypoxia ([facility]) 10/19/2019   Thrombocytopenia ([place]) 10/19/2019   Dementia without behavioral disturbance ([place]) 10/19/2019   Aortic atherosclerosis ([place]) 10/19/2019   Right upper lobe pneumonia 10/17/2019   Sepsis ([place]) 06/16/2019   Parkinson's disease ([place]) 04/23/2017   Skin cancer 02/12/2017   Chronic bronchitis ([place]) 01/02/2017   Frequency of urination and polyuria 10/12/2015   Upper GI bleed 08/18/2015   Adaptation reaction 04/30/2015   Malaise and fatigue 04/30/2015   Affective disorder, major 04/30/2015   Bad memory 04/30/2015   Mild cognitive disorder 04/30/2015   Fungal infection of toenail 04/30/2015   Arthritis, degenerative 04/30/2015   OP (osteoporosis)  04/30/2015   Arthritis or polyarthritis, rheumatoid ([place]) 04/30/2015   Avitaminosis D 05/12/2014   Trigger finger 05/12/2014   Rheumatoid arthritis ([place]) 03/17/2014   Social History   Tobacco Use   Smoking status: Never Smoker   Smokeless tobacco: Never Used  Vaping Use   Vaping Use: Never used  Substance Use Topics   Alcohol use: No   Drug use: No   Allergies  Allergen Reactions   Penicillins Swelling and Rash    Other reaction(s): Rash Has patient had a PCN reaction causing immediate rash, facial/tongue/throat swelling, SOB or lightheadedness with hypotension: Yes Has patient had a PCN reaction causing severe rash involving mucus membranes or skin necrosis: Unknown Has patient had a PCN reaction that required hospitalization:Yes Has patient had a PCN reaction occurring within the last 10 years: No If all of the above answers are "NO", then may proceed with Cephalosporin use.    Evista  [Raloxifene]     Other reaction(s): Joint Pains   Ibandronic Acid     Other reaction(s): Muscle Pain   Remicade [Infliximab] Other (See Comments)    Due to bleeding ulcer issue--MD advised her not to take   Risedronate Sodium     Other reaction(s): Joint Pains       Medications: Outpatient Medications Prior to Visit  Medication Sig   abatacept (ORENCIA) 250 MG injection Inject 125 mg into the vein every 30 (thirty) days. Due for next dose 03/02/20 (Tuesday)   acetaminophen (TYLENOL) 325 MG tablet Take 2 tablets (650 mg total) by mouth every 6 (six) hours as needed for mild pain (or Fever >/= 101).   amLODipine (  NORVASC) 5 MG tablet Take 1 tablet (5 mg total) by mouth daily.   ascorbic acid (VITAMIN C) 1000 MG tablet Take 1,000 mg by mouth daily.    ascorbic acid (VITAMIN C) 500 MG tablet Take 1 tablet (500 mg total) by mouth daily.   CALCIUM PO Take 2,000 mg by mouth daily.   carbidopa-levodopa (SINEMET CR) 50-200 MG tablet Take 1 tablet by mouth at bedtime.    carbidopa-levodopa (SINEMET IR) 25-100 MG tablet Take 1.5 tablets by mouth 3 (three) times daily.    Cholecalciferol (VITAMIN D3) 2000 UNITS TABS Take 2,000 Units by mouth daily.    denosumab (PROLIA) 60 MG/ML SOSY injection Inject 60 mg into the skin every 6 (six) months.   docusate sodium (COLACE) 100 MG capsule Take 100 mg by mouth daily as needed.    donepezil (ARICEPT) 10 MG tablet Take 10 mg by mouth daily.    DULoxetine (CYMBALTA) 60 MG capsule Take 60 mg by mouth daily.   folic acid (FOLVITE) 1 MG tablet Take 1 mg by mouth daily.   gabapentin (NEURONTIN) 600 MG tablet Take 1 tablet (600 mg total) by mouth 2 (two) times daily.   HYDROcodone-acetaminophen (NORCO) 7.5-325 MG tablet Take 0.5-1 tablets by mouth 3 (three) times daily as needed for moderate pain.    Melatonin 5 MG TABS Take 5 mg by mouth at bedtime.    memantine (NAMENDA) 10 MG tablet Take 10 mg by mouth 2 (two) times daily.   methotrexate 2.5 MG tablet Take 15 mg by mouth every Wednesday.    Multiple Vitamin (MULTIVITAMIN WITH MINERALS) TABS tablet Take 1 tablet by mouth daily.   polyethylene glycol (MIRALAX / GLYCOLAX) 17 g packet Take 17 g by mouth daily as needed.   solifenacin (VESICARE) 5 MG tablet Take 1 tablet (5 mg total) by mouth daily.   sulfamethoxazole-trimethoprim (BACTRIM) 400-80 MG tablet Take 1 tablet by mouth 2 (two) times daily.   vitamin E 400 UNIT capsule Take 400 Units by mouth daily.   zinc sulfate 220 (50 Zn) MG capsule Take 1 capsule (220 mg total) by mouth daily.   No facility-administered medications prior to visit.    Review of Systems  Constitutional: Positive for activity change. Negative for appetite change, chills, fatigue and fever.  Respiratory: Negative for chest tightness and shortness of breath.   Cardiovascular: Negative for chest pain and palpitations.  Gastrointestinal: Negative for abdominal pain, nausea and vomiting.  Musculoskeletal: Positive for myalgias.   Neurological: Negative for dizziness and weakness.  Psychiatric/Behavioral: Positive for confusion.    Last CBC Lab Results  Component Value Date   WBC 6.7 10/28/2020   HGB 13.0 10/28/2020   HCT 39.0 10/28/2020   MCV 101.6 (H) 10/28/2020   MCH 33.9 10/28/2020   RDW 13.7 10/28/2020   PLT 170 15/40/0867   Last metabolic panel Lab Results  Component Value Date   GLUCOSE 108 (H) 10/28/2020   NA 136 10/28/2020   K 3.7 10/28/2020   CL 97 (L) 10/28/2020   CO2 27 10/28/2020   BUN 16 10/28/2020   CREATININE 0.67 10/28/2020   GFRNONAA >60 10/28/2020   GFRAA 94 10/20/2020   CALCIUM 8.9 10/28/2020   PROT 6.8 10/28/2020   ALBUMIN 3.9 10/28/2020   LABGLOB 2.7 10/20/2020   AGRATIO 1.6 10/20/2020   BILITOT 0.9 10/28/2020   ALKPHOS 55 10/28/2020   AST 32 10/28/2020   ALT 5 10/28/2020   ANIONGAP 12 10/28/2020   Last thyroid functions Lab Results  Component Value Date   TSH 2.060 07/14/2020   Last vitamin D No results found for: Davy Pique, VD25OH Last vitamin B12 and Folate Lab Results  Component Value Date   VITAMINB12 646 06/11/2015      Objective    BP 131/78 (BP Location: Left Arm, Patient Position: Sitting, Cuff Size: Large)    Pulse 78    Temp 98.6 F (37 C) (Oral)    Resp 16    Wt 172 lb (78 kg)    BMI 29.52 kg/m  BP Readings from Last 3 Encounters:  11/24/20 131/78  11/03/20 126/74  10/28/20 (!) 166/86   Wt Readings from Last 3 Encounters:  11/24/20 172 lb (78 kg)  11/03/20 162 lb (73.5 kg)  10/28/20 155 lb (70.3 kg)      Physical Exam Vitals reviewed.  Constitutional:      Appearance: Normal appearance. She is normal weight.  HENT:     Head: Normocephalic and atraumatic.     Right Ear: Tympanic membrane, ear canal and external ear normal.     Left Ear: Tympanic membrane, ear canal and external ear normal.     Nose: Nose normal.     Mouth/Throat:     Mouth: Mucous membranes are moist.     Pharynx: Oropharynx is clear.  Eyes:      Extraocular Movements: Extraocular movements intact.     Conjunctiva/sclera: Conjunctivae normal.     Pupils: Pupils are equal, round, and reactive to light.  Cardiovascular:     Rate and Rhythm: Normal rate and regular rhythm.     Pulses: Normal pulses.     Heart sounds: Normal heart sounds.  Pulmonary:     Effort: Pulmonary effort is normal.     Breath sounds: Normal breath sounds.  Abdominal:     Palpations: Abdomen is soft.  Musculoskeletal:     Cervical back: Normal range of motion and neck supple.  Skin:    General: Skin is warm and dry.  Neurological:     Mental Status: She is alert and oriented to person, place, and time. Mental status is at baseline.  Psychiatric:        Mood and Affect: Mood normal.        Behavior: Behavior normal.        Thought Content: Thought content normal.       No results found for any visits on 11/24/20.  Assessment & Plan     1. Dementia without behavioral disturbance, unspecified dementia type ([place]) Aggressive problem.  Consider palliative care versus hospice referral.  2. Parkinson's disease ([place])   3. Rheumatoid arthritis involving multiple sites, unspecified whether rheumatoid factor present [facility]) Per rheumatology  4. Vaginal bleeding Family is agreeable to following this.  There is no active bleeding.  It is brown spotting on pads.  Would not actively pursue this unless it worsens.  Patient is in no acute distress.   No follow-ups on file.         Brenlynn Fake Cranford Mon, MD  [facility] [phone] (phone) [phone] (fax)  [place]

## 2020-11-24 NOTE — Patient Instructions (Signed)
Please stop taking Multiple Vitamin, Vitamin E, and Zinc

## 2020-12-02 ENCOUNTER — Ambulatory Visit: Payer: Medicare HMO | Admitting: Obstetrics and Gynecology

## 2020-12-15 DIAGNOSIS — M5136 Other intervertebral disc degeneration, lumbar region: Secondary | ICD-10-CM | POA: Diagnosis not present

## 2020-12-15 DIAGNOSIS — M48062 Spinal stenosis, lumbar region with neurogenic claudication: Secondary | ICD-10-CM | POA: Diagnosis not present

## 2020-12-15 DIAGNOSIS — M5416 Radiculopathy, lumbar region: Secondary | ICD-10-CM | POA: Diagnosis not present

## 2020-12-27 DIAGNOSIS — G2581 Restless legs syndrome: Secondary | ICD-10-CM | POA: Diagnosis not present

## 2020-12-27 DIAGNOSIS — R69 Illness, unspecified: Secondary | ICD-10-CM | POA: Diagnosis not present

## 2020-12-27 DIAGNOSIS — G608 Other hereditary and idiopathic neuropathies: Secondary | ICD-10-CM | POA: Diagnosis not present

## 2020-12-27 DIAGNOSIS — M0579 Rheumatoid arthritis with rheumatoid factor of multiple sites without organ or systems involvement: Secondary | ICD-10-CM | POA: Diagnosis not present

## 2020-12-27 DIAGNOSIS — G20 Parkinson's disease: Secondary | ICD-10-CM | POA: Diagnosis not present

## 2021-02-23 ENCOUNTER — Ambulatory Visit (INDEPENDENT_AMBULATORY_CARE_PROVIDER_SITE_OTHER): Payer: Medicare HMO | Admitting: Family Medicine

## 2021-02-23 ENCOUNTER — Encounter: Payer: Self-pay | Admitting: Family Medicine

## 2021-02-23 ENCOUNTER — Other Ambulatory Visit: Payer: Self-pay

## 2021-02-23 VITALS — BP 123/75 | HR 79 | Temp 98.7°F | Resp 18 | Ht 62.0 in | Wt 175.0 lb

## 2021-02-23 DIAGNOSIS — J418 Mixed simple and mucopurulent chronic bronchitis: Secondary | ICD-10-CM

## 2021-02-23 DIAGNOSIS — R0902 Hypoxemia: Secondary | ICD-10-CM

## 2021-02-23 DIAGNOSIS — U071 COVID-19: Secondary | ICD-10-CM

## 2021-02-23 DIAGNOSIS — M069 Rheumatoid arthritis, unspecified: Secondary | ICD-10-CM

## 2021-02-23 DIAGNOSIS — R69 Illness, unspecified: Secondary | ICD-10-CM | POA: Diagnosis not present

## 2021-02-23 DIAGNOSIS — F0390 Unspecified dementia without behavioral disturbance: Secondary | ICD-10-CM | POA: Diagnosis not present

## 2021-02-23 DIAGNOSIS — G20A1 Parkinson's disease without dyskinesia, without mention of fluctuations: Secondary | ICD-10-CM

## 2021-02-23 DIAGNOSIS — G20 Parkinson's disease: Secondary | ICD-10-CM

## 2021-02-23 NOTE — Progress Notes (Signed)
I,Morgan Dennis,acting as a scribe for Morgan Durie, MD.,have documented all relevant documentation on the behalf of Morgan Durie, MD,as directed by  Morgan Durie, MD while in the presence of Morgan Durie, MD.   Established patient visit   Patient: Morgan Dennis   DOB: [birth year]   [name]  MRN: [id number] Visit Date: 02/23/2021  Today's healthcare provider: [name], MD   Chief Complaint  Patient presents with  . Follow-up  . Dementia   Subjective    HPI  Patient comes in today for follow-up.  She continues to get weaker and her dementia is getting worse. Is a chronic fatigue since Covid and apparently she has been somewhat hypoxic during that entire time. Resting pulse oximeter in office today is 86 and she states she is feeling fine. Follow up for dementia  The patient was last seen for this 3 months ago. Changes made at last visit include; MMSE is 18/30.  No behavioral disturbance.  She is worsening.  I think this decline is dictating her overall health issues along with Parkinson's disease.  She reports good compliance with treatment. She feels that condition is Worse. She is not having side effects.   Vaginal bleeding From 11/24/2020- Family is agreeable to following this.  There is no active bleeding.  It is brown spotting on pads.  Would not actively pursue this unless it worsens.  Patient is in no acute distress. This has not recurred.      Medications: Outpatient Medications Prior to Visit  Medication Sig Note  . abatacept (ORENCIA) 250 MG injection Inject 125 mg into the vein every 30 (thirty) days. Due for next dose 03/02/20 (Tuesday)   . acetaminophen (TYLENOL) 325 MG tablet Take 2 tablets (650 mg total) by mouth every 6 (six) hours as needed for mild pain (or Fever >/= 101).   Marland Kitchen amLODipine (NORVASC) 5 MG tablet Take 1 tablet (5 mg total) by mouth daily.   Marland Kitchen ascorbic acid (VITAMIN C) 1000 MG tablet Take 1,000 mg by  mouth daily.    Marland Kitchen CALCIUM PO Take 2,000 mg by mouth daily.   . carbidopa-levodopa (SINEMET CR) 50-200 MG tablet Take 1 tablet by mouth at bedtime.   . carbidopa-levodopa (SINEMET IR) 25-100 MG tablet Take 2 tablets by mouth 3 (three) times daily.   . Cholecalciferol (VITAMIN D3) 2000 UNITS TABS Take 2,000 Units by mouth daily.    Marland Kitchen denosumab (PROLIA) 60 MG/ML SOSY injection Inject 60 mg into the skin every 6 (six) months.   . docusate sodium (COLACE) 100 MG capsule Take 100 mg by mouth daily as needed.    . donepezil (ARICEPT) 10 MG tablet Take 10 mg by mouth daily.    . DULoxetine (CYMBALTA) 60 MG capsule Take 60 mg by mouth daily.   . folic acid (FOLVITE) 1 MG tablet Take 1 mg by mouth daily.   Marland Kitchen gabapentin (NEURONTIN) 600 MG tablet Take 1 tablet (600 mg total) by mouth 2 (two) times daily. (Patient taking differently: Take 300 mg by mouth 2 (two) times daily.)   . HYDROcodone-acetaminophen (NORCO) 7.5-325 MG tablet Take 0.5-1 tablets by mouth 3 (three) times daily as needed for moderate pain.    . Melatonin 5 MG TABS Take 5 mg by mouth at bedtime.    . memantine (NAMENDA) 10 MG tablet Take 10 mg by mouth 2 (two) times daily.   . methotrexate 2.5 MG tablet Take 15 mg by  mouth every Wednesday.    . polyethylene glycol (MIRALAX / GLYCOLAX) 17 g packet Take 17 g by mouth daily as needed.   . pregabalin (LYRICA) 150 MG capsule 1 capsule DAILY (route: oral) 02/23/2021: Med Classification: Central Nervous System Agents  . solifenacin (VESICARE) 5 MG tablet Take 1 tablet (5 mg total) by mouth daily.   Marland Kitchen sulfamethoxazole-trimethoprim (BACTRIM) 400-80 MG tablet Take 1 tablet by mouth 2 (two) times daily.   . vitamin E 400 UNIT capsule Take 400 Units by mouth daily.   Marland Kitchen ascorbic acid (VITAMIN C) 500 MG tablet Take 1 tablet (500 mg total) by mouth daily. (Patient not taking: Reported on 02/23/2021)   . Multiple Vitamin (MULTIVITAMIN WITH MINERALS) TABS tablet Take 1 tablet by mouth daily. (Patient not  taking: Reported on 02/23/2021)   . zinc sulfate 220 (50 Zn) MG capsule Take 1 capsule (220 mg total) by mouth daily. (Patient not taking: Reported on 02/23/2021)    No facility-administered medications prior to visit.    Review of Systems  Constitutional: Negative.   Respiratory: Negative for cough and shortness of breath.   Cardiovascular: Negative for chest pain, palpitations and leg swelling.  Musculoskeletal: Negative for arthralgias and myalgias.  Neurological: Negative for dizziness and headaches.  Psychiatric/Behavioral: Negative for agitation, self-injury, sleep disturbance and suicidal ideas. The patient is not nervous/anxious.         Objective    BP 123/75 (BP Location: Left Arm, Patient Position: Sitting, Cuff Size: Large)   Pulse 79   Temp 98.7 F (37.1 C) (Oral)   Resp 18   Ht 5\' 2"  (1.575 m)   Wt 175 lb (79.4 kg)   SpO2 (!) 86%   BMI 32.01 kg/m  BP Readings from Last 3 Encounters:  02/23/21 123/75  11/24/20 131/78  11/03/20 126/74   Wt Readings from Last 3 Encounters:  02/23/21 175 lb (79.4 kg)  11/24/20 172 lb (78 kg)  11/03/20 162 lb (73.5 kg)       Physical Exam Vitals reviewed.  Constitutional:      Appearance: Normal appearance. She is normal weight.  HENT:     Head: Normocephalic and atraumatic.     Right Ear: Tympanic membrane, ear canal and external ear normal.     Left Ear: Tympanic membrane, ear canal and external ear normal.     Nose: Nose normal.     Mouth/Throat:     Mouth: Mucous membranes are moist.     Pharynx: Oropharynx is clear.  Eyes:     Extraocular Movements: Extraocular movements intact.     Conjunctiva/sclera: Conjunctivae normal.     Pupils: Pupils are equal, round, and reactive to light.  Cardiovascular:     Rate and Rhythm: Normal rate and regular rhythm.     Pulses: Normal pulses.     Heart sounds: Normal heart sounds.  Pulmonary:     Effort: Pulmonary effort is normal.     Breath sounds: Normal breath sounds.   Abdominal:     Palpations: Abdomen is soft.  Musculoskeletal:     Cervical back: Normal range of motion and neck supple.  Skin:    General: Skin is warm and dry.  Neurological:     Mental Status: She is alert. Mental status is at baseline. She is disoriented.  Psychiatric:        Mood and Affect: Mood normal.        Behavior: Behavior normal.        Thought Content: Thought  content normal.        Judgment: Judgment normal.       No results found for any visits on 02/23/21.  Assessment & Plan     1. Dementia without behavioral disturbance, unspecified dementia type (HCC) Progressive.  Home with husband and son - For home use only DME oxygen  2. Hypoxia Almost certainly secondary to history of Covid pneumonia.  No cough and no history of heart failure. Home O2 at 2 L/min - For home use only DME oxygen  3. COVID-19 History of Covid pneumonia  4. Mixed simple and mucopurulent chronic bronchitis ([place])  - For home use only DME oxygen  5. Parkinson's disease ([place]) Followed by neurology  6. Rheumatoid arthritis involving multiple sites, unspecified whether rheumatoid factor present ([place]) Followed by rheumatology   No follow-ups on file.      I, Morgan Durie, MD, have reviewed all documentation for this visit. The documentation on 02/26/21 for the exam, diagnosis, procedures, and orders are all accurate and complete.    Ysmael Hires Cranford Mon, MD  [facility] [phone] (phone) [phone] (fax)  [place]

## 2021-02-23 NOTE — Patient Instructions (Signed)
Stop gabapentin.

## 2021-03-03 DIAGNOSIS — J42 Unspecified chronic bronchitis: Secondary | ICD-10-CM | POA: Diagnosis not present

## 2021-03-03 DIAGNOSIS — J449 Chronic obstructive pulmonary disease, unspecified: Secondary | ICD-10-CM | POA: Diagnosis not present

## 2021-03-07 DIAGNOSIS — M810 Age-related osteoporosis without current pathological fracture: Secondary | ICD-10-CM | POA: Diagnosis not present

## 2021-03-07 DIAGNOSIS — R609 Edema, unspecified: Secondary | ICD-10-CM | POA: Diagnosis not present

## 2021-03-07 DIAGNOSIS — Z79899 Other long term (current) drug therapy: Secondary | ICD-10-CM | POA: Diagnosis not present

## 2021-03-07 DIAGNOSIS — G20 Parkinson's disease: Secondary | ICD-10-CM | POA: Diagnosis not present

## 2021-03-07 DIAGNOSIS — M0579 Rheumatoid arthritis with rheumatoid factor of multiple sites without organ or systems involvement: Secondary | ICD-10-CM | POA: Diagnosis not present

## 2021-03-15 DIAGNOSIS — M48062 Spinal stenosis, lumbar region with neurogenic claudication: Secondary | ICD-10-CM | POA: Diagnosis not present

## 2021-03-15 DIAGNOSIS — Z79891 Long term (current) use of opiate analgesic: Secondary | ICD-10-CM | POA: Diagnosis not present

## 2021-03-15 DIAGNOSIS — M5136 Other intervertebral disc degeneration, lumbar region: Secondary | ICD-10-CM | POA: Diagnosis not present

## 2021-03-15 DIAGNOSIS — M5416 Radiculopathy, lumbar region: Secondary | ICD-10-CM | POA: Diagnosis not present

## 2021-03-16 ENCOUNTER — Other Ambulatory Visit: Payer: Self-pay | Admitting: Family Medicine

## 2021-03-16 DIAGNOSIS — N3281 Overactive bladder: Secondary | ICD-10-CM

## 2021-03-16 NOTE — Telephone Encounter (Signed)
Requested medication (s) are due for refill today: expired medications  Requested medication (s) are on the active medication list: yes  Last refill:  norvasc- 12/14/2019 #30 1 refill, vesicare 03/08/20 #30 0 refills   Future visit scheduled: yes in 1 month   Notes to clinic:  expired medications. Do you want to renew Rx?     Requested Prescriptions  Pending Prescriptions Disp Refills   amLODipine (NORVASC) 5 MG tablet [Pharmacy Med Name: AMLODIPINE BESYLATE 5 MG TAB] 30 tablet 0    Sig: Take 1 tablet (5 mg total) by mouth daily.      Cardiovascular:  Calcium Channel Blockers Passed - 03/16/2021  5:34 PM      Passed - Last BP in normal range    BP Readings from Last 1 Encounters:  02/23/21 123/75          Passed - Valid encounter within last 6 months    Recent Outpatient Visits           3 weeks ago Dementia without behavioral disturbance, unspecified dementia type [facility])   [facility] Jerrol Banana., MD   3 months ago Dementia without behavioral disturbance, unspecified dementia type [facility])   [facility] Jerrol Banana., MD   4 months ago Vaginal bleeding   [facility] Jerrol Banana., MD   5 months ago Vaginal discharge   [facility] Fenton Malling M, [place]   8 months ago Annual physical exam   [facility] Jerrol Banana., MD       Future Appointments             In 1 month Jerrol Banana., MD [facility]               solifenacin (VESICARE) 5 MG tablet [Pharmacy Med Name: SOLIFENACIN 5 MG TABLET] 30 tablet 0    Sig: Take 1 tablet (5 mg total) by mouth daily.      Urology:  Bladder Agents Passed - 03/16/2021  5:34 PM      Passed - Valid encounter within last 12 months    Recent Outpatient Visits           3 weeks ago Dementia without behavioral disturbance, unspecified dementia type [facility])   [facility] Jerrol Banana., MD   3 months ago Dementia without behavioral disturbance, unspecified dementia type [facility])   [facility] Jerrol Banana., MD   4 months ago Vaginal bleeding   [facility] Jerrol Banana., MD   5 months ago Vaginal discharge   [facility] Fenton Malling [place], [place]   8 months ago Annual physical exam   [facility] Jerrol Banana., MD       Future Appointments             In 1 month Jerrol Banana., MD [facility], [place]

## 2021-03-17 NOTE — Telephone Encounter (Signed)
L.O.V. was on the 02/23/2021 and upcoming appointment is on 04/27/2021. Never ordered by Dr. Rosanna Randy.

## 2021-03-21 MED ORDER — SOLIFENACIN SUCCINATE 5 MG PO TABS: 5.0000 mg | ORAL_TABLET | Freq: Every day | ORAL | 1 refills | Status: DC

## 2021-03-24 ENCOUNTER — Other Ambulatory Visit: Payer: Self-pay | Admitting: Family Medicine

## 2021-04-03 DIAGNOSIS — J449 Chronic obstructive pulmonary disease, unspecified: Secondary | ICD-10-CM | POA: Diagnosis not present

## 2021-04-03 DIAGNOSIS — J42 Unspecified chronic bronchitis: Secondary | ICD-10-CM | POA: Diagnosis not present

## 2021-04-12 ENCOUNTER — Other Ambulatory Visit: Payer: Self-pay | Admitting: Family Medicine

## 2021-04-12 DIAGNOSIS — N3281 Overactive bladder: Secondary | ICD-10-CM

## 2021-04-22 ENCOUNTER — Telehealth: Payer: Self-pay

## 2021-04-22 DIAGNOSIS — M79676 Pain in unspecified toe(s): Secondary | ICD-10-CM

## 2021-04-22 NOTE — Telephone Encounter (Signed)
Copied from [place] [id number]. Topic: Referral - Request for Referral >> Apr 22, 2021 12:37 PM Tessa Lerner A wrote: Has patient seen PCP for this complaint? Yes  *If NO, is insurance requiring patient see PCP for this issue before PCP can refer them?  Referral for which specialty: Podiatry   Preferred provider/office: patient would like to be seen in [facility]  Reason for referral: Patient is having swelling and toenail cracking

## 2021-04-22 NOTE — Telephone Encounter (Signed)
Okay with me if they cancel appointment with podiatry.

## 2021-04-22 NOTE — Telephone Encounter (Signed)
Copied from [place] [id number]. Topic: General - Other >> Apr 22, 2021 12:42 PM Tessa Lerner A wrote: Reason for CRM: Patient is scheduled to be seen 5/11 at 3:00 PM  Patient's son would like to know what patient is being seen for before they are brought for the appointment  The appointment note references a follow up, and the previous visit references a follow up as well.   Please contact patient's son to further advise when possible

## 2021-04-25 NOTE — Telephone Encounter (Signed)
Patient son Morgan Dennis called in for Ernst Bowler states that he need for the patient to get an appointment with a podiatrist and also would like to know what all these follow up visits are for. Please call AJ at Ph# [phone]

## 2021-04-25 NOTE — Telephone Encounter (Signed)
Left message to call back. It is unclear if pts son wants to cancel our (Dr Rosanna Randy appt) or podiatry appt?

## 2021-04-26 NOTE — Telephone Encounter (Signed)
Order for podiatry referral was placed. Dr. Rosanna Randy is ok with cancelling appt that is scheduled for tomorrow. Was just a routine FU. Called [name]) to advise and no answer. Left message to call back if he had anymore questions.

## 2021-04-27 ENCOUNTER — Ambulatory Visit: Payer: Self-pay | Admitting: Family Medicine

## 2021-05-03 DIAGNOSIS — J42 Unspecified chronic bronchitis: Secondary | ICD-10-CM | POA: Diagnosis not present

## 2021-05-03 DIAGNOSIS — J449 Chronic obstructive pulmonary disease, unspecified: Secondary | ICD-10-CM | POA: Diagnosis not present

## 2021-05-04 ENCOUNTER — Other Ambulatory Visit: Payer: Self-pay

## 2021-05-04 ENCOUNTER — Ambulatory Visit: Payer: Medicare HMO | Admitting: Podiatry

## 2021-05-04 ENCOUNTER — Encounter: Payer: Self-pay | Admitting: Podiatry

## 2021-05-04 DIAGNOSIS — M79676 Pain in unspecified toe(s): Secondary | ICD-10-CM

## 2021-05-04 DIAGNOSIS — B351 Tinea unguium: Secondary | ICD-10-CM

## 2021-05-04 DIAGNOSIS — H9190 Unspecified hearing loss, unspecified ear: Secondary | ICD-10-CM | POA: Insufficient documentation
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
## 2021-05-13 ENCOUNTER — Other Ambulatory Visit: Payer: Self-pay | Admitting: Family Medicine

## 2021-05-13 DIAGNOSIS — N3281 Overactive bladder: Secondary | ICD-10-CM

## 2021-06-03 DIAGNOSIS — J42 Unspecified chronic bronchitis: Secondary | ICD-10-CM | POA: Diagnosis not present

## 2021-06-03 DIAGNOSIS — J449 Chronic obstructive pulmonary disease, unspecified: Secondary | ICD-10-CM | POA: Diagnosis not present

## 2021-06-07 ENCOUNTER — Ambulatory Visit (INDEPENDENT_AMBULATORY_CARE_PROVIDER_SITE_OTHER): Payer: Medicare HMO | Admitting: Family Medicine

## 2021-06-07 ENCOUNTER — Encounter: Payer: Self-pay | Admitting: Family Medicine

## 2021-06-07 ENCOUNTER — Other Ambulatory Visit: Payer: Self-pay

## 2021-06-07 VITALS — BP 125/79 | HR 72 | Resp 18

## 2021-06-07 DIAGNOSIS — M8000XD Age-related osteoporosis with current pathological fracture, unspecified site, subsequent encounter for fracture with routine healing: Secondary | ICD-10-CM | POA: Diagnosis not present

## 2021-06-07 DIAGNOSIS — M069 Rheumatoid arthritis, unspecified: Secondary | ICD-10-CM | POA: Diagnosis not present

## 2021-06-07 DIAGNOSIS — F0390 Unspecified dementia without behavioral disturbance: Secondary | ICD-10-CM | POA: Diagnosis not present

## 2021-06-07 DIAGNOSIS — Z111 Encounter for screening for respiratory tuberculosis: Secondary | ICD-10-CM

## 2021-06-07 DIAGNOSIS — N952 Postmenopausal atrophic vaginitis: Secondary | ICD-10-CM

## 2021-06-07 DIAGNOSIS — E782 Mixed hyperlipidemia: Secondary | ICD-10-CM | POA: Diagnosis not present

## 2021-06-07 DIAGNOSIS — G20 Parkinson's disease: Secondary | ICD-10-CM

## 2021-06-07 DIAGNOSIS — M0579 Rheumatoid arthritis with rheumatoid factor of multiple sites without organ or systems involvement: Secondary | ICD-10-CM | POA: Diagnosis not present

## 2021-06-07 DIAGNOSIS — R69 Illness, unspecified: Secondary | ICD-10-CM | POA: Diagnosis not present

## 2021-06-07 NOTE — Progress Notes (Signed)
I,[name],acting as a scribe for [name], MD.,have documented all relevant documentation on the behalf of [name], MD,as directed by  [name], MD while in the presence of [name], MD.   Established patient visit   Patient: Morgan Dennis   DOB: 01-Sep-1934   [name]  MRN: [id number] Visit Date: 06/07/2021  Today's healthcare provider: [name], MD   No chief complaint on file.  Subjective    HPI Patient presents today needing to have TB screening.  Patient is having a recent bleed died of a massive stroke.  She has been living at home to this point but she now needs to go in assisted living memory care for her progressive dementia.  She has multiple other medical problems that are problematic also.  Because of incontinence she must wear a diaper daily. She has had no further significant vaginal bleeding.     Medications: Outpatient Medications Prior to Visit  Medication Sig   abatacept (ORENCIA) 250 MG injection Inject 125 mg into the vein every 30 (thirty) days. Due for next dose 03/02/20 (Tuesday)   acetaminophen (TYLENOL) 325 MG tablet Take 2 tablets (650 mg total) by mouth every 6 (six) hours as needed for mild pain (or Fever >/= 101).   amLODipine (NORVASC) 5 MG tablet Take 1 tablet (5 mg total) by mouth daily.   ascorbic acid (VITAMIN C) 1000 MG tablet Take 1,000 mg by mouth daily.    ascorbic acid (VITAMIN C) 500 MG tablet Take 1 tablet (500 mg total) by mouth daily. (Patient not taking: Reported on 02/23/2021)   CALCIUM PO Take 2,000 mg by mouth daily.   carbidopa-levodopa (SINEMET CR) 50-200 MG tablet Take 1 tablet by mouth at bedtime.   carbidopa-levodopa (SINEMET IR) 25-100 MG tablet Take 2 tablets by mouth 3 (three) times daily.   Cholecalciferol (VITAMIN D3) 2000 UNITS TABS Take 2,000 Units by mouth daily.    denosumab (PROLIA) 60 MG/ML SOSY injection Inject 60 mg into the skin every 6 (six) months.    docusate sodium (COLACE) 100 MG capsule Take 100 mg by mouth daily as needed.    donepezil (ARICEPT) 10 MG tablet Take 10 mg by mouth daily.    DULoxetine (CYMBALTA) 60 MG capsule Take 60 mg by mouth daily.   folic acid (FOLVITE) 1 MG tablet Take 1 mg by mouth daily.   gabapentin (NEURONTIN) 600 MG tablet Take 1 tablet (600 mg total) by mouth 2 (two) times daily. (Patient taking differently: Take 300 mg by mouth 2 (two) times daily.)   HYDROcodone-acetaminophen (NORCO) 7.5-325 MG tablet Take 0.5-1 tablets by mouth 3 (three) times daily as needed for moderate pain.    Melatonin 5 MG TABS Take 5 mg by mouth at bedtime.    memantine (NAMENDA) 10 MG tablet Take 10 mg by mouth 2 (two) times daily.   methotrexate 2.5 MG tablet Take 15 mg by mouth every Wednesday.    Multiple Vitamin (MULTIVITAMIN WITH MINERALS) TABS tablet Take 1 tablet by mouth daily. (Patient not taking: Reported on 02/23/2021)   polyethylene glycol (MIRALAX / GLYCOLAX) 17 g packet Take 17 g by mouth daily as needed.   pregabalin (LYRICA) 150 MG capsule 1 capsule DAILY (route: oral)   solifenacin (VESICARE) 5 MG tablet Take 1 tablet (5 mg total) by mouth daily.   vitamin E 400 UNIT capsule Take 400 Units by mouth daily.   zinc sulfate 220 (50 Zn)  MG capsule Take 1 capsule (220 mg total) by mouth daily. (Patient not taking: Reported on 02/23/2021)   No facility-administered medications prior to visit.    Review of Systems      Objective    BP 125/79 (BP Location: Left Arm, Patient Position: Sitting, Cuff Size: Large)   Pulse 72   Resp 18   SpO2 94%  BP Readings from Last 3 Encounters:  06/07/21 125/79  02/23/21 123/75  11/24/20 131/78   Wt Readings from Last 3 Encounters:  02/23/21 175 lb (79.4 kg)  11/24/20 172 lb (78 kg)  11/03/20 162 lb (73.5 kg)       Physical Exam Vitals reviewed.  Constitutional:      Appearance: Normal appearance. She is normal weight.  HENT:     Head: Normocephalic and atraumatic.      Right Ear: Tympanic membrane, ear canal and external ear normal.     Left Ear: Tympanic membrane, ear canal and external ear normal.     Nose: Nose normal.     Mouth/Throat:     Mouth: Mucous membranes are moist.     Pharynx: Oropharynx is clear.  Eyes:     Extraocular Movements: Extraocular movements intact.     Conjunctiva/sclera: Conjunctivae normal.     Pupils: Pupils are equal, round, and reactive to light.  Cardiovascular:     Rate and Rhythm: Normal rate and regular rhythm.     Pulses: Normal pulses.     Heart sounds: Normal heart sounds.  Pulmonary:     Effort: Pulmonary effort is normal.     Breath sounds: Normal breath sounds.  Abdominal:     Palpations: Abdomen is soft.  Musculoskeletal:     Cervical back: Normal range of motion and neck supple.  Skin:    General: Skin is warm and dry.  Neurological:     Mental Status: She is alert. Mental status is at baseline. She is disoriented.  Psychiatric:        Mood and Affect: Mood normal.        Behavior: Behavior normal.        Thought Content: Thought content normal.        Judgment: Judgment normal.      No results found for any visits on 06/07/21.  Assessment & Plan     1. Dementia without behavioral disturbance, unspecified dementia type ([place]) Continue donepezil and Namenda  2. Screening-pulmonary TB For placement in assisted living/memory care unit - QuantiFERON-TB Gold Plus  3. Parkinson's disease ([place]) On Sinemet per neurology - TSH - CBC w/Diff/Platelet - Comprehensive Metabolic Panel (CMET)  4. Rheumatoid arthritis involving multiple sites, unspecified whether rheumatoid factor present ([place]) Followed by rheumatology - TSH - CBC w/Diff/Platelet - Comprehensive Metabolic Panel (CMET)  5. Mixed hyperlipidemia  - TSH - CBC w/Diff/Platelet - Comprehensive Metabolic Panel (CMET)  6. Atrophic vaginitis No recent bleeding.  We will only work-up if this becomes problematic.  Been a joint decision  with patient and family  7. Rheumatoid arthritis involving multiple sites with positive rheumatoid factor ([place]) Followed by rheumatology  8. Age-related osteoporosis with current pathological fracture with routine healing, subsequent encounter On Prolia   No follow-ups on file.      I, [name], MD, have reviewed all documentation for this visit. The documentation on 06/12/21 for the exam, diagnosis, procedures, and orders are all accurate and complete.    [name], MD  [facility] [phone] (phone) [phone] (fax)  [facility]  

## 2021-06-11 LAB — QUANTIFERON-TB GOLD PLUS
QuantiFERON Mitogen Value: >10 IU/mL
QuantiFERON Nil Value: 0.12 IU/mL
QuantiFERON TB1 Ag Value: 0.08 IU/mL
QuantiFERON TB2 Ag Value: 0.08 IU/mL
QuantiFERON-TB Gold Plus: NEGATIVE

## 2021-06-11 LAB — COMPREHENSIVE METABOLIC PANEL
ALT: 6 IU/L (ref 0–32)
AST: 14 IU/L (ref 0–40)
Albumin/Globulin Ratio: 1.7 (ref 1.2–2.2)
Albumin: 4.3 g/dL (ref 3.6–4.6)
Alkaline Phosphatase: 84 IU/L (ref 44–121)
BUN/Creatinine Ratio: 24 (ref 12–28)
BUN: 18 mg/dL (ref 8–27)
Bilirubin Total: 0.2 mg/dL (ref 0.0–1.2)
CO2: 25 mmol/L (ref 20–29)
Calcium: 9.3 mg/dL (ref 8.7–10.3)
Chloride: 103 mmol/L (ref 96–106)
Creatinine, Ser: 0.76 mg/dL (ref 0.57–1.00)
Globulin, Total: 2.5 g/dL (ref 1.5–4.5)
Glucose: 82 mg/dL (ref 65–99)
Potassium: 4.6 mmol/L (ref 3.5–5.2)
Sodium: 139 mmol/L (ref 134–144)
Total Protein: 6.8 g/dL (ref 6.0–8.5)
eGFR: 77 mL/min/{1.73_m2} (ref >59)

## 2021-06-11 LAB — CBC WITH DIFFERENTIAL/PLATELET
Basophils Absolute: 0.1 10*3/uL (ref 0.0–0.2)
Basos: 1 %
EOS (ABSOLUTE): 0.2 10*3/uL (ref 0.0–0.4)
Eos: 4 %
Hematocrit: 40.4 % (ref 34.0–46.6)
Hemoglobin: 13.3 g/dL (ref 11.1–15.9)
Immature Grans (Abs): 0 10*3/uL (ref 0.0–0.1)
Immature Granulocytes: 0 %
Lymphocytes Absolute: 2.3 10*3/uL (ref 0.7–3.1)
Lymphs: 34 %
MCH: 31.8 pg (ref 26.6–33.0)
MCHC: 32.9 g/dL (ref 31.5–35.7)
MCV: 97 fL (ref 79–97)
Monocytes Absolute: 0.9 10*3/uL (ref 0.1–0.9)
Monocytes: 13 %
Neutrophils Absolute: 3.3 10*3/uL (ref 1.4–7.0)
Neutrophils: 48 %
Platelets: 164 10*3/uL (ref 150–450)
RBC: 4.18 x10E6/uL (ref 3.77–5.28)
RDW: 13.7 % (ref 11.7–15.4)
WBC: 6.8 10*3/uL (ref 3.4–10.8)

## 2021-06-11 LAB — TSH: TSH: 2.64 u[IU]/mL (ref 0.450–4.500)

## 2021-06-15 ENCOUNTER — Telehealth: Payer: Self-pay

## 2021-06-15 NOTE — Telephone Encounter (Signed)
Pt's son [name] came yesterday to pick up pt's FL2 forms. [name] stated that the facility requires pt's med list signed by a provider. Mitch stated pt is no longer taking Amlodipine and Dr. Rosanna Randy was advised at pt's last visit but son noticed it is still on pt's med list. [name] is requesting Amlodipine be removed and Dr. B sign pt's med list if possible. [name] is requesting to pick this up tomorrow if possible. Please advise. Thanks -[employer]

## 2021-06-16 NOTE — Telephone Encounter (Signed)
Completed. Pt's son [name] advised.

## 2021-06-17 ENCOUNTER — Telehealth: Payer: Self-pay | Admitting: Family Medicine

## 2021-06-17 DIAGNOSIS — R293 Abnormal posture: Secondary | ICD-10-CM | POA: Diagnosis not present

## 2021-06-17 DIAGNOSIS — R278 Other lack of coordination: Secondary | ICD-10-CM | POA: Diagnosis not present

## 2021-06-17 DIAGNOSIS — G20 Parkinson's disease: Secondary | ICD-10-CM | POA: Diagnosis not present

## 2021-06-17 DIAGNOSIS — M4805 Spinal stenosis, thoracolumbar region: Secondary | ICD-10-CM | POA: Diagnosis not present

## 2021-06-17 DIAGNOSIS — M0589 Other rheumatoid arthritis with rheumatoid factor of multiple sites: Secondary | ICD-10-CM | POA: Diagnosis not present

## 2021-06-17 DIAGNOSIS — M6281 Muscle weakness (generalized): Secondary | ICD-10-CM | POA: Diagnosis not present

## 2021-06-17 NOTE — Telephone Encounter (Signed)
Ebony Hail pharmacist from [facility] is calling back   to ask about orencina - Is this prescibed by Dr. Rosanna Randy? Or aware who does? When was the last injection? And needing a script/  Vit C 1000Mg  and Vit C 500 Mg needing to know which one is valid?  [place] sig states take 2000mg  daily - it does not come in 2000mg  - it comes in 500mg ,   Prolia is also an injection - who prescribes? When was the last injection? Needing script as well. Is this self adminsted or is someone else given this to her?  Please advise [phone]

## 2021-06-17 NOTE — Telephone Encounter (Incomplete)
Morgan Dennis called back asking if Dr. Rosanna Randy Prescribes her Lyrica if so they need a new prescriptions for that too.  If he does not please let them know who the pre scriber is. She has more question regarding patients med.  If a nurse can call back and talk to [place].  [phone].

## 2021-06-17 NOTE — Telephone Encounter (Signed)
Ebony Hail the pharmacist from [facility] phone # [phone] requesting HYDROcodone-acetaminophen ([employer]) 7.5-325 MG tablet to reflect 1 tablet by mouth 3x daily instead of 0.5mg . Pharmacist would like request expedited and a follow up call regarding the status

## 2021-06-19 DIAGNOSIS — M6281 Muscle weakness (generalized): Secondary | ICD-10-CM | POA: Diagnosis not present

## 2021-06-19 DIAGNOSIS — R293 Abnormal posture: Secondary | ICD-10-CM | POA: Diagnosis not present

## 2021-06-19 DIAGNOSIS — M4805 Spinal stenosis, thoracolumbar region: Secondary | ICD-10-CM | POA: Diagnosis not present

## 2021-06-19 DIAGNOSIS — R278 Other lack of coordination: Secondary | ICD-10-CM | POA: Diagnosis not present

## 2021-06-19 DIAGNOSIS — M0589 Other rheumatoid arthritis with rheumatoid factor of multiple sites: Secondary | ICD-10-CM | POA: Diagnosis not present

## 2021-06-19 DIAGNOSIS — G20 Parkinson's disease: Secondary | ICD-10-CM | POA: Diagnosis not present

## 2021-06-21 NOTE — Telephone Encounter (Signed)
Pharmacist has been advised as below. KW

## 2021-06-21 NOTE — Telephone Encounter (Signed)
Please review. Thanks!  

## 2021-06-22 DIAGNOSIS — M4805 Spinal stenosis, thoracolumbar region: Secondary | ICD-10-CM | POA: Diagnosis not present

## 2021-06-22 DIAGNOSIS — R278 Other lack of coordination: Secondary | ICD-10-CM | POA: Diagnosis not present

## 2021-06-22 DIAGNOSIS — G20 Parkinson's disease: Secondary | ICD-10-CM | POA: Diagnosis not present

## 2021-06-22 DIAGNOSIS — M0589 Other rheumatoid arthritis with rheumatoid factor of multiple sites: Secondary | ICD-10-CM | POA: Diagnosis not present

## 2021-06-22 DIAGNOSIS — M6281 Muscle weakness (generalized): Secondary | ICD-10-CM | POA: Diagnosis not present

## 2021-06-22 DIAGNOSIS — R293 Abnormal posture: Secondary | ICD-10-CM | POA: Diagnosis not present

## 2021-06-23 ENCOUNTER — Telehealth: Payer: Self-pay

## 2021-06-23 DIAGNOSIS — G20 Parkinson's disease: Secondary | ICD-10-CM | POA: Diagnosis not present

## 2021-06-23 DIAGNOSIS — J9610 Chronic respiratory failure, unspecified whether with hypoxia or hypercapnia: Secondary | ICD-10-CM | POA: Diagnosis not present

## 2021-06-23 DIAGNOSIS — M48061 Spinal stenosis, lumbar region without neurogenic claudication: Secondary | ICD-10-CM | POA: Diagnosis not present

## 2021-06-23 DIAGNOSIS — M0520 Rheumatoid vasculitis with rheumatoid arthritis of unspecified site: Secondary | ICD-10-CM | POA: Diagnosis not present

## 2021-06-23 DIAGNOSIS — G309 Alzheimer's disease, unspecified: Secondary | ICD-10-CM | POA: Diagnosis not present

## 2021-06-23 DIAGNOSIS — N950 Postmenopausal bleeding: Secondary | ICD-10-CM | POA: Diagnosis not present

## 2021-06-23 NOTE — Telephone Encounter (Signed)
Copied from [place] [phone]. Topic: General - Call Back - No Documentation >> Jun 16, 2021  3:41 PM Erick Blinks wrote: Reason for CRM: Pharmacy called reporting that they have questions about some of the pt's Rxs. Please call back and advise  Best contact: [phone] [place] from the [employer]

## 2021-06-23 NOTE — Telephone Encounter (Signed)
[place] states this issues has been resolved.   Thanks,    -Mickel Baas

## 2021-06-24 DIAGNOSIS — I10 Essential (primary) hypertension: Secondary | ICD-10-CM | POA: Diagnosis not present

## 2021-06-28 DIAGNOSIS — D518 Other vitamin B12 deficiency anemias: Secondary | ICD-10-CM | POA: Diagnosis not present

## 2021-06-28 DIAGNOSIS — E612 Magnesium deficiency: Secondary | ICD-10-CM | POA: Diagnosis not present

## 2021-06-28 DIAGNOSIS — Z79899 Other long term (current) drug therapy: Secondary | ICD-10-CM | POA: Diagnosis not present

## 2021-06-28 DIAGNOSIS — E7849 Other hyperlipidemia: Secondary | ICD-10-CM | POA: Diagnosis not present

## 2021-06-28 DIAGNOSIS — E038 Other specified hypothyroidism: Secondary | ICD-10-CM | POA: Diagnosis not present

## 2021-06-28 DIAGNOSIS — E559 Vitamin D deficiency, unspecified: Secondary | ICD-10-CM | POA: Diagnosis not present

## 2021-06-28 DIAGNOSIS — E119 Type 2 diabetes mellitus without complications: Secondary | ICD-10-CM | POA: Diagnosis not present

## 2021-06-29 DIAGNOSIS — M6281 Muscle weakness (generalized): Secondary | ICD-10-CM | POA: Diagnosis not present

## 2021-06-29 DIAGNOSIS — M4805 Spinal stenosis, thoracolumbar region: Secondary | ICD-10-CM | POA: Diagnosis not present

## 2021-06-29 DIAGNOSIS — R293 Abnormal posture: Secondary | ICD-10-CM | POA: Diagnosis not present

## 2021-06-29 DIAGNOSIS — R278 Other lack of coordination: Secondary | ICD-10-CM | POA: Diagnosis not present

## 2021-06-29 DIAGNOSIS — M0589 Other rheumatoid arthritis with rheumatoid factor of multiple sites: Secondary | ICD-10-CM | POA: Diagnosis not present

## 2021-06-29 DIAGNOSIS — G20 Parkinson's disease: Secondary | ICD-10-CM | POA: Diagnosis not present

## 2021-06-30 DIAGNOSIS — R293 Abnormal posture: Secondary | ICD-10-CM | POA: Diagnosis not present

## 2021-06-30 DIAGNOSIS — R278 Other lack of coordination: Secondary | ICD-10-CM | POA: Diagnosis not present

## 2021-06-30 DIAGNOSIS — G20 Parkinson's disease: Secondary | ICD-10-CM | POA: Diagnosis not present

## 2021-06-30 DIAGNOSIS — M0589 Other rheumatoid arthritis with rheumatoid factor of multiple sites: Secondary | ICD-10-CM | POA: Diagnosis not present

## 2021-06-30 DIAGNOSIS — M4805 Spinal stenosis, thoracolumbar region: Secondary | ICD-10-CM | POA: Diagnosis not present

## 2021-06-30 DIAGNOSIS — M6281 Muscle weakness (generalized): Secondary | ICD-10-CM | POA: Diagnosis not present

## 2021-07-03 DIAGNOSIS — J449 Chronic obstructive pulmonary disease, unspecified: Secondary | ICD-10-CM | POA: Diagnosis not present

## 2021-07-03 DIAGNOSIS — J42 Unspecified chronic bronchitis: Secondary | ICD-10-CM | POA: Diagnosis not present

## 2021-07-05 DIAGNOSIS — M4805 Spinal stenosis, thoracolumbar region: Secondary | ICD-10-CM | POA: Diagnosis not present

## 2021-07-05 DIAGNOSIS — M6281 Muscle weakness (generalized): Secondary | ICD-10-CM | POA: Diagnosis not present

## 2021-07-05 DIAGNOSIS — R278 Other lack of coordination: Secondary | ICD-10-CM | POA: Diagnosis not present

## 2021-07-05 DIAGNOSIS — M0589 Other rheumatoid arthritis with rheumatoid factor of multiple sites: Secondary | ICD-10-CM | POA: Diagnosis not present

## 2021-07-05 DIAGNOSIS — R293 Abnormal posture: Secondary | ICD-10-CM | POA: Diagnosis not present

## 2021-07-05 DIAGNOSIS — G20 Parkinson's disease: Secondary | ICD-10-CM | POA: Diagnosis not present

## 2021-07-07 DIAGNOSIS — M0589 Other rheumatoid arthritis with rheumatoid factor of multiple sites: Secondary | ICD-10-CM | POA: Diagnosis not present

## 2021-07-07 DIAGNOSIS — G20 Parkinson's disease: Secondary | ICD-10-CM | POA: Diagnosis not present

## 2021-07-07 DIAGNOSIS — M6281 Muscle weakness (generalized): Secondary | ICD-10-CM | POA: Diagnosis not present

## 2021-07-07 DIAGNOSIS — M4805 Spinal stenosis, thoracolumbar region: Secondary | ICD-10-CM | POA: Diagnosis not present

## 2021-07-07 DIAGNOSIS — R278 Other lack of coordination: Secondary | ICD-10-CM | POA: Diagnosis not present

## 2021-07-07 DIAGNOSIS — R293 Abnormal posture: Secondary | ICD-10-CM | POA: Diagnosis not present

## 2021-07-12 DIAGNOSIS — M0589 Other rheumatoid arthritis with rheumatoid factor of multiple sites: Secondary | ICD-10-CM | POA: Diagnosis not present

## 2021-07-12 DIAGNOSIS — M4805 Spinal stenosis, thoracolumbar region: Secondary | ICD-10-CM | POA: Diagnosis not present

## 2021-07-12 DIAGNOSIS — G20 Parkinson's disease: Secondary | ICD-10-CM | POA: Diagnosis not present

## 2021-07-12 DIAGNOSIS — M6281 Muscle weakness (generalized): Secondary | ICD-10-CM | POA: Diagnosis not present

## 2021-07-12 DIAGNOSIS — R293 Abnormal posture: Secondary | ICD-10-CM | POA: Diagnosis not present

## 2021-07-12 DIAGNOSIS — R278 Other lack of coordination: Secondary | ICD-10-CM | POA: Diagnosis not present

## 2021-07-14 DIAGNOSIS — R278 Other lack of coordination: Secondary | ICD-10-CM | POA: Diagnosis not present

## 2021-07-14 DIAGNOSIS — R293 Abnormal posture: Secondary | ICD-10-CM | POA: Diagnosis not present

## 2021-07-14 DIAGNOSIS — M0589 Other rheumatoid arthritis with rheumatoid factor of multiple sites: Secondary | ICD-10-CM | POA: Diagnosis not present

## 2021-07-14 DIAGNOSIS — G20 Parkinson's disease: Secondary | ICD-10-CM | POA: Diagnosis not present

## 2021-07-14 DIAGNOSIS — M6281 Muscle weakness (generalized): Secondary | ICD-10-CM | POA: Diagnosis not present

## 2021-07-14 DIAGNOSIS — M4805 Spinal stenosis, thoracolumbar region: Secondary | ICD-10-CM | POA: Diagnosis not present

## 2021-07-16 DIAGNOSIS — G309 Alzheimer's disease, unspecified: Secondary | ICD-10-CM | POA: Diagnosis not present

## 2021-07-16 DIAGNOSIS — D518 Other vitamin B12 deficiency anemias: Secondary | ICD-10-CM | POA: Diagnosis not present

## 2021-07-16 DIAGNOSIS — E119 Type 2 diabetes mellitus without complications: Secondary | ICD-10-CM | POA: Diagnosis not present

## 2021-07-16 DIAGNOSIS — M810 Age-related osteoporosis without current pathological fracture: Secondary | ICD-10-CM | POA: Diagnosis not present

## 2021-07-16 DIAGNOSIS — E559 Vitamin D deficiency, unspecified: Secondary | ICD-10-CM | POA: Diagnosis not present

## 2021-07-16 DIAGNOSIS — M0520 Rheumatoid vasculitis with rheumatoid arthritis of unspecified site: Secondary | ICD-10-CM | POA: Diagnosis not present

## 2021-07-16 DIAGNOSIS — E038 Other specified hypothyroidism: Secondary | ICD-10-CM | POA: Diagnosis not present

## 2021-07-19 DIAGNOSIS — G20 Parkinson's disease: Secondary | ICD-10-CM | POA: Diagnosis not present

## 2021-07-19 DIAGNOSIS — R278 Other lack of coordination: Secondary | ICD-10-CM | POA: Diagnosis not present

## 2021-07-19 DIAGNOSIS — R293 Abnormal posture: Secondary | ICD-10-CM | POA: Diagnosis not present

## 2021-07-19 DIAGNOSIS — M4805 Spinal stenosis, thoracolumbar region: Secondary | ICD-10-CM | POA: Diagnosis not present

## 2021-07-19 DIAGNOSIS — M6281 Muscle weakness (generalized): Secondary | ICD-10-CM | POA: Diagnosis not present

## 2021-07-19 DIAGNOSIS — M0589 Other rheumatoid arthritis with rheumatoid factor of multiple sites: Secondary | ICD-10-CM | POA: Diagnosis not present

## 2021-07-21 DIAGNOSIS — M1990 Unspecified osteoarthritis, unspecified site: Secondary | ICD-10-CM | POA: Diagnosis not present

## 2021-07-21 DIAGNOSIS — M0520 Rheumatoid vasculitis with rheumatoid arthritis of unspecified site: Secondary | ICD-10-CM | POA: Diagnosis not present

## 2021-07-21 DIAGNOSIS — N950 Postmenopausal bleeding: Secondary | ICD-10-CM | POA: Diagnosis not present

## 2021-07-21 DIAGNOSIS — R011 Cardiac murmur, unspecified: Secondary | ICD-10-CM | POA: Diagnosis not present

## 2021-07-21 DIAGNOSIS — M0589 Other rheumatoid arthritis with rheumatoid factor of multiple sites: Secondary | ICD-10-CM | POA: Diagnosis not present

## 2021-07-21 DIAGNOSIS — G309 Alzheimer's disease, unspecified: Secondary | ICD-10-CM | POA: Diagnosis not present

## 2021-07-21 DIAGNOSIS — R278 Other lack of coordination: Secondary | ICD-10-CM | POA: Diagnosis not present

## 2021-07-21 DIAGNOSIS — R293 Abnormal posture: Secondary | ICD-10-CM | POA: Diagnosis not present

## 2021-07-21 DIAGNOSIS — G20 Parkinson's disease: Secondary | ICD-10-CM | POA: Diagnosis not present

## 2021-07-21 DIAGNOSIS — M48061 Spinal stenosis, lumbar region without neurogenic claudication: Secondary | ICD-10-CM | POA: Diagnosis not present

## 2021-07-21 DIAGNOSIS — G4700 Insomnia, unspecified: Secondary | ICD-10-CM | POA: Diagnosis not present

## 2021-07-21 DIAGNOSIS — M6281 Muscle weakness (generalized): Secondary | ICD-10-CM | POA: Diagnosis not present

## 2021-07-21 DIAGNOSIS — M4805 Spinal stenosis, thoracolumbar region: Secondary | ICD-10-CM | POA: Diagnosis not present

## 2021-07-21 DIAGNOSIS — R600 Localized edema: Secondary | ICD-10-CM | POA: Diagnosis not present

## 2021-07-26 DIAGNOSIS — G20 Parkinson's disease: Secondary | ICD-10-CM | POA: Diagnosis not present

## 2021-07-26 DIAGNOSIS — R293 Abnormal posture: Secondary | ICD-10-CM | POA: Diagnosis not present

## 2021-07-26 DIAGNOSIS — M6281 Muscle weakness (generalized): Secondary | ICD-10-CM | POA: Diagnosis not present

## 2021-07-26 DIAGNOSIS — M4805 Spinal stenosis, thoracolumbar region: Secondary | ICD-10-CM | POA: Diagnosis not present

## 2021-07-26 DIAGNOSIS — R278 Other lack of coordination: Secondary | ICD-10-CM | POA: Diagnosis not present

## 2021-07-26 DIAGNOSIS — M0589 Other rheumatoid arthritis with rheumatoid factor of multiple sites: Secondary | ICD-10-CM | POA: Diagnosis not present

## 2021-07-30 DIAGNOSIS — M0589 Other rheumatoid arthritis with rheumatoid factor of multiple sites: Secondary | ICD-10-CM | POA: Diagnosis not present

## 2021-07-30 DIAGNOSIS — G20 Parkinson's disease: Secondary | ICD-10-CM | POA: Diagnosis not present

## 2021-07-30 DIAGNOSIS — M6281 Muscle weakness (generalized): Secondary | ICD-10-CM | POA: Diagnosis not present

## 2021-07-30 DIAGNOSIS — R293 Abnormal posture: Secondary | ICD-10-CM | POA: Diagnosis not present

## 2021-07-30 DIAGNOSIS — R278 Other lack of coordination: Secondary | ICD-10-CM | POA: Diagnosis not present

## 2021-07-30 DIAGNOSIS — M4805 Spinal stenosis, thoracolumbar region: Secondary | ICD-10-CM | POA: Diagnosis not present

## 2021-08-02 DIAGNOSIS — M0589 Other rheumatoid arthritis with rheumatoid factor of multiple sites: Secondary | ICD-10-CM | POA: Diagnosis not present

## 2021-08-02 DIAGNOSIS — R278 Other lack of coordination: Secondary | ICD-10-CM | POA: Diagnosis not present

## 2021-08-02 DIAGNOSIS — R293 Abnormal posture: Secondary | ICD-10-CM | POA: Diagnosis not present

## 2021-08-02 DIAGNOSIS — M6281 Muscle weakness (generalized): Secondary | ICD-10-CM | POA: Diagnosis not present

## 2021-08-02 DIAGNOSIS — M4805 Spinal stenosis, thoracolumbar region: Secondary | ICD-10-CM | POA: Diagnosis not present

## 2021-08-02 DIAGNOSIS — G20 Parkinson's disease: Secondary | ICD-10-CM | POA: Diagnosis not present

## 2021-08-03 DIAGNOSIS — J42 Unspecified chronic bronchitis: Secondary | ICD-10-CM | POA: Diagnosis not present

## 2021-08-04 ENCOUNTER — Ambulatory Visit: Payer: Medicare HMO | Admitting: Podiatry

## 2021-08-04 DIAGNOSIS — M0589 Other rheumatoid arthritis with rheumatoid factor of multiple sites: Secondary | ICD-10-CM | POA: Diagnosis not present

## 2021-08-04 DIAGNOSIS — R278 Other lack of coordination: Secondary | ICD-10-CM | POA: Diagnosis not present

## 2021-08-04 DIAGNOSIS — G20 Parkinson's disease: Secondary | ICD-10-CM | POA: Diagnosis not present

## 2021-08-04 DIAGNOSIS — M4805 Spinal stenosis, thoracolumbar region: Secondary | ICD-10-CM | POA: Diagnosis not present

## 2021-08-04 DIAGNOSIS — M6281 Muscle weakness (generalized): Secondary | ICD-10-CM | POA: Diagnosis not present

## 2021-08-04 DIAGNOSIS — R293 Abnormal posture: Secondary | ICD-10-CM | POA: Diagnosis not present

## 2021-08-09 DIAGNOSIS — R293 Abnormal posture: Secondary | ICD-10-CM | POA: Diagnosis not present

## 2021-08-09 DIAGNOSIS — R278 Other lack of coordination: Secondary | ICD-10-CM | POA: Diagnosis not present

## 2021-08-09 DIAGNOSIS — Z79899 Other long term (current) drug therapy: Secondary | ICD-10-CM | POA: Diagnosis not present

## 2021-08-09 DIAGNOSIS — M4805 Spinal stenosis, thoracolumbar region: Secondary | ICD-10-CM | POA: Diagnosis not present

## 2021-08-09 DIAGNOSIS — G20 Parkinson's disease: Secondary | ICD-10-CM | POA: Diagnosis not present

## 2021-08-09 DIAGNOSIS — M0589 Other rheumatoid arthritis with rheumatoid factor of multiple sites: Secondary | ICD-10-CM | POA: Diagnosis not present

## 2021-08-09 DIAGNOSIS — M6281 Muscle weakness (generalized): Secondary | ICD-10-CM | POA: Diagnosis not present

## 2021-08-11 DIAGNOSIS — R278 Other lack of coordination: Secondary | ICD-10-CM | POA: Diagnosis not present

## 2021-08-11 DIAGNOSIS — M4805 Spinal stenosis, thoracolumbar region: Secondary | ICD-10-CM | POA: Diagnosis not present

## 2021-08-11 DIAGNOSIS — M6281 Muscle weakness (generalized): Secondary | ICD-10-CM | POA: Diagnosis not present

## 2021-08-11 DIAGNOSIS — G20 Parkinson's disease: Secondary | ICD-10-CM | POA: Diagnosis not present

## 2021-08-11 DIAGNOSIS — R293 Abnormal posture: Secondary | ICD-10-CM | POA: Diagnosis not present

## 2021-08-11 DIAGNOSIS — M0589 Other rheumatoid arthritis with rheumatoid factor of multiple sites: Secondary | ICD-10-CM | POA: Diagnosis not present

## 2021-08-15 DIAGNOSIS — E559 Vitamin D deficiency, unspecified: Secondary | ICD-10-CM | POA: Diagnosis not present

## 2021-08-15 DIAGNOSIS — E785 Hyperlipidemia, unspecified: Secondary | ICD-10-CM | POA: Diagnosis not present

## 2021-08-15 DIAGNOSIS — E038 Other specified hypothyroidism: Secondary | ICD-10-CM | POA: Diagnosis not present

## 2021-08-15 DIAGNOSIS — M810 Age-related osteoporosis without current pathological fracture: Secondary | ICD-10-CM | POA: Diagnosis not present

## 2021-08-15 DIAGNOSIS — M0520 Rheumatoid vasculitis with rheumatoid arthritis of unspecified site: Secondary | ICD-10-CM | POA: Diagnosis not present

## 2021-08-15 DIAGNOSIS — G309 Alzheimer's disease, unspecified: Secondary | ICD-10-CM | POA: Diagnosis not present

## 2021-08-15 DIAGNOSIS — M1990 Unspecified osteoarthritis, unspecified site: Secondary | ICD-10-CM | POA: Diagnosis not present

## 2021-08-15 DIAGNOSIS — E119 Type 2 diabetes mellitus without complications: Secondary | ICD-10-CM | POA: Diagnosis not present

## 2021-08-15 DIAGNOSIS — D518 Other vitamin B12 deficiency anemias: Secondary | ICD-10-CM | POA: Diagnosis not present

## 2021-08-16 DIAGNOSIS — R278 Other lack of coordination: Secondary | ICD-10-CM | POA: Diagnosis not present

## 2021-08-16 DIAGNOSIS — R293 Abnormal posture: Secondary | ICD-10-CM | POA: Diagnosis not present

## 2021-08-16 DIAGNOSIS — M6281 Muscle weakness (generalized): Secondary | ICD-10-CM | POA: Diagnosis not present

## 2021-08-16 DIAGNOSIS — G20 Parkinson's disease: Secondary | ICD-10-CM | POA: Diagnosis not present

## 2021-08-16 DIAGNOSIS — M4805 Spinal stenosis, thoracolumbar region: Secondary | ICD-10-CM | POA: Diagnosis not present

## 2021-08-16 DIAGNOSIS — M0589 Other rheumatoid arthritis with rheumatoid factor of multiple sites: Secondary | ICD-10-CM | POA: Diagnosis not present

## 2021-08-18 DIAGNOSIS — N3281 Overactive bladder: Secondary | ICD-10-CM | POA: Diagnosis not present

## 2021-08-18 DIAGNOSIS — R69 Illness, unspecified: Secondary | ICD-10-CM | POA: Diagnosis not present

## 2021-08-18 DIAGNOSIS — E559 Vitamin D deficiency, unspecified: Secondary | ICD-10-CM | POA: Diagnosis not present

## 2021-08-18 DIAGNOSIS — M810 Age-related osteoporosis without current pathological fracture: Secondary | ICD-10-CM | POA: Diagnosis not present

## 2021-08-18 DIAGNOSIS — M48061 Spinal stenosis, lumbar region without neurogenic claudication: Secondary | ICD-10-CM | POA: Diagnosis not present

## 2021-08-18 DIAGNOSIS — G4700 Insomnia, unspecified: Secondary | ICD-10-CM | POA: Diagnosis not present

## 2021-08-18 DIAGNOSIS — E785 Hyperlipidemia, unspecified: Secondary | ICD-10-CM | POA: Diagnosis not present

## 2021-08-18 DIAGNOSIS — K5900 Constipation, unspecified: Secondary | ICD-10-CM | POA: Diagnosis not present

## 2021-08-18 DIAGNOSIS — M0520 Rheumatoid vasculitis with rheumatoid arthritis of unspecified site: Secondary | ICD-10-CM | POA: Diagnosis not present

## 2021-08-19 DIAGNOSIS — E559 Vitamin D deficiency, unspecified: Secondary | ICD-10-CM | POA: Diagnosis not present

## 2021-08-19 DIAGNOSIS — G309 Alzheimer's disease, unspecified: Secondary | ICD-10-CM | POA: Diagnosis not present

## 2021-08-19 DIAGNOSIS — E119 Type 2 diabetes mellitus without complications: Secondary | ICD-10-CM | POA: Diagnosis not present

## 2021-08-19 DIAGNOSIS — E785 Hyperlipidemia, unspecified: Secondary | ICD-10-CM | POA: Diagnosis not present

## 2021-08-19 DIAGNOSIS — M810 Age-related osteoporosis without current pathological fracture: Secondary | ICD-10-CM | POA: Diagnosis not present

## 2021-08-19 DIAGNOSIS — D518 Other vitamin B12 deficiency anemias: Secondary | ICD-10-CM | POA: Diagnosis not present

## 2021-08-19 DIAGNOSIS — M0520 Rheumatoid vasculitis with rheumatoid arthritis of unspecified site: Secondary | ICD-10-CM | POA: Diagnosis not present

## 2021-08-19 DIAGNOSIS — M1990 Unspecified osteoarthritis, unspecified site: Secondary | ICD-10-CM | POA: Diagnosis not present

## 2021-08-19 DIAGNOSIS — E038 Other specified hypothyroidism: Secondary | ICD-10-CM | POA: Diagnosis not present

## 2021-08-29 ENCOUNTER — Ambulatory Visit: Payer: Medicare HMO | Admitting: Family Medicine

## 2021-09-03 DIAGNOSIS — J42 Unspecified chronic bronchitis: Secondary | ICD-10-CM | POA: Diagnosis not present

## 2021-09-14 DIAGNOSIS — J9610 Chronic respiratory failure, unspecified whether with hypoxia or hypercapnia: Secondary | ICD-10-CM | POA: Diagnosis not present

## 2021-09-14 DIAGNOSIS — I10 Essential (primary) hypertension: Secondary | ICD-10-CM | POA: Diagnosis not present

## 2021-09-14 DIAGNOSIS — E559 Vitamin D deficiency, unspecified: Secondary | ICD-10-CM | POA: Diagnosis not present

## 2021-09-14 DIAGNOSIS — E785 Hyperlipidemia, unspecified: Secondary | ICD-10-CM | POA: Diagnosis not present

## 2021-09-15 DIAGNOSIS — E559 Vitamin D deficiency, unspecified: Secondary | ICD-10-CM | POA: Diagnosis not present

## 2021-09-15 DIAGNOSIS — K5900 Constipation, unspecified: Secondary | ICD-10-CM | POA: Diagnosis not present

## 2021-09-15 DIAGNOSIS — E785 Hyperlipidemia, unspecified: Secondary | ICD-10-CM | POA: Diagnosis not present

## 2021-09-15 DIAGNOSIS — G20 Parkinson's disease: Secondary | ICD-10-CM | POA: Diagnosis not present

## 2021-09-15 DIAGNOSIS — M48061 Spinal stenosis, lumbar region without neurogenic claudication: Secondary | ICD-10-CM | POA: Diagnosis not present

## 2021-09-15 DIAGNOSIS — G8929 Other chronic pain: Secondary | ICD-10-CM | POA: Diagnosis not present

## 2021-09-15 DIAGNOSIS — M810 Age-related osteoporosis without current pathological fracture: Secondary | ICD-10-CM | POA: Diagnosis not present

## 2021-09-15 DIAGNOSIS — R011 Cardiac murmur, unspecified: Secondary | ICD-10-CM | POA: Diagnosis not present

## 2021-09-15 DIAGNOSIS — M0520 Rheumatoid vasculitis with rheumatoid arthritis of unspecified site: Secondary | ICD-10-CM | POA: Diagnosis not present

## 2021-09-15 DIAGNOSIS — N3281 Overactive bladder: Secondary | ICD-10-CM | POA: Diagnosis not present

## 2021-09-19 DIAGNOSIS — E642 Sequelae of vitamin C deficiency: Secondary | ICD-10-CM | POA: Diagnosis not present

## 2021-09-19 DIAGNOSIS — D518 Other vitamin B12 deficiency anemias: Secondary | ICD-10-CM | POA: Diagnosis not present

## 2021-09-19 DIAGNOSIS — E038 Other specified hypothyroidism: Secondary | ICD-10-CM | POA: Diagnosis not present

## 2021-09-19 DIAGNOSIS — Z79899 Other long term (current) drug therapy: Secondary | ICD-10-CM | POA: Diagnosis not present

## 2021-09-19 DIAGNOSIS — E559 Vitamin D deficiency, unspecified: Secondary | ICD-10-CM | POA: Diagnosis not present

## 2021-09-19 DIAGNOSIS — E119 Type 2 diabetes mellitus without complications: Secondary | ICD-10-CM | POA: Diagnosis not present

## 2021-09-19 DIAGNOSIS — E7849 Other hyperlipidemia: Secondary | ICD-10-CM | POA: Diagnosis not present

## 2021-09-19 DIAGNOSIS — E569 Vitamin deficiency, unspecified: Secondary | ICD-10-CM | POA: Diagnosis not present

## 2021-09-20 DIAGNOSIS — E119 Type 2 diabetes mellitus without complications: Secondary | ICD-10-CM | POA: Diagnosis not present

## 2021-09-20 DIAGNOSIS — M810 Age-related osteoporosis without current pathological fracture: Secondary | ICD-10-CM | POA: Diagnosis not present

## 2021-09-20 DIAGNOSIS — M0520 Rheumatoid vasculitis with rheumatoid arthritis of unspecified site: Secondary | ICD-10-CM | POA: Diagnosis not present

## 2021-09-20 DIAGNOSIS — G309 Alzheimer's disease, unspecified: Secondary | ICD-10-CM | POA: Diagnosis not present

## 2021-09-20 DIAGNOSIS — E559 Vitamin D deficiency, unspecified: Secondary | ICD-10-CM | POA: Diagnosis not present

## 2021-09-20 DIAGNOSIS — D518 Other vitamin B12 deficiency anemias: Secondary | ICD-10-CM | POA: Diagnosis not present

## 2021-09-20 DIAGNOSIS — E785 Hyperlipidemia, unspecified: Secondary | ICD-10-CM | POA: Diagnosis not present

## 2021-09-20 DIAGNOSIS — M1990 Unspecified osteoarthritis, unspecified site: Secondary | ICD-10-CM | POA: Diagnosis not present

## 2021-09-20 DIAGNOSIS — E038 Other specified hypothyroidism: Secondary | ICD-10-CM | POA: Diagnosis not present

## 2021-09-22 DIAGNOSIS — Z79899 Other long term (current) drug therapy: Secondary | ICD-10-CM | POA: Diagnosis not present

## 2021-09-26 DIAGNOSIS — E038 Other specified hypothyroidism: Secondary | ICD-10-CM | POA: Diagnosis not present

## 2021-09-26 DIAGNOSIS — E569 Vitamin deficiency, unspecified: Secondary | ICD-10-CM | POA: Diagnosis not present

## 2021-09-26 DIAGNOSIS — D518 Other vitamin B12 deficiency anemias: Secondary | ICD-10-CM | POA: Diagnosis not present

## 2021-09-26 DIAGNOSIS — Z79899 Other long term (current) drug therapy: Secondary | ICD-10-CM | POA: Diagnosis not present

## 2021-09-26 DIAGNOSIS — E559 Vitamin D deficiency, unspecified: Secondary | ICD-10-CM | POA: Diagnosis not present

## 2021-09-26 DIAGNOSIS — E119 Type 2 diabetes mellitus without complications: Secondary | ICD-10-CM | POA: Diagnosis not present

## 2021-09-26 DIAGNOSIS — E7849 Other hyperlipidemia: Secondary | ICD-10-CM | POA: Diagnosis not present

## 2021-09-26 DIAGNOSIS — E642 Sequelae of vitamin C deficiency: Secondary | ICD-10-CM | POA: Diagnosis not present

## 2021-09-28 ENCOUNTER — Ambulatory Visit: Payer: Medicare HMO | Admitting: Family Medicine

## 2021-10-03 DIAGNOSIS — J42 Unspecified chronic bronchitis: Secondary | ICD-10-CM | POA: Diagnosis not present

## 2021-10-05 ENCOUNTER — Telehealth: Payer: Self-pay

## 2021-10-05 NOTE — Telephone Encounter (Signed)
I have advised Vaughan Basta that we do not give this to patient, Dr Jefm Bryant does.  Vaughan Basta verbalized she will contact their office   Copied from [place] [phone]. Topic: General - Other >> Oct 05, 2021  9:29 AM Tessa Lerner A wrote: Reason for CRM: The patient recently moved to [facility] assisted living facility in June and the staff have concerns with patient's denosumab ([place]) 60 MG/ML SOSY injection [[id number]]    Vaughan Basta with [facility] would like to know the date of patient's last injection and also be made aware of when the patient can be scheduled to come again   Please contact further

## 2021-10-08 DIAGNOSIS — E785 Hyperlipidemia, unspecified: Secondary | ICD-10-CM | POA: Diagnosis not present

## 2021-10-08 DIAGNOSIS — J9610 Chronic respiratory failure, unspecified whether with hypoxia or hypercapnia: Secondary | ICD-10-CM | POA: Diagnosis not present

## 2021-10-08 DIAGNOSIS — E559 Vitamin D deficiency, unspecified: Secondary | ICD-10-CM | POA: Diagnosis not present

## 2021-10-08 DIAGNOSIS — I10 Essential (primary) hypertension: Secondary | ICD-10-CM | POA: Diagnosis not present

## 2021-10-12 DIAGNOSIS — I10 Essential (primary) hypertension: Secondary | ICD-10-CM | POA: Diagnosis not present

## 2021-10-13 DIAGNOSIS — M1990 Unspecified osteoarthritis, unspecified site: Secondary | ICD-10-CM | POA: Diagnosis not present

## 2021-10-13 DIAGNOSIS — M48061 Spinal stenosis, lumbar region without neurogenic claudication: Secondary | ICD-10-CM | POA: Diagnosis not present

## 2021-10-13 DIAGNOSIS — N3281 Overactive bladder: Secondary | ICD-10-CM | POA: Diagnosis not present

## 2021-10-13 DIAGNOSIS — E559 Vitamin D deficiency, unspecified: Secondary | ICD-10-CM | POA: Diagnosis not present

## 2021-10-13 DIAGNOSIS — M810 Age-related osteoporosis without current pathological fracture: Secondary | ICD-10-CM | POA: Diagnosis not present

## 2021-10-13 DIAGNOSIS — R600 Localized edema: Secondary | ICD-10-CM | POA: Diagnosis not present

## 2021-10-13 DIAGNOSIS — E785 Hyperlipidemia, unspecified: Secondary | ICD-10-CM | POA: Diagnosis not present

## 2021-10-13 DIAGNOSIS — G8929 Other chronic pain: Secondary | ICD-10-CM | POA: Diagnosis not present

## 2021-10-13 DIAGNOSIS — G4700 Insomnia, unspecified: Secondary | ICD-10-CM | POA: Diagnosis not present

## 2021-10-13 DIAGNOSIS — M0520 Rheumatoid vasculitis with rheumatoid arthritis of unspecified site: Secondary | ICD-10-CM | POA: Diagnosis not present

## 2021-10-19 DIAGNOSIS — E038 Other specified hypothyroidism: Secondary | ICD-10-CM | POA: Diagnosis not present

## 2021-10-19 DIAGNOSIS — M1990 Unspecified osteoarthritis, unspecified site: Secondary | ICD-10-CM | POA: Diagnosis not present

## 2021-10-19 DIAGNOSIS — E559 Vitamin D deficiency, unspecified: Secondary | ICD-10-CM | POA: Diagnosis not present

## 2021-10-19 DIAGNOSIS — M810 Age-related osteoporosis without current pathological fracture: Secondary | ICD-10-CM | POA: Diagnosis not present

## 2021-10-19 DIAGNOSIS — E119 Type 2 diabetes mellitus without complications: Secondary | ICD-10-CM | POA: Diagnosis not present

## 2021-10-19 DIAGNOSIS — M0520 Rheumatoid vasculitis with rheumatoid arthritis of unspecified site: Secondary | ICD-10-CM | POA: Diagnosis not present

## 2021-10-19 DIAGNOSIS — G309 Alzheimer's disease, unspecified: Secondary | ICD-10-CM | POA: Diagnosis not present

## 2021-10-19 DIAGNOSIS — D518 Other vitamin B12 deficiency anemias: Secondary | ICD-10-CM | POA: Diagnosis not present

## 2021-10-19 DIAGNOSIS — E785 Hyperlipidemia, unspecified: Secondary | ICD-10-CM | POA: Diagnosis not present

## 2021-10-22 DIAGNOSIS — E559 Vitamin D deficiency, unspecified: Secondary | ICD-10-CM | POA: Diagnosis not present

## 2021-10-22 DIAGNOSIS — E785 Hyperlipidemia, unspecified: Secondary | ICD-10-CM | POA: Diagnosis not present

## 2021-10-22 DIAGNOSIS — J9610 Chronic respiratory failure, unspecified whether with hypoxia or hypercapnia: Secondary | ICD-10-CM | POA: Diagnosis not present

## 2021-10-22 DIAGNOSIS — I10 Essential (primary) hypertension: Secondary | ICD-10-CM | POA: Diagnosis not present

## 2021-11-03 DIAGNOSIS — J42 Unspecified chronic bronchitis: Secondary | ICD-10-CM | POA: Diagnosis not present

## 2021-11-09 DIAGNOSIS — R600 Localized edema: Secondary | ICD-10-CM | POA: Diagnosis not present

## 2021-11-09 DIAGNOSIS — M1990 Unspecified osteoarthritis, unspecified site: Secondary | ICD-10-CM | POA: Diagnosis not present

## 2021-11-09 DIAGNOSIS — M0520 Rheumatoid vasculitis with rheumatoid arthritis of unspecified site: Secondary | ICD-10-CM | POA: Diagnosis not present

## 2021-11-09 DIAGNOSIS — G8929 Other chronic pain: Secondary | ICD-10-CM | POA: Diagnosis not present

## 2021-11-09 DIAGNOSIS — G4700 Insomnia, unspecified: Secondary | ICD-10-CM | POA: Diagnosis not present

## 2021-11-09 DIAGNOSIS — N950 Postmenopausal bleeding: Secondary | ICD-10-CM | POA: Diagnosis not present

## 2021-11-09 DIAGNOSIS — M810 Age-related osteoporosis without current pathological fracture: Secondary | ICD-10-CM | POA: Diagnosis not present

## 2021-11-09 DIAGNOSIS — M48061 Spinal stenosis, lumbar region without neurogenic claudication: Secondary | ICD-10-CM | POA: Diagnosis not present

## 2021-11-09 DIAGNOSIS — J9610 Chronic respiratory failure, unspecified whether with hypoxia or hypercapnia: Secondary | ICD-10-CM | POA: Diagnosis not present

## 2021-11-14 DIAGNOSIS — I10 Essential (primary) hypertension: Secondary | ICD-10-CM | POA: Diagnosis not present

## 2021-11-19 DIAGNOSIS — E559 Vitamin D deficiency, unspecified: Secondary | ICD-10-CM | POA: Diagnosis not present

## 2021-11-19 DIAGNOSIS — J9610 Chronic respiratory failure, unspecified whether with hypoxia or hypercapnia: Secondary | ICD-10-CM | POA: Diagnosis not present

## 2021-11-19 DIAGNOSIS — I10 Essential (primary) hypertension: Secondary | ICD-10-CM | POA: Diagnosis not present

## 2021-11-19 DIAGNOSIS — E785 Hyperlipidemia, unspecified: Secondary | ICD-10-CM | POA: Diagnosis not present

## 2021-11-23 DIAGNOSIS — E559 Vitamin D deficiency, unspecified: Secondary | ICD-10-CM | POA: Diagnosis not present

## 2021-11-23 DIAGNOSIS — D518 Other vitamin B12 deficiency anemias: Secondary | ICD-10-CM | POA: Diagnosis not present

## 2021-11-23 DIAGNOSIS — E119 Type 2 diabetes mellitus without complications: Secondary | ICD-10-CM | POA: Diagnosis not present

## 2021-11-23 DIAGNOSIS — M1990 Unspecified osteoarthritis, unspecified site: Secondary | ICD-10-CM | POA: Diagnosis not present

## 2021-11-23 DIAGNOSIS — I10 Essential (primary) hypertension: Secondary | ICD-10-CM | POA: Diagnosis not present

## 2021-11-23 DIAGNOSIS — E785 Hyperlipidemia, unspecified: Secondary | ICD-10-CM | POA: Diagnosis not present

## 2021-11-23 DIAGNOSIS — E038 Other specified hypothyroidism: Secondary | ICD-10-CM | POA: Diagnosis not present

## 2021-11-23 DIAGNOSIS — G309 Alzheimer's disease, unspecified: Secondary | ICD-10-CM | POA: Diagnosis not present

## 2021-11-23 DIAGNOSIS — M810 Age-related osteoporosis without current pathological fracture: Secondary | ICD-10-CM | POA: Diagnosis not present

## 2021-11-23 DIAGNOSIS — M0520 Rheumatoid vasculitis with rheumatoid arthritis of unspecified site: Secondary | ICD-10-CM | POA: Diagnosis not present

## 2021-11-24 ENCOUNTER — Ambulatory Visit: Payer: Self-pay | Admitting: Family Medicine

## 2021-12-03 DIAGNOSIS — J42 Unspecified chronic bronchitis: Secondary | ICD-10-CM | POA: Diagnosis not present

## 2021-12-08 DIAGNOSIS — N3281 Overactive bladder: Secondary | ICD-10-CM | POA: Diagnosis not present

## 2021-12-08 DIAGNOSIS — R600 Localized edema: Secondary | ICD-10-CM | POA: Diagnosis not present

## 2021-12-08 DIAGNOSIS — G4700 Insomnia, unspecified: Secondary | ICD-10-CM | POA: Diagnosis not present

## 2021-12-08 DIAGNOSIS — R5381 Other malaise: Secondary | ICD-10-CM | POA: Diagnosis not present

## 2021-12-08 DIAGNOSIS — N950 Postmenopausal bleeding: Secondary | ICD-10-CM | POA: Diagnosis not present

## 2021-12-08 DIAGNOSIS — E559 Vitamin D deficiency, unspecified: Secondary | ICD-10-CM | POA: Diagnosis not present

## 2021-12-08 DIAGNOSIS — E785 Hyperlipidemia, unspecified: Secondary | ICD-10-CM | POA: Diagnosis not present

## 2021-12-08 DIAGNOSIS — I10 Essential (primary) hypertension: Secondary | ICD-10-CM | POA: Diagnosis not present

## 2021-12-08 DIAGNOSIS — M1990 Unspecified osteoarthritis, unspecified site: Secondary | ICD-10-CM | POA: Diagnosis not present

## 2021-12-08 DIAGNOSIS — G20 Parkinson's disease: Secondary | ICD-10-CM | POA: Diagnosis not present

## 2021-12-13 DIAGNOSIS — I10 Essential (primary) hypertension: Secondary | ICD-10-CM | POA: Diagnosis not present

## 2021-12-27 DIAGNOSIS — M79675 Pain in left toe(s): Secondary | ICD-10-CM | POA: Diagnosis not present

## 2021-12-27 DIAGNOSIS — M2041 Other hammer toe(s) (acquired), right foot: Secondary | ICD-10-CM | POA: Diagnosis not present

## 2021-12-27 DIAGNOSIS — L603 Nail dystrophy: Secondary | ICD-10-CM | POA: Diagnosis not present

## 2021-12-27 DIAGNOSIS — M79674 Pain in right toe(s): Secondary | ICD-10-CM | POA: Diagnosis not present

## 2021-12-27 DIAGNOSIS — B351 Tinea unguium: Secondary | ICD-10-CM | POA: Diagnosis not present

## 2021-12-27 DIAGNOSIS — M2042 Other hammer toe(s) (acquired), left foot: Secondary | ICD-10-CM | POA: Diagnosis not present

## 2022-01-03 DIAGNOSIS — J42 Unspecified chronic bronchitis: Secondary | ICD-10-CM | POA: Diagnosis not present

## 2022-01-04 DIAGNOSIS — E038 Other specified hypothyroidism: Secondary | ICD-10-CM | POA: Diagnosis not present

## 2022-01-04 DIAGNOSIS — I10 Essential (primary) hypertension: Secondary | ICD-10-CM | POA: Diagnosis not present

## 2022-01-04 DIAGNOSIS — M0520 Rheumatoid vasculitis with rheumatoid arthritis of unspecified site: Secondary | ICD-10-CM | POA: Diagnosis not present

## 2022-01-04 DIAGNOSIS — E119 Type 2 diabetes mellitus without complications: Secondary | ICD-10-CM | POA: Diagnosis not present

## 2022-01-04 DIAGNOSIS — E785 Hyperlipidemia, unspecified: Secondary | ICD-10-CM | POA: Diagnosis not present

## 2022-01-04 DIAGNOSIS — E559 Vitamin D deficiency, unspecified: Secondary | ICD-10-CM | POA: Diagnosis not present

## 2022-01-04 DIAGNOSIS — G309 Alzheimer's disease, unspecified: Secondary | ICD-10-CM | POA: Diagnosis not present

## 2022-01-04 DIAGNOSIS — M1990 Unspecified osteoarthritis, unspecified site: Secondary | ICD-10-CM | POA: Diagnosis not present

## 2022-01-04 DIAGNOSIS — D518 Other vitamin B12 deficiency anemias: Secondary | ICD-10-CM | POA: Diagnosis not present

## 2022-01-04 DIAGNOSIS — M810 Age-related osteoporosis without current pathological fracture: Secondary | ICD-10-CM | POA: Diagnosis not present

## 2022-01-11 DIAGNOSIS — I10 Essential (primary) hypertension: Secondary | ICD-10-CM | POA: Diagnosis not present

## 2022-01-12 DIAGNOSIS — G309 Alzheimer's disease, unspecified: Secondary | ICD-10-CM | POA: Diagnosis not present

## 2022-01-12 DIAGNOSIS — G20 Parkinson's disease: Secondary | ICD-10-CM | POA: Diagnosis not present

## 2022-01-12 DIAGNOSIS — R69 Illness, unspecified: Secondary | ICD-10-CM | POA: Diagnosis not present

## 2022-01-12 DIAGNOSIS — N3281 Overactive bladder: Secondary | ICD-10-CM | POA: Diagnosis not present

## 2022-01-12 DIAGNOSIS — G4700 Insomnia, unspecified: Secondary | ICD-10-CM | POA: Diagnosis not present

## 2022-01-12 DIAGNOSIS — M0520 Rheumatoid vasculitis with rheumatoid arthritis of unspecified site: Secondary | ICD-10-CM | POA: Diagnosis not present

## 2022-01-12 DIAGNOSIS — E559 Vitamin D deficiency, unspecified: Secondary | ICD-10-CM | POA: Diagnosis not present

## 2022-01-12 DIAGNOSIS — E785 Hyperlipidemia, unspecified: Secondary | ICD-10-CM | POA: Diagnosis not present

## 2022-01-12 DIAGNOSIS — R5381 Other malaise: Secondary | ICD-10-CM | POA: Diagnosis not present

## 2022-01-12 DIAGNOSIS — I10 Essential (primary) hypertension: Secondary | ICD-10-CM | POA: Diagnosis not present

## 2022-01-18 DIAGNOSIS — M0520 Rheumatoid vasculitis with rheumatoid arthritis of unspecified site: Secondary | ICD-10-CM | POA: Diagnosis not present

## 2022-01-18 DIAGNOSIS — G309 Alzheimer's disease, unspecified: Secondary | ICD-10-CM | POA: Diagnosis not present

## 2022-01-18 DIAGNOSIS — E785 Hyperlipidemia, unspecified: Secondary | ICD-10-CM | POA: Diagnosis not present

## 2022-01-18 DIAGNOSIS — D518 Other vitamin B12 deficiency anemias: Secondary | ICD-10-CM | POA: Diagnosis not present

## 2022-01-18 DIAGNOSIS — E559 Vitamin D deficiency, unspecified: Secondary | ICD-10-CM | POA: Diagnosis not present

## 2022-01-18 DIAGNOSIS — E119 Type 2 diabetes mellitus without complications: Secondary | ICD-10-CM | POA: Diagnosis not present

## 2022-01-18 DIAGNOSIS — M1990 Unspecified osteoarthritis, unspecified site: Secondary | ICD-10-CM | POA: Diagnosis not present

## 2022-01-18 DIAGNOSIS — M810 Age-related osteoporosis without current pathological fracture: Secondary | ICD-10-CM | POA: Diagnosis not present

## 2022-01-18 DIAGNOSIS — I10 Essential (primary) hypertension: Secondary | ICD-10-CM | POA: Diagnosis not present

## 2022-01-18 DIAGNOSIS — E038 Other specified hypothyroidism: Secondary | ICD-10-CM | POA: Diagnosis not present

## 2022-02-01 DIAGNOSIS — E119 Type 2 diabetes mellitus without complications: Secondary | ICD-10-CM | POA: Diagnosis not present

## 2022-02-01 DIAGNOSIS — E7849 Other hyperlipidemia: Secondary | ICD-10-CM | POA: Diagnosis not present

## 2022-02-01 DIAGNOSIS — Z79899 Other long term (current) drug therapy: Secondary | ICD-10-CM | POA: Diagnosis not present

## 2022-02-01 DIAGNOSIS — E559 Vitamin D deficiency, unspecified: Secondary | ICD-10-CM | POA: Diagnosis not present

## 2022-02-01 DIAGNOSIS — E038 Other specified hypothyroidism: Secondary | ICD-10-CM | POA: Diagnosis not present

## 2022-02-01 DIAGNOSIS — D518 Other vitamin B12 deficiency anemias: Secondary | ICD-10-CM | POA: Diagnosis not present

## 2022-02-03 DIAGNOSIS — J42 Unspecified chronic bronchitis: Secondary | ICD-10-CM | POA: Diagnosis not present

## 2022-02-09 DIAGNOSIS — M48061 Spinal stenosis, lumbar region without neurogenic claudication: Secondary | ICD-10-CM | POA: Diagnosis not present

## 2022-02-09 DIAGNOSIS — R600 Localized edema: Secondary | ICD-10-CM | POA: Diagnosis not present

## 2022-02-09 DIAGNOSIS — N3281 Overactive bladder: Secondary | ICD-10-CM | POA: Diagnosis not present

## 2022-02-09 DIAGNOSIS — K5900 Constipation, unspecified: Secondary | ICD-10-CM | POA: Diagnosis not present

## 2022-02-09 DIAGNOSIS — I10 Essential (primary) hypertension: Secondary | ICD-10-CM | POA: Diagnosis not present

## 2022-02-09 DIAGNOSIS — R5381 Other malaise: Secondary | ICD-10-CM | POA: Diagnosis not present

## 2022-02-09 DIAGNOSIS — G4700 Insomnia, unspecified: Secondary | ICD-10-CM | POA: Diagnosis not present

## 2022-02-09 DIAGNOSIS — R011 Cardiac murmur, unspecified: Secondary | ICD-10-CM | POA: Diagnosis not present

## 2022-02-09 DIAGNOSIS — M0520 Rheumatoid vasculitis with rheumatoid arthritis of unspecified site: Secondary | ICD-10-CM | POA: Diagnosis not present

## 2022-02-09 DIAGNOSIS — R69 Illness, unspecified: Secondary | ICD-10-CM | POA: Diagnosis not present

## 2022-02-11 DIAGNOSIS — I10 Essential (primary) hypertension: Secondary | ICD-10-CM | POA: Diagnosis not present

## 2022-02-15 DIAGNOSIS — I10 Essential (primary) hypertension: Secondary | ICD-10-CM | POA: Diagnosis not present

## 2022-02-15 DIAGNOSIS — M1990 Unspecified osteoarthritis, unspecified site: Secondary | ICD-10-CM | POA: Diagnosis not present

## 2022-02-15 DIAGNOSIS — G309 Alzheimer's disease, unspecified: Secondary | ICD-10-CM | POA: Diagnosis not present

## 2022-02-15 DIAGNOSIS — M0520 Rheumatoid vasculitis with rheumatoid arthritis of unspecified site: Secondary | ICD-10-CM | POA: Diagnosis not present

## 2022-02-15 DIAGNOSIS — E559 Vitamin D deficiency, unspecified: Secondary | ICD-10-CM | POA: Diagnosis not present

## 2022-02-15 DIAGNOSIS — E119 Type 2 diabetes mellitus without complications: Secondary | ICD-10-CM | POA: Diagnosis not present

## 2022-02-15 DIAGNOSIS — E785 Hyperlipidemia, unspecified: Secondary | ICD-10-CM | POA: Diagnosis not present

## 2022-02-15 DIAGNOSIS — M810 Age-related osteoporosis without current pathological fracture: Secondary | ICD-10-CM | POA: Diagnosis not present

## 2022-02-15 DIAGNOSIS — D518 Other vitamin B12 deficiency anemias: Secondary | ICD-10-CM | POA: Diagnosis not present

## 2022-02-15 DIAGNOSIS — E038 Other specified hypothyroidism: Secondary | ICD-10-CM | POA: Diagnosis not present

## 2022-02-16 DIAGNOSIS — R011 Cardiac murmur, unspecified: Secondary | ICD-10-CM | POA: Diagnosis not present

## 2022-02-27 ENCOUNTER — Telehealth: Payer: Self-pay | Admitting: Family Medicine

## 2022-02-27 NOTE — Telephone Encounter (Signed)
Copied from [place] [phone]. Topic: Medicare AWV ?>> Feb 27, 2022  9:16 AM Cher Nakai R wrote: ?No answer unable to leave a  message for patient to call back and schedule Medicare Annual Wellness Visit (AWV) in office.  ? ?If not able to come in office, please offer to do virtually or by telephone.  ? ?Last AWV:  08/24/2020 ? ?Please schedule at anytime with [facility] Health Advisor. ? ?If any questions, please contact me at [phone] ?

## 2022-03-03 DIAGNOSIS — J42 Unspecified chronic bronchitis: Secondary | ICD-10-CM | POA: Diagnosis not present

## 2022-03-08 ENCOUNTER — Emergency Department
Admission: EM | Admit: 2022-03-08 | Discharge: 2022-03-08 | Disposition: A | Discharge status: Home / Self Care | Payer: Medicare HMO | Attending: Emergency Medicine | Admitting: Emergency Medicine

## 2022-03-08 ENCOUNTER — Other Ambulatory Visit: Payer: Self-pay

## 2022-03-08 ENCOUNTER — Emergency Department: Payer: Medicare HMO

## 2022-03-08 ENCOUNTER — Encounter: Payer: Self-pay | Admitting: Emergency Medicine

## 2022-03-08 DIAGNOSIS — R5381 Other malaise: Secondary | ICD-10-CM | POA: Diagnosis not present

## 2022-03-08 DIAGNOSIS — Z043 Encounter for examination and observation following other accident: Secondary | ICD-10-CM | POA: Diagnosis not present

## 2022-03-08 DIAGNOSIS — F0390 Unspecified dementia without behavioral disturbance: Secondary | ICD-10-CM | POA: Diagnosis not present

## 2022-03-08 DIAGNOSIS — B351 Tinea unguium: Secondary | ICD-10-CM | POA: Diagnosis not present

## 2022-03-08 DIAGNOSIS — R9431 Abnormal electrocardiogram [ECG] [EKG]: Secondary | ICD-10-CM

## 2022-03-08 DIAGNOSIS — R9401 Abnormal electroencephalogram [EEG]: Secondary | ICD-10-CM | POA: Diagnosis not present

## 2022-03-08 DIAGNOSIS — S0003XA Contusion of scalp, initial encounter: Secondary | ICD-10-CM | POA: Insufficient documentation

## 2022-03-08 DIAGNOSIS — S0990XA Unspecified injury of head, initial encounter: Secondary | ICD-10-CM | POA: Diagnosis not present

## 2022-03-08 DIAGNOSIS — W19XXXA Unspecified fall, initial encounter: Secondary | ICD-10-CM | POA: Insufficient documentation

## 2022-03-08 DIAGNOSIS — L600 Ingrowing nail: Secondary | ICD-10-CM | POA: Diagnosis not present

## 2022-03-08 DIAGNOSIS — Z743 Need for continuous supervision: Secondary | ICD-10-CM | POA: Diagnosis not present

## 2022-03-08 DIAGNOSIS — R531 Weakness: Secondary | ICD-10-CM | POA: Diagnosis not present

## 2022-03-08 DIAGNOSIS — M79672 Pain in left foot: Secondary | ICD-10-CM | POA: Diagnosis not present

## 2022-03-08 DIAGNOSIS — M1992 Post-traumatic osteoarthritis, unspecified site: Secondary | ICD-10-CM | POA: Diagnosis not present

## 2022-03-08 DIAGNOSIS — M47812 Spondylosis without myelopathy or radiculopathy, cervical region: Secondary | ICD-10-CM | POA: Diagnosis not present

## 2022-03-08 DIAGNOSIS — G20 Parkinson's disease: Secondary | ICD-10-CM | POA: Insufficient documentation

## 2022-03-08 DIAGNOSIS — R69 Illness, unspecified: Secondary | ICD-10-CM | POA: Diagnosis not present

## 2022-03-08 DIAGNOSIS — M79671 Pain in right foot: Secondary | ICD-10-CM | POA: Diagnosis not present

## 2022-03-08 LAB — CBC WITH DIFFERENTIAL/PLATELET
Abs Immature Granulocytes: 0.03 10*3/uL (ref 0.00–0.07)
Basophils Absolute: 0.1 10*3/uL (ref 0.0–0.1)
Basophils Relative: 1 %
Eosinophils Absolute: 0.3 10*3/uL (ref 0.0–0.5)
Eosinophils Relative: 4 %
HCT: 46.1 % — ABNORMAL HIGH (ref 36.0–46.0)
Hemoglobin: 14.8 g/dL (ref 12.0–15.0)
Immature Granulocytes: 0 %
Lymphocytes Relative: 24 %
Lymphs Abs: 1.8 10*3/uL (ref 0.7–4.0)
MCH: 32.1 pg (ref 26.0–34.0)
MCHC: 32.1 g/dL (ref 30.0–36.0)
MCV: 100 fL (ref 80.0–100.0)
Monocytes Absolute: 0.9 10*3/uL (ref 0.1–1.0)
Monocytes Relative: 12 %
Neutro Abs: 4.4 10*3/uL (ref 1.7–7.7)
Neutrophils Relative %: 59 %
Platelets: 180 10*3/uL (ref 150–400)
RBC: 4.61 MIL/uL (ref 3.87–5.11)
RDW: 13.5 % (ref 11.5–15.5)
WBC: 7.6 10*3/uL (ref 4.0–10.5)
nRBC: 0 % (ref 0.0–0.2)

## 2022-03-08 LAB — BASIC METABOLIC PANEL
Anion gap: 6 (ref 5–15)
BUN: 15 mg/dL (ref 8–23)
CO2: 32 mmol/L (ref 22–32)
Calcium: 8.9 mg/dL (ref 8.9–10.3)
Chloride: 96 mmol/L — ABNORMAL LOW (ref 98–111)
Creatinine, Ser: 0.92 mg/dL (ref 0.44–1.00)
GFR, Estimated: >60 mL/min (ref >60)
Glucose, Bld: 126 mg/dL — ABNORMAL HIGH (ref 70–99)
Potassium: 3.7 mmol/L (ref 3.5–5.1)
Sodium: 134 mmol/L — ABNORMAL LOW (ref 135–145)

## 2022-03-08 LAB — MAGNESIUM: Magnesium: 1.9 mg/dL (ref 1.7–2.4)

## 2022-03-08 NOTE — ED Triage Notes (Signed)
Patient to ED via [facility] from [facility] for a unwitnessed fall. Patient unsure how how she fell or if she loss consciousness. Hx of dementia. Bump on head noted. Patient only complaining on leg pain which is chronic per facility. Patient Aox2 at this time.  ?

## 2022-03-08 NOTE — ED Notes (Signed)
Patient to CT at this time

## 2022-03-08 NOTE — ED Provider Notes (Signed)
? ?[facility] ?Provider Note ? ? ? Event Date/Time  ? First MD Initiated Contact with Patient 03/08/22 [phone]   ?  (approximate) ? ? ?History  ? ?Chief Complaint ?Fall ? ? ?HPI ? ?Morgan Dennis is a 88 y.o. female with past medical history of Parkinson disease, hyperlipidemia, rheumatoid arthritis, dementia who presents to the ED following fall.  History is limited due to patient's dementia and majority of history is obtained from EMS.  They state that patient fell sometime during the night at her assisted living facility.  Patient states she remembers falling but is not sure what caused her to fall, admits hitting her head but is unsure whether she lost consciousness.  She does not take any blood thinners per EMS.  She currently complains of burning pain around her forehead along with pain in both of her lower legs, which is chronic per EMS.  Patient denies any neck pain, chest pain, upper extremity pain, or abdominal pain.  Patient reported to be at her baseline mental status per staff at living facility. ?  ? ? ?Physical Exam  ? ?Triage Vital Signs: ?ED Triage Vitals  ?Enc Vitals Group  ?   BP 03/08/22 0816 134/81  ?   Pulse Rate 03/08/22 0816 86  ?   Resp 03/08/22 0816 18  ?   Temp 03/08/22 0816 97.9 ?F (36.6 ?C)  ?   Temp Source 03/08/22 0816 Oral  ?   SpO2 03/08/22 0812 95 %  ?   Weight --   ?   Height --   ?   Head Circumference --   ?   Peak Flow --   ?   Pain Score 03/08/22 0815 5  ?   Pain Loc --   ?   Pain Edu? --   ?   Excl. in [place]? --   ? ? ?Most recent vital signs: ?Vitals:  ? 03/08/22 0900 03/08/22 0930  ?BP: (!) 154/78 (!) 144/82  ?Pulse: 93 83  ?Resp: 14 (!) 21  ?Temp:    ?SpO2: 91% 90%  ? ? ?Constitutional: Alert and oriented to person and place, but not time or situation. ?Eyes: Conjunctivae are normal. ?Head: Small hematoma to frontal scalp with no associated laceration. ?Nose: No congestion/rhinnorhea. ?Mouth/Throat: Mucous membranes are moist.  ?Neck: No midline cervical  spine tenderness to palpation. ?Cardiovascular: Normal rate, regular rhythm. Grossly normal heart sounds.  2+ radial pulses bilaterally. ?Respiratory: Normal respiratory effort.  No retractions. Lungs CTAB. ?Gastrointestinal: Soft and nontender. No distention. ?Musculoskeletal: No lower extremity tenderness nor edema.  No upper extremity bony tenderness to palpation. ?Neurologic:  Normal speech and language. No gross focal neurologic deficits are appreciated. ? ? ? ?ED Results / Procedures / Treatments  ? ?Labs ?(all labs ordered are listed, but only abnormal results are displayed) ?Labs Reviewed  ?CBC WITH DIFFERENTIAL/PLATELET - Abnormal; Notable for the following components:  ?    Result Value  ? HCT 46.1 (*)   ? All other components within normal limits  ?BASIC METABOLIC PANEL - Abnormal; Notable for the following components:  ? Sodium 134 (*)   ? Chloride 96 (*)   ? Glucose, Bld 126 (*)   ? All other components within normal limits  ?MAGNESIUM  ? ? ? ?EKG ? ?ED ECG REPORT ?Tempie Hoist, the attending physician, personally viewed and interpreted this ECG. ? ? Date: 03/08/2022 ? EKG Time: 8:20 ? Rate: 87 ? Rhythm: normal sinus rhythm ? Axis: Normal ?  Intervals: Prolonged QT ? ST&T Change: None ? ?RADIOLOGY ?CT head reviewed by me with no obvious hemorrhage or midline shift.  CT cervical spine reviewed by me with no obvious fracture or dislocation. ? ?PROCEDURES: ? ?Critical Care performed: No ? ?.1-3 Lead EKG Interpretation ?Performed by: Blake Divine, MD ?Authorized by: Blake Divine, MD  ? ?  Interpretation: normal   ?  ECG rate:  65-80 ?  ECG rate assessment: normal   ?  Rhythm: sinus rhythm   ?  Ectopy: none   ?  Conduction: normal   ? ? ?MEDICATIONS ORDERED IN ED: ?Medications - No data to display ? ? ?IMPRESSION / MDM / ASSESSMENT AND PLAN / ED COURSE  ?I reviewed the triage vital signs and the nursing notes. ?             ?               ? ?88 y.o. female with past medical history of Parkinson's  disease, hyperlipidemia, rheumatoid arthritis, and dementia who presents to the ED following fall last night at her assisted living facility with unclear context. ? ?Differential diagnosis includes, but is not limited to, intracranial injury, cervical spine injury, syncope, anemia, dehydration, stroke. ? ?Patient nontoxic-appearing and in no acute distress, vital signs are unremarkable and she appears at her baseline mental status with no focal neurologic deficits.  Given her advanced age and unclear context of fall, we will check CT head and cervical spine.  She complains of pain to both of her lower legs however this is a chronic complaint for her and there are no signs of trauma to her legs.  Range of motion is intact to her lower extremities with no bony tenderness, no evidence of traumatic injury to her trunk or upper extremities as well.  We will screen EKG and basic labs for any contributing factors to her fall. ? ?The patient is on the cardiac monitor to evaluate for evidence of arrhythmia and/or significant heart rate changes. ? ?EKG shows prolonged QT interval that is new from previous but no other signs of arrhythmia or ischemia.  Labs are unremarkable with CBC showing no anemia or leukocytosis, no electrolyte abnormality noted on BMP and magnesium is within normal limits.  While prolonged QT could have contributed to syncopal episode and fall, this seems less likely than mechanical fall given patient's Parkinson disease and cognitive impairment.  Son was offered referral to cardiology for further evaluation but is not particularly interested in any intervention at this time.  He will have patient follow-up with provider at her nursing facility and was counseled to have her return to the ED for new or worsening symptoms.  Patient and son agree with plan. ? ?  ? ? ?FINAL CLINICAL IMPRESSION(S) / ED DIAGNOSES  ? ?Final diagnoses:  ?Fall, initial encounter  ?Prolonged Q-T interval on ECG  ? ? ? ?Rx / DC  Orders  ? ?ED Discharge Orders   ? ? None  ? ?  ? ? ? ?Note:  This document was prepared using Dragon voice recognition software and may include unintentional dictation errors. ?  ?Blake Divine, MD ?03/08/22 1011 ? ?

## 2022-03-11 DIAGNOSIS — I10 Essential (primary) hypertension: Secondary | ICD-10-CM | POA: Diagnosis not present

## 2022-03-15 DIAGNOSIS — D518 Other vitamin B12 deficiency anemias: Secondary | ICD-10-CM | POA: Diagnosis not present

## 2022-03-15 DIAGNOSIS — E119 Type 2 diabetes mellitus without complications: Secondary | ICD-10-CM | POA: Diagnosis not present

## 2022-03-15 DIAGNOSIS — E7849 Other hyperlipidemia: Secondary | ICD-10-CM | POA: Diagnosis not present

## 2022-03-15 DIAGNOSIS — Z79899 Other long term (current) drug therapy: Secondary | ICD-10-CM | POA: Diagnosis not present

## 2022-03-16 DIAGNOSIS — I350 Nonrheumatic aortic (valve) stenosis: Secondary | ICD-10-CM | POA: Diagnosis not present

## 2022-03-16 DIAGNOSIS — I10 Essential (primary) hypertension: Secondary | ICD-10-CM | POA: Diagnosis not present

## 2022-03-16 DIAGNOSIS — M0520 Rheumatoid vasculitis with rheumatoid arthritis of unspecified site: Secondary | ICD-10-CM | POA: Diagnosis not present

## 2022-03-16 DIAGNOSIS — J9610 Chronic respiratory failure, unspecified whether with hypoxia or hypercapnia: Secondary | ICD-10-CM | POA: Diagnosis not present

## 2022-03-16 DIAGNOSIS — M810 Age-related osteoporosis without current pathological fracture: Secondary | ICD-10-CM | POA: Diagnosis not present

## 2022-03-16 DIAGNOSIS — N950 Postmenopausal bleeding: Secondary | ICD-10-CM | POA: Diagnosis not present

## 2022-03-16 DIAGNOSIS — G8929 Other chronic pain: Secondary | ICD-10-CM | POA: Diagnosis not present

## 2022-03-16 DIAGNOSIS — M48061 Spinal stenosis, lumbar region without neurogenic claudication: Secondary | ICD-10-CM | POA: Diagnosis not present

## 2022-03-16 DIAGNOSIS — K5900 Constipation, unspecified: Secondary | ICD-10-CM | POA: Diagnosis not present

## 2022-03-16 DIAGNOSIS — M1990 Unspecified osteoarthritis, unspecified site: Secondary | ICD-10-CM | POA: Diagnosis not present

## 2022-03-17 DIAGNOSIS — I10 Essential (primary) hypertension: Secondary | ICD-10-CM | POA: Diagnosis not present

## 2022-03-22 DIAGNOSIS — E785 Hyperlipidemia, unspecified: Secondary | ICD-10-CM | POA: Diagnosis not present

## 2022-03-22 DIAGNOSIS — M810 Age-related osteoporosis without current pathological fracture: Secondary | ICD-10-CM | POA: Diagnosis not present

## 2022-03-22 DIAGNOSIS — G20 Parkinson's disease: Secondary | ICD-10-CM | POA: Diagnosis not present

## 2022-03-22 DIAGNOSIS — R9431 Abnormal electrocardiogram [ECG] [EKG]: Secondary | ICD-10-CM | POA: Diagnosis not present

## 2022-03-22 DIAGNOSIS — E119 Type 2 diabetes mellitus without complications: Secondary | ICD-10-CM | POA: Diagnosis not present

## 2022-03-22 DIAGNOSIS — D518 Other vitamin B12 deficiency anemias: Secondary | ICD-10-CM | POA: Diagnosis not present

## 2022-03-22 DIAGNOSIS — M1990 Unspecified osteoarthritis, unspecified site: Secondary | ICD-10-CM | POA: Diagnosis not present

## 2022-03-22 DIAGNOSIS — G309 Alzheimer's disease, unspecified: Secondary | ICD-10-CM | POA: Diagnosis not present

## 2022-03-22 DIAGNOSIS — E559 Vitamin D deficiency, unspecified: Secondary | ICD-10-CM | POA: Diagnosis not present

## 2022-03-22 DIAGNOSIS — M0520 Rheumatoid vasculitis with rheumatoid arthritis of unspecified site: Secondary | ICD-10-CM | POA: Diagnosis not present

## 2022-03-22 DIAGNOSIS — I10 Essential (primary) hypertension: Secondary | ICD-10-CM | POA: Diagnosis not present

## 2022-03-22 DIAGNOSIS — E7800 Pure hypercholesterolemia, unspecified: Secondary | ICD-10-CM | POA: Diagnosis not present

## 2022-03-22 DIAGNOSIS — E038 Other specified hypothyroidism: Secondary | ICD-10-CM | POA: Diagnosis not present

## 2022-03-25 DIAGNOSIS — I10 Essential (primary) hypertension: Secondary | ICD-10-CM | POA: Diagnosis not present

## 2022-03-29 DIAGNOSIS — D518 Other vitamin B12 deficiency anemias: Secondary | ICD-10-CM | POA: Diagnosis not present

## 2022-03-29 DIAGNOSIS — E119 Type 2 diabetes mellitus without complications: Secondary | ICD-10-CM | POA: Diagnosis not present

## 2022-03-29 DIAGNOSIS — Z79899 Other long term (current) drug therapy: Secondary | ICD-10-CM | POA: Diagnosis not present

## 2022-03-29 DIAGNOSIS — E7849 Other hyperlipidemia: Secondary | ICD-10-CM | POA: Diagnosis not present

## 2022-04-03 DIAGNOSIS — J42 Unspecified chronic bronchitis: Secondary | ICD-10-CM | POA: Diagnosis not present

## 2022-04-11 DIAGNOSIS — I10 Essential (primary) hypertension: Secondary | ICD-10-CM | POA: Diagnosis not present

## 2022-04-12 DIAGNOSIS — D518 Other vitamin B12 deficiency anemias: Secondary | ICD-10-CM | POA: Diagnosis not present

## 2022-04-12 DIAGNOSIS — Z79899 Other long term (current) drug therapy: Secondary | ICD-10-CM | POA: Diagnosis not present

## 2022-04-12 DIAGNOSIS — E7849 Other hyperlipidemia: Secondary | ICD-10-CM | POA: Diagnosis not present

## 2022-04-12 DIAGNOSIS — E119 Type 2 diabetes mellitus without complications: Secondary | ICD-10-CM | POA: Diagnosis not present

## 2022-04-13 DIAGNOSIS — M810 Age-related osteoporosis without current pathological fracture: Secondary | ICD-10-CM | POA: Diagnosis not present

## 2022-04-13 DIAGNOSIS — G20 Parkinson's disease: Secondary | ICD-10-CM | POA: Diagnosis not present

## 2022-04-13 DIAGNOSIS — E559 Vitamin D deficiency, unspecified: Secondary | ICD-10-CM | POA: Diagnosis not present

## 2022-04-13 DIAGNOSIS — I10 Essential (primary) hypertension: Secondary | ICD-10-CM | POA: Diagnosis not present

## 2022-04-13 DIAGNOSIS — R600 Localized edema: Secondary | ICD-10-CM | POA: Diagnosis not present

## 2022-04-13 DIAGNOSIS — M0520 Rheumatoid vasculitis with rheumatoid arthritis of unspecified site: Secondary | ICD-10-CM | POA: Diagnosis not present

## 2022-04-13 DIAGNOSIS — I350 Nonrheumatic aortic (valve) stenosis: Secondary | ICD-10-CM | POA: Diagnosis not present

## 2022-04-13 DIAGNOSIS — G309 Alzheimer's disease, unspecified: Secondary | ICD-10-CM | POA: Diagnosis not present

## 2022-04-13 DIAGNOSIS — G4700 Insomnia, unspecified: Secondary | ICD-10-CM | POA: Diagnosis not present

## 2022-04-13 DIAGNOSIS — M48061 Spinal stenosis, lumbar region without neurogenic claudication: Secondary | ICD-10-CM | POA: Diagnosis not present

## 2022-04-13 DIAGNOSIS — R5381 Other malaise: Secondary | ICD-10-CM | POA: Diagnosis not present

## 2022-04-13 DIAGNOSIS — N3281 Overactive bladder: Secondary | ICD-10-CM | POA: Diagnosis not present

## 2022-04-19 ENCOUNTER — Telehealth: Payer: Self-pay | Admitting: Family Medicine

## 2022-04-19 NOTE — Telephone Encounter (Signed)
Copied from [place]. Topic: Medicare AWV ?>> Apr 19, 2022 11:15 AM Cher Nakai R wrote: ?Reason for CRM:  ?Left message for patient to call back and schedule Medicare Annual Wellness Visit (AWV) in office.  ? ?If unable to come into the office for AWV,  please offer to do virtually or by telephone. ? ?Last AWV: 08/24/2020 ? ?Please schedule at anytime with [facility] Health Advisor. ? ?30 minute appointment for Virtual or phone ?45 minute appointment for in office or Initial virtual/phone ? ?Any questions, please contact me at [phone] ?

## 2022-04-20 DIAGNOSIS — E119 Type 2 diabetes mellitus without complications: Secondary | ICD-10-CM | POA: Diagnosis not present

## 2022-04-20 DIAGNOSIS — M1990 Unspecified osteoarthritis, unspecified site: Secondary | ICD-10-CM | POA: Diagnosis not present

## 2022-04-20 DIAGNOSIS — G309 Alzheimer's disease, unspecified: Secondary | ICD-10-CM | POA: Diagnosis not present

## 2022-04-20 DIAGNOSIS — D518 Other vitamin B12 deficiency anemias: Secondary | ICD-10-CM | POA: Diagnosis not present

## 2022-04-20 DIAGNOSIS — M0520 Rheumatoid vasculitis with rheumatoid arthritis of unspecified site: Secondary | ICD-10-CM | POA: Diagnosis not present

## 2022-04-20 DIAGNOSIS — E785 Hyperlipidemia, unspecified: Secondary | ICD-10-CM | POA: Diagnosis not present

## 2022-04-20 DIAGNOSIS — E559 Vitamin D deficiency, unspecified: Secondary | ICD-10-CM | POA: Diagnosis not present

## 2022-04-20 DIAGNOSIS — I10 Essential (primary) hypertension: Secondary | ICD-10-CM | POA: Diagnosis not present

## 2022-04-20 DIAGNOSIS — M810 Age-related osteoporosis without current pathological fracture: Secondary | ICD-10-CM | POA: Diagnosis not present

## 2022-04-20 DIAGNOSIS — E038 Other specified hypothyroidism: Secondary | ICD-10-CM | POA: Diagnosis not present

## 2022-04-25 DIAGNOSIS — R0989 Other specified symptoms and signs involving the circulatory and respiratory systems: Secondary | ICD-10-CM | POA: Diagnosis not present

## 2022-04-25 DIAGNOSIS — I6523 Occlusion and stenosis of bilateral carotid arteries: Secondary | ICD-10-CM | POA: Diagnosis not present

## 2022-04-28 DIAGNOSIS — I77811 Abdominal aortic ectasia: Secondary | ICD-10-CM | POA: Diagnosis not present

## 2022-05-02 DIAGNOSIS — R221 Localized swelling, mass and lump, neck: Secondary | ICD-10-CM | POA: Diagnosis not present

## 2022-05-02 DIAGNOSIS — E042 Nontoxic multinodular goiter: Secondary | ICD-10-CM | POA: Diagnosis not present

## 2022-05-03 DIAGNOSIS — J42 Unspecified chronic bronchitis: Secondary | ICD-10-CM | POA: Diagnosis not present

## 2022-05-10 DIAGNOSIS — R2242 Localized swelling, mass and lump, left lower limb: Secondary | ICD-10-CM | POA: Diagnosis not present

## 2022-05-11 DIAGNOSIS — R69 Illness, unspecified: Secondary | ICD-10-CM | POA: Diagnosis not present

## 2022-05-11 DIAGNOSIS — R011 Cardiac murmur, unspecified: Secondary | ICD-10-CM | POA: Diagnosis not present

## 2022-05-11 DIAGNOSIS — G4700 Insomnia, unspecified: Secondary | ICD-10-CM | POA: Diagnosis not present

## 2022-05-11 DIAGNOSIS — G309 Alzheimer's disease, unspecified: Secondary | ICD-10-CM | POA: Diagnosis not present

## 2022-05-11 DIAGNOSIS — R600 Localized edema: Secondary | ICD-10-CM | POA: Diagnosis not present

## 2022-05-11 DIAGNOSIS — I10 Essential (primary) hypertension: Secondary | ICD-10-CM | POA: Diagnosis not present

## 2022-05-11 DIAGNOSIS — I350 Nonrheumatic aortic (valve) stenosis: Secondary | ICD-10-CM | POA: Diagnosis not present

## 2022-05-11 DIAGNOSIS — G20 Parkinson's disease: Secondary | ICD-10-CM | POA: Diagnosis not present

## 2022-05-11 DIAGNOSIS — J9610 Chronic respiratory failure, unspecified whether with hypoxia or hypercapnia: Secondary | ICD-10-CM | POA: Diagnosis not present

## 2022-05-11 DIAGNOSIS — M0520 Rheumatoid vasculitis with rheumatoid arthritis of unspecified site: Secondary | ICD-10-CM | POA: Diagnosis not present

## 2022-05-11 DIAGNOSIS — N3281 Overactive bladder: Secondary | ICD-10-CM | POA: Diagnosis not present

## 2022-05-11 DIAGNOSIS — K5900 Constipation, unspecified: Secondary | ICD-10-CM | POA: Diagnosis not present

## 2022-05-12 DIAGNOSIS — I10 Essential (primary) hypertension: Secondary | ICD-10-CM | POA: Diagnosis not present

## 2022-05-18 DIAGNOSIS — M810 Age-related osteoporosis without current pathological fracture: Secondary | ICD-10-CM | POA: Diagnosis not present

## 2022-05-18 DIAGNOSIS — G309 Alzheimer's disease, unspecified: Secondary | ICD-10-CM | POA: Diagnosis not present

## 2022-05-18 DIAGNOSIS — I10 Essential (primary) hypertension: Secondary | ICD-10-CM | POA: Diagnosis not present

## 2022-05-18 DIAGNOSIS — D518 Other vitamin B12 deficiency anemias: Secondary | ICD-10-CM | POA: Diagnosis not present

## 2022-05-18 DIAGNOSIS — E119 Type 2 diabetes mellitus without complications: Secondary | ICD-10-CM | POA: Diagnosis not present

## 2022-05-18 DIAGNOSIS — I70213 Atherosclerosis of native arteries of extremities with intermittent claudication, bilateral legs: Secondary | ICD-10-CM | POA: Diagnosis not present

## 2022-05-18 DIAGNOSIS — E559 Vitamin D deficiency, unspecified: Secondary | ICD-10-CM | POA: Diagnosis not present

## 2022-05-18 DIAGNOSIS — M0520 Rheumatoid vasculitis with rheumatoid arthritis of unspecified site: Secondary | ICD-10-CM | POA: Diagnosis not present

## 2022-05-18 DIAGNOSIS — E785 Hyperlipidemia, unspecified: Secondary | ICD-10-CM | POA: Diagnosis not present

## 2022-05-18 DIAGNOSIS — E782 Mixed hyperlipidemia: Secondary | ICD-10-CM | POA: Diagnosis not present

## 2022-05-18 DIAGNOSIS — M1990 Unspecified osteoarthritis, unspecified site: Secondary | ICD-10-CM | POA: Diagnosis not present

## 2022-05-18 DIAGNOSIS — E038 Other specified hypothyroidism: Secondary | ICD-10-CM | POA: Diagnosis not present

## 2022-05-26 DIAGNOSIS — R590 Localized enlarged lymph nodes: Secondary | ICD-10-CM | POA: Diagnosis not present

## 2022-05-30 DIAGNOSIS — I70223 Atherosclerosis of native arteries of extremities with rest pain, bilateral legs: Secondary | ICD-10-CM | POA: Diagnosis not present

## 2022-06-03 ENCOUNTER — Emergency Department: Payer: Medicare HMO

## 2022-06-03 ENCOUNTER — Other Ambulatory Visit: Payer: Self-pay

## 2022-06-03 ENCOUNTER — Emergency Department
Admission: EM | Admit: 2022-06-03 | Discharge: 2022-06-03 | Disposition: A | Discharge status: Skilled Nursing Facility | Payer: Medicare HMO | Attending: Emergency Medicine | Admitting: Emergency Medicine

## 2022-06-03 DIAGNOSIS — Z743 Need for continuous supervision: Secondary | ICD-10-CM | POA: Diagnosis not present

## 2022-06-03 DIAGNOSIS — R000 Tachycardia, unspecified: Secondary | ICD-10-CM | POA: Diagnosis not present

## 2022-06-03 DIAGNOSIS — K429 Umbilical hernia without obstruction or gangrene: Secondary | ICD-10-CM | POA: Diagnosis not present

## 2022-06-03 DIAGNOSIS — R1084 Generalized abdominal pain: Secondary | ICD-10-CM | POA: Diagnosis not present

## 2022-06-03 DIAGNOSIS — K5900 Constipation, unspecified: Secondary | ICD-10-CM | POA: Diagnosis not present

## 2022-06-03 DIAGNOSIS — R52 Pain, unspecified: Secondary | ICD-10-CM | POA: Diagnosis not present

## 2022-06-03 DIAGNOSIS — R0902 Hypoxemia: Secondary | ICD-10-CM | POA: Diagnosis not present

## 2022-06-03 DIAGNOSIS — R002 Palpitations: Secondary | ICD-10-CM | POA: Diagnosis present

## 2022-06-03 DIAGNOSIS — J42 Unspecified chronic bronchitis: Secondary | ICD-10-CM | POA: Diagnosis not present

## 2022-06-03 DIAGNOSIS — K5730 Diverticulosis of large intestine without perforation or abscess without bleeding: Secondary | ICD-10-CM | POA: Diagnosis not present

## 2022-06-03 DIAGNOSIS — I471 Supraventricular tachycardia: Secondary | ICD-10-CM | POA: Diagnosis not present

## 2022-06-03 LAB — COMPREHENSIVE METABOLIC PANEL
ALT: 6 U/L (ref 0–44)
AST: 46 U/L — ABNORMAL HIGH (ref 15–41)
Albumin: 4 g/dL (ref 3.5–5.0)
Alkaline Phosphatase: 77 U/L (ref 38–126)
Anion gap: 6 (ref 5–15)
BUN: 14 mg/dL (ref 8–23)
CO2: 29 mmol/L (ref 22–32)
Calcium: 9.1 mg/dL (ref 8.9–10.3)
Chloride: 106 mmol/L (ref 98–111)
Creatinine, Ser: 0.69 mg/dL (ref 0.44–1.00)
GFR, Estimated: >60 mL/min (ref >60)
Glucose, Bld: 125 mg/dL — ABNORMAL HIGH (ref 70–99)
Potassium: 3.6 mmol/L (ref 3.5–5.1)
Sodium: 141 mmol/L (ref 135–145)
Total Bilirubin: 1.1 mg/dL (ref 0.3–1.2)
Total Protein: 7 g/dL (ref 6.5–8.1)

## 2022-06-03 LAB — URINALYSIS, ROUTINE W REFLEX MICROSCOPIC
Bilirubin Urine: NEGATIVE
Glucose, UA: NEGATIVE mg/dL
Hgb urine dipstick: NEGATIVE
Ketones, ur: NEGATIVE mg/dL
Leukocytes,Ua: NEGATIVE
Nitrite: NEGATIVE
Protein, ur: NEGATIVE mg/dL
Specific Gravity, Urine: 1.035 — ABNORMAL HIGH (ref 1.005–1.030)
pH: 8 (ref 5.0–8.0)

## 2022-06-03 LAB — CBC WITH DIFFERENTIAL/PLATELET
Abs Immature Granulocytes: 0.03 10*3/uL (ref 0.00–0.07)
Basophils Absolute: 0 10*3/uL (ref 0.0–0.1)
Basophils Relative: 0 %
Eosinophils Absolute: 0.1 10*3/uL (ref 0.0–0.5)
Eosinophils Relative: 1 %
HCT: 41.1 % (ref 36.0–46.0)
Hemoglobin: 13.3 g/dL (ref 12.0–15.0)
Immature Granulocytes: 0 %
Lymphocytes Relative: 25 %
Lymphs Abs: 2 10*3/uL (ref 0.7–4.0)
MCH: 32.7 pg (ref 26.0–34.0)
MCHC: 32.4 g/dL (ref 30.0–36.0)
MCV: 101 fL — ABNORMAL HIGH (ref 80.0–100.0)
Monocytes Absolute: 0.7 10*3/uL (ref 0.1–1.0)
Monocytes Relative: 8 %
Neutro Abs: 5.2 10*3/uL (ref 1.7–7.7)
Neutrophils Relative %: 66 %
Platelets: 173 10*3/uL (ref 150–400)
RBC: 4.07 MIL/uL (ref 3.87–5.11)
RDW: 14 % (ref 11.5–15.5)
WBC: 8 10*3/uL (ref 4.0–10.5)
nRBC: 0 % (ref 0.0–0.2)

## 2022-06-03 LAB — TROPONIN I (HIGH SENSITIVITY)
Troponin I (High Sensitivity): 87 ng/L — ABNORMAL HIGH (ref <18)
Troponin I (High Sensitivity): 194 ng/L (ref <18)

## 2022-06-03 LAB — LACTIC ACID, PLASMA
Lactic Acid, Venous: 2.2 mmol/L (ref 0.5–1.9)
Lactic Acid, Venous: 1.2 mmol/L (ref 0.5–1.9)

## 2022-06-03 LAB — LIPASE, BLOOD: Lipase: 23 U/L (ref 11–51)

## 2022-06-03 LAB — MAGNESIUM: Magnesium: 1.9 mg/dL (ref 1.7–2.4)

## 2022-06-03 MED ORDER — SODIUM CHLORIDE 0.9 % IV BOLUS
1000.0000 mL | Freq: Once | INTRAVENOUS | Status: AC
  Administered 2022-06-03: 1000 mL via INTRAVENOUS

## 2022-06-03 MED ORDER — POLYETHYLENE GLYCOL 3350 17 G PO PACK: 17.0000 g | PACK | Freq: Every day | ORAL | 0 refills | Status: DC | PRN

## 2022-06-03 MED ORDER — IOHEXOL 300 MG/ML  SOLN
80.0000 mL | Freq: Once | INTRAMUSCULAR | Status: AC | PRN
  Administered 2022-06-03: 80 mL via INTRAVENOUS

## 2022-06-03 NOTE — ED Notes (Signed)
Purewick placed, pt's linens rearranged, pt. Repositioned for comfort.

## 2022-06-03 NOTE — ED Provider Notes (Signed)
[facility] Provider Note   Event Date/Time   First MD Initiated Contact with Patient 06/03/22 1515     (approximate) History  Abdominal Pain (Pt. To ED from [facility] via EMS for abdominal pain x3 days. Medic reports pt. In SVT with HR of 180's, pt's HR 105 on triage. Per family, pt. Has issues with constipation and takes miralax daily.)  HPI Morgan Dennis is a 88 y.o. female  Location: Lower abdomen Duration: 3 days prior to arrival Timing: Stable since onset Severity: 4/10 Quality: Aching pain Context: Patient is in a skilled nursing LAD and has been complaining of of bilateral lower quadrant abdominal pain over the past 3 days.  Patient was picked up by EMS today and found to be in SVT with heart rate in the 180s that resolved prior to arrival Modifying factors: Denies Associated Symptoms: Palpitations, SVT (resolved) ROS: Patient currently denies any vision changes, tinnitus, difficulty speaking, facial droop, sore throat, chest pain, shortness of breath, nausea/vomiting/diarrhea, dysuria, or weakness/numbness/paresthesias in any extremity   Physical Exam  Triage Vital Signs: ED Triage Vitals  Enc Vitals Group     BP 06/03/22 1240 132/84     Pulse Rate 06/03/22 1240 (!) 105     Resp 06/03/22 1240 (!) 30     Temp 06/03/22 1240 (!) 97.4 F (36.3 C)     Temp Source 06/03/22 1240 Oral     SpO2 06/03/22 1240 98 %     Weight 06/03/22 1242 187 lb 3.2 oz (84.9 kg)     Height 06/03/22 1242 '5\' 2"'$  (1.575 m)     Head Circumference --      Peak Flow --      Pain Score 06/03/22 1241 4     Pain Loc --      Pain Edu? --      Excl. in [place]? --    Most recent vital signs: Vitals:   06/03/22 1715 06/03/22 1824  BP: (!) 154/103   Pulse: 70   Resp: 18   Temp:    SpO2: (!) 83% 92%   General: Awake, oriented x4. CV:  Good peripheral perfusion.  Resp:  Normal effort.  Abd:  No distention.  Other:  Elderly Caucasian female laying in bed in no acute  distress ED Results / Procedures / Treatments  Labs (all labs ordered are listed, but only abnormal results are displayed) Labs Reviewed  COMPREHENSIVE METABOLIC PANEL - Abnormal; Notable for the following components:      Result Value   Glucose, Bld 125 (*)    AST 46 (*)    All other components within normal limits  LACTIC ACID, PLASMA - Abnormal; Notable for the following components:   Lactic Acid, Venous 2.2 (*)    All other components within normal limits  CBC WITH DIFFERENTIAL/PLATELET - Abnormal; Notable for the following components:   MCV 101.0 (*)    All other components within normal limits  URINALYSIS, ROUTINE W REFLEX MICROSCOPIC - Abnormal; Notable for the following components:   Color, Urine YELLOW (*)    APPearance CLOUDY (*)    Specific Gravity, Urine 1.035 (*)    Bacteria, UA RARE (*)    All other components within normal limits  TROPONIN I (HIGH SENSITIVITY) - Abnormal; Notable for the following components:   Troponin I (High Sensitivity) 87 (*)    All other components within normal limits  TROPONIN I (HIGH SENSITIVITY) - Abnormal; Notable for the following components:  Troponin I (High Sensitivity) 194 (*)    All other components within normal limits  LACTIC ACID, PLASMA  LIPASE, BLOOD  MAGNESIUM   EKG ED ECG REPORT I, Naaman Plummer, the attending physician, personally viewed and interpreted this ECG. Date: 06/03/2022 EKG Time: 1240 Rate: 106 Rhythm: Tachycardic sinus rhythm QRS Axis: normal Intervals: normal ST/T Wave abnormalities: normal Narrative Interpretation: Tachycardic sinus rhythm.  No evidence of acute ischemia RADIOLOGY ED MD interpretation: CT of the abdomen pelvis with IV contrast interpreted by me and shows mild diverticulosis without evidence of diverticulitis, mild constipation, and subcentimeter left renal cyst -Agree with radiology assessment Official radiology report(s): No results found. PROCEDURES: Critical Care performed:  No .1-3 Lead EKG Interpretation  Performed by: Naaman Plummer, MD Authorized by: Naaman Plummer, MD     Interpretation: normal     ECG rate:  89   ECG rate assessment: normal     Rhythm: sinus rhythm     Ectopy: none     Conduction: normal    MEDICATIONS ORDERED IN ED: Medications  sodium chloride 0.9 % bolus 1,000 mL (0 mLs Intravenous Stopped 06/03/22 1540)  iohexol (OMNIPAQUE) 300 MG/ML solution 80 mL (80 mLs Intravenous Contrast Given 06/03/22 1416)   IMPRESSION / MDM / ASSESSMENT AND PLAN / ED COURSE  I reviewed the triage vital signs and the nursing notes.                             The patient is on the cardiac monitor to evaluate for evidence of arrhythmia and/or significant heart rate changes. Patient's presentation is most consistent with acute presentation with potential threat to life or bodily function. Patients symptoms not typical for emergent causes of abdominal pain such as, but not limited to, appendicitis, abdominal aortic aneurysm, surgical biliary disease, pancreatitis, SBO, mesenteric ischemia, serious intra-abdominal bacterial illness. Presentation also not typical of gynecologic emergencies such as TOA, Ovarian Torsion, PID. Not Ectopic. Doubt atypical ACS.  Pt tolerating PO. Disposition: Care of this patient will be signed out to the oncoming physician at the end of my shift.  All pertinent patient information conveyed and all questions answered.  All further care and disposition decisions will be made by the oncoming physician.   FINAL CLINICAL IMPRESSION(S) / ED DIAGNOSES   Final diagnoses:  SVT (supraventricular tachycardia) (HCC)  Generalized abdominal pain  Constipation, unspecified constipation type   Rx / DC Orders   ED Discharge Orders          Ordered    polyethylene glycol (MIRALAX) 17 g packet  Daily PRN,   Status:  Discontinued        06/03/22 1459    Ambulatory referral to Cardiology        06/03/22 1802    polyethylene glycol  (MIRALAX) 17 g packet  Daily PRN        06/03/22 1818           Note:  This document was prepared using Dragon voice recognition software and may include unintentional dictation errors.   Naaman Plummer, MD 06/04/22 [phone]

## 2022-06-03 NOTE — ED Notes (Signed)
Pt's O2 sat on RA dropped to 88%, this RN applied 2L [place] O2, notified provider.

## 2022-06-03 NOTE — ED Notes (Signed)
Pt. Sons to bedside

## 2022-06-03 NOTE — ED Notes (Signed)
Date and time results received: 06/03/22 1620 (use smartphrase ".now" to insert current time)  Test: trop Critical Value: 194  Name of Provider Notified: Dr. Charna Archer  Orders Received? Or Actions Taken?:

## 2022-06-03 NOTE — ED Notes (Signed)
Date and time results received: 06/03/22 1345 (use smartphrase ".now" to insert current time)  Test: lactic acid   Critical Value: 2.2  Name of Provider Notified: bradler   Orders Received? Or Actions Taken?: will continue to monitor.

## 2022-06-03 NOTE — ED Provider Notes (Signed)
-----------------------------------------   3:57 PM on 06/03/2022 -----------------------------------------  Blood pressure (!) 144/89, pulse (!) 106, temperature (!) 97.4 F (36.3 C), temperature source Oral, resp. rate 19, height '5\' 2"'$  (1.575 m), weight 84.9 kg, SpO2 100 %.  Assuming care from Dr. Cheri Fowler.  In short, Morgan Dennis is a 88 y.o. female with a chief complaint of Abdominal Pain (Pt. To ED from [facility] via EMS for abdominal pain x3 days. Medic reports pt. In SVT with HR of 180's, pt's HR 105 on triage. Per family, pt. Has issues with constipation and takes miralax daily.) .  Refer to the original H&P for additional details.  The current plan of care is to follow-up repeat lactate and troponin following episode of SVT.  ----------------------------------------- 6:03 PM on 06/03/2022 ----------------------------------------- Repeat lactic acid within normal limits, troponin is uptrending but not more than what would be expected for patient's run of SVT.  Patient is eager for discharge home and per discussion with cardiology by Dr. Cheri Fowler earlier, patient is appropriate for discharge home with cardiology follow-up, low suspicion for ACS at this time given patient is asymptomatic on reassessment.  Son counseled to have patient return to the ED for new or worsening symptoms, patient and son agree with plan.    Blake Divine, MD 06/03/22 [phone]

## 2022-06-03 NOTE — ED Triage Notes (Signed)
Pt. To ED from [facility] via EMS for abdominal pain x3 days. Medic reports pt. In SVT with HR of 180's, pt's HR 105 on triage. Per family, pt. Has issues with constipation and takes miralax daily.

## 2022-06-03 NOTE — ED Triage Notes (Signed)
Pt. To ED from [facility] via EMS for abdominal pain x3 days. Medic reports pt. In SVT with HR of 180's, pt's HR 105 on triage. Per family, pt. Has issues with constipation and takes miralax daily.

## 2022-06-06 DIAGNOSIS — E782 Mixed hyperlipidemia: Secondary | ICD-10-CM | POA: Diagnosis not present

## 2022-06-06 DIAGNOSIS — Z79899 Other long term (current) drug therapy: Secondary | ICD-10-CM | POA: Diagnosis not present

## 2022-06-06 DIAGNOSIS — D518 Other vitamin B12 deficiency anemias: Secondary | ICD-10-CM | POA: Diagnosis not present

## 2022-06-06 DIAGNOSIS — E559 Vitamin D deficiency, unspecified: Secondary | ICD-10-CM | POA: Diagnosis not present

## 2022-06-06 DIAGNOSIS — E038 Other specified hypothyroidism: Secondary | ICD-10-CM | POA: Diagnosis not present

## 2022-06-08 DIAGNOSIS — I350 Nonrheumatic aortic (valve) stenosis: Secondary | ICD-10-CM | POA: Diagnosis not present

## 2022-06-08 DIAGNOSIS — E559 Vitamin D deficiency, unspecified: Secondary | ICD-10-CM | POA: Diagnosis not present

## 2022-06-08 DIAGNOSIS — M810 Age-related osteoporosis without current pathological fracture: Secondary | ICD-10-CM | POA: Diagnosis not present

## 2022-06-08 DIAGNOSIS — R600 Localized edema: Secondary | ICD-10-CM | POA: Diagnosis not present

## 2022-06-08 DIAGNOSIS — N3281 Overactive bladder: Secondary | ICD-10-CM | POA: Diagnosis not present

## 2022-06-08 DIAGNOSIS — R011 Cardiac murmur, unspecified: Secondary | ICD-10-CM | POA: Diagnosis not present

## 2022-06-08 DIAGNOSIS — G4700 Insomnia, unspecified: Secondary | ICD-10-CM | POA: Diagnosis not present

## 2022-06-08 DIAGNOSIS — M48061 Spinal stenosis, lumbar region without neurogenic claudication: Secondary | ICD-10-CM | POA: Diagnosis not present

## 2022-06-08 DIAGNOSIS — I10 Essential (primary) hypertension: Secondary | ICD-10-CM | POA: Diagnosis not present

## 2022-06-08 DIAGNOSIS — M0520 Rheumatoid vasculitis with rheumatoid arthritis of unspecified site: Secondary | ICD-10-CM | POA: Diagnosis not present

## 2022-06-09 DIAGNOSIS — B351 Tinea unguium: Secondary | ICD-10-CM | POA: Diagnosis not present

## 2022-06-09 DIAGNOSIS — L600 Ingrowing nail: Secondary | ICD-10-CM | POA: Diagnosis not present

## 2022-06-09 DIAGNOSIS — M79672 Pain in left foot: Secondary | ICD-10-CM | POA: Diagnosis not present

## 2022-06-09 DIAGNOSIS — M1991 Primary osteoarthritis, unspecified site: Secondary | ICD-10-CM | POA: Diagnosis not present

## 2022-06-09 DIAGNOSIS — M79671 Pain in right foot: Secondary | ICD-10-CM | POA: Diagnosis not present

## 2022-06-12 DIAGNOSIS — I10 Essential (primary) hypertension: Secondary | ICD-10-CM | POA: Diagnosis not present

## 2022-06-17 DIAGNOSIS — E559 Vitamin D deficiency, unspecified: Secondary | ICD-10-CM | POA: Diagnosis not present

## 2022-06-17 DIAGNOSIS — M810 Age-related osteoporosis without current pathological fracture: Secondary | ICD-10-CM | POA: Diagnosis not present

## 2022-06-17 DIAGNOSIS — D518 Other vitamin B12 deficiency anemias: Secondary | ICD-10-CM | POA: Diagnosis not present

## 2022-06-17 DIAGNOSIS — E038 Other specified hypothyroidism: Secondary | ICD-10-CM | POA: Diagnosis not present

## 2022-06-17 DIAGNOSIS — E785 Hyperlipidemia, unspecified: Secondary | ICD-10-CM | POA: Diagnosis not present

## 2022-06-17 DIAGNOSIS — M1990 Unspecified osteoarthritis, unspecified site: Secondary | ICD-10-CM | POA: Diagnosis not present

## 2022-06-17 DIAGNOSIS — I10 Essential (primary) hypertension: Secondary | ICD-10-CM | POA: Diagnosis not present

## 2022-06-17 DIAGNOSIS — G309 Alzheimer's disease, unspecified: Secondary | ICD-10-CM | POA: Diagnosis not present

## 2022-06-17 DIAGNOSIS — E119 Type 2 diabetes mellitus without complications: Secondary | ICD-10-CM | POA: Diagnosis not present

## 2022-06-17 DIAGNOSIS — E782 Mixed hyperlipidemia: Secondary | ICD-10-CM | POA: Diagnosis not present

## 2022-06-17 DIAGNOSIS — M0520 Rheumatoid vasculitis with rheumatoid arthritis of unspecified site: Secondary | ICD-10-CM | POA: Diagnosis not present

## 2022-06-20 DIAGNOSIS — H5789 Other specified disorders of eye and adnexa: Secondary | ICD-10-CM | POA: Diagnosis not present

## 2022-06-28 DIAGNOSIS — R059 Cough, unspecified: Secondary | ICD-10-CM | POA: Diagnosis not present

## 2022-06-29 DIAGNOSIS — I10 Essential (primary) hypertension: Secondary | ICD-10-CM | POA: Diagnosis not present

## 2022-06-29 DIAGNOSIS — M0520 Rheumatoid vasculitis with rheumatoid arthritis of unspecified site: Secondary | ICD-10-CM | POA: Diagnosis not present

## 2022-06-29 DIAGNOSIS — J189 Pneumonia, unspecified organism: Secondary | ICD-10-CM | POA: Diagnosis not present

## 2022-06-29 DIAGNOSIS — R1312 Dysphagia, oropharyngeal phase: Secondary | ICD-10-CM | POA: Diagnosis not present

## 2022-06-29 DIAGNOSIS — M48061 Spinal stenosis, lumbar region without neurogenic claudication: Secondary | ICD-10-CM | POA: Diagnosis not present

## 2022-06-29 DIAGNOSIS — R1311 Dysphagia, oral phase: Secondary | ICD-10-CM | POA: Diagnosis not present

## 2022-06-29 DIAGNOSIS — R5381 Other malaise: Secondary | ICD-10-CM | POA: Diagnosis not present

## 2022-06-29 DIAGNOSIS — F0390 Unspecified dementia without behavioral disturbance: Secondary | ICD-10-CM | POA: Diagnosis not present

## 2022-06-29 DIAGNOSIS — R1310 Dysphagia, unspecified: Secondary | ICD-10-CM | POA: Diagnosis not present

## 2022-06-29 DIAGNOSIS — J9611 Chronic respiratory failure with hypoxia: Secondary | ICD-10-CM | POA: Diagnosis not present

## 2022-06-29 DIAGNOSIS — E785 Hyperlipidemia, unspecified: Secondary | ICD-10-CM | POA: Diagnosis not present

## 2022-06-29 DIAGNOSIS — I350 Nonrheumatic aortic (valve) stenosis: Secondary | ICD-10-CM | POA: Diagnosis not present

## 2022-06-29 DIAGNOSIS — I2699 Other pulmonary embolism without acute cor pulmonale: Secondary | ICD-10-CM | POA: Diagnosis not present

## 2022-06-29 DIAGNOSIS — G20 Parkinson's disease: Secondary | ICD-10-CM | POA: Diagnosis not present

## 2022-06-29 DIAGNOSIS — R69 Illness, unspecified: Secondary | ICD-10-CM | POA: Diagnosis not present

## 2022-06-29 DIAGNOSIS — U071 COVID-19: Secondary | ICD-10-CM | POA: Diagnosis not present

## 2022-06-29 DIAGNOSIS — G20C Parkinsonism, unspecified: Secondary | ICD-10-CM | POA: Diagnosis not present

## 2022-06-29 DIAGNOSIS — J9610 Chronic respiratory failure, unspecified whether with hypoxia or hypercapnia: Secondary | ICD-10-CM | POA: Diagnosis not present

## 2022-06-29 DIAGNOSIS — G309 Alzheimer's disease, unspecified: Secondary | ICD-10-CM | POA: Diagnosis not present

## 2022-06-29 DIAGNOSIS — Z9981 Dependence on supplemental oxygen: Secondary | ICD-10-CM | POA: Diagnosis not present

## 2022-06-29 DIAGNOSIS — N950 Postmenopausal bleeding: Secondary | ICD-10-CM | POA: Diagnosis not present

## 2022-06-29 DIAGNOSIS — M810 Age-related osteoporosis without current pathological fracture: Secondary | ICD-10-CM | POA: Diagnosis not present

## 2022-06-29 DIAGNOSIS — E559 Vitamin D deficiency, unspecified: Secondary | ICD-10-CM | POA: Diagnosis not present

## 2022-07-03 DIAGNOSIS — J42 Unspecified chronic bronchitis: Secondary | ICD-10-CM | POA: Diagnosis not present

## 2022-07-06 DIAGNOSIS — R0989 Other specified symptoms and signs involving the circulatory and respiratory systems: Secondary | ICD-10-CM | POA: Diagnosis not present

## 2022-07-06 DIAGNOSIS — R5381 Other malaise: Secondary | ICD-10-CM | POA: Diagnosis not present

## 2022-07-06 DIAGNOSIS — G309 Alzheimer's disease, unspecified: Secondary | ICD-10-CM | POA: Diagnosis not present

## 2022-07-06 DIAGNOSIS — G4700 Insomnia, unspecified: Secondary | ICD-10-CM | POA: Diagnosis not present

## 2022-07-06 DIAGNOSIS — G20 Parkinson's disease: Secondary | ICD-10-CM | POA: Diagnosis not present

## 2022-07-06 DIAGNOSIS — R1310 Dysphagia, unspecified: Secondary | ICD-10-CM | POA: Diagnosis not present

## 2022-07-06 DIAGNOSIS — M1990 Unspecified osteoarthritis, unspecified site: Secondary | ICD-10-CM | POA: Diagnosis not present

## 2022-07-06 DIAGNOSIS — I10 Essential (primary) hypertension: Secondary | ICD-10-CM | POA: Diagnosis not present

## 2022-07-06 DIAGNOSIS — I350 Nonrheumatic aortic (valve) stenosis: Secondary | ICD-10-CM | POA: Diagnosis not present

## 2022-07-07 DIAGNOSIS — J811 Chronic pulmonary edema: Secondary | ICD-10-CM | POA: Diagnosis not present

## 2022-07-12 DIAGNOSIS — I10 Essential (primary) hypertension: Secondary | ICD-10-CM | POA: Diagnosis not present

## 2022-07-18 DIAGNOSIS — E119 Type 2 diabetes mellitus without complications: Secondary | ICD-10-CM | POA: Diagnosis not present

## 2022-07-18 DIAGNOSIS — M1990 Unspecified osteoarthritis, unspecified site: Secondary | ICD-10-CM | POA: Diagnosis not present

## 2022-07-18 DIAGNOSIS — E785 Hyperlipidemia, unspecified: Secondary | ICD-10-CM | POA: Diagnosis not present

## 2022-07-18 DIAGNOSIS — D518 Other vitamin B12 deficiency anemias: Secondary | ICD-10-CM | POA: Diagnosis not present

## 2022-07-18 DIAGNOSIS — E782 Mixed hyperlipidemia: Secondary | ICD-10-CM | POA: Diagnosis not present

## 2022-07-18 DIAGNOSIS — E038 Other specified hypothyroidism: Secondary | ICD-10-CM | POA: Diagnosis not present

## 2022-07-18 DIAGNOSIS — G309 Alzheimer's disease, unspecified: Secondary | ICD-10-CM | POA: Diagnosis not present

## 2022-07-18 DIAGNOSIS — M0520 Rheumatoid vasculitis with rheumatoid arthritis of unspecified site: Secondary | ICD-10-CM | POA: Diagnosis not present

## 2022-07-18 DIAGNOSIS — E559 Vitamin D deficiency, unspecified: Secondary | ICD-10-CM | POA: Diagnosis not present

## 2022-07-18 DIAGNOSIS — I10 Essential (primary) hypertension: Secondary | ICD-10-CM | POA: Diagnosis not present

## 2022-07-18 DIAGNOSIS — M810 Age-related osteoporosis without current pathological fracture: Secondary | ICD-10-CM | POA: Diagnosis not present

## 2022-08-03 DIAGNOSIS — R1310 Dysphagia, unspecified: Secondary | ICD-10-CM | POA: Diagnosis not present

## 2022-08-03 DIAGNOSIS — E785 Hyperlipidemia, unspecified: Secondary | ICD-10-CM | POA: Diagnosis not present

## 2022-08-03 DIAGNOSIS — M0520 Rheumatoid vasculitis with rheumatoid arthritis of unspecified site: Secondary | ICD-10-CM | POA: Diagnosis not present

## 2022-08-03 DIAGNOSIS — R1110 Vomiting, unspecified: Secondary | ICD-10-CM | POA: Diagnosis not present

## 2022-08-03 DIAGNOSIS — R0989 Other specified symptoms and signs involving the circulatory and respiratory systems: Secondary | ICD-10-CM | POA: Diagnosis not present

## 2022-08-03 DIAGNOSIS — G20 Parkinson's disease: Secondary | ICD-10-CM | POA: Diagnosis not present

## 2022-08-03 DIAGNOSIS — J42 Unspecified chronic bronchitis: Secondary | ICD-10-CM | POA: Diagnosis not present

## 2022-08-03 DIAGNOSIS — I350 Nonrheumatic aortic (valve) stenosis: Secondary | ICD-10-CM | POA: Diagnosis not present

## 2022-08-03 DIAGNOSIS — I10 Essential (primary) hypertension: Secondary | ICD-10-CM | POA: Diagnosis not present

## 2022-08-03 DIAGNOSIS — R5381 Other malaise: Secondary | ICD-10-CM | POA: Diagnosis not present

## 2022-08-03 DIAGNOSIS — N950 Postmenopausal bleeding: Secondary | ICD-10-CM | POA: Diagnosis not present

## 2022-08-03 DIAGNOSIS — R0902 Hypoxemia: Secondary | ICD-10-CM | POA: Diagnosis not present

## 2022-08-03 DIAGNOSIS — G4700 Insomnia, unspecified: Secondary | ICD-10-CM | POA: Diagnosis not present

## 2022-08-05 DIAGNOSIS — R0902 Hypoxemia: Secondary | ICD-10-CM | POA: Diagnosis not present

## 2022-08-06 DIAGNOSIS — R0902 Hypoxemia: Secondary | ICD-10-CM | POA: Diagnosis not present

## 2022-08-06 DIAGNOSIS — J9610 Chronic respiratory failure, unspecified whether with hypoxia or hypercapnia: Secondary | ICD-10-CM | POA: Diagnosis not present

## 2022-08-08 DIAGNOSIS — E8581 Light chain (AL) amyloidosis: Secondary | ICD-10-CM | POA: Diagnosis not present

## 2022-08-08 DIAGNOSIS — D518 Other vitamin B12 deficiency anemias: Secondary | ICD-10-CM | POA: Diagnosis not present

## 2022-08-08 DIAGNOSIS — E782 Mixed hyperlipidemia: Secondary | ICD-10-CM | POA: Diagnosis not present

## 2022-08-08 DIAGNOSIS — Z79899 Other long term (current) drug therapy: Secondary | ICD-10-CM | POA: Diagnosis not present

## 2022-08-10 DIAGNOSIS — R600 Localized edema: Secondary | ICD-10-CM | POA: Diagnosis not present

## 2022-08-10 DIAGNOSIS — I10 Essential (primary) hypertension: Secondary | ICD-10-CM | POA: Diagnosis not present

## 2022-08-10 DIAGNOSIS — K5900 Constipation, unspecified: Secondary | ICD-10-CM | POA: Diagnosis not present

## 2022-08-10 DIAGNOSIS — E559 Vitamin D deficiency, unspecified: Secondary | ICD-10-CM | POA: Diagnosis not present

## 2022-08-10 DIAGNOSIS — N3281 Overactive bladder: Secondary | ICD-10-CM | POA: Diagnosis not present

## 2022-08-10 DIAGNOSIS — G4700 Insomnia, unspecified: Secondary | ICD-10-CM | POA: Diagnosis not present

## 2022-08-10 DIAGNOSIS — I350 Nonrheumatic aortic (valve) stenosis: Secondary | ICD-10-CM | POA: Diagnosis not present

## 2022-08-10 DIAGNOSIS — R7401 Elevation of levels of liver transaminase levels: Secondary | ICD-10-CM | POA: Diagnosis not present

## 2022-08-12 DIAGNOSIS — I10 Essential (primary) hypertension: Secondary | ICD-10-CM | POA: Diagnosis not present

## 2022-08-15 DIAGNOSIS — E782 Mixed hyperlipidemia: Secondary | ICD-10-CM | POA: Diagnosis not present

## 2022-08-15 DIAGNOSIS — E119 Type 2 diabetes mellitus without complications: Secondary | ICD-10-CM | POA: Diagnosis not present

## 2022-08-15 DIAGNOSIS — Z79899 Other long term (current) drug therapy: Secondary | ICD-10-CM | POA: Diagnosis not present

## 2022-08-15 DIAGNOSIS — D518 Other vitamin B12 deficiency anemias: Secondary | ICD-10-CM | POA: Diagnosis not present

## 2022-08-16 DIAGNOSIS — J811 Chronic pulmonary edema: Secondary | ICD-10-CM | POA: Diagnosis not present

## 2022-08-17 ENCOUNTER — Other Ambulatory Visit: Payer: Self-pay

## 2022-08-17 ENCOUNTER — Inpatient Hospital Stay
Admission: EM | Admit: 2022-08-17 | Discharge: 2022-08-19 | DRG: 177 | Disposition: A | Discharge status: Home Health | Payer: Medicare HMO | Source: Skilled Nursing Facility | Attending: Internal Medicine | Admitting: Internal Medicine

## 2022-08-17 ENCOUNTER — Emergency Department: Payer: Medicare HMO

## 2022-08-17 DIAGNOSIS — R600 Localized edema: Secondary | ICD-10-CM | POA: Diagnosis not present

## 2022-08-17 DIAGNOSIS — Z79631 Long term (current) use of antimetabolite agent: Secondary | ICD-10-CM | POA: Diagnosis not present

## 2022-08-17 DIAGNOSIS — L853 Xerosis cutis: Secondary | ICD-10-CM | POA: Diagnosis not present

## 2022-08-17 DIAGNOSIS — R011 Cardiac murmur, unspecified: Secondary | ICD-10-CM | POA: Diagnosis not present

## 2022-08-17 DIAGNOSIS — Z66 Do not resuscitate: Secondary | ICD-10-CM | POA: Diagnosis not present

## 2022-08-17 DIAGNOSIS — Z7952 Long term (current) use of systemic steroids: Secondary | ICD-10-CM

## 2022-08-17 DIAGNOSIS — Z79899 Other long term (current) drug therapy: Secondary | ICD-10-CM

## 2022-08-17 DIAGNOSIS — Z9981 Dependence on supplemental oxygen: Secondary | ICD-10-CM | POA: Diagnosis not present

## 2022-08-17 DIAGNOSIS — M069 Rheumatoid arthritis, unspecified: Secondary | ICD-10-CM | POA: Diagnosis present

## 2022-08-17 DIAGNOSIS — G2581 Restless legs syndrome: Secondary | ICD-10-CM | POA: Diagnosis not present

## 2022-08-17 DIAGNOSIS — Z743 Need for continuous supervision: Secondary | ICD-10-CM | POA: Diagnosis not present

## 2022-08-17 DIAGNOSIS — R5381 Other malaise: Secondary | ICD-10-CM | POA: Diagnosis not present

## 2022-08-17 DIAGNOSIS — E785 Hyperlipidemia, unspecified: Secondary | ICD-10-CM | POA: Diagnosis not present

## 2022-08-17 DIAGNOSIS — E876 Hypokalemia: Secondary | ICD-10-CM | POA: Diagnosis present

## 2022-08-17 DIAGNOSIS — R1310 Dysphagia, unspecified: Secondary | ICD-10-CM | POA: Diagnosis not present

## 2022-08-17 DIAGNOSIS — E669 Obesity, unspecified: Secondary | ICD-10-CM | POA: Diagnosis present

## 2022-08-17 DIAGNOSIS — I10 Essential (primary) hypertension: Secondary | ICD-10-CM

## 2022-08-17 DIAGNOSIS — J9621 Acute and chronic respiratory failure with hypoxia: Secondary | ICD-10-CM | POA: Diagnosis present

## 2022-08-17 DIAGNOSIS — T380X5A Adverse effect of glucocorticoids and synthetic analogues, initial encounter: Secondary | ICD-10-CM | POA: Diagnosis present

## 2022-08-17 DIAGNOSIS — G9341 Metabolic encephalopathy: Secondary | ICD-10-CM | POA: Diagnosis not present

## 2022-08-17 DIAGNOSIS — Z880 Allergy status to penicillin: Secondary | ICD-10-CM | POA: Diagnosis not present

## 2022-08-17 DIAGNOSIS — U071 COVID-19: Principal | ICD-10-CM | POA: Diagnosis present

## 2022-08-17 DIAGNOSIS — Z8249 Family history of ischemic heart disease and other diseases of the circulatory system: Secondary | ICD-10-CM | POA: Diagnosis not present

## 2022-08-17 DIAGNOSIS — J80 Acute respiratory distress syndrome: Secondary | ICD-10-CM | POA: Diagnosis not present

## 2022-08-17 DIAGNOSIS — D72829 Elevated white blood cell count, unspecified: Secondary | ICD-10-CM | POA: Diagnosis present

## 2022-08-17 DIAGNOSIS — K219 Gastro-esophageal reflux disease without esophagitis: Secondary | ICD-10-CM | POA: Diagnosis present

## 2022-08-17 DIAGNOSIS — K5900 Constipation, unspecified: Secondary | ICD-10-CM | POA: Diagnosis not present

## 2022-08-17 DIAGNOSIS — G20 Parkinson's disease: Secondary | ICD-10-CM | POA: Diagnosis present

## 2022-08-17 DIAGNOSIS — R0602 Shortness of breath: Secondary | ICD-10-CM | POA: Diagnosis not present

## 2022-08-17 DIAGNOSIS — J9601 Acute respiratory failure with hypoxia: Secondary | ICD-10-CM

## 2022-08-17 DIAGNOSIS — Z888 Allergy status to other drugs, medicaments and biological substances status: Secondary | ICD-10-CM | POA: Diagnosis not present

## 2022-08-17 DIAGNOSIS — Z6832 Body mass index (BMI) 32.0-32.9, adult: Secondary | ICD-10-CM | POA: Diagnosis not present

## 2022-08-17 DIAGNOSIS — R69 Illness, unspecified: Secondary | ICD-10-CM | POA: Diagnosis not present

## 2022-08-17 DIAGNOSIS — R0902 Hypoxemia: Secondary | ICD-10-CM | POA: Diagnosis not present

## 2022-08-17 DIAGNOSIS — I350 Nonrheumatic aortic (valve) stenosis: Secondary | ICD-10-CM | POA: Diagnosis not present

## 2022-08-17 DIAGNOSIS — F0280 Dementia in other diseases classified elsewhere without behavioral disturbance: Secondary | ICD-10-CM | POA: Diagnosis present

## 2022-08-17 LAB — BASIC METABOLIC PANEL
Anion gap: 12 (ref 5–15)
BUN: 14 mg/dL (ref 8–23)
CO2: 28 mmol/L (ref 22–32)
Calcium: 9.8 mg/dL (ref 8.9–10.3)
Chloride: 99 mmol/L (ref 98–111)
Creatinine, Ser: 0.79 mg/dL (ref 0.44–1.00)
GFR, Estimated: >60 mL/min (ref >60)
Glucose, Bld: 154 mg/dL — ABNORMAL HIGH (ref 70–99)
Potassium: 3.3 mmol/L — ABNORMAL LOW (ref 3.5–5.1)
Sodium: 139 mmol/L (ref 135–145)

## 2022-08-17 LAB — CBC
HCT: 45.2 % (ref 36.0–46.0)
Hemoglobin: 14.9 g/dL (ref 12.0–15.0)
MCH: 32.4 pg (ref 26.0–34.0)
MCHC: 33 g/dL (ref 30.0–36.0)
MCV: 98.3 fL (ref 80.0–100.0)
Platelets: 214 10*3/uL (ref 150–400)
RBC: 4.6 MIL/uL (ref 3.87–5.11)
RDW: 12.9 % (ref 11.5–15.5)
WBC: 7.6 10*3/uL (ref 4.0–10.5)
nRBC: 0 % (ref 0.0–0.2)

## 2022-08-17 LAB — TROPONIN I (HIGH SENSITIVITY)
Troponin I (High Sensitivity): 28 ng/L — ABNORMAL HIGH (ref <18)
Troponin I (High Sensitivity): 27 ng/L — ABNORMAL HIGH (ref <18)

## 2022-08-17 LAB — BRAIN NATRIURETIC PEPTIDE: B Natriuretic Peptide: 108.1 pg/mL — ABNORMAL HIGH (ref 0.0–100.0)

## 2022-08-17 LAB — MAGNESIUM: Magnesium: 2.3 mg/dL (ref 1.7–2.4)

## 2022-08-17 LAB — RESP PANEL BY RT-PCR (FLU A&B, COVID) ARPGX2
Influenza A by PCR: NEGATIVE
Influenza B by PCR: NEGATIVE
SARS Coronavirus 2 by RT PCR: POSITIVE — AB

## 2022-08-17 LAB — PROCALCITONIN: Procalcitonin: <0.1 ng/mL

## 2022-08-17 LAB — C-REACTIVE PROTEIN: CRP: 0.6 mg/dL

## 2022-08-17 LAB — LACTATE DEHYDROGENASE: LDH: 177 U/L (ref 98–192)

## 2022-08-17 LAB — D-DIMER, QUANTITATIVE: D-Dimer, Quant: 1.61 ug/mL-FEU — ABNORMAL HIGH (ref 0.00–0.50)

## 2022-08-17 MED ORDER — AMLODIPINE BESYLATE 5 MG PO TABS
5.0000 mg | ORAL_TABLET | Freq: Every day | ORAL | Status: DC
  Administered 2022-08-18 – 2022-08-19 (×2): 5 mg via ORAL
  Filled 2022-08-17 (×2): qty 1

## 2022-08-17 MED ORDER — HYDROCHLOROTHIAZIDE 12.5 MG PO CAPS: 12.5000 mg | ORAL_CAPSULE | Freq: Every day | ORAL | Status: DC

## 2022-08-17 MED ORDER — SODIUM CHLORIDE 0.9 % IV SOLN
200.0000 mg | Freq: Once | INTRAVENOUS | Status: DC
  Filled 2022-08-17: qty 40

## 2022-08-17 MED ORDER — ENTACAPONE 200 MG PO TABS
200.0000 mg | ORAL_TABLET | Freq: Every day | ORAL | Status: DC
  Administered 2022-08-17 – 2022-08-18 (×2): 200 mg via ORAL
  Filled 2022-08-17 (×2): qty 1

## 2022-08-17 MED ORDER — MAGNESIUM HYDROXIDE 400 MG/5ML PO SUSP: 30.0000 mL | Freq: Every day | ORAL | Status: DC | PRN

## 2022-08-17 MED ORDER — DEXAMETHASONE SODIUM PHOSPHATE 10 MG/ML IJ SOLN
6.0000 mg | Freq: Once | INTRAMUSCULAR | Status: DC
  Filled 2022-08-17: qty 1

## 2022-08-17 MED ORDER — OYSTER SHELL CALCIUM/D3 500-5 MG-MCG PO TABS
1.0000 | ORAL_TABLET | Freq: Every day | ORAL | Status: DC
Start: 2022-08-18 — End: 2022-08-19
  Administered 2022-08-18 – 2022-08-19 (×2): 1 via ORAL
  Filled 2022-08-17 (×2): qty 1

## 2022-08-17 MED ORDER — DONEPEZIL HCL 5 MG PO TABS
10.0000 mg | ORAL_TABLET | Freq: Every day | ORAL | Status: DC
  Administered 2022-08-17 – 2022-08-18 (×2): 10 mg via ORAL
  Filled 2022-08-17 (×2): qty 2

## 2022-08-17 MED ORDER — DULOXETINE HCL 30 MG PO CPEP
60.0000 mg | ORAL_CAPSULE | Freq: Every day | ORAL | Status: DC
  Administered 2022-08-18 – 2022-08-19 (×2): 60 mg via ORAL
  Filled 2022-08-17 (×2): qty 2

## 2022-08-17 MED ORDER — FAMOTIDINE 20 MG PO TABS
20.0000 mg | ORAL_TABLET | Freq: Two times a day (BID) | ORAL | Status: DC
Start: 2022-08-17 — End: 2022-08-19
  Administered 2022-08-17 – 2022-08-19 (×4): 20 mg via ORAL
  Filled 2022-08-17 (×4): qty 1

## 2022-08-17 MED ORDER — FOLIC ACID 1 MG PO TABS
1.0000 mg | ORAL_TABLET | Freq: Every day | ORAL | Status: DC
  Administered 2022-08-18 – 2022-08-19 (×2): 1 mg via ORAL
  Filled 2022-08-17 (×2): qty 1

## 2022-08-17 MED ORDER — NIRMATRELVIR/RITONAVIR (PAXLOVID)TABLET
3.0000 | ORAL_TABLET | Freq: Two times a day (BID) | ORAL | Status: DC
  Administered 2022-08-17 – 2022-08-19 (×4): 3 via ORAL
  Filled 2022-08-17 (×2): qty 30

## 2022-08-17 MED ORDER — GUAIFENESIN-DM 100-10 MG/5ML PO SYRP
10.0000 mL | ORAL_SOLUTION | ORAL | Status: DC | PRN
  Administered 2022-08-17 – 2022-08-18 (×2): 10 mL via ORAL
  Filled 2022-08-17 (×2): qty 10

## 2022-08-17 MED ORDER — MELATONIN 5 MG PO TABS
5.0000 mg | ORAL_TABLET | Freq: Every day | ORAL | Status: DC
  Administered 2022-08-17 – 2022-08-18 (×2): 5 mg via ORAL
  Filled 2022-08-17 (×2): qty 1

## 2022-08-17 MED ORDER — VITAMIN C 500 MG PO TABS: 500.0000 mg | ORAL_TABLET | Freq: Every day | ORAL | Status: DC

## 2022-08-17 MED ORDER — MEMANTINE HCL 5 MG PO TABS
10.0000 mg | ORAL_TABLET | Freq: Two times a day (BID) | ORAL | Status: DC
  Administered 2022-08-17 – 2022-08-19 (×4): 10 mg via ORAL
  Filled 2022-08-17 (×4): qty 2

## 2022-08-17 MED ORDER — CARBIDOPA-LEVODOPA 25-100 MG PO TABS
2.0000 | ORAL_TABLET | Freq: Every day | ORAL | Status: DC
  Administered 2022-08-17 – 2022-08-18 (×2): 2 via ORAL
  Filled 2022-08-17 (×2): qty 2

## 2022-08-17 MED ORDER — VITAMIN D 25 MCG (1000 UNIT) PO TABS
2000.0000 [IU] | ORAL_TABLET | Freq: Every day | ORAL | Status: DC
  Administered 2022-08-18 – 2022-08-19 (×2): 2000 [IU] via ORAL
  Filled 2022-08-17 (×2): qty 2

## 2022-08-17 MED ORDER — SODIUM CHLORIDE 0.9 % IV SOLN: 100.0000 mg | Freq: Every day | INTRAVENOUS | Status: DC

## 2022-08-17 MED ORDER — ONDANSETRON HCL 4 MG/2ML IJ SOLN: 4.0000 mg | Freq: Four times a day (QID) | INTRAMUSCULAR | Status: DC | PRN

## 2022-08-17 MED ORDER — TRAZODONE HCL 50 MG PO TABS
25.0000 mg | ORAL_TABLET | Freq: Every evening | ORAL | Status: DC | PRN
  Administered 2022-08-17: 25 mg via ORAL
  Filled 2022-08-17: qty 1

## 2022-08-17 MED ORDER — POLYETHYLENE GLYCOL 3350 17 G PO PACK: 17.0000 g | PACK | Freq: Every day | ORAL | Status: DC | PRN

## 2022-08-17 MED ORDER — ZINC SULFATE 220 (50 ZN) MG PO CAPS
220.0000 mg | ORAL_CAPSULE | Freq: Every day | ORAL | Status: DC
Start: 2022-08-17 — End: 2022-08-19
  Administered 2022-08-17 – 2022-08-19 (×3): 220 mg via ORAL
  Filled 2022-08-17 (×3): qty 1

## 2022-08-17 MED ORDER — CARBIDOPA-LEVODOPA 25-100 MG PO TABS
2.0000 | ORAL_TABLET | Freq: Three times a day (TID) | ORAL | Status: DC
Start: 2022-08-17 — End: 2022-08-19
  Administered 2022-08-17 – 2022-08-19 (×4): 2 via ORAL
  Filled 2022-08-17 (×7): qty 2

## 2022-08-17 MED ORDER — HYDROCODONE-ACETAMINOPHEN 7.5-325 MG PO TABS
1.0000 | ORAL_TABLET | Freq: Four times a day (QID) | ORAL | Status: DC | PRN
  Administered 2022-08-17 – 2022-08-18 (×2): 1 via ORAL
  Filled 2022-08-17 (×2): qty 1

## 2022-08-17 MED ORDER — ATORVASTATIN CALCIUM 20 MG PO TABS
20.0000 mg | ORAL_TABLET | Freq: Every day | ORAL | Status: DC
Start: 2022-08-17 — End: 2022-08-17

## 2022-08-17 MED ORDER — LISINOPRIL 10 MG PO TABS
5.0000 mg | ORAL_TABLET | Freq: Every day | ORAL | Status: DC
  Administered 2022-08-18 – 2022-08-19 (×2): 5 mg via ORAL
  Filled 2022-08-17 (×2): qty 1

## 2022-08-17 MED ORDER — CARBIDOPA-LEVODOPA-ENTACAPONE 50-200-200 MG PO TABS: 1.0000 | ORAL_TABLET | Freq: Every day | ORAL | Status: DC

## 2022-08-17 MED ORDER — ADULT MULTIVITAMIN W/MINERALS CH
1.0000 | ORAL_TABLET | Freq: Every day | ORAL | Status: DC
  Administered 2022-08-18 – 2022-08-19 (×2): 1 via ORAL
  Filled 2022-08-17 (×2): qty 1

## 2022-08-17 MED ORDER — HYDROCOD POLI-CHLORPHE POLI ER 10-8 MG/5ML PO SUER
5.0000 mL | Freq: Two times a day (BID) | ORAL | Status: DC | PRN
  Administered 2022-08-17: 5 mL via ORAL
  Filled 2022-08-17: qty 5

## 2022-08-17 MED ORDER — GABAPENTIN 600 MG PO TABS: 300.0000 mg | ORAL_TABLET | Freq: Two times a day (BID) | ORAL | Status: DC

## 2022-08-17 MED ORDER — VITAMIN E 45 MG (100 UNIT) PO CAPS
400.0000 [IU] | ORAL_CAPSULE | Freq: Every day | ORAL | Status: DC
  Administered 2022-08-18 – 2022-08-19 (×2): 400 [IU] via ORAL
  Filled 2022-08-17 (×2): qty 4

## 2022-08-17 MED ORDER — VITAMIN C 500 MG PO TABS
1000.0000 mg | ORAL_TABLET | Freq: Every day | ORAL | Status: DC
  Administered 2022-08-18 – 2022-08-19 (×2): 1000 mg via ORAL
  Filled 2022-08-17 (×2): qty 2

## 2022-08-17 MED ORDER — POTASSIUM CHLORIDE 20 MEQ PO PACK
40.0000 meq | PACK | Freq: Once | ORAL | Status: AC
  Administered 2022-08-17: 40 meq via ORAL
  Filled 2022-08-17: qty 2

## 2022-08-17 MED ORDER — FESOTERODINE FUMARATE ER 4 MG PO TB24
4.0000 mg | ORAL_TABLET | Freq: Every day | ORAL | Status: DC
  Administered 2022-08-18 – 2022-08-19 (×2): 4 mg via ORAL
  Filled 2022-08-17 (×2): qty 1

## 2022-08-17 MED ORDER — ONDANSETRON HCL 4 MG PO TABS: 4.0000 mg | ORAL_TABLET | Freq: Four times a day (QID) | ORAL | Status: DC | PRN

## 2022-08-17 MED ORDER — METHYLPREDNISOLONE SODIUM SUCC 125 MG IJ SOLR
1.0000 mg/kg | Freq: Two times a day (BID) | INTRAMUSCULAR | Status: DC
  Administered 2022-08-17 – 2022-08-19 (×4): 85 mg via INTRAVENOUS
  Filled 2022-08-17 (×4): qty 2

## 2022-08-17 MED ORDER — PREDNISONE 10 MG PO TABS: 50.0000 mg | ORAL_TABLET | Freq: Every day | ORAL | Status: DC

## 2022-08-17 MED ORDER — PREGABALIN 75 MG PO CAPS
150.0000 mg | ORAL_CAPSULE | Freq: Two times a day (BID) | ORAL | Status: DC
  Administered 2022-08-17 – 2022-08-19 (×4): 150 mg via ORAL
  Filled 2022-08-17 (×4): qty 2

## 2022-08-17 MED ORDER — SODIUM CHLORIDE 0.9 % IV SOLN
INTRAVENOUS | Status: DC
Start: 2022-08-17 — End: 2022-08-18

## 2022-08-17 MED ORDER — METHOTREXATE 2.5 MG PO TABS: 15.0000 mg | ORAL_TABLET | ORAL | Status: DC

## 2022-08-17 MED ORDER — ENOXAPARIN SODIUM 40 MG/0.4ML IJ SOSY
40.0000 mg | PREFILLED_SYRINGE | INTRAMUSCULAR | Status: DC
  Administered 2022-08-17 – 2022-08-18 (×2): 40 mg via SUBCUTANEOUS
  Filled 2022-08-17 (×2): qty 0.4

## 2022-08-17 NOTE — ED Provider Notes (Signed)
[facility] Provider Note    Event Date/Time   First MD Initiated Contact with Patient 08/17/22 1311     (approximate)   History   Shortness of Breath   HPI  Morgan Dennis is a 88 y.o. female  who presents to the emergency department from living facility because of concern for low oxygen levels.  Patient does have history of dementia and cannot give any significant history.  History is obtained from family at bedside.  They state that she is supposed to be on 2 L of oxygen at all times although she does not always keep it on.  Today at living facility her oxygen level is dropping into the 80s.  Again they were not sure if she was on her oxygen.  One of the family members had visited her yesterday and had not noticed any unusual activity or that the patient was complaining of any unusual symptoms.  The time my exam the patient denies any shortness of breath or chest pain.  Physical Exam   Triage Vital Signs: ED Triage Vitals  Enc Vitals Group     BP 08/17/22 1116 (!) 100/55     Pulse Rate 08/17/22 1116 94     Resp 08/17/22 1116 16     Temp 08/17/22 1116 98.8 F (37.1 C)     Temp Source 08/17/22 1116 Oral     SpO2 08/17/22 1116 96 %     Weight --      Height --      Head Circumference --      Peak Flow --      Pain Score 08/17/22 1108 0     Pain Loc --      Pain Edu? --      Excl. in [place]? --     Most recent vital signs: Vitals:   08/17/22 1116  BP: (!) 100/55  Pulse: 94  Resp: 16  Temp: 98.8 F (37.1 C)  SpO2: 96%   General: Awake, alert, not completely oriented. CV:  Good peripheral perfusion. Regular rate and rhythm Resp:  Normal effort. Lungs clear. Abd:  No distention.   ED Results / Procedures / Treatments   Labs (all labs ordered are listed, but only abnormal results are displayed) Labs Reviewed  RESP PANEL BY RT-PCR (FLU A&B, COVID) ARPGX2 - Abnormal; Notable for the following components:      Result Value   SARS Coronavirus 2  by RT PCR POSITIVE (*)    All other components within normal limits  BASIC METABOLIC PANEL - Abnormal; Notable for the following components:   Potassium 3.3 (*)    Glucose, Bld 154 (*)    All other components within normal limits  TROPONIN I (HIGH SENSITIVITY) - Abnormal; Notable for the following components:   Troponin I (High Sensitivity) 28 (*)    All other components within normal limits  CBC  TROPONIN I (HIGH SENSITIVITY)     EKG  I, [name], attending physician, personally viewed and interpreted this EKG  EKG Time: 1115 Rate: 94 Rhythm: normal sinus rhythm Axis: normal Intervals: qtc 470 QRS: narrow, q waves v1 ST changes: no st elevation Impression: abnormal ekg    RADIOLOGY I independently interpreted and visualized the CXR. My interpretation: No pneumonia Radiology interpretation:  IMPRESSION:  Chronic bronchitic changes without acute abnormalities.    Aortic Atherosclerosis (ICD10-I70.0).      PROCEDURES:  Critical Care performed: No  Procedures   MEDICATIONS ORDERED IN ED:  Medications - No data to display   IMPRESSION / MDM / [place] / ED COURSE  I reviewed the triage vital signs and the nursing notes.                              Differential diagnosis includes, but is not limited to, pneumonia, pneumothorax, anemia, COVID.  Patient's presentation is most consistent with acute presentation with potential threat to life or bodily function.  Patient presented to the emergency department today because of concerns for shortness of breath.  Patient is supposed to be on oxygen at baseline however family states that she has a hard time keeping it on.  Patient was able to be put on her 2 L of oxygen here.  Chest x-ray without concerning findings for pneumonia.  Blood work without concerning anemia.  COVID did come back positive.  I do think this would explain the patient's shortness of breath.  Family does have concern about patient  maintaining her oxygen at the living facility.  Will plan on bringing patient to the hospital for further work-up and management.  Discussed with Dr. Sidney Ace with the hospitalist service will plan on admission.  FINAL CLINICAL IMPRESSION(S) / ED DIAGNOSES   Final diagnoses:  COVID-19      Note:  This document was prepared using Dragon voice recognition software and may include unintentional dictation errors.    [name], MD 08/17/22 [phone]

## 2022-08-17 NOTE — Progress Notes (Signed)
Remdesivir - Pharmacy Brief Note   O:  ALT: n/a CXR: chronic bronchitic changes; no acute abnormalities SpO2: 89% then placed on 4L with follow up O2 sat 97%  Notably, patient is also on immunosuppressants PTA (Orencia and methrotrexate). Also has    A/P:  Remdesivir 200 mg IVPB once followed by 100 mg IVPB daily x 4 days.   Glean Salvo, PharmD Clinical Pharmacist  08/17/2022 3:14 PM

## 2022-08-17 NOTE — Assessment & Plan Note (Signed)
-   The patient will be admitted to a medical telemetry bed. - We will place on p.o. Paxlovid. - Mucolytic therapy will be provided as well as O2 protocol. - IV steroid therapy will be given with Solu-Medrol. - We will follow inflammatory markers. - Be placed on vitamin C, vitamin D3 and zinc sulfate.

## 2022-08-17 NOTE — Assessment & Plan Note (Signed)
-  We will replace potassium and check magnesium level. 

## 2022-08-17 NOTE — Assessment & Plan Note (Signed)
-   We will continue statin therapy. 

## 2022-08-17 NOTE — ED Triage Notes (Signed)
Pt comes via EMS from [facility]. Facility reports pt has been stating sob when ambulating. Pt's O2 was 84 % per doc. Pt was placed on O2 at 4L and was 97%. Pt denies any SOB.  EMs reports pt was 92 % RA and they placed her on 2L and she is now at 95%. BP-126/99 HR-95

## 2022-08-17 NOTE — Assessment & Plan Note (Signed)
-   We will continue her antihypertensives. 

## 2022-08-17 NOTE — Assessment & Plan Note (Signed)
We will continue Sinemet. 
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
## 2022-08-18 ENCOUNTER — Encounter: Payer: Self-pay | Admitting: Family Medicine

## 2022-08-18 DIAGNOSIS — I10 Essential (primary) hypertension: Secondary | ICD-10-CM | POA: Diagnosis not present

## 2022-08-18 DIAGNOSIS — U071 COVID-19: Secondary | ICD-10-CM | POA: Diagnosis not present

## 2022-08-18 DIAGNOSIS — E876 Hypokalemia: Secondary | ICD-10-CM | POA: Diagnosis not present

## 2022-08-18 DIAGNOSIS — G20 Parkinson's disease: Secondary | ICD-10-CM | POA: Diagnosis not present

## 2022-08-18 LAB — COMPREHENSIVE METABOLIC PANEL
ALT: 9 U/L (ref 0–44)
AST: 41 U/L (ref 15–41)
Albumin: 3.6 g/dL (ref 3.5–5.0)
Alkaline Phosphatase: 63 U/L (ref 38–126)
Anion gap: 6 (ref 5–15)
BUN: 16 mg/dL (ref 8–23)
CO2: 27 mmol/L (ref 22–32)
Calcium: 9 mg/dL (ref 8.9–10.3)
Chloride: 104 mmol/L (ref 98–111)
Creatinine, Ser: 0.63 mg/dL (ref 0.44–1.00)
GFR, Estimated: >60 mL/min (ref >60)
Glucose, Bld: 150 mg/dL — ABNORMAL HIGH (ref 70–99)
Potassium: 4.1 mmol/L (ref 3.5–5.1)
Sodium: 137 mmol/L (ref 135–145)
Total Bilirubin: 1.1 mg/dL (ref 0.3–1.2)
Total Protein: 6.7 g/dL (ref 6.5–8.1)

## 2022-08-18 LAB — CBC WITH DIFFERENTIAL/PLATELET
Abs Immature Granulocytes: 0.02 10*3/uL (ref 0.00–0.07)
Basophils Absolute: 0 10*3/uL (ref 0.0–0.1)
Basophils Relative: 0 %
Eosinophils Absolute: 0 10*3/uL (ref 0.0–0.5)
Eosinophils Relative: 0 %
HCT: 40.9 % (ref 36.0–46.0)
Hemoglobin: 13.6 g/dL (ref 12.0–15.0)
Immature Granulocytes: 0 %
Lymphocytes Relative: 22 %
Lymphs Abs: 1.3 10*3/uL (ref 0.7–4.0)
MCH: 32.8 pg (ref 26.0–34.0)
MCHC: 33.3 g/dL (ref 30.0–36.0)
MCV: 98.6 fL (ref 80.0–100.0)
Monocytes Absolute: 0.1 10*3/uL (ref 0.1–1.0)
Monocytes Relative: 1 %
Neutro Abs: 4.6 10*3/uL (ref 1.7–7.7)
Neutrophils Relative %: 77 %
Platelets: 174 10*3/uL (ref 150–400)
RBC: 4.15 MIL/uL (ref 3.87–5.11)
RDW: 12.5 % (ref 11.5–15.5)
WBC: 6 10*3/uL (ref 4.0–10.5)
nRBC: 0 % (ref 0.0–0.2)

## 2022-08-18 LAB — D-DIMER, QUANTITATIVE: D-Dimer, Quant: 1.52 ug/mL-FEU — ABNORMAL HIGH (ref 0.00–0.50)

## 2022-08-18 LAB — C-REACTIVE PROTEIN: CRP: 0.5 mg/dL (ref <1.0)

## 2022-08-18 LAB — FERRITIN: Ferritin: 213 ng/mL (ref 11–307)

## 2022-08-18 LAB — MRSA NEXT GEN BY PCR, NASAL: MRSA by PCR Next Gen: NOT DETECTED

## 2022-08-18 NOTE — Progress Notes (Addendum)
Progress Note    Morgan Dennis  ONG:[id number] DOB: 09-Feb-1934  DOA: 08/17/2022 PCP: Jerrol Banana., MD      Brief Narrative:    Medical records reviewed and are as summarized below:  Morgan Dennis is a 88 y.o. female with medical history significant for Parkinson's disease, dementia, restless leg syndrome, rheumatoid arthritis, dyslipidemia, GERD, chronic hypoxic respiratory failure on home oxygen at nighttime (but not medically adherent with oxygen) who presented to the hospital (from ALF) because of shortness of breath and low oxygen saturation (78% on 2 L/min oxygen).  She tested positive for COVID-19 infection.  She has had 2 vaccine injections for COVID-19 in the past.  She was admitted to the hospital for [id number] infection complicated by acute on chronic hypoxic respiratory failure.  She was treated with Paxlovid and steroids.  She was placed on 2 L/min oxygen via nasal cannula.      Assessment/Plan:   Principal Problem:   Acute hypoxemic respiratory failure due to COVID-19 [facility]) Active Problems:   Hypokalemia   Parkinson's disease ([place])   Essential hypertension   Dyslipidemia   Body mass index is 32.13 kg/m.  (Obesity)   COVID-19 infection: Continue Paxlovid and IV Solu-Medrol. Discontinue IV fluids  Acute on chronic hypoxic respiratory failure: She is on 1.5 L/min oxygen via nasal cannula.  Parkinson's disease: Continue Sinemet, entacapone  Acute metabolic encephalopathy with underlying dementia: Continue donepezil and memantine.  Continue supportive care  Hypokalemia: Improved  Other comorbidities include rheumatoid arthritis on methotrexate, hypertension, dyslipidemia, GERD  Diet Order             Diet Heart Room service appropriate? Yes; Fluid consistency: Thin  Diet effective now                            Consultants: None  Procedures: None    Medications:    amLODipine  5 mg Oral Daily   ascorbic acid  1,000  mg Oral Daily   calcium-vitamin D  1 tablet Oral Q breakfast   carbidopa-levodopa  2 tablet Oral TID   carbidopa-levodopa  2 tablet Oral QHS   And   entacapone  200 mg Oral QHS   cholecalciferol  2,000 Units Oral Daily   donepezil  10 mg Oral QHS   DULoxetine  60 mg Oral Daily   enoxaparin (LOVENOX) injection  40 mg Subcutaneous Q24H   famotidine  20 mg Oral BID   fesoterodine  4 mg Oral Daily   folic acid  1 mg Oral Daily   lisinopril  5 mg Oral Daily   melatonin  5 mg Oral QHS   memantine  10 mg Oral BID   [START ON 08/23/2022] methotrexate  15 mg Oral Q Wed   methylPREDNISolone (SOLU-MEDROL) injection  1 mg/kg Intravenous Q12H   Followed by   Derrill Memo ON 08/20/2022] predniSONE  50 mg Oral Daily   multivitamin with minerals  1 tablet Oral Daily   nirmatrelvir/ritonavir EUA  3 tablet Oral BID   pregabalin  150 mg Oral BID   vitamin E  400 Units Oral Daily   zinc sulfate  220 mg Oral Daily   Continuous Infusions:  sodium chloride 100 mL/hr at 08/18/22 1005     Anti-infectives (From admission, onward)    Start     Dose/Rate Route Frequency Ordered Stop   08/18/22 1000  remdesivir 100 mg in sodium chloride 0.9 % 100  mL IVPB  Status:  Discontinued       See Hyperspace for full Linked Orders Report.   100 mg 200 mL/hr over 30 Minutes Intravenous Daily 08/17/22 1519 08/17/22 1536   08/17/22 2200  nirmatrelvir/ritonavir EUA (PAXLOVID) 3 tablet        3 tablet Oral 2 times daily 08/17/22 1548 08/22/22 2159   08/17/22 1530  remdesivir 200 mg in sodium chloride 0.9% 250 mL IVPB  Status:  Discontinued       See Hyperspace for full Linked Orders Report.   200 mg 580 mL/hr over 30 Minutes Intravenous Once 08/17/22 1519 08/17/22 1536              Family Communication/Anticipated D/C date and plan/Code Status   DVT prophylaxis: enoxaparin (LOVENOX) injection 40 mg Start: 08/17/22 2200     Code Status: DNR  Family Communication: Discussed with her son at the  bedside Disposition Plan: Plan to discharge home in 1 to 2 days   Status is: Inpatient Remains inpatient appropriate because: Change in mental status, COVID-19 infection       Subjective:   Interval events noted.  She has been intermittently confused.  She has a Actuary at the bedside.  Her son, Morgan Dennis, was also at the bedside.  Objective:    Vitals:   08/17/22 1951 08/18/22 0652 08/18/22 0815 08/18/22 1121  BP: 125/60 110/85 (!) 150/80 (!) 135/93  Pulse: 91 90 85 95  Resp: '18 20 18 17  '$ Temp: 98.2 F (36.8 C) 97.9 F (36.6 C) 98.2 F (36.8 C) 98 F (36.7 C)  TempSrc: Oral Oral Oral   SpO2: 96% 94% 100% 95%  Weight:      Height:       No data found.   Intake/Output Summary (Last 24 hours) at 08/18/2022 1211 Last data filed at 08/18/2022 0604 Gross per 24 hour  Intake 469.19 ml  Output --  Net 469.19 ml   Filed Weights   08/17/22 1600  Weight: 84.9 kg    Exam:  GEN: NAD SKIN: Warm and dry EYES: EOMI ENT: MMM CV: RRR PULM: CTA B ABD: soft, ND, NT, +BS CNS: AAO x 1 (person), non focal EXT: No edema or tenderness        Data Reviewed:   I have personally reviewed following labs and imaging studies:  Labs: Labs show the following:   Basic Metabolic Panel: Recent Labs  Lab 08/17/22 1122 08/17/22 1449 08/18/22 0420  NA 139  --  137  K 3.3*  --  4.1  CL 99  --  104  CO2 28  --  27  GLUCOSE 154*  --  150*  BUN 14  --  16  CREATININE 0.79  --  0.63  CALCIUM 9.8  --  9.0  MG  --  2.3  --    GFR Estimated Creatinine Clearance: 54.2 mL/min (by C-G formula based on SCr of 0.63 mg/dL). Liver Function Tests: Recent Labs  Lab 08/18/22 0420  AST 41  ALT 9  ALKPHOS 63  BILITOT 1.1  PROT 6.7  ALBUMIN 3.6   No results for input(s): "LIPASE", "AMYLASE" in the last 168 hours. No results for input(s): "AMMONIA" in the last 168 hours. Coagulation profile No results for input(s): "INR", "PROTIME" in the last 168 hours.  CBC: Recent Labs  Lab  08/17/22 1122 08/18/22 0420  WBC 7.6 6.0  NEUTROABS  --  4.6  HGB 14.9 13.6  HCT 45.2 40.9  MCV 98.3  98.6  PLT 214 174   Cardiac Enzymes: No results for input(s): "CKTOTAL", "CKMB", "CKMBINDEX", "TROPONINI" in the last 168 hours. BNP (last 3 results) No results for input(s): "PROBNP" in the last 8760 hours. CBG: No results for input(s): "GLUCAP" in the last 168 hours. D-Dimer: Recent Labs    08/17/22 1918 08/18/22 0420  DDIMER 1.61* 1.52*   Hgb A1c: No results for input(s): "HGBA1C" in the last 72 hours. Lipid Profile: No results for input(s): "CHOL", "HDL", "LDLCALC", "TRIG", "CHOLHDL", "LDLDIRECT" in the last 72 hours. Thyroid function studies: No results for input(s): "TSH", "T4TOTAL", "T3FREE", "THYROIDAB" in the last 72 hours.  Invalid input(s): "FREET3" Anemia work up: Recent Labs    08/18/22 0420  FERRITIN 213   Sepsis Labs: Recent Labs  Lab 08/17/22 1122 08/17/22 1918 08/18/22 0420  PROCALCITON  --  <0.10  --   WBC 7.6  --  6.0    Microbiology Recent Results (from the past 240 hour(s))  Resp Panel by RT-PCR (Flu A&B, Covid) Anterior Nasal Swab     Status: Abnormal   Collection Time: 08/17/22  1:53 PM   Specimen: Anterior Nasal Swab  Result Value Ref Range Status   SARS Coronavirus 2 by RT PCR POSITIVE (A) NEGATIVE Final    Comment: (NOTE) SARS-CoV-2 target nucleic acids are DETECTED.  The SARS-CoV-2 RNA is generally detectable in upper respiratory specimens during the acute phase of infection. Positive results are indicative of the presence of the identified virus, but do not rule out bacterial infection or co-infection with other pathogens not detected by the test. Clinical correlation with patient history and other diagnostic information is necessary to determine patient infection status. The expected result is Negative.  Fact Sheet for Patients: EntrepreneurPulse.com.au  Fact Sheet for Healthcare  Providers: IncredibleEmployment.be  This test is not yet approved or cleared by the [place] FDA and  has been authorized for detection and/or diagnosis of SARS-CoV-2 by FDA under an Emergency Use Authorization (EUA).  This EUA will remain in effect (meaning this test can be used) for the duration of  the COVID-19 declaration under Section 564(b)(1) of the A ct, 21 U.S.C. section 360bbb-3(b)(1), unless the authorization is terminated or revoked sooner.     Influenza A by PCR NEGATIVE NEGATIVE Final   Influenza B by PCR NEGATIVE NEGATIVE Final    Comment: (NOTE) The Xpert Xpress SARS-CoV-2/FLU/RSV plus assay is intended as an aid in the diagnosis of influenza from Nasopharyngeal swab specimens and should not be used as a sole basis for treatment. Nasal washings and aspirates are unacceptable for Xpert Xpress SARS-CoV-2/FLU/RSV testing.  Fact Sheet for Patients: EntrepreneurPulse.com.au  Fact Sheet for Healthcare Providers: IncredibleEmployment.be  This test is not yet approved or cleared by the [place] FDA and has been authorized for detection and/or diagnosis of SARS-CoV-2 by FDA under an Emergency Use Authorization (EUA). This EUA will remain in effect (meaning this test can be used) for the duration of the COVID-19 declaration under Section 564(b)(1) of the Act, 21 U.S.C. section 360bbb-3(b)(1), unless the authorization is terminated or revoked.  Performed at [facility], [address] [place], [place] [address]     Procedures and diagnostic studies:  DG Chest 2 View  Result Date: 08/17/2022 CLINICAL DATA:  Shortness of breath while ambulating EXAM: CHEST - 2 VIEW COMPARISON:  12/09/2019 FINDINGS: Normal heart size and pulmonary vascularity. Atherosclerotic calcification and tortuosity of thoracic aorta. Peribronchial thickening and chronic accentuation of perihilar markings question bronchitis.  Eventration anterior  RIGHT diaphragm. No acute infiltrate, pleural effusion, or pneumothorax. Prominent RIGHT first costochondral junction. Osseous demineralization with prior vertebroplasty at upper lumbar spine IMPRESSION: Chronic bronchitic changes without acute abnormalities. Aortic Atherosclerosis (ICD10-I70.0). Electronically Signed   By: Lavonia Dana M.D.   On: 08/17/2022 12:06               LOS: 1 day   Morgan Dennis  [facility]   Pager on [email or web address]. If 7PM-7AM, please contact night-coverage at [email or web address]     08/18/2022, 12:12 PM

## 2022-08-18 NOTE — Progress Notes (Addendum)
Pt. Unsteady and repeatedly getting out of bed without assistance despite being instructed to call out for help. Pt. Impulsive and pulling at IV site. Sharion Settler, NP notified of need for sitter.

## 2022-08-18 NOTE — TOC Initial Note (Addendum)
Transition of Care [facility]) - Initial/Assessment Note    Patient Details  Name: Morgan Dennis MRN: [id number] Date of Birth: 1934/02/23  Transition of Care [facility]) CM/SW Contact:    Candie Chroman, LCSW Phone Number: 08/18/2022, 12:43 PM  Clinical Narrative: Per chart review, patient admitted from [facility]. CSW called and spoke to Marine at [facility] [employer]. She confirmed patient is an ALF resident. She thinks patient is still active with [facility]. Left message for [facility] representative to confirm and see what services patient is receiving. Patient uses a rollator to get around at the facility. She is on 2 L QHS oxygen. Patient is COVID positive. She does not have to quarantine here before returning, she can finish quarantine in her room when she returns. She does not have to be sitter-free prior to return. [facility] said if she does not discharge today, they will not be able to accept her back until Monday due to their pharmacy being closed. [facility] said their pharmacy will be open Monday even though it is a holiday. No further concerns. CSW encouraged patient to contact CSW as needed. CSW will continue to follow patient for support and facilitate return to ALF once stable.      3:07 pm: Patient is active with [facility] for RN, PT, and speech.             4:02 pm: Per MD, son [name] is agreeable to discharge tomorrow and said he can pick up her prescriptions at another pharmacy. Called [facility] and confirmed this was okay.  Expected Discharge Plan: Assisted Living (with home health) Barriers to Discharge: Continued Medical Work up   Patient Goals and CMS Choice     Choice offered to / list presented to : NA  Expected Discharge Plan and Services Expected Discharge Plan: Assisted Living (with home health)     Post Acute Care Choice: Resumption of Svcs/PTA Provider Living arrangements for the past 2 months: [place]                                      Prior  Living Arrangements/Services Living arrangements for the past 2 months: [employer] Lives with:: Facility Resident Patient language and need for interpreter reviewed:: Yes        Need for Family Participation in Patient Care: Yes (Comment) Care giver support system in place?: Yes (comment) Current home services: DME Criminal Activity/Legal Involvement Pertinent to Current Situation/Hospitalization: No - Comment as needed  Activities of Daily Living Home Assistive Devices/Equipment: Gilford Rile (specify type) ADL Screening (condition at time of admission) Patient's cognitive ability adequate to safely complete daily activities?: Yes Is the patient deaf or have difficulty hearing?: No Does the patient have difficulty seeing, even when wearing glasses/contacts?: No Does the patient have difficulty concentrating, remembering, or making decisions?: No Patient able to express need for assistance with ADLs?: Yes Does the patient have difficulty dressing or bathing?: No Independently performs ADLs?: No Communication: Independent Dressing (OT): Needs assistance Is this a change from baseline?: Pre-admission baseline Grooming: Independent Feeding: Independent Bathing: Needs assistance Is this a change from baseline?: Pre-admission baseline Toileting: Independent In/Out Bed: Independent Walks in Home: Independent with device (comment) (walker) Does the patient have difficulty walking or climbing stairs?: No Weakness of Legs: Both Weakness of Arms/Hands: None  Permission Sought/Granted         Permission granted to share  info w AGENCY: [facility]        Emotional Assessment   Attitude/Demeanor/Rapport: Unable to Assess Affect (typically observed): Unable to Assess Orientation: : Oriented to Self Alcohol / Substance Use: Not Applicable Psych Involvement: No (comment)  Admission diagnosis:  Acute hypoxemic respiratory failure due to COVID-19 ([place]) [U07.1,  J96.01] COVID-19 [U07.1] Patient Active Problem List   Diagnosis Date Noted   Acute hypoxemic respiratory failure due to COVID-19 [facility]) 08/17/2022   Hypokalemia 08/17/2022   Essential hypertension 08/17/2022   Dyslipidemia 08/17/2022   Hearing deficit 05/04/2021   Acute respiratory failure due to COVID-19 ([place]) 12/10/2019   Acute respiratory failure with hypoxia ([place]) 10/19/2019   Thrombocytopenia ([place]) 10/19/2019   Dementia without behavioral disturbance ([place]) 10/19/2019   Aortic atherosclerosis ([place]) 10/19/2019   Right upper lobe pneumonia 10/17/2019   Sepsis ([place]) 06/16/2019   Parkinson's disease ([place]) 04/23/2017   Skin cancer 02/12/2017   Chronic bronchitis ([place]) 01/02/2017   Frequency of urination and polyuria 10/12/2015   Upper GI bleed 08/18/2015   Adaptation reaction 04/30/2015   Malaise and fatigue 04/30/2015   Affective disorder, major 04/30/2015   Bad memory 04/30/2015   Mild cognitive disorder 04/30/2015   Fungal infection of toenail 04/30/2015   Arthritis, degenerative 04/30/2015   OP (osteoporosis) 04/30/2015   Arthritis or polyarthritis, rheumatoid ([place]) 04/30/2015   Personal history of other malignant neoplasm of skin 11/16/2014   Avitaminosis D 05/12/2014   Trigger finger 05/12/2014   Rheumatoid arthritis ([place]) 03/17/2014   Osteoporosis, post-menopausal 03/17/2014   PCP:  Jerrol Banana., MD Pharmacy:   [facility] [address] [place] [address] Phone: [phone] Fax: [place], [place] [address] [address] Phone: [phone] Fax: [place], [place] [address] Phone: [phone] Fax: [phone]     Social Determinants of Health (SDOH) Interventions    Readmission Risk Interventions     No data to display

## 2022-08-18 NOTE — Progress Notes (Signed)
Order given from Dr Mal Misty to discontinue telemetry

## 2022-08-19 ENCOUNTER — Inpatient Hospital Stay: Payer: Medicare HMO

## 2022-08-19 DIAGNOSIS — J9621 Acute and chronic respiratory failure with hypoxia: Secondary | ICD-10-CM

## 2022-08-19 DIAGNOSIS — Z743 Need for continuous supervision: Secondary | ICD-10-CM | POA: Diagnosis not present

## 2022-08-19 DIAGNOSIS — R279 Unspecified lack of coordination: Secondary | ICD-10-CM | POA: Diagnosis not present

## 2022-08-19 DIAGNOSIS — G20 Parkinson's disease: Secondary | ICD-10-CM | POA: Diagnosis not present

## 2022-08-19 DIAGNOSIS — R0602 Shortness of breath: Secondary | ICD-10-CM | POA: Diagnosis not present

## 2022-08-19 DIAGNOSIS — E876 Hypokalemia: Secondary | ICD-10-CM | POA: Diagnosis not present

## 2022-08-19 DIAGNOSIS — U071 COVID-19: Secondary | ICD-10-CM | POA: Diagnosis not present

## 2022-08-19 LAB — CBC WITH DIFFERENTIAL/PLATELET
Abs Immature Granulocytes: 0.17 10*3/uL — ABNORMAL HIGH (ref 0.00–0.07)
Basophils Absolute: 0 10*3/uL (ref 0.0–0.1)
Basophils Relative: 0 %
Eosinophils Absolute: 0 10*3/uL (ref 0.0–0.5)
Eosinophils Relative: 0 %
HCT: 39 % (ref 36.0–46.0)
Hemoglobin: 13.2 g/dL (ref 12.0–15.0)
Immature Granulocytes: 1 %
Lymphocytes Relative: 6 %
Lymphs Abs: 1.4 10*3/uL (ref 0.7–4.0)
MCH: 33.2 pg (ref 26.0–34.0)
MCHC: 33.8 g/dL (ref 30.0–36.0)
MCV: 98.2 fL (ref 80.0–100.0)
Monocytes Absolute: 0.5 10*3/uL (ref 0.1–1.0)
Monocytes Relative: 2 %
Neutro Abs: 22.8 10*3/uL — ABNORMAL HIGH (ref 1.7–7.7)
Neutrophils Relative %: 91 %
Platelets: 176 10*3/uL (ref 150–400)
RBC: 3.97 MIL/uL (ref 3.87–5.11)
RDW: 12.5 % (ref 11.5–15.5)
WBC: 24.9 10*3/uL — ABNORMAL HIGH (ref 4.0–10.5)
nRBC: 0 % (ref 0.0–0.2)

## 2022-08-19 LAB — D-DIMER, QUANTITATIVE: D-Dimer, Quant: 1.36 ug/mL-FEU — ABNORMAL HIGH (ref 0.00–0.50)

## 2022-08-19 LAB — COMPREHENSIVE METABOLIC PANEL
ALT: 8 U/L (ref 0–44)
AST: 31 U/L (ref 15–41)
Albumin: 3.5 g/dL (ref 3.5–5.0)
Alkaline Phosphatase: 71 U/L (ref 38–126)
Anion gap: 5 (ref 5–15)
BUN: 21 mg/dL (ref 8–23)
CO2: 28 mmol/L (ref 22–32)
Calcium: 8.9 mg/dL (ref 8.9–10.3)
Chloride: 106 mmol/L (ref 98–111)
Creatinine, Ser: 0.64 mg/dL (ref 0.44–1.00)
GFR, Estimated: >60 mL/min (ref >60)
Glucose, Bld: 153 mg/dL — ABNORMAL HIGH (ref 70–99)
Potassium: 4.1 mmol/L (ref 3.5–5.1)
Sodium: 139 mmol/L (ref 135–145)
Total Bilirubin: 0.7 mg/dL (ref 0.3–1.2)
Total Protein: 6.3 g/dL — ABNORMAL LOW (ref 6.5–8.1)

## 2022-08-19 LAB — C-REACTIVE PROTEIN: CRP: 0.8 mg/dL (ref <1.0)

## 2022-08-19 LAB — FERRITIN: Ferritin: 159 ng/mL (ref 11–307)

## 2022-08-19 MED ORDER — NIRMATRELVIR/RITONAVIR (PAXLOVID)TABLET: 3.0000 | ORAL_TABLET | Freq: Two times a day (BID) | ORAL | 0 refills | Status: AC

## 2022-08-19 NOTE — TOC Transition Note (Signed)
Transition of Care [facility]) - CM/SW Discharge Note   Patient Details  Name: Morgan Dennis MRN: [id number] Date of Birth: 1934-10-16  Transition of Care [facility]) CM/SW Contact:  Morgan Masson, RN Phone [phone] 08/19/2022, 1:24 PM   Clinical Narrative:    Pt will return to [facility] via EMS as requested. Son made aware previously and hard scripts requested for medications to be pick up by the pt's son. Information faxed to the facility aong with updated medications via FL2 and discharge summary as requested and Medical Necessity provided to the floor or [facility] transport to the facility.    No other needs presented at this time. TOC remains available.   Final next level of care: Other (comment) Barriers to Discharge: Barriers Resolved   Patient Goals and CMS Choice     Choice offered to / list presented to : NA  Discharge Placement                Patient to be transferred to facility by: EMS Name of family member notified: Morgan Dennis (HIPAA message left) Patient and family notified of of transfer: 08/19/22  Discharge Plan and Services     Post Acute Care Choice: Resumption of Svcs/PTA Provider                    HH Arranged:  [facility] will resume with Morgan Dennis)   Date [facility] Agency Contacted: 08/19/22 Time [place]: 1323 Representative spoke with at [place]: Morgan Dennis (Resume [facility] PT/RN/Speech)  Social Determinants of Health (SDOH) Interventions     Readmission Risk Interventions     No data to display

## 2022-08-19 NOTE — NC FL2 (Signed)
[place] LEVEL OF CARE SCREENING TOOL     IDENTIFICATION  Patient Name: Morgan Dennis Birthdate: 10/28/1934 Sex: female Admission Date (Current Location): 08/17/2022  [place] and [place] Number:  Engineering geologist and Address:  [facility], [address] [place], [place] [address]      Provider Number:    Attending Physician Name and Address:  Jennye Boroughs, MD  Relative Name and Phone Number:  Takiyah Bohnsack [phone]    Current Level of Care: Other (Comment) Recommended Level of Care: Other (Comment) ([place]) Prior Approval Number:    Date Approved/Denied:   PASRR Number:    Discharge Plan: Other (Comment) ([place])    Current Diagnoses: Patient Active Problem List   Diagnosis Date Noted   Acute hypoxemic respiratory failure due to COVID-19 ([place]) 08/17/2022   Hypokalemia 08/17/2022   Essential hypertension 08/17/2022   Dyslipidemia 08/17/2022   Hearing deficit 05/04/2021   Acute respiratory failure due to COVID-19 ([place]) 12/10/2019   Acute on chronic respiratory failure with hypoxia ([place]) 10/19/2019   Thrombocytopenia ([place]) 10/19/2019   Dementia without behavioral disturbance ([place]) 10/19/2019   Aortic atherosclerosis ([place]) 10/19/2019   Right upper lobe pneumonia 10/17/2019   Sepsis ([place]) 06/16/2019   Parkinson's disease ([place]) 04/23/2017   Skin cancer 02/12/2017   Chronic bronchitis ([place]) 01/02/2017   Frequency of urination and polyuria 10/12/2015   Upper GI bleed 08/18/2015   Adaptation reaction 04/30/2015   Malaise and fatigue 04/30/2015   Affective disorder, major 04/30/2015   Bad memory 04/30/2015   Mild cognitive disorder 04/30/2015   Fungal infection of toenail 04/30/2015   Arthritis, degenerative 04/30/2015   OP (osteoporosis) 04/30/2015   Arthritis or polyarthritis, rheumatoid ([place]) 04/30/2015   Personal history of other malignant neoplasm of skin  11/16/2014   Avitaminosis D 05/12/2014   Trigger finger 05/12/2014   Rheumatoid arthritis ([place]) 03/17/2014   Osteoporosis, post-menopausal 03/17/2014    Orientation RESPIRATION BLADDER Height & Weight     Self  O2 Incontinent Weight: 84.9 kg Height:  '5\' 4"'$  (162.6 cm)  BEHAVIORAL SYMPTOMS/MOOD NEUROLOGICAL BOWEL NUTRITION STATUS      Incontinent Diet  AMBULATORY STATUS COMMUNICATION OF NEEDS Skin   Supervision Verbally Normal                       Personal Care Assistance Level of Assistance  Bathing, Feeding, Dressing Bathing Assistance: Limited assistance Feeding assistance: Limited assistance Dressing Assistance: Limited assistance     Functional Limitations Info             SPECIAL CARE FACTORS FREQUENCY                       Contractures Contractures Info: Not present    Additional Factors Info  Code Status Code Status Info: DNR             Medication List       STOP taking these medications     abatacept 250 MG injection Commonly known as: ORENCIA    CALCIUM PO    calcium-vitamin D 500-5 MG-MCG tablet Commonly known as: OSCAL WITH D    gabapentin 600 MG tablet Commonly known as: NEURONTIN    hydrochlorothiazide 12.5 MG capsule Commonly known as: MICROZIDE    zinc sulfate 220 (50 Zn) MG capsule           TAKE these medications     acetaminophen  325 MG tablet Commonly known as: TYLENOL Take 2 tablets (650 mg total) by mouth every 6 (six) hours as needed for mild pain (or Fever >/= 101).    amLODipine 5 MG tablet Commonly known as: NORVASC Take 1 tablet (5 mg total) by mouth daily.    ascorbic acid 1000 MG tablet Commonly known as: VITAMIN C Take 1,000 mg by mouth daily.    atorvastatin 20 MG tablet Commonly known as: LIPITOR Take 20 mg by mouth at bedtime.    carbidopa-levodopa 25-100 MG tablet Commonly known as: SINEMET IR Take 2 tablets by mouth 3 (three) times daily.    carbidopa-levodopa-entacapone  50-200-200 MG tablet Commonly known as: STALEVO Take 1 tablet by mouth at bedtime.    denosumab 60 MG/ML Sosy injection Commonly known as: PROLIA Inject 60 mg into the skin every 6 (six) months.    diclofenac Sodium 1 % Gel Commonly known as: VOLTAREN Apply 4 g topically in the morning, at noon, and at bedtime. To both knees    docusate sodium 100 MG capsule Commonly known as: COLACE Take 100 mg by mouth daily as needed. Monday, Wednesday, Friday    donepezil 10 MG tablet Commonly known as: ARICEPT Take 10 mg by mouth at bedtime.    DULoxetine 60 MG capsule Commonly known as: CYMBALTA Take 60 mg by mouth daily.    folic acid 1 MG tablet Commonly known as: FOLVITE Take 1 mg by mouth daily.    furosemide 40 MG tablet Commonly known as: LASIX Take 40 mg by mouth daily.    HYDROcodone-acetaminophen 7.5-325 MG tablet Commonly known as: NORCO Take 1 tablet by mouth in the morning and at bedtime.    lanolin/mineral oil Lotn Apply 1 Application topically daily. To legs    lisinopril 5 MG tablet Commonly known as: ZESTRIL Take 5 mg by mouth daily.    melatonin 5 MG Tabs Take 5 mg by mouth at bedtime.    memantine 10 MG tablet Commonly known as: NAMENDA Take 10 mg by mouth 2 (two) times daily.    methotrexate 2.5 MG tablet Take 15 mg by mouth every Wednesday.    Minerin Creme Crea Apply 1 Application topically daily.    multivitamin with minerals Tabs tablet Take 1 tablet by mouth daily.    nirmatrelvir/ritonavir EUA 20 x 150 MG & 10 x '100MG'$  Tabs Commonly known as: PAXLOVID Take 3 tablets by mouth 2 (two) times daily for 6 doses. Patient GFR is >60. Take nirmatrelvir (150 mg) two tablets twice daily for 5 days and ritonavir (100 mg) one tablet twice daily for 5 days.    oyster calcium 500 MG Tabs tablet Take 500 mg of elemental calcium by mouth daily.    polyethylene glycol 17 g packet Commonly known as: MiraLax Take 17 g by mouth daily as needed for  moderate constipation or severe constipation. 2-3 days without stool    pregabalin 150 MG capsule Commonly known as: LYRICA Take 150 mg by mouth 2 (two) times daily.    solifenacin 5 MG tablet Commonly known as: VESICARE Take 1 tablet (5 mg total) by mouth daily.    Vitamin D3 50 MCG (2000 UT) Tabs Take 2,000 Units by mouth daily.    vitamin E 180 MG (400 UNITS) capsule Take 400 Units by mouth daily.    Discharge Medications: Please see discharge summary for a list of discharge medications.  Relevant Imaging Results:  Relevant Lab Results:   Additional Information SS# [id number]// Pt will  resume PT/Speech and RN with Wyonia Hough, Dorian Pod, RN

## 2022-08-19 NOTE — Discharge Summary (Addendum)
Physician Discharge Summary   Patient: Morgan Dennis MRN: [id number] DOB: 04/16/1934  Admit date:     08/17/2022  Discharge date:   Discharge Physician: Jennye Boroughs   PCP: Jerrol Banana., MD   Recommendations at discharge:   Follow-up with PCP in 1 week Repeat CBC in 3 days to monitor WBC.  Discharge Diagnoses: Principal Problem:   Acute hypoxemic respiratory failure due to COVID-19 [facility]) Active Problems:   Hypokalemia   Parkinson's disease ([place])   Acute on chronic respiratory failure with hypoxia (HCC)   Essential hypertension   Dyslipidemia  Resolved Problems:   * No resolved hospital problems. [facility] Course:  Morgan Dennis is a 88 y.o. female with medical history significant for Parkinson's disease, dementia, restless leg syndrome, rheumatoid arthritis, dyslipidemia, GERD, chronic hypoxic respiratory failure on home oxygen at nighttime (but not medically adherent with oxygen) who presented to the hospital (from ALF) because of shortness of breath and low oxygen saturation (78% on 2 L/min oxygen).  She tested positive for COVID-19 infection.  She has had 2 vaccine injections for COVID-19 in the past.   She was admitted to the hospital for [id number] infection complicated by acute on chronic hypoxic respiratory failure.  She was treated with Paxlovid and steroids.  She was placed on 2 L/min oxygen via nasal cannula.  She is supposed to use oxygen at home but according to her son, she is medically nonadherent with oxygen. The importance of continuous oxygen use was reiterated.  She developed leukocytosis but this is likely from steroids.  Repeat chest x-ray did not show any acute abnormality.  Her condition has improved and she is deemed stable for discharge to assisted living facility today.  Discharge plan was discussed with A.J her son.    Consultants: None Procedures performed: None  Disposition: Assisted living Diet recommendation:  Discharge Diet Orders  (From admission, onward)     Start     Ordered   08/19/22 0000  Diet - low sodium heart healthy        08/19/22 1112           Cardiac diet DISCHARGE MEDICATION: Allergies as of 08/19/2022       Reactions   Penicillins Swelling, Rash   Other reaction(s): Rash Has patient had a PCN reaction causing immediate rash, facial/tongue/throat swelling, SOB or lightheadedness with hypotension: Yes Has patient had a PCN reaction causing severe rash involving mucus membranes or skin necrosis: Unknown Has patient had a PCN reaction that required hospitalization:Yes Has patient had a PCN reaction occurring within the last 10 years: No If all of the above answers are "NO", then may proceed with Cephalosporin use.   Evista  [raloxifene]    Other reaction(s): Joint Pains   Ibandronic Acid    Other reaction(s): Muscle Pain   Remicade [infliximab] Other (See Comments)   Due to bleeding ulcer issue--MD advised her not to take   Risedronate Sodium    Other reaction(s): Joint Pains        Medication List     STOP taking these medications    abatacept 250 MG injection Commonly known as: ORENCIA   CALCIUM PO   calcium-vitamin D 500-5 MG-MCG tablet Commonly known as: OSCAL WITH D   gabapentin 600 MG tablet Commonly known as: NEURONTIN   hydrochlorothiazide 12.5 MG capsule Commonly known as: MICROZIDE   zinc sulfate 220 (50 Zn) MG capsule       TAKE these medications  acetaminophen 325 MG tablet Commonly known as: TYLENOL Take 2 tablets (650 mg total) by mouth every 6 (six) hours as needed for mild pain (or Fever >/= 101).   amLODipine 5 MG tablet Commonly known as: NORVASC Take 1 tablet (5 mg total) by mouth daily.   ascorbic acid 1000 MG tablet Commonly known as: VITAMIN C Take 1,000 mg by mouth daily.   atorvastatin 20 MG tablet Commonly known as: LIPITOR Take 20 mg by mouth at bedtime.   carbidopa-levodopa 25-100 MG tablet Commonly known as: SINEMET IR Take 2  tablets by mouth 3 (three) times daily.   carbidopa-levodopa-entacapone 50-200-200 MG tablet Commonly known as: STALEVO Take 1 tablet by mouth at bedtime.   denosumab 60 MG/ML Sosy injection Commonly known as: PROLIA Inject 60 mg into the skin every 6 (six) months.   diclofenac Sodium 1 % Gel Commonly known as: VOLTAREN Apply 4 g topically in the morning, at noon, and at bedtime. To both knees   docusate sodium 100 MG capsule Commonly known as: COLACE Take 100 mg by mouth daily as needed. Monday, Wednesday, Friday   donepezil 10 MG tablet Commonly known as: ARICEPT Take 10 mg by mouth at bedtime.   DULoxetine 60 MG capsule Commonly known as: CYMBALTA Take 60 mg by mouth daily.   folic acid 1 MG tablet Commonly known as: FOLVITE Take 1 mg by mouth daily.   furosemide 40 MG tablet Commonly known as: LASIX Take 40 mg by mouth daily.   HYDROcodone-acetaminophen 7.5-325 MG tablet Commonly known as: NORCO Take 1 tablet by mouth in the morning and at bedtime.   lanolin/mineral oil Lotn Apply 1 Application topically daily. To legs   lisinopril 5 MG tablet Commonly known as: ZESTRIL Take 5 mg by mouth daily.   melatonin 5 MG Tabs Take 5 mg by mouth at bedtime.   memantine 10 MG tablet Commonly known as: NAMENDA Take 10 mg by mouth 2 (two) times daily.   methotrexate 2.5 MG tablet Take 15 mg by mouth every Wednesday.   Minerin Creme Crea Apply 1 Application topically daily.   multivitamin with minerals Tabs tablet Take 1 tablet by mouth daily.   nirmatrelvir/ritonavir EUA 20 x 150 MG & 10 x '100MG'$  Tabs Commonly known as: PAXLOVID Take 3 tablets by mouth 2 (two) times daily for 6 doses. Patient GFR is >60. Take nirmatrelvir (150 mg) two tablets twice daily for 5 days and ritonavir (100 mg) one tablet twice daily for 5 days.   oyster calcium 500 MG Tabs tablet Take 500 mg of elemental calcium by mouth daily.   polyethylene glycol 17 g packet Commonly known as:  MiraLax Take 17 g by mouth daily as needed for moderate constipation or severe constipation. 2-3 days without stool   pregabalin 150 MG capsule Commonly known as: LYRICA Take 150 mg by mouth 2 (two) times daily.   solifenacin 5 MG tablet Commonly known as: VESICARE Take 1 tablet (5 mg total) by mouth daily.   Vitamin D3 50 MCG (2000 UT) Tabs Take 2,000 Units by mouth daily.   vitamin E 180 MG (400 UNITS) capsule Take 400 Units by mouth daily.        Discharge Exam: Filed Weights   08/17/22 1600  Weight: 84.9 kg   GEN: NAD SKIN: Warm and dry EYES: No pallor or icterus ENT: MMM CV: RRR PULM: CTA B ABD: soft, ND, NT, +BS CNS: AAO x 3, non focal EXT: No edema or tenderness  Condition at discharge: good  The results of significant diagnostics from this hospitalization (including imaging, microbiology, ancillary and laboratory) are listed below for reference.   Imaging Studies: DG Chest Port 1 View  Result Date: 08/19/2022 CLINICAL DATA:  Short of breath.  Follow-up study. EXAM: PORTABLE CHEST 1 VIEW COMPARISON:  08/17/2022 and older studies. FINDINGS: Lung volumes are relatively low. There prominent bronchovascular markings, but no consolidation or convincing edema. Cardiac silhouette is normal in size. No pleural effusion or pneumothorax. IMPRESSION: 1. No acute cardiopulmonary disease and no significant change from the recent prior study allowing for lower lung volumes on the current exam. Electronically Signed   By: Lajean Manes M.D.   On: 08/19/2022 09:43   DG Chest 2 View  Result Date: 08/17/2022 CLINICAL DATA:  Shortness of breath while ambulating EXAM: CHEST - 2 VIEW COMPARISON:  12/09/2019 FINDINGS: Normal heart size and pulmonary vascularity. Atherosclerotic calcification and tortuosity of thoracic aorta. Peribronchial thickening and chronic accentuation of perihilar markings question bronchitis. Eventration anterior RIGHT diaphragm. No acute infiltrate, pleural  effusion, or pneumothorax. Prominent RIGHT first costochondral junction. Osseous demineralization with prior vertebroplasty at upper lumbar spine IMPRESSION: Chronic bronchitic changes without acute abnormalities. Aortic Atherosclerosis (ICD10-I70.0). Electronically Signed   By: Lavonia Dana M.D.   On: 08/17/2022 12:06    Microbiology: Results for orders placed or performed during the hospital encounter of 08/17/22  Resp Panel by RT-PCR (Flu A&B, Covid) Anterior Nasal Swab     Status: Abnormal   Collection Time: 08/17/22  1:53 PM   Specimen: Anterior Nasal Swab  Result Value Ref Range Status   SARS Coronavirus 2 by RT PCR POSITIVE (A) NEGATIVE Final    Comment: (NOTE) SARS-CoV-2 target nucleic acids are DETECTED.  The SARS-CoV-2 RNA is generally detectable in upper respiratory specimens during the acute phase of infection. Positive results are indicative of the presence of the identified virus, but do not rule out bacterial infection or co-infection with other pathogens not detected by the test. Clinical correlation with patient history and other diagnostic information is necessary to determine patient infection status. The expected result is Negative.  Fact Sheet for Patients: EntrepreneurPulse.com.au  Fact Sheet for Healthcare Providers: IncredibleEmployment.be  This test is not yet approved or cleared by the [place] FDA and  has been authorized for detection and/or diagnosis of SARS-CoV-2 by FDA under an Emergency Use Authorization (EUA).  This EUA will remain in effect (meaning this test can be used) for the duration of  the COVID-19 declaration under Section 564(b)(1) of the A ct, 21 U.S.C. section 360bbb-3(b)(1), unless the authorization is terminated or revoked sooner.     Influenza A by PCR NEGATIVE NEGATIVE Final   Influenza B by PCR NEGATIVE NEGATIVE Final    Comment: (NOTE) The Xpert Xpress SARS-CoV-2/FLU/RSV plus assay is  intended as an aid in the diagnosis of influenza from Nasopharyngeal swab specimens and should not be used as a sole basis for treatment. Nasal washings and aspirates are unacceptable for Xpert Xpress SARS-CoV-2/FLU/RSV testing.  Fact Sheet for Patients: EntrepreneurPulse.com.au  Fact Sheet for Healthcare Providers: IncredibleEmployment.be  This test is not yet approved or cleared by the [place] FDA and has been authorized for detection and/or diagnosis of SARS-CoV-2 by FDA under an Emergency Use Authorization (EUA). This EUA will remain in effect (meaning this test can be used) for the duration of the COVID-19 declaration under Section 564(b)(1) of the Act, 21 U.S.C. section 360bbb-3(b)(1), unless the authorization is terminated or revoked.  Performed at [facility], [address] [place], [place] [address]   MRSA Next Gen by PCR, Nasal     Status: None   Collection Time: 08/18/22  3:03 PM   Specimen: Nasal Mucosa; Nasal Swab  Result Value Ref Range Status   MRSA by PCR Next Gen NOT DETECTED NOT DETECTED Final    Comment: (NOTE) The GeneXpert MRSA Assay (FDA approved for NASAL specimens only), is one component of a comprehensive MRSA colonization surveillance program. It is not intended to diagnose MRSA infection nor to guide or monitor treatment for MRSA infections. Test performance is not FDA approved in patients less than 65 years old. Performed at [facility], [address] [place], [place] [address]     Labs: CBC: Recent Labs  Lab 08/17/22 1122 08/18/22 0420 08/19/22 0431  WBC 7.6 6.0 24.9*  NEUTROABS  --  4.6 22.8*  HGB 14.9 13.6 13.2  HCT 45.2 40.9 39.0  MCV 98.3 98.6 98.2  PLT 214 174 625   Basic Metabolic Panel: Recent Labs  Lab 08/17/22 1122 08/17/22 1449 08/18/22 0420 08/19/22 0431  NA 139  --  137 139  K 3.3*  --  4.1 4.1  CL 99  --  104 106  CO2 28  --  27 28  GLUCOSE 154*   --  150* 153*  BUN 14  --  16 21  CREATININE 0.79  --  0.63 0.64  CALCIUM 9.8  --  9.0 8.9  MG  --  2.3  --   --    Liver Function Tests: Recent Labs  Lab 08/18/22 0420 08/19/22 0431  AST 41 31  ALT 9 8  ALKPHOS 63 71  BILITOT 1.1 0.7  PROT 6.7 6.3*  ALBUMIN 3.6 3.5   CBG: No results for input(s): "GLUCAP" in the last 168 hours.  Discharge time spent: greater than 30 minutes.  Signed: Jennye Boroughs, MD [facility] 08/19/2022

## 2022-08-22 ENCOUNTER — Telehealth: Payer: Self-pay

## 2022-08-22 DIAGNOSIS — M0520 Rheumatoid vasculitis with rheumatoid arthritis of unspecified site: Secondary | ICD-10-CM | POA: Diagnosis not present

## 2022-08-22 DIAGNOSIS — E119 Type 2 diabetes mellitus without complications: Secondary | ICD-10-CM | POA: Diagnosis not present

## 2022-08-22 DIAGNOSIS — D518 Other vitamin B12 deficiency anemias: Secondary | ICD-10-CM | POA: Diagnosis not present

## 2022-08-22 DIAGNOSIS — E785 Hyperlipidemia, unspecified: Secondary | ICD-10-CM | POA: Diagnosis not present

## 2022-08-22 DIAGNOSIS — E038 Other specified hypothyroidism: Secondary | ICD-10-CM | POA: Diagnosis not present

## 2022-08-22 DIAGNOSIS — G309 Alzheimer's disease, unspecified: Secondary | ICD-10-CM | POA: Diagnosis not present

## 2022-08-22 DIAGNOSIS — M1990 Unspecified osteoarthritis, unspecified site: Secondary | ICD-10-CM | POA: Diagnosis not present

## 2022-08-22 DIAGNOSIS — E782 Mixed hyperlipidemia: Secondary | ICD-10-CM | POA: Diagnosis not present

## 2022-08-22 DIAGNOSIS — M810 Age-related osteoporosis without current pathological fracture: Secondary | ICD-10-CM | POA: Diagnosis not present

## 2022-08-22 DIAGNOSIS — I10 Essential (primary) hypertension: Secondary | ICD-10-CM | POA: Diagnosis not present

## 2022-08-22 DIAGNOSIS — E559 Vitamin D deficiency, unspecified: Secondary | ICD-10-CM | POA: Diagnosis not present

## 2022-08-22 NOTE — Telephone Encounter (Signed)
Transition Care Management Unsuccessful Follow-up Telephone Call TCM [facility] 08-19-22 Dx: Acute hypoxemic respiratory failure due to COVID-19 Date of discharge and from where:    Attempts:  1st Attempt  Reason for unsuccessful TCM follow-up call:  Left voice message

## 2022-08-23 NOTE — Telephone Encounter (Signed)
Per Joyice Faster- pt is following PCP at ALF- no longer following Dr Rosanna Randy- TCM note not done

## 2022-08-24 DIAGNOSIS — R011 Cardiac murmur, unspecified: Secondary | ICD-10-CM | POA: Diagnosis not present

## 2022-08-24 DIAGNOSIS — R5381 Other malaise: Secondary | ICD-10-CM | POA: Diagnosis not present

## 2022-08-24 DIAGNOSIS — R1310 Dysphagia, unspecified: Secondary | ICD-10-CM | POA: Diagnosis not present

## 2022-08-24 DIAGNOSIS — G4700 Insomnia, unspecified: Secondary | ICD-10-CM | POA: Diagnosis not present

## 2022-08-24 DIAGNOSIS — U071 COVID-19: Secondary | ICD-10-CM | POA: Diagnosis not present

## 2022-08-24 DIAGNOSIS — G309 Alzheimer's disease, unspecified: Secondary | ICD-10-CM | POA: Diagnosis not present

## 2022-08-24 DIAGNOSIS — I10 Essential (primary) hypertension: Secondary | ICD-10-CM | POA: Diagnosis not present

## 2022-08-24 DIAGNOSIS — I350 Nonrheumatic aortic (valve) stenosis: Secondary | ICD-10-CM | POA: Diagnosis not present

## 2022-08-24 DIAGNOSIS — K5900 Constipation, unspecified: Secondary | ICD-10-CM | POA: Diagnosis not present

## 2022-08-24 DIAGNOSIS — G20 Parkinson's disease: Secondary | ICD-10-CM | POA: Diagnosis not present

## 2022-08-24 DIAGNOSIS — E785 Hyperlipidemia, unspecified: Secondary | ICD-10-CM | POA: Diagnosis not present

## 2022-08-29 DIAGNOSIS — Z79899 Other long term (current) drug therapy: Secondary | ICD-10-CM | POA: Diagnosis not present

## 2022-08-29 DIAGNOSIS — D518 Other vitamin B12 deficiency anemias: Secondary | ICD-10-CM | POA: Diagnosis not present

## 2022-08-29 DIAGNOSIS — E782 Mixed hyperlipidemia: Secondary | ICD-10-CM | POA: Diagnosis not present

## 2022-09-03 DIAGNOSIS — J42 Unspecified chronic bronchitis: Secondary | ICD-10-CM | POA: Diagnosis not present

## 2022-09-07 DIAGNOSIS — E559 Vitamin D deficiency, unspecified: Secondary | ICD-10-CM | POA: Diagnosis not present

## 2022-09-07 DIAGNOSIS — G20 Parkinson's disease: Secondary | ICD-10-CM | POA: Diagnosis not present

## 2022-09-07 DIAGNOSIS — U071 COVID-19: Secondary | ICD-10-CM | POA: Diagnosis not present

## 2022-09-07 DIAGNOSIS — R1310 Dysphagia, unspecified: Secondary | ICD-10-CM | POA: Diagnosis not present

## 2022-09-07 DIAGNOSIS — R69 Illness, unspecified: Secondary | ICD-10-CM | POA: Diagnosis not present

## 2022-09-07 DIAGNOSIS — I10 Essential (primary) hypertension: Secondary | ICD-10-CM | POA: Diagnosis not present

## 2022-09-07 DIAGNOSIS — M0520 Rheumatoid vasculitis with rheumatoid arthritis of unspecified site: Secondary | ICD-10-CM | POA: Diagnosis not present

## 2022-09-07 DIAGNOSIS — G4700 Insomnia, unspecified: Secondary | ICD-10-CM | POA: Diagnosis not present

## 2022-09-07 DIAGNOSIS — R5381 Other malaise: Secondary | ICD-10-CM | POA: Diagnosis not present

## 2022-09-07 DIAGNOSIS — K5900 Constipation, unspecified: Secondary | ICD-10-CM | POA: Diagnosis not present

## 2022-09-07 DIAGNOSIS — R011 Cardiac murmur, unspecified: Secondary | ICD-10-CM | POA: Diagnosis not present

## 2022-09-07 DIAGNOSIS — N3281 Overactive bladder: Secondary | ICD-10-CM | POA: Diagnosis not present

## 2022-09-09 DIAGNOSIS — R0902 Hypoxemia: Secondary | ICD-10-CM | POA: Diagnosis not present

## 2022-09-11 DIAGNOSIS — M19079 Primary osteoarthritis, unspecified ankle and foot: Secondary | ICD-10-CM | POA: Diagnosis not present

## 2022-09-11 DIAGNOSIS — B351 Tinea unguium: Secondary | ICD-10-CM | POA: Diagnosis not present

## 2022-09-11 DIAGNOSIS — L600 Ingrowing nail: Secondary | ICD-10-CM | POA: Diagnosis not present

## 2022-09-11 DIAGNOSIS — M79671 Pain in right foot: Secondary | ICD-10-CM | POA: Diagnosis not present

## 2022-09-11 DIAGNOSIS — M79672 Pain in left foot: Secondary | ICD-10-CM | POA: Diagnosis not present

## 2022-09-12 DIAGNOSIS — D518 Other vitamin B12 deficiency anemias: Secondary | ICD-10-CM | POA: Diagnosis not present

## 2022-09-12 DIAGNOSIS — E119 Type 2 diabetes mellitus without complications: Secondary | ICD-10-CM | POA: Diagnosis not present

## 2022-09-12 DIAGNOSIS — E782 Mixed hyperlipidemia: Secondary | ICD-10-CM | POA: Diagnosis not present

## 2022-09-12 DIAGNOSIS — Z79899 Other long term (current) drug therapy: Secondary | ICD-10-CM | POA: Diagnosis not present

## 2022-09-13 DIAGNOSIS — I10 Essential (primary) hypertension: Secondary | ICD-10-CM | POA: Diagnosis not present

## 2022-09-14 DIAGNOSIS — I5040 Unspecified combined systolic (congestive) and diastolic (congestive) heart failure: Secondary | ICD-10-CM | POA: Diagnosis not present

## 2022-09-17 DIAGNOSIS — E038 Other specified hypothyroidism: Secondary | ICD-10-CM | POA: Diagnosis not present

## 2022-09-17 DIAGNOSIS — E559 Vitamin D deficiency, unspecified: Secondary | ICD-10-CM | POA: Diagnosis not present

## 2022-09-17 DIAGNOSIS — M810 Age-related osteoporosis without current pathological fracture: Secondary | ICD-10-CM | POA: Diagnosis not present

## 2022-09-17 DIAGNOSIS — G309 Alzheimer's disease, unspecified: Secondary | ICD-10-CM | POA: Diagnosis not present

## 2022-09-17 DIAGNOSIS — I10 Essential (primary) hypertension: Secondary | ICD-10-CM | POA: Diagnosis not present

## 2022-09-17 DIAGNOSIS — E119 Type 2 diabetes mellitus without complications: Secondary | ICD-10-CM | POA: Diagnosis not present

## 2022-09-17 DIAGNOSIS — D518 Other vitamin B12 deficiency anemias: Secondary | ICD-10-CM | POA: Diagnosis not present

## 2022-09-17 DIAGNOSIS — E782 Mixed hyperlipidemia: Secondary | ICD-10-CM | POA: Diagnosis not present

## 2022-09-19 DIAGNOSIS — Z79899 Other long term (current) drug therapy: Secondary | ICD-10-CM | POA: Diagnosis not present

## 2022-09-19 DIAGNOSIS — E782 Mixed hyperlipidemia: Secondary | ICD-10-CM | POA: Diagnosis not present

## 2022-09-19 DIAGNOSIS — D518 Other vitamin B12 deficiency anemias: Secondary | ICD-10-CM | POA: Diagnosis not present

## 2022-09-19 DIAGNOSIS — E119 Type 2 diabetes mellitus without complications: Secondary | ICD-10-CM | POA: Diagnosis not present

## 2022-09-21 DIAGNOSIS — J811 Chronic pulmonary edema: Secondary | ICD-10-CM | POA: Diagnosis not present

## 2022-09-26 DIAGNOSIS — Z79899 Other long term (current) drug therapy: Secondary | ICD-10-CM | POA: Diagnosis not present

## 2022-09-26 DIAGNOSIS — D518 Other vitamin B12 deficiency anemias: Secondary | ICD-10-CM | POA: Diagnosis not present

## 2022-09-26 DIAGNOSIS — E782 Mixed hyperlipidemia: Secondary | ICD-10-CM | POA: Diagnosis not present

## 2022-09-28 DIAGNOSIS — G8929 Other chronic pain: Secondary | ICD-10-CM | POA: Diagnosis not present

## 2022-09-28 DIAGNOSIS — G4700 Insomnia, unspecified: Secondary | ICD-10-CM | POA: Diagnosis not present

## 2022-09-28 DIAGNOSIS — K5900 Constipation, unspecified: Secondary | ICD-10-CM | POA: Diagnosis not present

## 2022-09-28 DIAGNOSIS — L853 Xerosis cutis: Secondary | ICD-10-CM | POA: Diagnosis not present

## 2022-09-28 DIAGNOSIS — I350 Nonrheumatic aortic (valve) stenosis: Secondary | ICD-10-CM | POA: Diagnosis not present

## 2022-09-28 DIAGNOSIS — M1990 Unspecified osteoarthritis, unspecified site: Secondary | ICD-10-CM | POA: Diagnosis not present

## 2022-09-28 DIAGNOSIS — E559 Vitamin D deficiency, unspecified: Secondary | ICD-10-CM | POA: Diagnosis not present

## 2022-09-28 DIAGNOSIS — R011 Cardiac murmur, unspecified: Secondary | ICD-10-CM | POA: Diagnosis not present

## 2022-09-28 DIAGNOSIS — M810 Age-related osteoporosis without current pathological fracture: Secondary | ICD-10-CM | POA: Diagnosis not present

## 2022-09-28 DIAGNOSIS — I10 Essential (primary) hypertension: Secondary | ICD-10-CM | POA: Diagnosis not present

## 2022-09-29 DIAGNOSIS — J811 Chronic pulmonary edema: Secondary | ICD-10-CM | POA: Diagnosis not present

## 2022-09-29 IMAGING — US US PELVIS COMPLETE
1 series · 13 of 25 positions shown · non-contrast
Comparison: None available.

CLINICAL DATA: Initial evaluation for blood on pad, possible
postmenopausal bleeding.

EXAM:
TRANSABDOMINAL ULTRASOUND OF PELVIS
TECHNIQUE: Transabdominal ultrasound examination of the pelvis was performed
including evaluation of the uterus, ovaries, adnexal regions, and
pelvic cul-de-sac.

[Series 1: us pelvic complete with transvaginal · 47 acquisitions, 13 frames shown]
[im 1/47]
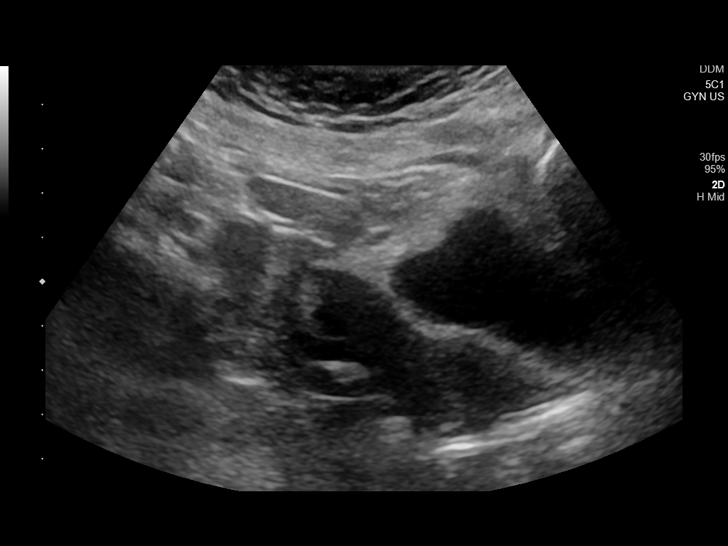
[im 4/47]
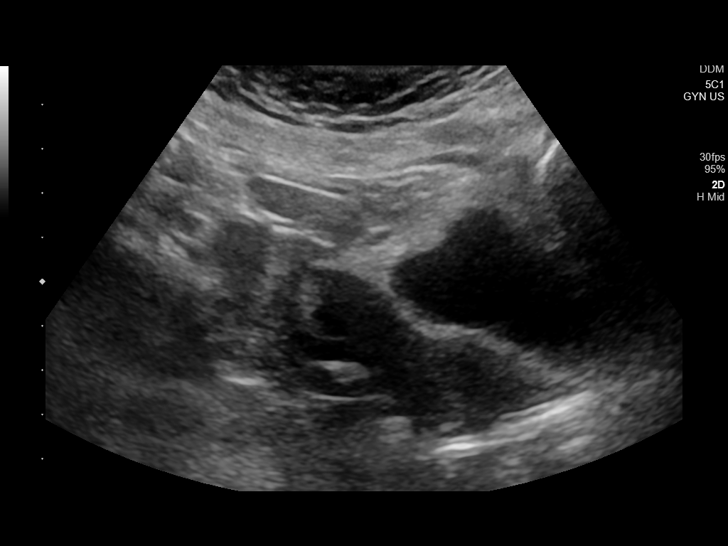
[im 8/47]
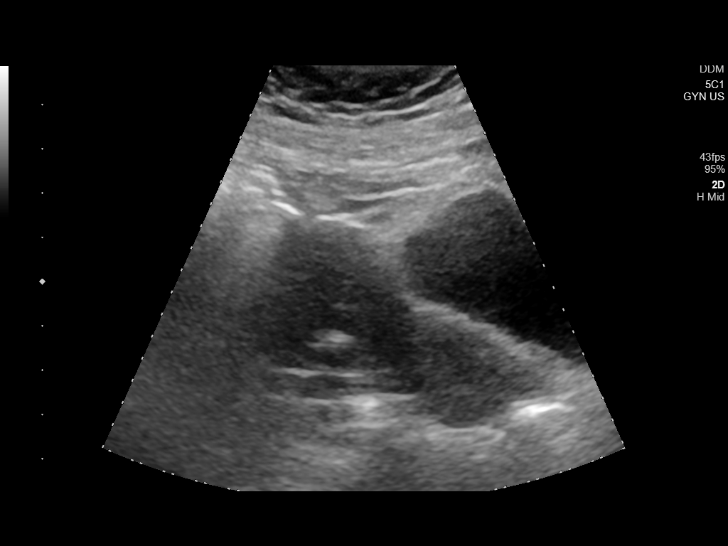
[im 12/47]
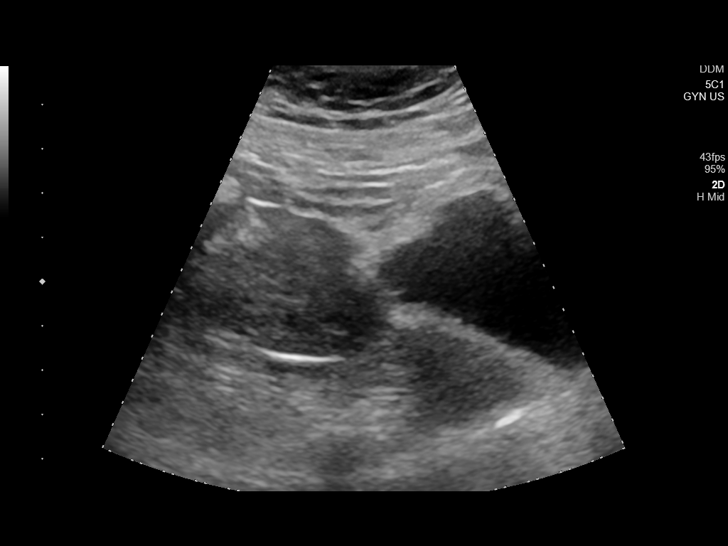
[im 16/47]
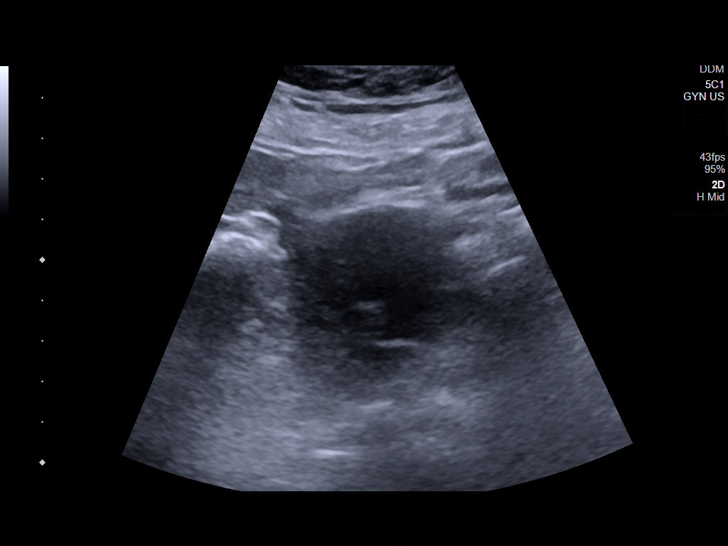
[im 20/47]
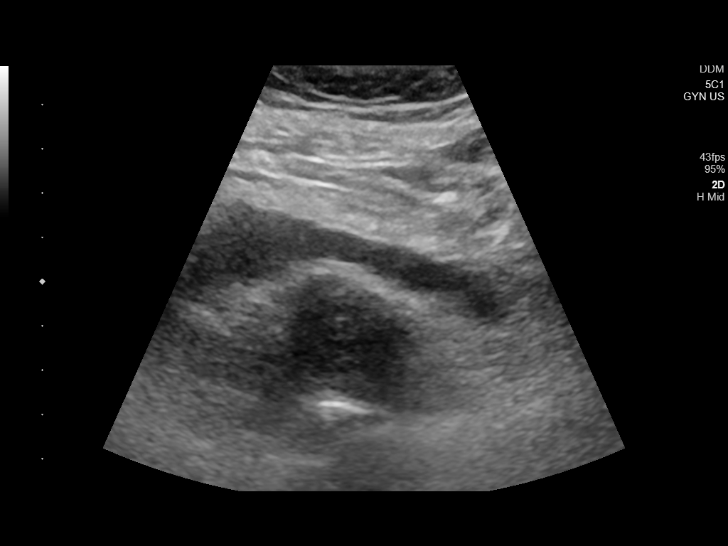
[im 24/47]
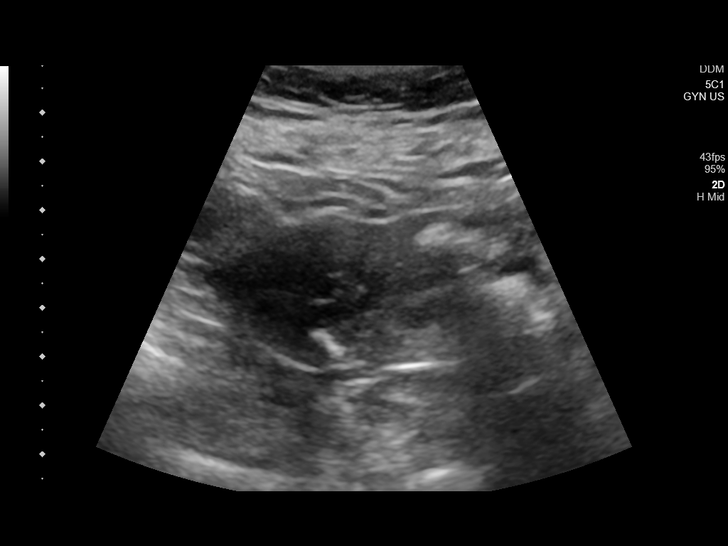
[im 27/47]
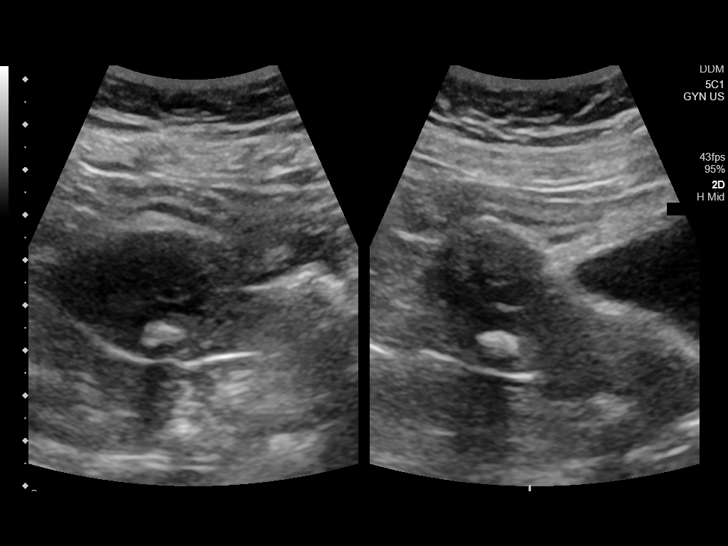
[im 31/47]
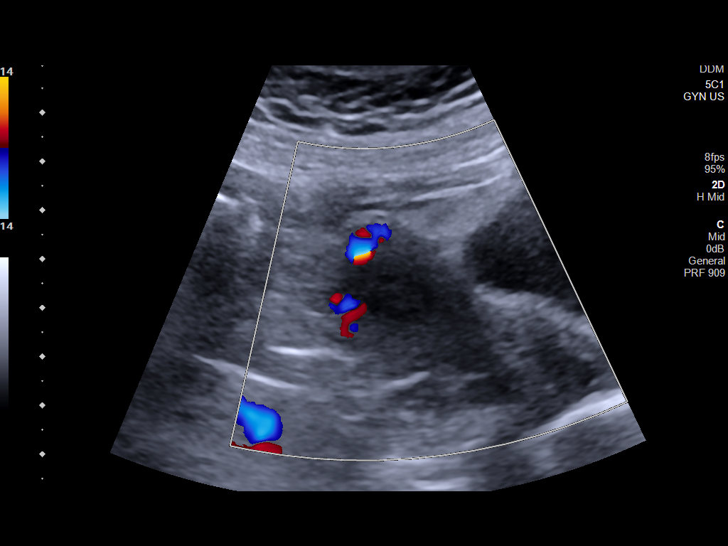
[im 35/47]
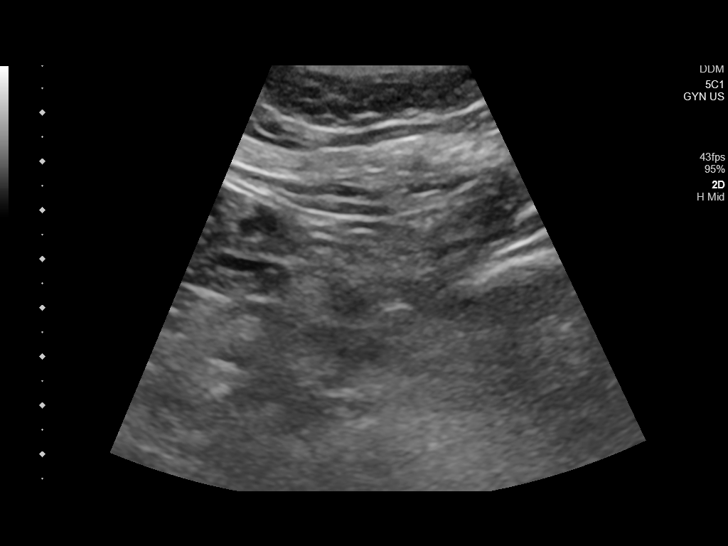
[im 39/47]
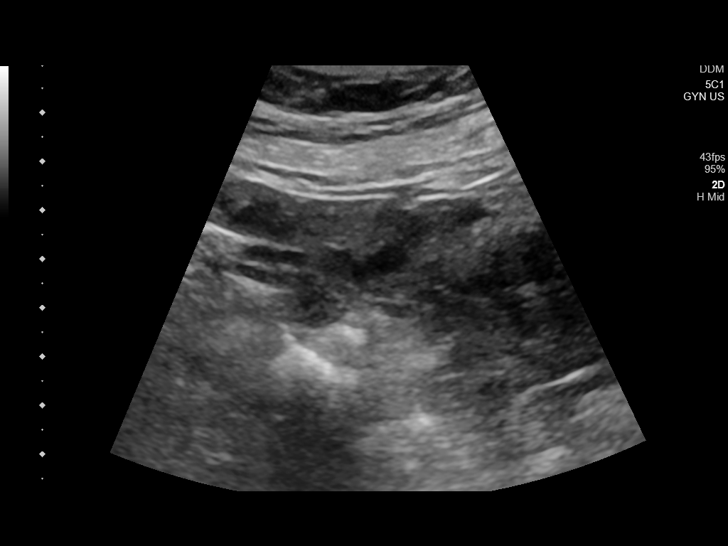
[im 43/47]
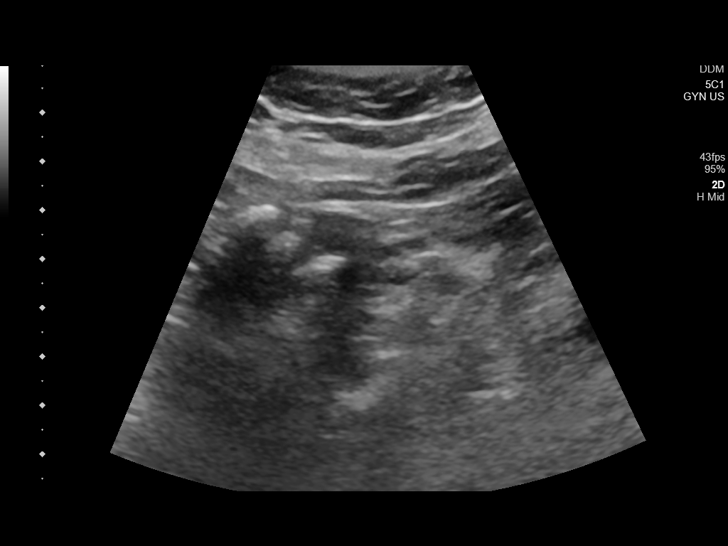
[im 47/47]
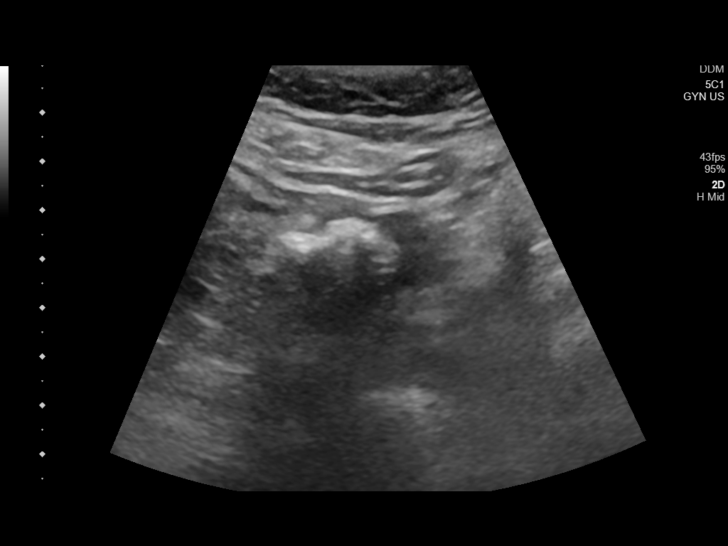

[13 of 25 positions shown; findings below may reference images not displayed]

FINDINGS: Uterus

Measurements: 5.9 x 2.7 x 4.0 cm = volume: 33 mL. Uterus is
anteverted. 1.0 x 0.9 x 6.3 cm calcified lesion at the posterior
uterine body likely reflects a calcified fibroid.

Endometrium

Thickness: 1.5 mm.  No focal abnormality visualized.

Right ovary

Not visualized.  No adnexal mass.

Left ovary

Not visualized.  No adnexal mass.

Other findings:  No abnormal free fluid.
IMPRESSION: 1. Normal sonographic appearance of the endometrium measuring 1.5 mm
in thickness. In the setting of post-menopausal bleeding, this is
consistent with a benign etiology such as endometrial atrophy. If
bleeding remains unresponsive to hormonal or medical therapy,
sonohysterogram should be considered for focal lesion work-up. (Ref:
Radiological Reasoning: Algorithmic Workup of Abnormal Vaginal
Bleeding with Endovaginal Sonography and Sonohysterography. AJR
[id number]; 191:S68-73)
2. 1 cm calcified fibroid at the posterior uterine body.
3. Nonvisualization of either ovary. No adnexal mass or free fluid
within the pelvis.

## 2022-10-03 DIAGNOSIS — J42 Unspecified chronic bronchitis: Secondary | ICD-10-CM | POA: Diagnosis not present

## 2022-10-07 ENCOUNTER — Emergency Department: Payer: Medicare HMO

## 2022-10-07 ENCOUNTER — Inpatient Hospital Stay
Admission: EM | Admit: 2022-10-07 | Discharge: 2022-10-20 | DRG: 871 | Disposition: A | Discharge status: Nursing Home (Includes ICF) | Payer: Medicare HMO | Source: Skilled Nursing Facility | Attending: Internal Medicine | Admitting: Internal Medicine

## 2022-10-07 ENCOUNTER — Inpatient Hospital Stay: Payer: Medicare HMO

## 2022-10-07 DIAGNOSIS — G2581 Restless legs syndrome: Secondary | ICD-10-CM | POA: Diagnosis not present

## 2022-10-07 DIAGNOSIS — I5A Non-ischemic myocardial injury (non-traumatic): Secondary | ICD-10-CM | POA: Diagnosis present

## 2022-10-07 DIAGNOSIS — G20A1 Parkinson's disease without dyskinesia, without mention of fluctuations: Secondary | ICD-10-CM | POA: Diagnosis not present

## 2022-10-07 DIAGNOSIS — K219 Gastro-esophageal reflux disease without esophagitis: Secondary | ICD-10-CM | POA: Diagnosis present

## 2022-10-07 DIAGNOSIS — E876 Hypokalemia: Secondary | ICD-10-CM | POA: Diagnosis present

## 2022-10-07 DIAGNOSIS — M1990 Unspecified osteoarthritis, unspecified site: Secondary | ICD-10-CM | POA: Diagnosis present

## 2022-10-07 DIAGNOSIS — Z66 Do not resuscitate: Secondary | ICD-10-CM | POA: Diagnosis present

## 2022-10-07 DIAGNOSIS — E669 Obesity, unspecified: Secondary | ICD-10-CM | POA: Diagnosis present

## 2022-10-07 DIAGNOSIS — R0902 Hypoxemia: Secondary | ICD-10-CM | POA: Diagnosis not present

## 2022-10-07 DIAGNOSIS — J189 Pneumonia, unspecified organism: Secondary | ICD-10-CM | POA: Diagnosis present

## 2022-10-07 DIAGNOSIS — I10 Essential (primary) hypertension: Secondary | ICD-10-CM | POA: Diagnosis present

## 2022-10-07 DIAGNOSIS — I700 Atherosclerosis of aorta: Secondary | ICD-10-CM | POA: Diagnosis not present

## 2022-10-07 DIAGNOSIS — Z79891 Long term (current) use of opiate analgesic: Secondary | ICD-10-CM

## 2022-10-07 DIAGNOSIS — Z880 Allergy status to penicillin: Secondary | ICD-10-CM

## 2022-10-07 DIAGNOSIS — Z1152 Encounter for screening for COVID-19: Secondary | ICD-10-CM

## 2022-10-07 DIAGNOSIS — E785 Hyperlipidemia, unspecified: Secondary | ICD-10-CM | POA: Diagnosis not present

## 2022-10-07 DIAGNOSIS — G20A2 Parkinson's disease without dyskinesia, with fluctuations: Secondary | ICD-10-CM | POA: Diagnosis not present

## 2022-10-07 DIAGNOSIS — G9341 Metabolic encephalopathy: Secondary | ICD-10-CM | POA: Diagnosis not present

## 2022-10-07 DIAGNOSIS — N179 Acute kidney failure, unspecified: Secondary | ICD-10-CM | POA: Diagnosis not present

## 2022-10-07 DIAGNOSIS — R6521 Severe sepsis with septic shock: Secondary | ICD-10-CM | POA: Diagnosis present

## 2022-10-07 DIAGNOSIS — I959 Hypotension, unspecified: Secondary | ICD-10-CM | POA: Diagnosis not present

## 2022-10-07 DIAGNOSIS — E860 Dehydration: Secondary | ICD-10-CM | POA: Diagnosis present

## 2022-10-07 DIAGNOSIS — I2699 Other pulmonary embolism without acute cor pulmonale: Secondary | ICD-10-CM | POA: Diagnosis present

## 2022-10-07 DIAGNOSIS — Z7901 Long term (current) use of anticoagulants: Secondary | ICD-10-CM

## 2022-10-07 DIAGNOSIS — K529 Noninfective gastroenteritis and colitis, unspecified: Secondary | ICD-10-CM | POA: Diagnosis present

## 2022-10-07 DIAGNOSIS — Z136 Encounter for screening for cardiovascular disorders: Secondary | ICD-10-CM | POA: Diagnosis not present

## 2022-10-07 DIAGNOSIS — Z743 Need for continuous supervision: Secondary | ICD-10-CM | POA: Diagnosis not present

## 2022-10-07 DIAGNOSIS — R6520 Severe sepsis without septic shock: Secondary | ICD-10-CM | POA: Diagnosis not present

## 2022-10-07 DIAGNOSIS — Z8616 Personal history of COVID-19: Secondary | ICD-10-CM | POA: Diagnosis not present

## 2022-10-07 DIAGNOSIS — Z751 Person awaiting admission to adequate facility elsewhere: Secondary | ICD-10-CM

## 2022-10-07 DIAGNOSIS — Z9981 Dependence on supplemental oxygen: Secondary | ICD-10-CM

## 2022-10-07 DIAGNOSIS — Z79631 Long term (current) use of antimetabolite agent: Secondary | ICD-10-CM

## 2022-10-07 DIAGNOSIS — J9621 Acute and chronic respiratory failure with hypoxia: Secondary | ICD-10-CM | POA: Diagnosis present

## 2022-10-07 DIAGNOSIS — R918 Other nonspecific abnormal finding of lung field: Secondary | ICD-10-CM | POA: Diagnosis not present

## 2022-10-07 DIAGNOSIS — Y95 Nosocomial condition: Secondary | ICD-10-CM | POA: Diagnosis present

## 2022-10-07 DIAGNOSIS — Z515 Encounter for palliative care: Secondary | ICD-10-CM | POA: Diagnosis not present

## 2022-10-07 DIAGNOSIS — F32A Depression, unspecified: Secondary | ICD-10-CM | POA: Diagnosis present

## 2022-10-07 DIAGNOSIS — R531 Weakness: Secondary | ICD-10-CM | POA: Diagnosis not present

## 2022-10-07 DIAGNOSIS — F418 Other specified anxiety disorders: Secondary | ICD-10-CM | POA: Diagnosis present

## 2022-10-07 DIAGNOSIS — M069 Rheumatoid arthritis, unspecified: Secondary | ICD-10-CM | POA: Diagnosis not present

## 2022-10-07 DIAGNOSIS — T45515A Adverse effect of anticoagulants, initial encounter: Secondary | ICD-10-CM | POA: Diagnosis not present

## 2022-10-07 DIAGNOSIS — I2510 Atherosclerotic heart disease of native coronary artery without angina pectoris: Secondary | ICD-10-CM | POA: Diagnosis present

## 2022-10-07 DIAGNOSIS — R042 Hemoptysis: Secondary | ICD-10-CM | POA: Diagnosis not present

## 2022-10-07 DIAGNOSIS — F0390 Unspecified dementia without behavioral disturbance: Secondary | ICD-10-CM | POA: Diagnosis present

## 2022-10-07 DIAGNOSIS — A419 Sepsis, unspecified organism: Principal | ICD-10-CM | POA: Diagnosis present

## 2022-10-07 DIAGNOSIS — Z79899 Other long term (current) drug therapy: Secondary | ICD-10-CM

## 2022-10-07 DIAGNOSIS — D696 Thrombocytopenia, unspecified: Secondary | ICD-10-CM | POA: Diagnosis not present

## 2022-10-07 DIAGNOSIS — N170 Acute kidney failure with tubular necrosis: Secondary | ICD-10-CM | POA: Diagnosis not present

## 2022-10-07 DIAGNOSIS — F0283 Dementia in other diseases classified elsewhere, unspecified severity, with mood disturbance: Secondary | ICD-10-CM | POA: Diagnosis present

## 2022-10-07 DIAGNOSIS — J9811 Atelectasis: Secondary | ICD-10-CM | POA: Diagnosis not present

## 2022-10-07 DIAGNOSIS — I4719 Other supraventricular tachycardia: Secondary | ICD-10-CM | POA: Diagnosis not present

## 2022-10-07 DIAGNOSIS — Z7189 Other specified counseling: Secondary | ICD-10-CM | POA: Diagnosis not present

## 2022-10-07 DIAGNOSIS — R0600 Dyspnea, unspecified: Secondary | ICD-10-CM | POA: Diagnosis not present

## 2022-10-07 DIAGNOSIS — R69 Illness, unspecified: Secondary | ICD-10-CM | POA: Diagnosis not present

## 2022-10-07 DIAGNOSIS — Z9181 History of falling: Secondary | ICD-10-CM

## 2022-10-07 DIAGNOSIS — Z8249 Family history of ischemic heart disease and other diseases of the circulatory system: Secondary | ICD-10-CM

## 2022-10-07 DIAGNOSIS — R55 Syncope and collapse: Secondary | ICD-10-CM | POA: Diagnosis present

## 2022-10-07 DIAGNOSIS — Z888 Allergy status to other drugs, medicaments and biological substances status: Secondary | ICD-10-CM

## 2022-10-07 DIAGNOSIS — R059 Cough, unspecified: Secondary | ICD-10-CM | POA: Diagnosis not present

## 2022-10-07 DIAGNOSIS — R109 Unspecified abdominal pain: Secondary | ICD-10-CM | POA: Diagnosis not present

## 2022-10-07 DIAGNOSIS — R000 Tachycardia, unspecified: Secondary | ICD-10-CM | POA: Diagnosis not present

## 2022-10-07 DIAGNOSIS — Z452 Encounter for adjustment and management of vascular access device: Secondary | ICD-10-CM | POA: Diagnosis not present

## 2022-10-07 LAB — COMPREHENSIVE METABOLIC PANEL
ALT: 8 U/L (ref 0–44)
ALT: 8 U/L (ref 0–44)
AST: 56 U/L — ABNORMAL HIGH (ref 15–41)
AST: 43 U/L — ABNORMAL HIGH (ref 15–41)
Albumin: 4.9 g/dL (ref 3.5–5.0)
Albumin: 2.7 g/dL — ABNORMAL LOW (ref 3.5–5.0)
Alkaline Phosphatase: 111 U/L (ref 38–126)
Alkaline Phosphatase: 110 U/L (ref 38–126)
Anion gap: 18 — ABNORMAL HIGH (ref 5–15)
Anion gap: 12 (ref 5–15)
BUN: 34 mg/dL — ABNORMAL HIGH (ref 8–23)
BUN: 35 mg/dL — ABNORMAL HIGH (ref 8–23)
CO2: 27 mmol/L (ref 22–32)
CO2: 21 mmol/L — ABNORMAL LOW (ref 22–32)
Calcium: 10.3 mg/dL (ref 8.9–10.3)
Calcium: 8.4 mg/dL — ABNORMAL LOW (ref 8.9–10.3)
Chloride: 91 mmol/L — ABNORMAL LOW (ref 98–111)
Chloride: 101 mmol/L (ref 98–111)
Creatinine, Ser: 1.89 mg/dL — ABNORMAL HIGH (ref 0.44–1.00)
Creatinine, Ser: 1.45 mg/dL — ABNORMAL HIGH (ref 0.44–1.00)
GFR, Estimated: 26 mL/min — ABNORMAL LOW (ref >60)
GFR, Estimated: 35 mL/min — ABNORMAL LOW (ref >60)
Glucose, Bld: 148 mg/dL — ABNORMAL HIGH (ref 70–99)
Glucose, Bld: 163 mg/dL — ABNORMAL HIGH (ref 70–99)
Potassium: 3.6 mmol/L (ref 3.5–5.1)
Potassium: 3.3 mmol/L — ABNORMAL LOW (ref 3.5–5.1)
Sodium: 136 mmol/L (ref 135–145)
Sodium: 134 mmol/L — ABNORMAL LOW (ref 135–145)
Total Bilirubin: 1.6 mg/dL — ABNORMAL HIGH (ref 0.3–1.2)
Total Bilirubin: 1.7 mg/dL — ABNORMAL HIGH (ref 0.3–1.2)
Total Protein: 8.1 g/dL (ref 6.5–8.1)
Total Protein: 4.9 g/dL — ABNORMAL LOW (ref 6.5–8.1)

## 2022-10-07 LAB — CBC WITH DIFFERENTIAL/PLATELET
Abs Immature Granulocytes: 0.07 10*3/uL (ref 0.00–0.07)
Basophils Absolute: 0.1 10*3/uL (ref 0.0–0.1)
Basophils Relative: 0 %
Eosinophils Absolute: 0.1 10*3/uL (ref 0.0–0.5)
Eosinophils Relative: 0 %
HCT: 49.8 % — ABNORMAL HIGH (ref 36.0–46.0)
Hemoglobin: 16.6 g/dL — ABNORMAL HIGH (ref 12.0–15.0)
Immature Granulocytes: 1 %
Lymphocytes Relative: 20 %
Lymphs Abs: 2.8 10*3/uL (ref 0.7–4.0)
MCH: 32.2 pg (ref 26.0–34.0)
MCHC: 33.3 g/dL (ref 30.0–36.0)
MCV: 96.5 fL (ref 80.0–100.0)
Monocytes Absolute: 0.7 10*3/uL (ref 0.1–1.0)
Monocytes Relative: 5 %
Neutro Abs: 10.6 10*3/uL — ABNORMAL HIGH (ref 1.7–7.7)
Neutrophils Relative %: 74 %
Platelets: 176 10*3/uL (ref 150–400)
RBC: 5.16 MIL/uL — ABNORMAL HIGH (ref 3.87–5.11)
RDW: 13 % (ref 11.5–15.5)
WBC: 14.3 10*3/uL — ABNORMAL HIGH (ref 4.0–10.5)
nRBC: 0 % (ref 0.0–0.2)

## 2022-10-07 LAB — URINALYSIS, COMPLETE (UACMP) WITH MICROSCOPIC
Bilirubin Urine: NEGATIVE
Glucose, UA: NEGATIVE mg/dL
Hgb urine dipstick: NEGATIVE
Ketones, ur: NEGATIVE mg/dL
Leukocytes,Ua: NEGATIVE
Nitrite: NEGATIVE
Protein, ur: 30 mg/dL — AB
Specific Gravity, Urine: 1.005 (ref 1.005–1.030)
pH: 8 (ref 5.0–8.0)

## 2022-10-07 LAB — LACTIC ACID, PLASMA
Lactic Acid, Venous: 2.5 mmol/L (ref 0.5–1.9)
Lactic Acid, Venous: 3.1 mmol/L (ref 0.5–1.9)
Lactic Acid, Venous: 3.3 mmol/L (ref 0.5–1.9)

## 2022-10-07 LAB — MAGNESIUM: Magnesium: 2.2 mg/dL (ref 1.7–2.4)

## 2022-10-07 LAB — LIPID PANEL
Cholesterol: 175 mg/dL (ref 0–200)
HDL: 38 mg/dL — ABNORMAL LOW (ref >40)
LDL Cholesterol: 118 mg/dL — ABNORMAL HIGH (ref 0–99)
Total CHOL/HDL Ratio: 4.6 RATIO
Triglycerides: 93 mg/dL (ref <150)
VLDL: 19 mg/dL (ref 0–40)

## 2022-10-07 LAB — RESP PANEL BY RT-PCR (FLU A&B, COVID) ARPGX2
Influenza A by PCR: NEGATIVE
Influenza B by PCR: NEGATIVE
SARS Coronavirus 2 by RT PCR: NEGATIVE

## 2022-10-07 LAB — CBG MONITORING, ED: Glucose-Capillary: 135 mg/dL — ABNORMAL HIGH (ref 70–99)

## 2022-10-07 LAB — D-DIMER, QUANTITATIVE: D-Dimer, Quant: 7.14 ug/mL-FEU — ABNORMAL HIGH (ref 0.00–0.50)

## 2022-10-07 LAB — TROPONIN I (HIGH SENSITIVITY)
Troponin I (High Sensitivity): 76 ng/L — ABNORMAL HIGH (ref <18)
Troponin I (High Sensitivity): 53 ng/L — ABNORMAL HIGH (ref <18)
Troponin I (High Sensitivity): 108 ng/L (ref <18)
Troponin I (High Sensitivity): 278 ng/L (ref <18)

## 2022-10-07 LAB — LIPASE, BLOOD: Lipase: 48 U/L (ref 11–51)

## 2022-10-07 LAB — BRAIN NATRIURETIC PEPTIDE: B Natriuretic Peptide: 132.5 pg/mL — ABNORMAL HIGH (ref 0.0–100.0)

## 2022-10-07 LAB — PROCALCITONIN: Procalcitonin: 0.12 ng/mL

## 2022-10-07 MED ORDER — LACTATED RINGERS IV BOLUS
1000.0000 mL | Freq: Once | INTRAVENOUS | Status: AC
  Administered 2022-10-07: 1000 mL via INTRAVENOUS

## 2022-10-07 MED ORDER — FESOTERODINE FUMARATE ER 4 MG PO TB24
4.0000 mg | ORAL_TABLET | Freq: Every day | ORAL | Status: DC
  Filled 2022-10-07 (×2): qty 1

## 2022-10-07 MED ORDER — ENTACAPONE 200 MG PO TABS
200.0000 mg | ORAL_TABLET | Freq: Every day | ORAL | Status: DC
  Administered 2022-10-08: 200 mg via ORAL
  Filled 2022-10-07 (×3): qty 1

## 2022-10-07 MED ORDER — HYDROCODONE-ACETAMINOPHEN 7.5-325 MG PO TABS
1.0000 | ORAL_TABLET | Freq: Four times a day (QID) | ORAL | Status: DC | PRN
  Administered 2022-10-09 – 2022-10-14 (×6): 1 via ORAL
  Filled 2022-10-07 (×7): qty 1

## 2022-10-07 MED ORDER — MEMANTINE HCL 5 MG PO TABS: 10.0000 mg | ORAL_TABLET | Freq: Two times a day (BID) | ORAL | Status: DC

## 2022-10-07 MED ORDER — ENOXAPARIN SODIUM 40 MG/0.4ML IJ SOSY
40.0000 mg | PREFILLED_SYRINGE | INTRAMUSCULAR | Status: DC
  Administered 2022-10-07: 40 mg via SUBCUTANEOUS
  Filled 2022-10-07: qty 0.4

## 2022-10-07 MED ORDER — ATORVASTATIN CALCIUM 20 MG PO TABS
20.0000 mg | ORAL_TABLET | Freq: Every day | ORAL | Status: DC
  Administered 2022-10-08 – 2022-10-19 (×12): 20 mg via ORAL
  Filled 2022-10-07 (×12): qty 1

## 2022-10-07 MED ORDER — DOCUSATE SODIUM 100 MG PO CAPS: 100.0000 mg | ORAL_CAPSULE | Freq: Every day | ORAL | Status: DC | PRN

## 2022-10-07 MED ORDER — SODIUM CHLORIDE 0.9 % IV SOLN
250.0000 mL | INTRAVENOUS | Status: DC
  Administered 2022-10-13: 250 mL via INTRAVENOUS

## 2022-10-07 MED ORDER — VANCOMYCIN HCL 2000 MG/400ML IV SOLN
2000.0000 mg | Freq: Once | INTRAVENOUS | Status: AC
  Administered 2022-10-07: 2000 mg via INTRAVENOUS
  Filled 2022-10-07: qty 400

## 2022-10-07 MED ORDER — PREGABALIN 75 MG PO CAPS: 150.0000 mg | ORAL_CAPSULE | Freq: Two times a day (BID) | ORAL | Status: DC

## 2022-10-07 MED ORDER — ONDANSETRON HCL 4 MG/2ML IJ SOLN: 4.0000 mg | Freq: Three times a day (TID) | INTRAMUSCULAR | Status: DC | PRN

## 2022-10-07 MED ORDER — CARBIDOPA-LEVODOPA 25-100 MG PO TABS
2.0000 | ORAL_TABLET | Freq: Three times a day (TID) | ORAL | Status: DC
  Filled 2022-10-07 (×2): qty 2

## 2022-10-07 MED ORDER — SODIUM CHLORIDE 0.9 % IV BOLUS
500.0000 mL | Freq: Once | INTRAVENOUS | Status: AC
  Administered 2022-10-07: 500 mL via INTRAVENOUS

## 2022-10-07 MED ORDER — FOLIC ACID 1 MG PO TABS
1.0000 mg | ORAL_TABLET | Freq: Every day | ORAL | Status: DC
  Administered 2022-10-09 – 2022-10-20 (×12): 1 mg via ORAL
  Filled 2022-10-07 (×12): qty 1

## 2022-10-07 MED ORDER — ADULT MULTIVITAMIN W/MINERALS CH
1.0000 | ORAL_TABLET | Freq: Every day | ORAL | Status: DC
  Administered 2022-10-09 – 2022-10-20 (×12): 1 via ORAL
  Filled 2022-10-07 (×12): qty 1

## 2022-10-07 MED ORDER — VANCOMYCIN VARIABLE DOSE PER UNSTABLE RENAL FUNCTION (PHARMACIST DOSING): Status: DC

## 2022-10-07 MED ORDER — SODIUM CHLORIDE 0.9 % IV SOLN
2.0000 g | Freq: Three times a day (TID) | INTRAVENOUS | Status: DC
  Administered 2022-10-07: 2 g via INTRAVENOUS
  Filled 2022-10-07: qty 10

## 2022-10-07 MED ORDER — CARBIDOPA-LEVODOPA 25-100 MG PO TABS
2.0000 | ORAL_TABLET | Freq: Every day | ORAL | Status: DC
  Filled 2022-10-07: qty 2

## 2022-10-07 MED ORDER — ACETAMINOPHEN 325 MG PO TABS
650.0000 mg | ORAL_TABLET | Freq: Four times a day (QID) | ORAL | Status: DC | PRN
  Administered 2022-10-09 – 2022-10-11 (×3): 650 mg via ORAL
  Filled 2022-10-07 (×3): qty 2

## 2022-10-07 MED ORDER — NOREPINEPHRINE 4 MG/250ML-% IV SOLN
2.0000 ug/min | INTRAVENOUS | Status: DC
  Administered 2022-10-07: 2 ug/min via INTRAVENOUS
  Filled 2022-10-07: qty 250

## 2022-10-07 MED ORDER — ASPIRIN 81 MG PO TBEC
81.0000 mg | DELAYED_RELEASE_TABLET | Freq: Every day | ORAL | Status: DC
  Administered 2022-10-09 – 2022-10-18 (×10): 81 mg via ORAL
  Filled 2022-10-07 (×12): qty 1

## 2022-10-07 MED ORDER — VITAMIN E 180 MG (400 UNIT) PO CAPS
400.0000 [IU] | ORAL_CAPSULE | Freq: Every day | ORAL | Status: DC
  Administered 2022-10-10 – 2022-10-20 (×9): 400 [IU] via ORAL
  Filled 2022-10-07 (×13): qty 1

## 2022-10-07 MED ORDER — METRONIDAZOLE 500 MG/100ML IV SOLN
500.0000 mg | Freq: Two times a day (BID) | INTRAVENOUS | Status: DC
  Administered 2022-10-08 – 2022-10-12 (×9): 500 mg via INTRAVENOUS
  Filled 2022-10-07 (×10): qty 100

## 2022-10-07 MED ORDER — SODIUM CHLORIDE 0.9 % IV BOLUS
1000.0000 mL | Freq: Once | INTRAVENOUS | Status: AC
Start: 2022-10-07 — End: 2022-10-07
  Administered 2022-10-07: 1000 mL via INTRAVENOUS

## 2022-10-07 MED ORDER — ALBUTEROL SULFATE (2.5 MG/3ML) 0.083% IN NEBU: 2.5000 mg | INHALATION_SOLUTION | RESPIRATORY_TRACT | Status: DC | PRN

## 2022-10-07 MED ORDER — CHLORHEXIDINE GLUCONATE CLOTH 2 % EX PADS
6.0000 | MEDICATED_PAD | Freq: Every day | CUTANEOUS | Status: DC
  Administered 2022-10-09 – 2022-10-12 (×4): 6 via TOPICAL

## 2022-10-07 MED ORDER — CALCIUM CARBONATE 1250 (500 CA) MG PO TABS
500.0000 mg | ORAL_TABLET | Freq: Every day | ORAL | Status: DC
  Administered 2022-10-09: 1250 mg via ORAL
  Filled 2022-10-07: qty 1

## 2022-10-07 MED ORDER — FAMOTIDINE 20 MG PO TABS: 20.0000 mg | ORAL_TABLET | Freq: Two times a day (BID) | ORAL | Status: DC

## 2022-10-07 MED ORDER — VITAMIN D 25 MCG (1000 UNIT) PO TABS
2000.0000 [IU] | ORAL_TABLET | Freq: Every day | ORAL | Status: DC
  Administered 2022-10-09: 2000 [IU] via ORAL
  Filled 2022-10-07: qty 2

## 2022-10-07 MED ORDER — VANCOMYCIN HCL IN DEXTROSE 1-5 GM/200ML-% IV SOLN
1000.0000 mg | Freq: Once | INTRAVENOUS | Status: DC
  Filled 2022-10-07: qty 200

## 2022-10-07 MED ORDER — SODIUM CHLORIDE 0.9 % IV SOLN: INTRAVENOUS | Status: DC

## 2022-10-07 MED ORDER — LACTATED RINGERS IV SOLN: INTRAVENOUS | Status: DC

## 2022-10-07 MED ORDER — HYDROCERIN EX CREA: 1.0000 | TOPICAL_CREAM | Freq: Every day | CUTANEOUS | Status: DC | PRN

## 2022-10-07 MED ORDER — MELATONIN 5 MG PO TABS
5.0000 mg | ORAL_TABLET | Freq: Every day | ORAL | Status: DC
  Administered 2022-10-08 – 2022-10-19 (×12): 5 mg via ORAL
  Filled 2022-10-07 (×12): qty 1

## 2022-10-07 MED ORDER — DULOXETINE HCL 30 MG PO CPEP
60.0000 mg | ORAL_CAPSULE | Freq: Every day | ORAL | Status: DC
  Filled 2022-10-07: qty 2

## 2022-10-07 MED ORDER — DONEPEZIL HCL 5 MG PO TABS: 10.0000 mg | ORAL_TABLET | Freq: Every day | ORAL | Status: DC

## 2022-10-07 MED ORDER — SODIUM CHLORIDE 0.9 % IV SOLN
1.0000 g | Freq: Three times a day (TID) | INTRAVENOUS | Status: DC
  Administered 2022-10-07 – 2022-10-12 (×14): 1 g via INTRAVENOUS
  Filled 2022-10-07: qty 1
  Filled 2022-10-07: qty 5
  Filled 2022-10-07 (×2): qty 1
  Filled 2022-10-07 (×3): qty 5
  Filled 2022-10-07 (×3): qty 1
  Filled 2022-10-07: qty 5
  Filled 2022-10-07: qty 1
  Filled 2022-10-07 (×2): qty 5
  Filled 2022-10-07: qty 1

## 2022-10-07 MED ORDER — DM-GUAIFENESIN ER 30-600 MG PO TB12: 1.0000 | ORAL_TABLET | Freq: Two times a day (BID) | ORAL | Status: DC | PRN

## 2022-10-07 MED ORDER — METRONIDAZOLE 500 MG/100ML IV SOLN
500.0000 mg | Freq: Once | INTRAVENOUS | Status: AC
  Administered 2022-10-07: 500 mg via INTRAVENOUS
  Filled 2022-10-07: qty 100

## 2022-10-07 MED ORDER — METOCLOPRAMIDE HCL 5 MG/ML IJ SOLN
10.0000 mg | Freq: Once | INTRAMUSCULAR | Status: AC
  Administered 2022-10-07: 10 mg via INTRAVENOUS
  Filled 2022-10-07: qty 2

## 2022-10-07 MED ORDER — CARBIDOPA-LEVODOPA-ENTACAPONE 50-200-200 MG PO TABS: 1.0000 | ORAL_TABLET | Freq: Every day | ORAL | Status: DC

## 2022-10-07 MED ORDER — VITAMIN C 500 MG PO TABS
1000.0000 mg | ORAL_TABLET | Freq: Every day | ORAL | Status: DC
  Administered 2022-10-09: 1000 mg via ORAL
  Filled 2022-10-07: qty 2

## 2022-10-07 NOTE — Progress Notes (Signed)
   PATIENT: Morgan Dennis EFE:[id number] DOB: 1934/01/08    Subjective: Nurse reports patient with hypotension and tachycardia,  Patient was admitted for severe sepsis with organ dysfunction and had received 3 L  IVF on vanc, lagyl and aztreonam  Objective: Vitals:   10/07/22 1630 10/07/22 1700  BP: (!) 88/75 114/64  Pulse: (!) 104 (!) 101  Resp: 18 15  Temp:    SpO2: 95% 95%   Recheck chem panel  Trend lactic Head ct ordered  Assessment:  Neuro - impaired swallow, alert, oriented to self, no overt focal deficits Pulm - diminished CV- ST GU - + urine output   Plan:  Septic Shock Gave additional total of 1 liter totaling 4 liters.  Discussed with son on phone need for vasopressors and transfer to stepdown with a likely need for central line placement needed.  He was in agreement with the plan Transfer to stepdown CCM consult for vasopressor therapy Continue to trend lactate Supportive IV fluids Optimize electrolytes  Kathlene Cote NP [facility]

## 2022-10-07 NOTE — ED Triage Notes (Signed)
[facility] reports pt coming from [facility] care side. Staff states pt with syncopal episode. Hypotensive upon EMS arrival. Pt w/dementia.
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
## 2022-10-07 NOTE — ED Provider Notes (Signed)
[facility] Provider Note    Event Date/Time   First MD Initiated Contact with Patient 10/07/22 1418     (approximate)   History   Loss of Consciousness   HPI  Morgan Dennis is a 88 y.o. female with history of Parkinson's disease, dementia, arthritis, dyslipidemia, chronic respiratory failure on O2 at night, and GERD, who presents with syncope and a low blood pressure.  The patient is unable to give much history except saying that she does not feel well.  EMS states that staff at [facility] reported a syncopal episode and the patient was hypotensive with EMS.  I reviewed the past medical records.  The patient was admitted last month and per the hospitalist discharge summary from 9/2 she presented with shortness of breath, hypoxia, and a positive COVID test.  She was treated with Paxlovid and steroids.   Physical Exam   Triage Vital Signs: ED Triage Vitals [10/07/22 1426]  Enc Vitals Group     BP (!) 90/52     Pulse Rate 91     Resp 18     Temp      Temp src      SpO2 95 %     Weight      Height      Head Circumference      Peak Flow      Pain Score      Pain Loc      Pain Edu?      Excl. in [place]?     Most recent vital signs: Vitals:   10/07/22 1426  BP: (!) 90/52  Pulse: 91  Resp: 18  SpO2: 95%     General: Alert, oriented x2, uncomfortable appearing but in no acute distress. CV:  Good peripheral perfusion.  Normal heart sounds. Resp:  Normal effort.  Lungs CTAB. Abd:  Soft with mild lower abdominal discomfort to palpation.  No focal tenderness or peritoneal signs.  No distention.  Other:  Slightly dry mucous membranes.  Motor intact in all extremities.   ED Results / Procedures / Treatments   Labs (all labs ordered are listed, but only abnormal results are displayed) Labs Reviewed  RESP PANEL BY RT-PCR (FLU A&B, COVID) ARPGX2  CULTURE, BLOOD (ROUTINE X 2)  CULTURE, BLOOD (ROUTINE X 2)  URINE CULTURE  COMPREHENSIVE METABOLIC  PANEL  CBC WITH DIFFERENTIAL/PLATELET  LACTIC ACID, PLASMA  LACTIC ACID, PLASMA  LIPASE, BLOOD  URINALYSIS, ROUTINE W REFLEX MICROSCOPIC  URINALYSIS, COMPLETE (UACMP) WITH MICROSCOPIC  TROPONIN I (HIGH SENSITIVITY)     EKG  ED ECG REPORT I, Arta Silence, the attending physician, personally viewed and interpreted this ECG.  Date: 10/07/2022 EKG Time: 1424 Rate: 88 Rhythm: normal sinus rhythm QRS Axis: normal Intervals: Prolonged QTc ST/T Wave abnormalities: normal Narrative Interpretation: no evidence of acute ischemia    RADIOLOGY  Chest x-ray: Pending  CT abdomen/pelvis: Pending  PROCEDURES:  Critical Care performed: Yes, see critical care procedure note(s)  .Critical Care  Performed by: Arta Silence, MD Authorized by: Arta Silence, MD   Critical care provider statement:    Critical care time (minutes):  30   Critical care was necessary to treat or prevent imminent or life-threatening deterioration of the following conditions:  Sepsis and circulatory failure   Critical care was time spent personally by me on the following activities:  Development of treatment plan with patient or surrogate, discussions with consultants, evaluation of patient's response to treatment, examination of patient, ordering  and review of laboratory studies, ordering and review of radiographic studies, ordering and performing treatments and interventions, pulse oximetry, re-evaluation of patient's condition and review of old [place] ED: Medications  metoCLOPramide (REGLAN) injection 10 mg (has no administration in time range)  sodium chloride 0.9 % bolus 1,000 mL (has no administration in time range)     IMPRESSION / MDM / [place] / ED COURSE  I reviewed the triage vital signs and the nursing notes.  88 year old female with PMH as noted above presents with apparent syncope and hypotension per EMS.  The patient endorses nausea  and appears somewhat uncomfortable and anxious, repeatedly saying "oh Lord" although cannot identify any other specific symptoms.  On exam she is borderline hypotensive with otherwise normal vital signs.  The abdomen is soft with mild lower abdominal discomfort but no focal tenderness.  His mucous membranes are dry.  Neurologic exam is nonfocal.  Differential diagnosis includes, but is not limited to, vasovagal syncope, dehydration/hypovolemia, HHS or other metabolic disturbance, acute infection/sepsis, gastroenteritis, colitis, or other intra-abdominal etiology, or less likely cardiac cause.  EKG shows prolonged QT with no ischemic changes.  We will obtain chest x-ray, CT abdomen/pelvis, lab work-up including sepsis evaluation, give fluids, and reassess.  Patient's presentation is most consistent with acute presentation with potential threat to life or bodily function.  The patient is on the cardiac monitor to evaluate for evidence of arrhythmia and/or significant heart rate changes.  ----------------------------------------- 3:10 PM on 10/07/2022 -----------------------------------------  Work-up is pending.  I have signed the patient out to the oncoming ED physician Dr. Archie Balboa.   FINAL CLINICAL IMPRESSION(S) / ED DIAGNOSES   Final diagnoses:  Syncope, unspecified syncope type     Rx / DC Orders   ED Discharge Orders     None        Note:  This document was prepared using Dragon voice recognition software and may include unintentional dictation errors.    Arta Silence, MD 10/07/22 1511
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
## 2022-10-07 NOTE — Progress Notes (Signed)
Pharmacy Antibiotic Note  Morgan Dennis is a 88 y.o. female with recent history of COVID, admitted on 10/07/2022 with possible pneumonia and acute colitis. Pharmacy has been consulted for vancomycin and aztreonam dosing.  Patient has listed anaphylactic penicillin allergy; no cephalosporin use in chart review. SCr upon admission 1.89 (baseline SCr <0.8). Will load vancomycin and redose per levels permit until AKI resolved and renal function stabilized.   Plan: Vancomycin 2g IV once (f/u random vanc level for maintenance dosing) Aztreonam 1 g IV q8h Monitor clinical picture, renal function, random vanc levels until SCr back to baseline F/U C&S, BMP, abx de-escalation, LOT   Weight: 84.9 kg (187 lb 2.7 oz) (per chl 08/2022)  Temp (24hrs), Avg:98 F (36.7 C), Min:98 F (36.7 C), Max:98 F (36.7 C)  Recent Labs  Lab 10/07/22 1501 10/07/22 1506 10/07/22 1700  WBC 14.3*  --   --   CREATININE 1.89*  --   --   LATICACIDVEN  --  2.5* 3.1*    Estimated Creatinine Clearance: 22.9 mL/min (A) (by C-G formula based on SCr of 1.89 mg/dL (H)).    Allergies  Allergen Reactions   Penicillins Swelling and Rash    Other reaction(s): Rash Has patient had a PCN reaction causing immediate rash, facial/tongue/throat swelling, SOB or lightheadedness with hypotension: Yes Has patient had a PCN reaction causing severe rash involving mucus membranes or skin necrosis: Unknown Has patient had a PCN reaction that required hospitalization:Yes Has patient had a PCN reaction occurring within the last 10 years: No If all of the above answers are "NO", then may proceed with Cephalosporin use.    Evista  [Raloxifene]     Other reaction(s): Joint Pains   Ibandronic Acid     Other reaction(s): Muscle Pain   Remicade [Infliximab] Other (See Comments)    Due to bleeding ulcer issue--MD advised her not to take   Risedronate Sodium     Other reaction(s): Joint Pains    Antimicrobials this admission: Vanc  10/21> Aztreonam 10/21> Metronidazole 10/21>  Dose adjustments this admission: Aztreonam 2g>1g q8h  Microbiology results: 10/21 BCx: sent 10/21 UCx: sent 10/21 RVP neg   Thank you for allowing pharmacy to be a part of this patient's care.  Brendolyn Patty, PharmD Clinical Pharmacist  10/07/2022   6:58 PM

## 2022-10-08 ENCOUNTER — Inpatient Hospital Stay: Payer: Medicare HMO

## 2022-10-08 ENCOUNTER — Other Ambulatory Visit: Payer: Medicare HMO

## 2022-10-08 DIAGNOSIS — R6520 Severe sepsis without septic shock: Secondary | ICD-10-CM | POA: Diagnosis not present

## 2022-10-08 DIAGNOSIS — A419 Sepsis, unspecified organism: Secondary | ICD-10-CM | POA: Diagnosis not present

## 2022-10-08 LAB — BASIC METABOLIC PANEL
Anion gap: 11 (ref 5–15)
BUN: 35 mg/dL — ABNORMAL HIGH (ref 8–23)
CO2: 24 mmol/L (ref 22–32)
Calcium: 7.9 mg/dL — ABNORMAL LOW (ref 8.9–10.3)
Chloride: 99 mmol/L (ref 98–111)
Creatinine, Ser: 1.47 mg/dL — ABNORMAL HIGH (ref 0.44–1.00)
GFR, Estimated: 35 mL/min — ABNORMAL LOW (ref >60)
Glucose, Bld: 182 mg/dL — ABNORMAL HIGH (ref 70–99)
Potassium: 3.3 mmol/L — ABNORMAL LOW (ref 3.5–5.1)
Sodium: 134 mmol/L — ABNORMAL LOW (ref 135–145)

## 2022-10-08 LAB — LACTIC ACID, PLASMA
Lactic Acid, Venous: 3.5 mmol/L (ref 0.5–1.9)
Lactic Acid, Venous: 2.3 mmol/L (ref 0.5–1.9)
Lactic Acid, Venous: 2.4 mmol/L (ref 0.5–1.9)

## 2022-10-08 LAB — CBC
HCT: 43.8 % (ref 36.0–46.0)
Hemoglobin: 14.3 g/dL (ref 12.0–15.0)
MCH: 32.1 pg (ref 26.0–34.0)
MCHC: 32.6 g/dL (ref 30.0–36.0)
MCV: 98.4 fL (ref 80.0–100.0)
Platelets: 155 10*3/uL (ref 150–400)
RBC: 4.45 MIL/uL (ref 3.87–5.11)
RDW: 13.2 % (ref 11.5–15.5)
WBC: 25.5 10*3/uL — ABNORMAL HIGH (ref 4.0–10.5)
nRBC: 0 % (ref 0.0–0.2)

## 2022-10-08 LAB — PROTIME-INR
INR: 1.5 — ABNORMAL HIGH (ref 0.8–1.2)
Prothrombin Time: 17.6 seconds — ABNORMAL HIGH (ref 11.4–15.2)

## 2022-10-08 LAB — HEPARIN LEVEL (UNFRACTIONATED)
Heparin Unfractionated: 1.1 IU/mL — ABNORMAL HIGH (ref 0.30–0.70)
Heparin Unfractionated: <0.1 IU/mL — ABNORMAL LOW (ref 0.30–0.70)

## 2022-10-08 LAB — MRSA NEXT GEN BY PCR, NASAL: MRSA by PCR Next Gen: NOT DETECTED

## 2022-10-08 LAB — GLUCOSE, CAPILLARY
Glucose-Capillary: 142 mg/dL — ABNORMAL HIGH (ref 70–99)
Glucose-Capillary: 149 mg/dL — ABNORMAL HIGH (ref 70–99)
Glucose-Capillary: 151 mg/dL — ABNORMAL HIGH (ref 70–99)
Glucose-Capillary: 162 mg/dL — ABNORMAL HIGH (ref 70–99)
Glucose-Capillary: 162 mg/dL — ABNORMAL HIGH (ref 70–99)
Glucose-Capillary: 166 mg/dL — ABNORMAL HIGH (ref 70–99)
Glucose-Capillary: 199 mg/dL — ABNORMAL HIGH (ref 70–99)

## 2022-10-08 LAB — STREP PNEUMONIAE URINARY ANTIGEN: Strep Pneumo Urinary Antigen: NEGATIVE

## 2022-10-08 LAB — HEMOGLOBIN A1C
Hgb A1c MFr Bld: 6 % — ABNORMAL HIGH (ref 4.8–5.6)
Mean Plasma Glucose: 125.5 mg/dL

## 2022-10-08 LAB — APTT
aPTT: 169 seconds (ref 24–36)
aPTT: 170 seconds (ref 24–36)

## 2022-10-08 LAB — T4, FREE: Free T4: 1.26 ng/dL — ABNORMAL HIGH (ref 0.61–1.12)

## 2022-10-08 LAB — VANCOMYCIN, RANDOM: Vancomycin Rm: 6 ug/mL

## 2022-10-08 LAB — CORTISOL-AM, BLOOD: Cortisol - AM: >100 ug/dL — ABNORMAL HIGH (ref 6.7–22.6)

## 2022-10-08 LAB — TSH: TSH: 2.109 u[IU]/mL (ref 0.350–4.500)

## 2022-10-08 LAB — PHOSPHORUS: Phosphorus: 4.3 mg/dL (ref 2.5–4.6)

## 2022-10-08 MED ORDER — FENTANYL CITRATE PF 50 MCG/ML IJ SOSY
PREFILLED_SYRINGE | INTRAMUSCULAR | Status: AC
  Administered 2022-10-08: 12.5 ug via INTRAVENOUS
  Filled 2022-10-08: qty 1

## 2022-10-08 MED ORDER — HEPARIN (PORCINE) 25000 UT/250ML-% IV SOLN
1200.0000 [IU]/h | INTRAVENOUS | Status: DC
  Administered 2022-10-08: 1200 [IU]/h via INTRAVENOUS
  Filled 2022-10-08: qty 250

## 2022-10-08 MED ORDER — ORAL CARE MOUTH RINSE: 15.0000 mL | OROMUCOSAL | Status: DC | PRN

## 2022-10-08 MED ORDER — POTASSIUM CHLORIDE 10 MEQ/100ML IV SOLN
10.0000 meq | INTRAVENOUS | Status: AC
  Administered 2022-10-08 (×4): 10 meq via INTRAVENOUS
  Filled 2022-10-08 (×4): qty 100

## 2022-10-08 MED ORDER — NOREPINEPHRINE 16 MG/250ML-% IV SOLN
0.0000 ug/min | INTRAVENOUS | Status: DC
  Administered 2022-10-08: 10 ug/min via INTRAVENOUS
  Filled 2022-10-08: qty 250

## 2022-10-08 MED ORDER — HEPARIN (PORCINE) 25000 UT/250ML-% IV SOLN: 1200.0000 [IU]/h | INTRAVENOUS | Status: DC

## 2022-10-08 MED ORDER — HYDROCORTISONE SOD SUC (PF) 100 MG IJ SOLR
100.0000 mg | Freq: Two times a day (BID) | INTRAMUSCULAR | Status: DC
  Administered 2022-10-08 – 2022-10-10 (×5): 100 mg via INTRAVENOUS
  Filled 2022-10-08 (×5): qty 2

## 2022-10-08 MED ORDER — IOHEXOL 350 MG/ML SOLN
50.0000 mL | Freq: Once | INTRAVENOUS | Status: AC | PRN
  Administered 2022-10-08: 50 mL via INTRAVENOUS

## 2022-10-08 MED ORDER — PREGABALIN 75 MG PO CAPS
150.0000 mg | ORAL_CAPSULE | Freq: Every day | ORAL | Status: DC
  Administered 2022-10-09 – 2022-10-19 (×11): 150 mg via ORAL
  Filled 2022-10-08 (×11): qty 2

## 2022-10-08 MED ORDER — FAMOTIDINE 20 MG PO TABS
20.0000 mg | ORAL_TABLET | Freq: Every day | ORAL | Status: DC
  Administered 2022-10-09 – 2022-10-19 (×11): 20 mg via ORAL
  Filled 2022-10-08 (×11): qty 1

## 2022-10-08 MED ORDER — FENTANYL CITRATE PF 50 MCG/ML IJ SOSY: 12.5000 ug | PREFILLED_SYRINGE | Freq: Once | INTRAMUSCULAR | Status: AC

## 2022-10-08 MED ORDER — FENTANYL CITRATE PF 50 MCG/ML IJ SOSY
12.5000 ug | PREFILLED_SYRINGE | INTRAMUSCULAR | Status: DC | PRN
  Administered 2022-10-08 – 2022-10-09 (×5): 12.5 ug via INTRAVENOUS
  Filled 2022-10-08 (×5): qty 1

## 2022-10-08 MED ORDER — VASOPRESSIN 20 UNITS/100 ML INFUSION FOR SHOCK
0.0000 [IU]/min | INTRAVENOUS | Status: DC
  Administered 2022-10-08: 0.03 [IU]/min via INTRAVENOUS
  Filled 2022-10-08: qty 100

## 2022-10-08 MED ORDER — HEPARIN (PORCINE) 25000 UT/250ML-% IV SOLN
1200.0000 [IU]/h | INTRAVENOUS | Status: DC
  Administered 2022-10-08: 1000 [IU]/h via INTRAVENOUS
  Administered 2022-10-09 – 2022-10-10 (×2): 1050 [IU]/h via INTRAVENOUS
  Administered 2022-10-11 – 2022-10-12 (×2): 1200 [IU]/h via INTRAVENOUS
  Filled 2022-10-08 (×4): qty 250

## 2022-10-08 MED ORDER — LACTATED RINGERS IV BOLUS
1000.0000 mL | Freq: Once | INTRAVENOUS | Status: AC
  Administered 2022-10-08: 1000 mL via INTRAVENOUS
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
## 2022-10-08 NOTE — Progress Notes (Signed)
[place] for heparin infusion Indication: pulmonary embolus  Allergies  Allergen Reactions   Penicillins Swelling and Rash    Other reaction(s): Rash Has patient had a PCN reaction causing immediate rash, facial/tongue/throat swelling, SOB or lightheadedness with hypotension: Yes Has patient had a PCN reaction causing severe rash involving mucus membranes or skin necrosis: Unknown Has patient had a PCN reaction that required hospitalization:Yes Has patient had a PCN reaction occurring within the last 10 years: No If all of the above answers are "NO", then may proceed with Cephalosporin use.    Evista  [Raloxifene]     Other reaction(s): Joint Pains   Ibandronic Acid     Other reaction(s): Muscle Pain   Remicade [Infliximab] Other (See Comments)    Due to bleeding ulcer issue--MD advised her not to take   Risedronate Sodium     Other reaction(s): Joint Pains    Patient Measurements: Height: '5\' 4"'$  (162.6 cm) Weight: 80.4 kg (177 lb 4 oz) IBW/kg (Calculated) : 54.7 Heparin Dosing Weight: 72 kg  Vital Signs: Temp: 99.8 F (37.7 C) (10/22 0037) Temp Source: Oral (10/22 0037) BP: 95/56 (10/22 0100) Pulse Rate: 111 (10/22 0100)  Labs: Recent Labs    10/07/22 1501 10/07/22 1700 10/07/22 2037 10/07/22 2223  HGB 16.6*  --   --   --   HCT 49.8*  --   --   --   PLT 176  --   --   --   CREATININE 1.89*  --   --  1.45*  TROPONINIHS 76* 53* 108* 278*    Estimated Creatinine Clearance: 29.1 mL/min (A) (by C-G formula based on SCr of 1.45 mg/dL (H)).   Medical History: Past Medical History:  Diagnosis Date   Arthritis    GERD (gastroesophageal reflux disease)    Hyperlipidemia    Parkinson disease ([place])    Restless leg syndrome    Medications: Pt initial ordered and given enoxaparin 40 mg 10/21 @ 2108.  Assessment: Pt is a 88 yo female admitted w/ possible pneumonia & acute colitis, now found w/ "tiny acute pulmonary emboli  within the lower lobes bilaterally."  Goal of Therapy:  Heparin level 0.3-0.7 units/ml Monitor platelets by anticoagulation protocol: Yes   Plan:  No initial bolus due to enoxaparin Start heparin infusion at 1200 units/hr Will check HL/aPTT/INR in 8 hr after start of infusion CBC daily while on heparin  Renda Rolls, PharmD, [facility] 10/08/2022 1:22 AM

## 2022-10-08 NOTE — Progress Notes (Signed)
[place] for heparin infusion Indication: pulmonary embolus  Allergies  Allergen Reactions   Penicillins Swelling and Rash    Other reaction(s): Rash Has patient had a PCN reaction causing immediate rash, facial/tongue/throat swelling, SOB or lightheadedness with hypotension: Yes Has patient had a PCN reaction causing severe rash involving mucus membranes or skin necrosis: Unknown Has patient had a PCN reaction that required hospitalization:Yes Has patient had a PCN reaction occurring within the last 10 years: No If all of the above answers are "NO", then may proceed with Cephalosporin use.    Evista  [Raloxifene]     Other reaction(s): Joint Pains   Ibandronic Acid     Other reaction(s): Muscle Pain   Remicade [Infliximab] Other (See Comments)    Due to bleeding ulcer issue--MD advised her not to take   Risedronate Sodium     Other reaction(s): Joint Pains    Patient Measurements: Height: '5\' 4"'$  (162.6 cm) Weight: 80.4 kg (177 lb 4 oz) IBW/kg (Calculated) : 54.7 Heparin Dosing Weight: 72 kg  Vital Signs: Temp: 99.7 F (37.6 C) (10/22 2000) Temp Source: Axillary (10/22 2000) BP: 111/53 (10/22 2300) Pulse Rate: 102 (10/22 2300)  Labs: Recent Labs    10/07/22 1501 10/07/22 1700 10/07/22 2037 10/07/22 2223 10/08/22 0115 10/08/22 1021 10/08/22 1244 10/08/22 2254  HGB 16.6*  --   --   --  14.3  --   --   --   HCT 49.8*  --   --   --  43.8  --   --   --   PLT 176  --   --   --  155  --   --   --   APTT  --   --   --   --   --  169* 170*  --   LABPROT  --   --   --   --   --  17.6*  --   --   INR  --   --   --   --   --  1.5*  --   --   HEPARINUNFRC  --   --   --   --   --  1.10*  --  <0.10*  CREATININE 1.89*  --   --  1.45* 1.47*  --   --   --   TROPONINIHS 76* 53* 108* 278*  --   --   --   --      Estimated Creatinine Clearance: 28.7 mL/min (A) (by C-G formula based on SCr of 1.47 mg/dL (H)).   Medical History: Past  Medical History:  Diagnosis Date   Arthritis    GERD (gastroesophageal reflux disease)    Hyperlipidemia    Parkinson disease ([place])    Restless leg syndrome    Medications: Pt initial ordered and given enoxaparin 40 mg 10/21 @ 2108.  Assessment: Pt is a 88 yo female admitted w/ possible pneumonia & acute colitis, now found w/ "tiny acute pulmonary emboli within the lower lobes bilaterally."  Goal of Therapy:  Heparin level 0.3-0.7 units/ml Monitor platelets by anticoagulation protocol: Yes  Date   aPTT/HL Rate 10/22  169/>1.1 1200 units 10/22 2254 HL < 0.1 Restart at 1000 units/hr   Plan:  Contacted RN Restart infusion at decreased rate of 1000 units/hr Will check HL in 8 hr after re-initiation CBC daily while on heparin  Renda Rolls, PharmD, [facility] 10/08/2022 11:52 PM

## 2022-10-08 NOTE — Procedures (Signed)
Central Venous Catheter Insertion Procedure Note  ADONICA FUKUSHIMA  [id number]  January 30, 1934  Date:10/08/22  Time:4:09 AM   Provider Performing:Shondrika Hoque A Shenita Trego   Procedure: Insertion of Non-tunneled Central Venous Catheter(36556) with US guidance (10272)   Indication(s) Medication administration and Difficult access  Consent Unable to obtain consent due to emergent nature of procedure.  Anesthesia Topical only with 1% lidocaine   Timeout Verified patient identification, verified procedure, site/side was marked, verified correct patient position, special equipment/implants available, medications/allergies/relevant history reviewed, required imaging and test results available.  Sterile Technique Maximal sterile technique including full sterile barrier drape, hand hygiene, sterile gown, sterile gloves, mask, hair covering, sterile ultrasound probe cover (if used).  Procedure Description Area of catheter insertion was cleaned with chlorhexidine and draped in sterile fashion.  With real-time ultrasound guidance a central venous catheter was placed into the right subclavian vein. Nonpulsatile blood flow and easy flushing noted in all ports.  The catheter was sutured in place and sterile dressing applied.  Complications/Tolerance None; patient tolerated the procedure well. Chest X-ray is ordered to verify placement for internal jugular or subclavian cannulation.   Chest x-ray is not ordered for femoral cannulation.  EBL Minimal  Specimen(s) None   Rufina Falco, DNP, CCRN, FNP-C, AGACNP-BC Acute Care & Family Nurse Practitioner  [facility]  See Amion for personal pager PCCM on call pager [phone] until 7 am

## 2022-10-08 NOTE — Progress Notes (Signed)
[place] for heparin infusion Indication: pulmonary embolus  Allergies  Allergen Reactions   Penicillins Swelling and Rash    Other reaction(s): Rash Has patient had a PCN reaction causing immediate rash, facial/tongue/throat swelling, SOB or lightheadedness with hypotension: Yes Has patient had a PCN reaction causing severe rash involving mucus membranes or skin necrosis: Unknown Has patient had a PCN reaction that required hospitalization:Yes Has patient had a PCN reaction occurring within the last 10 years: No If all of the above answers are "NO", then may proceed with Cephalosporin use.    Evista  [Raloxifene]     Other reaction(s): Joint Pains   Ibandronic Acid     Other reaction(s): Muscle Pain   Remicade [Infliximab] Other (See Comments)    Due to bleeding ulcer issue--MD advised her not to take   Risedronate Sodium     Other reaction(s): Joint Pains    Patient Measurements: Height: '5\' 4"'$  (162.6 cm) Weight: 80.4 kg (177 lb 4 oz) IBW/kg (Calculated) : 54.7 Heparin Dosing Weight: 72 kg  Vital Signs: Temp: 99.7 F (37.6 C) (10/22 1200) Temp Source: Axillary (10/22 1200) BP: 118/67 (10/22 1500) Pulse Rate: 69 (10/22 1500)  Labs: Recent Labs    10/07/22 1501 10/07/22 1700 10/07/22 2037 10/07/22 2223 10/08/22 0115 10/08/22 1021 10/08/22 1244  HGB 16.6*  --   --   --  14.3  --   --   HCT 49.8*  --   --   --  43.8  --   --   PLT 176  --   --   --  155  --   --   APTT  --   --   --   --   --  169* 170*  LABPROT  --   --   --   --   --  17.6*  --   INR  --   --   --   --   --  1.5*  --   HEPARINUNFRC  --   --   --   --   --  1.10*  --   CREATININE 1.89*  --   --  1.45* 1.47*  --   --   TROPONINIHS 76* 53* 108* 278*  --   --   --      Estimated Creatinine Clearance: 28.7 mL/min (A) (by C-G formula based on SCr of 1.47 mg/dL (H)).   Medical History: Past Medical History:  Diagnosis Date   Arthritis    GERD  (gastroesophageal reflux disease)    Hyperlipidemia    Parkinson disease ([place])    Restless leg syndrome    Medications: Pt initial ordered and given enoxaparin 40 mg 10/21 @ 2108.  Assessment: Pt is a 88 yo female admitted w/ possible pneumonia & acute colitis, now found w/ "tiny acute pulmonary emboli within the lower lobes bilaterally."  Goal of Therapy:  Heparin level 0.3-0.7 units/ml Monitor platelets by anticoagulation protocol: Yes  Date   aPTT/HL Rate 10/22  169/>1.1 1200 units   Plan:  MD instructed RN to hold heparin infusion until 2300 HL draw. If HL is not supratherapeutic at this time, ok to restart infusion at decreased rate of 1000 units/hr Will check HL in 8 hr after re-initiation CBC daily while on heparin  Darrick Penna, PharmD Clinical Pharmacist 10/08/2022 6:21 PM

## 2022-10-08 NOTE — Progress Notes (Signed)
An USGPIV (ultrasound guided PIV) has been placed for short-term vasopressor infusion. A correctly placed ivWatch must be used when administering Vasopressors. Should this treatment be needed beyond 72 hours, central line access should be obtained.  It will be the responsibility of the bedside nurse to follow best practice to prevent extravasations.   ?

## 2022-10-08 NOTE — Progress Notes (Signed)
[place] for heparin infusion Indication: pulmonary embolus  Allergies  Allergen Reactions   Penicillins Swelling and Rash    Other reaction(s): Rash Has patient had a PCN reaction causing immediate rash, facial/tongue/throat swelling, SOB or lightheadedness with hypotension: Yes Has patient had a PCN reaction causing severe rash involving mucus membranes or skin necrosis: Unknown Has patient had a PCN reaction that required hospitalization:Yes Has patient had a PCN reaction occurring within the last 10 years: No If all of the above answers are "NO", then may proceed with Cephalosporin use.    Evista  [Raloxifene]     Other reaction(s): Joint Pains   Ibandronic Acid     Other reaction(s): Muscle Pain   Remicade [Infliximab] Other (See Comments)    Due to bleeding ulcer issue--MD advised her not to take   Risedronate Sodium     Other reaction(s): Joint Pains    Patient Measurements: Height: '5\' 4"'$  (162.6 cm) Weight: 80.4 kg (177 lb 4 oz) IBW/kg (Calculated) : 54.7 Heparin Dosing Weight: 72 kg  Vital Signs: Temp: 99.7 F (37.6 C) (10/22 1200) Temp Source: Axillary (10/22 1200) BP: 107/68 (10/22 1330) Pulse Rate: 113 (10/22 1330)  Labs: Recent Labs    10/07/22 1501 10/07/22 1700 10/07/22 2037 10/07/22 2223 10/08/22 0115 10/08/22 1021 10/08/22 1244  HGB 16.6*  --   --   --  14.3  --   --   HCT 49.8*  --   --   --  43.8  --   --   PLT 176  --   --   --  155  --   --   APTT  --   --   --   --   --  169* 170*  LABPROT  --   --   --   --   --  17.6*  --   INR  --   --   --   --   --  1.5*  --   HEPARINUNFRC  --   --   --   --   --  1.10*  --   CREATININE 1.89*  --   --  1.45* 1.47*  --   --   TROPONINIHS 76* 53* 108* 278*  --   --   --      Estimated Creatinine Clearance: 28.7 mL/min (A) (by C-G formula based on SCr of 1.47 mg/dL (H)).   Medical History: Past Medical History:  Diagnosis Date   Arthritis    GERD  (gastroesophageal reflux disease)    Hyperlipidemia    Parkinson disease ([place])    Restless leg syndrome    Medications: Pt initial ordered and given enoxaparin 40 mg 10/21 @ 2108.  Assessment: Pt is a 88 yo female admitted w/ possible pneumonia & acute colitis, now found w/ "tiny acute pulmonary emboli within the lower lobes bilaterally."  Goal of Therapy:  Heparin level 0.3-0.7 units/ml Monitor platelets by anticoagulation protocol: Yes  Date   aPTT/HL Rate 10/22  169/>1.1 1200 units   Plan:  Spoke with nurse, instructed her to hold heparin infusion for 1 hour then restart at 1000 units/hr Will check HL in 8 hr after re-initiation CBC daily while on heparin  Pearla Dubonnet, PharmD Clinical Pharmacist 10/08/2022 2:19 PM

## 2022-10-08 NOTE — Progress Notes (Signed)
eLink Physician-Brief Progress Note Patient Name: Morgan Dennis DOB: [birth year] MRN: [id number]   Date of Service  10/08/2022  HPI/Events of Note  88 yr old woman with multiple medical problems, recent covid and now with hypotension, sepsis. On fluids and antibiotics and in ICU for further care.   eICU Interventions  Follow cultures, on antibiotics Noted ? Colitis on CT versus diarrhea If present then would check and consider C diff in differential  Trend lactate On IV heparin for small acute PE found on CT  Call E link if needed DNR status noted Seen on camera. CCM consulted and their note is pending     Intervention Category Major Interventions: Sepsis - evaluation and management Evaluation Type: New Patient Evaluation  Zac Torti G Ashni Lonzo 10/08/2022, 2:01 AM

## 2022-10-09 DIAGNOSIS — G20A2 Parkinson's disease without dyskinesia, with fluctuations: Secondary | ICD-10-CM | POA: Diagnosis not present

## 2022-10-09 DIAGNOSIS — N179 Acute kidney failure, unspecified: Secondary | ICD-10-CM | POA: Diagnosis not present

## 2022-10-09 DIAGNOSIS — A419 Sepsis, unspecified organism: Secondary | ICD-10-CM | POA: Diagnosis not present

## 2022-10-09 DIAGNOSIS — K529 Noninfective gastroenteritis and colitis, unspecified: Secondary | ICD-10-CM | POA: Diagnosis not present

## 2022-10-09 LAB — BASIC METABOLIC PANEL
Anion gap: 7 (ref 5–15)
BUN: 23 mg/dL (ref 8–23)
CO2: 24 mmol/L (ref 22–32)
Calcium: 7.5 mg/dL — ABNORMAL LOW (ref 8.9–10.3)
Chloride: 107 mmol/L (ref 98–111)
Creatinine, Ser: 0.75 mg/dL (ref 0.44–1.00)
GFR, Estimated: >60 mL/min (ref >60)
Glucose, Bld: 154 mg/dL — ABNORMAL HIGH (ref 70–99)
Potassium: 2.8 mmol/L — ABNORMAL LOW (ref 3.5–5.1)
Sodium: 138 mmol/L (ref 135–145)

## 2022-10-09 LAB — PHOSPHORUS: Phosphorus: 1.5 mg/dL — ABNORMAL LOW (ref 2.5–4.6)

## 2022-10-09 LAB — GLUCOSE, CAPILLARY
Glucose-Capillary: 125 mg/dL — ABNORMAL HIGH (ref 70–99)
Glucose-Capillary: 143 mg/dL — ABNORMAL HIGH (ref 70–99)
Glucose-Capillary: <10 mg/dL — CL (ref 70–99)
Glucose-Capillary: 152 mg/dL — ABNORMAL HIGH (ref 70–99)
Glucose-Capillary: 128 mg/dL — ABNORMAL HIGH (ref 70–99)
Glucose-Capillary: 132 mg/dL — ABNORMAL HIGH (ref 70–99)
Glucose-Capillary: 165 mg/dL — ABNORMAL HIGH (ref 70–99)

## 2022-10-09 LAB — APTT: aPTT: 100 seconds — ABNORMAL HIGH (ref 24–36)

## 2022-10-09 LAB — LEGIONELLA PNEUMOPHILA SEROGP 1 UR AG: L. pneumophila Serogp 1 Ur Ag: NEGATIVE

## 2022-10-09 LAB — HEPARIN LEVEL (UNFRACTIONATED)
Heparin Unfractionated: 0.3 IU/mL (ref 0.30–0.70)
Heparin Unfractionated: 0.41 IU/mL (ref 0.30–0.70)

## 2022-10-09 LAB — MAGNESIUM: Magnesium: 2 mg/dL (ref 1.7–2.4)

## 2022-10-09 LAB — POTASSIUM: Potassium: 3.1 mmol/L — ABNORMAL LOW (ref 3.5–5.1)

## 2022-10-09 MED ORDER — POTASSIUM PHOSPHATES 15 MMOLE/5ML IV SOLN
30.0000 mmol | Freq: Once | INTRAVENOUS | Status: AC
  Administered 2022-10-09: 30 mmol via INTRAVENOUS
  Filled 2022-10-09: qty 10

## 2022-10-09 MED ORDER — CARBIDOPA-LEVODOPA-ENTACAPONE 50-200-200 MG PO TABS: 1.0000 | ORAL_TABLET | Freq: Every day | ORAL | Status: DC

## 2022-10-09 MED ORDER — CARBIDOPA-LEVODOPA 25-100 MG PO TABS
2.0000 | ORAL_TABLET | Freq: Every day | ORAL | Status: DC
  Administered 2022-10-09 – 2022-10-19 (×10): 2 via ORAL
  Filled 2022-10-09 (×11): qty 2

## 2022-10-09 MED ORDER — POTASSIUM CHLORIDE 10 MEQ/100ML IV SOLN
10.0000 meq | INTRAVENOUS | Status: AC
  Administered 2022-10-09 (×4): 10 meq via INTRAVENOUS
  Filled 2022-10-09 (×4): qty 100

## 2022-10-09 MED ORDER — DONEPEZIL HCL 5 MG PO TABS
10.0000 mg | ORAL_TABLET | Freq: Every day | ORAL | Status: DC
  Administered 2022-10-09 – 2022-10-19 (×11): 10 mg via ORAL
  Filled 2022-10-09 (×11): qty 2

## 2022-10-09 MED ORDER — MEMANTINE HCL 5 MG PO TABS
10.0000 mg | ORAL_TABLET | Freq: Two times a day (BID) | ORAL | Status: DC
  Administered 2022-10-09 – 2022-10-20 (×23): 10 mg via ORAL
  Filled 2022-10-09 (×9): qty 2
  Filled 2022-10-09: qty 1
  Filled 2022-10-09 (×2): qty 2
  Filled 2022-10-09 (×2): qty 1
  Filled 2022-10-09: qty 2
  Filled 2022-10-09 (×4): qty 1
  Filled 2022-10-09 (×4): qty 2

## 2022-10-09 MED ORDER — DULOXETINE HCL 30 MG PO CPEP
60.0000 mg | ORAL_CAPSULE | Freq: Every day | ORAL | Status: DC
  Administered 2022-10-11 – 2022-10-20 (×10): 60 mg via ORAL
  Filled 2022-10-09 (×11): qty 2

## 2022-10-09 MED ORDER — VANCOMYCIN HCL 750 MG/150ML IV SOLN: 750.0000 mg | INTRAVENOUS | Status: DC

## 2022-10-09 MED ORDER — ENTACAPONE 200 MG PO TABS
200.0000 mg | ORAL_TABLET | Freq: Every day | ORAL | Status: DC
  Administered 2022-10-09 – 2022-10-19 (×10): 200 mg via ORAL
  Filled 2022-10-09 (×12): qty 1

## 2022-10-09 MED ORDER — CARBIDOPA-LEVODOPA 25-100 MG PO TABS
2.0000 | ORAL_TABLET | Freq: Three times a day (TID) | ORAL | Status: DC
  Administered 2022-10-09 – 2022-10-20 (×32): 2 via ORAL
  Filled 2022-10-09 (×33): qty 2

## 2022-10-09 MED ORDER — VANCOMYCIN HCL 1500 MG/300ML IV SOLN
1500.0000 mg | Freq: Once | INTRAVENOUS | Status: AC
  Administered 2022-10-09: 1500 mg via INTRAVENOUS
  Filled 2022-10-09: qty 300

## 2022-10-09 MED ORDER — METOPROLOL TARTRATE 25 MG PO TABS
25.0000 mg | ORAL_TABLET | Freq: Two times a day (BID) | ORAL | Status: DC
  Administered 2022-10-09 – 2022-10-20 (×21): 25 mg via ORAL
  Filled 2022-10-09 (×21): qty 1

## 2022-10-09 NOTE — Inpatient Diabetes Management (Signed)
Inpatient Diabetes Program Recommendations  AACE/ADA: New Consensus Statement on Inpatient Glycemic Control   Target Ranges:  Prepandial:   less than 140 mg/dL      Peak postprandial:   less than 180 mg/dL (1-2 hours)      Critically ill patients:  140 - 180 mg/dL    Latest Reference Range & Units 10/09/22 03:45 10/09/22 07:50 10/09/22 07:52  Glucose-Capillary 70 - 99 mg/dL 125 (H) <10 (LL) 143 (H)    Latest Reference Range & Units 10/08/22 07:16 10/08/22 11:15 10/08/22 15:09 10/08/22 19:55 10/08/22 23:20  Glucose-Capillary 70 - 99 mg/dL 199 (H) 151 (H) 142 (H) 162 (H) 166 (H)   Review of Glycemic Control  Diabetes history: No Outpatient Diabetes medications: NA Current orders for Inpatient glycemic control: None; Solucortef 1000 mg Q12H  Inpatient Diabetes Program Recommendations:    Insulin: While ordered steroids, may want to consider ordering CBGs Q4H with Novolog 0-6 units Q4H.  NOTE: Noted glucose <10 at 7:50 am and 143 mg/dl at 7:52 am today. Anticipate <10 CBG error since CBG 2 minutes later was 143 mg/dl. May want to consider ordering very sensitive Novolog correction scale Q4H while ordered steroids.  Thanks, Morgan Alderman, RN, MSN, [place] [facility] Coordinator Inpatient Diabetes Program [phone] (Team Pager from 8am to [place])

## 2022-10-09 NOTE — Progress Notes (Signed)
SLP Cancellation Note  Patient Details Name: Morgan Dennis MRN: [id number] DOB: 1934-09-30   Cancelled treatment:       Reason Eval/Treat Not Completed: Fatigue/lethargy limiting ability to participate. SLP attempted to see pt for follow up dysphagia intervention. Pt lethargic this AM, limiting safety for PO intake. Per RN, pt doing well with medications in puree and small portions of meals. SLP spoke with MD, discussed high risk of aspiration given breathing pattern and caution with PO intake. MD to discuss potential palliative consult. SLP will continue to monitor.   [place] Pharrah Rottman Clapp MS [facility] SLP   [place] J Clapp 10/09/2022, 8:56 AM

## 2022-10-09 NOTE — Progress Notes (Signed)
Pharmacy Antibiotic Note  Morgan Dennis is a 88 y.o. female with recent history of COVID, admitted on 10/07/2022 with possible pneumonia and acute colitis. Pharmacy has been consulted for vancomycin and aztreonam dosing.  Patient has listed anaphylactic penicillin allergy; no cephalosporin use in chart review. SCr upon admission 1.89 (baseline SCr <0.8). Will load vancomycin and redose per levels permit until AKI resolved and renal function stabilized.   Plan: Vancomycin 2g IV once (f/u random vanc level for maintenance dosing) Aztreonam 1 g IV q8h Monitor clinical picture, renal function, random vanc levels until SCr back to baseline F/U C&S, BMP, abx de-escalation, LOT  10/22 2254 Vanc Random = 6 Ordered Vancomycin 1500 mg once followed by Vancomycin 750 mg IV Q 24 hrs. Goal AUC 400-550. Expected AUC: 529 SCr used: 1.47  Height: '5\' 4"'$  (162.6 cm) Weight: 80.4 kg (177 lb 4 oz) IBW/kg (Calculated) : 54.7  Temp (24hrs), Avg:99.7 F (37.6 C), Min:99 F (37.2 C), Max:100.5 F (38.1 C)  Recent Labs  Lab 10/07/22 1501 10/07/22 1506 10/07/22 1700 10/07/22 2037 10/07/22 2223 10/08/22 0115 10/08/22 0302 10/08/22 0558 10/08/22 2254  WBC 14.3*  --   --   --   --  25.5*  --   --   --   CREATININE 1.89*  --   --   --  1.45* 1.47*  --   --   --   LATICACIDVEN  --    < > 3.1* 3.3*  --  3.5* 2.3* 2.4*  --   VANCORANDOM  --   --   --   --   --   --   --   --  6   < > = values in this interval not displayed.     Estimated Creatinine Clearance: 28.7 mL/min (A) (by C-G formula based on SCr of 1.47 mg/dL (H)).    Allergies  Allergen Reactions   Penicillins Swelling and Rash    Other reaction(s): Rash Has patient had a PCN reaction causing immediate rash, facial/tongue/throat swelling, SOB or lightheadedness with hypotension: Yes Has patient had a PCN reaction causing severe rash involving mucus membranes or skin necrosis: Unknown Has patient had a PCN reaction that required  hospitalization:Yes Has patient had a PCN reaction occurring within the last 10 years: No If all of the above answers are "NO", then may proceed with Cephalosporin use.    Evista  [Raloxifene]     Other reaction(s): Joint Pains   Ibandronic Acid     Other reaction(s): Muscle Pain   Remicade [Infliximab] Other (See Comments)    Due to bleeding ulcer issue--MD advised her not to take   Risedronate Sodium     Other reaction(s): Joint Pains    Antimicrobials this admission: Vanc 10/21> Aztreonam 10/21> Metronidazole 10/21>  Dose adjustments this admission: Aztreonam 2g>1g q8h  Microbiology results: 10/21 BCx: sent 10/21 UCx: sent 10/21 RVP neg   Thank you for allowing pharmacy to be a part of this patient's care.  Renda Rolls, PharmD, MBA 10/09/2022 12:12 AM

## 2022-10-09 NOTE — Progress Notes (Signed)
Speech Language Pathology Treatment: Dysphagia  Patient Details Name: Morgan Dennis MRN: [id number] DOB: [birth year] Today's Date: 10/09/2022 Time: 1025-1040 SLP Time Calculation (min) (ACUTE ONLY): 15 min  Assessment / Plan / Recommendation Clinical Impression  Pt seen for follow up dysphagia intervention. Pt's son present for duration of session. Prior to [place] session, RN reporting increased challenge with oral manipulation of more viscous solids (pureed meat vs applesauce). Pt resting in bed on 6L O2, O2 saturations between 91-94 for duration of trials. Pt seen with trials of thin liquids (ice cream and thin liquids). No s/sx of aspiration. Pt with noted increased challenge for coordination for pulling from the straw, leading to dip in O2 saturations to 89. Moderate verbal and tactile cues provided for attention and to task and sustained alertness. Education provided to son regarding rationale for diet recommendation and monitoring for respiratory stability, alertness, and comfort during PO intake. Son reported understanding.   Recommend alteration of diet to full liquids, administered via spoon for increased ease and bolus size control. STRICT aspiration precautions (slow rate, small bites, elevated HOB, and alert for PO intake). SLP will continue to monitor.    HPI HPI: Per H&P: TIFINI Morgan Dennis is a 88 y.o. female with medical history significant of hypertension, hyperlipidemia, GERD, depression with anxiety, Parkinson's disease, dementia, rheumatoid arthritis, recent admission due to COVID-19 infection on 2 L oxygen, who presents with syncope, cough, abdominal pain. CT Head 10/08/22: No acute intracranial pathology. Mild age-related atrophy and chronic microvascular ischemic changes. CXR 10/08/22: Right internal jugular central venous catheter in appropriate position. No pneumothorax. Pulmonary hypoinflation. Pt with family in the room who reported that pt was on an unrestricted diet at baseline. Pt  resting on 6L O2 via nasal canula. Pt currently on puree solids and thin liquids.      SLP Plan  Continue with current plan of care      Recommendations for follow up therapy are one component of a multi-disciplinary discharge planning process, led by the attending physician.  Recommendations may be updated based on patient status, additional functional criteria and insurance authorization.    Recommendations  Diet recommendations: Thin liquid;Other(comment) (full liquid) Liquids provided via: Teaspoon;No straw Medication Administration: Crushed with puree Supervision: Full supervision/cueing for compensatory strategies;Staff to assist with self feeding Compensations: Minimize environmental distractions;Slow rate;Small sips/bites (monitor for alertness) Postural Changes and/or Swallow Maneuvers: Seated upright 90 degrees                Oral Care Recommendations: Oral care BID Follow Up Recommendations:  (TBD) Assistance recommended at discharge: Frequent or constant Supervision/Assistance SLP Visit Diagnosis: Dysphagia, oropharyngeal phase (R13.12) Plan: Continue with current plan of care         Morgan Otis Burress Clapp MS [facility] SLP   Morgan Dennis  10/09/2022, 10:52 AM

## 2022-10-09 NOTE — Progress Notes (Signed)
[facility] for heparin infusion Indication: pulmonary embolus  Allergies  Allergen Reactions   Penicillins Swelling and Rash    Other reaction(s): Rash Has patient had a PCN reaction causing immediate rash, facial/tongue/throat swelling, SOB or lightheadedness with hypotension: Yes Has patient had a PCN reaction causing severe rash involving mucus membranes or skin necrosis: Unknown Has patient had a PCN reaction that required hospitalization:Yes Has patient had a PCN reaction occurring within the last 10 years: No If all of the above answers are "NO", then may proceed with Cephalosporin use.    Evista  [Raloxifene]     Other reaction(s): Joint Pains   Ibandronic Acid     Other reaction(s): Muscle Pain   Remicade [Infliximab] Other (See Comments)    Due to bleeding ulcer issue--MD advised her not to take   Risedronate Sodium     Other reaction(s): Joint Pains    Patient Measurements: Height: '5\' 4"'$  (162.6 cm) Weight: 80.4 kg (177 lb 4 oz) IBW/kg (Calculated) : 54.7 Heparin Dosing Weight: 72 kg  Vital Signs: Temp: 98.2 F (36.8 C) (10/23 0400) Temp Source: Axillary (10/23 0400) BP: 112/48 (10/23 0600) Pulse Rate: 96 (10/23 0600)  Labs: Recent Labs    10/07/22 1501 10/07/22 1700 10/07/22 2037 10/07/22 2223 10/08/22 0115 10/08/22 1021 10/08/22 1244 10/08/22 2254  HGB 16.6*  --   --   --  14.3  --   --   --   HCT 49.8*  --   --   --  43.8  --   --   --   PLT 176  --   --   --  155  --   --   --   APTT  --   --   --   --   --  169* 170*  --   LABPROT  --   --   --   --   --  17.6*  --   --   INR  --   --   --   --   --  1.5*  --   --   HEPARINUNFRC  --   --   --   --   --  1.10*  --  <0.10*  CREATININE 1.89*  --   --  1.45* 1.47*  --   --   --   TROPONINIHS 76* 53* 108* 278*  --   --   --   --      Estimated Creatinine Clearance: 28.7 mL/min (A) (by C-G formula based on SCr of 1.47 mg/dL (H)).   Medical History: Past  Medical History:  Diagnosis Date   Arthritis    GERD (gastroesophageal reflux disease)    Hyperlipidemia    Parkinson disease ([place])    Restless leg syndrome    Medications: Pt initial ordered and given enoxaparin 40 mg 10/21 @ 2108.  Assessment: Pt is a 88 yo female admitted w/ possible pneumonia & acute colitis, now found w/ "tiny acute pulmonary emboli within the lower lobes bilaterally."  Goal of Therapy:  Heparin level 0.3-0.7 units/ml Monitor platelets by anticoagulation protocol: Yes    Plan: heparin level therapeutic but borderline  increase heparin infusion rate slightly to 1050 units/hr Will check heparin level 8 hours after rate change CBC daily while on heparin  Vallery Sa, PharmD, BCPS 10/09/2022 7:12 AM

## 2022-10-09 NOTE — Progress Notes (Signed)
[facility] for heparin infusion Indication: pulmonary embolus  Allergies  Allergen Reactions   Penicillins Swelling and Rash    Other reaction(s): Rash Has patient had a PCN reaction causing immediate rash, facial/tongue/throat swelling, SOB or lightheadedness with hypotension: Yes Has patient had a PCN reaction causing severe rash involving mucus membranes or skin necrosis: Unknown Has patient had a PCN reaction that required hospitalization:Yes Has patient had a PCN reaction occurring within the last 10 years: No If all of the above answers are "NO", then may proceed with Cephalosporin use.    Evista  [Raloxifene]     Other reaction(s): Joint Pains   Ibandronic Acid     Other reaction(s): Muscle Pain   Remicade [Infliximab] Other (See Comments)    Due to bleeding ulcer issue--MD advised her not to take   Risedronate Sodium     Other reaction(s): Joint Pains    Patient Measurements: Height: '5\' 4"'$  (162.6 cm) Weight: 80.4 kg (177 lb 4 oz) IBW/kg (Calculated) : 54.7 Heparin Dosing Weight: 72 kg  Vital Signs: Temp: 98.1 F (36.7 C) (10/23 1600) Temp Source: Axillary (10/23 1600) BP: 92/47 (10/23 1800) Pulse Rate: 85 (10/23 1800)  Labs: Recent Labs    10/07/22 1501 10/07/22 1700 10/07/22 2037 10/07/22 2223 10/08/22 0115 10/08/22 1021 10/08/22 1021 10/08/22 1244 10/08/22 2254 10/09/22 0742 10/09/22 1828  HGB 16.6*  --   --   --  14.3  --   --   --   --   --   --   HCT 49.8*  --   --   --  43.8  --   --   --   --   --   --   PLT 176  --   --   --  155  --   --   --   --   --   --   APTT  --   --   --   --   --  169*  --  170*  --  100*  --   LABPROT  --   --   --   --   --  17.6*  --   --   --   --   --   INR  --   --   --   --   --  1.5*  --   --   --   --   --   HEPARINUNFRC  --   --   --   --   --  1.10*   < >  --  <0.10* 0.30 0.41  CREATININE 1.89*  --   --  1.45* 1.47*  --   --   --   --  0.75  --   TROPONINIHS 76* 53*  108* 278*  --   --   --   --   --   --   --    < > = values in this interval not displayed.     Estimated Creatinine Clearance: 52.8 mL/min (by C-G formula based on SCr of 0.75 mg/dL).   Medical History: Past Medical History:  Diagnosis Date   Arthritis    GERD (gastroesophageal reflux disease)    Hyperlipidemia    Parkinson disease ([place])    Restless leg syndrome    Medications: Pt initial ordered and given enoxaparin 40 mg 10/21 @ 2108.  Assessment: Pt is a 88 yo female admitted w/ possible pneumonia & acute  colitis, now found w/ "tiny acute pulmonary emboli within the lower lobes bilaterally."  10/23 1828 HL= 0.41   therapeutic  Goal of Therapy:  Heparin level 0.3-0.7 units/ml Monitor platelets by anticoagulation protocol: Yes    Plan: 10/23 1828 HL= 0.41    heparin level therapeutic Will continue heparin infusion rate at 1050 units/hr Will check heparin level with am labs CBC daily while on heparin  Chinita Greenland PharmD Clinical Pharmacist 10/09/2022
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
## 2022-10-09 NOTE — Progress Notes (Signed)
[place] for Electrolyte Monitoring and Replacement   Recent Labs: Potassium (mmol/L)  Date Value  10/09/2022 2.8 (L)   Magnesium (mg/dL)  Date Value  10/07/2022 2.2   Calcium (mg/dL)  Date Value  10/09/2022 7.5 (L)   Albumin (g/dL)  Date Value  10/07/2022 2.7 (L)  06/07/2021 4.3   Phosphorus (mg/dL)  Date Value  10/08/2022 4.3   Sodium (mmol/L)  Date Value  10/09/2022 138  06/07/2021 139     Assessment: 88 y.o. female w/ PMH of HTN, HLD, depression with anxiety, Parkinson's disease, dementia, rheumatoid arthritis, recent admission due to COVID-19 infection on 2 L oxygen, who presents with syncope, cough, abdominal pain. Pharmacy is asked to follow and replace electrolytes while in CCU  Goal of Therapy:  Electrolytes WNL  Plan:  10 mEq IV KCl x 4 Recheck electrolytes in am  Dallie Piles ,PharmD Clinical Pharmacist 10/09/2022 8:27 AM

## 2022-10-10 DIAGNOSIS — N179 Acute kidney failure, unspecified: Secondary | ICD-10-CM | POA: Diagnosis not present

## 2022-10-10 DIAGNOSIS — K529 Noninfective gastroenteritis and colitis, unspecified: Secondary | ICD-10-CM | POA: Diagnosis not present

## 2022-10-10 DIAGNOSIS — A419 Sepsis, unspecified organism: Secondary | ICD-10-CM | POA: Diagnosis not present

## 2022-10-10 DIAGNOSIS — F0390 Unspecified dementia without behavioral disturbance: Secondary | ICD-10-CM | POA: Diagnosis not present

## 2022-10-10 LAB — CBC WITH DIFFERENTIAL/PLATELET
Abs Immature Granulocytes: 0.64 10*3/uL — ABNORMAL HIGH (ref 0.00–0.07)
Basophils Absolute: 0.1 10*3/uL (ref 0.0–0.1)
Basophils Relative: 0 %
Eosinophils Absolute: 0.1 10*3/uL (ref 0.0–0.5)
Eosinophils Relative: 0 %
HCT: 30.7 % — ABNORMAL LOW (ref 36.0–46.0)
Hemoglobin: 10.1 g/dL — ABNORMAL LOW (ref 12.0–15.0)
Immature Granulocytes: 3 %
Lymphocytes Relative: 8 %
Lymphs Abs: 2.1 10*3/uL (ref 0.7–4.0)
MCH: 32.8 pg (ref 26.0–34.0)
MCHC: 32.9 g/dL (ref 30.0–36.0)
MCV: 99.7 fL (ref 80.0–100.0)
Monocytes Absolute: 2.1 10*3/uL — ABNORMAL HIGH (ref 0.1–1.0)
Monocytes Relative: 8 %
Neutro Abs: 20.6 10*3/uL — ABNORMAL HIGH (ref 1.7–7.7)
Neutrophils Relative %: 81 %
Platelets: 86 10*3/uL — ABNORMAL LOW (ref 150–400)
RBC: 3.08 MIL/uL — ABNORMAL LOW (ref 3.87–5.11)
RDW: 13.8 % (ref 11.5–15.5)
Smear Review: NORMAL
WBC: 25.5 10*3/uL — ABNORMAL HIGH (ref 4.0–10.5)
nRBC: 0 % (ref 0.0–0.2)

## 2022-10-10 LAB — URINE CULTURE: Culture: 30000 — AB

## 2022-10-10 LAB — MAGNESIUM: Magnesium: 2.1 mg/dL (ref 1.7–2.4)

## 2022-10-10 LAB — BASIC METABOLIC PANEL
Anion gap: 4 — ABNORMAL LOW (ref 5–15)
BUN: 23 mg/dL (ref 8–23)
CO2: 27 mmol/L (ref 22–32)
Calcium: 7.3 mg/dL — ABNORMAL LOW (ref 8.9–10.3)
Chloride: 108 mmol/L (ref 98–111)
Creatinine, Ser: 0.69 mg/dL (ref 0.44–1.00)
GFR, Estimated: >60 mL/min (ref >60)
Glucose, Bld: 87 mg/dL (ref 70–99)
Potassium: 3.3 mmol/L — ABNORMAL LOW (ref 3.5–5.1)
Sodium: 139 mmol/L (ref 135–145)

## 2022-10-10 LAB — GLUCOSE, CAPILLARY
Glucose-Capillary: 103 mg/dL — ABNORMAL HIGH (ref 70–99)
Glucose-Capillary: 114 mg/dL — ABNORMAL HIGH (ref 70–99)
Glucose-Capillary: 118 mg/dL — ABNORMAL HIGH (ref 70–99)
Glucose-Capillary: 140 mg/dL — ABNORMAL HIGH (ref 70–99)
Glucose-Capillary: 87 mg/dL (ref 70–99)
Glucose-Capillary: 99 mg/dL (ref 70–99)

## 2022-10-10 LAB — PHOSPHORUS
Phosphorus: 2.2 mg/dL — ABNORMAL LOW (ref 2.5–4.6)
Phosphorus: 3 mg/dL (ref 2.5–4.6)
Phosphorus: 2 mg/dL — ABNORMAL LOW (ref 2.5–4.6)
Phosphorus: 1.6 mg/dL — ABNORMAL LOW (ref 2.5–4.6)

## 2022-10-10 LAB — POTASSIUM
Potassium: 3.5 mmol/L (ref 3.5–5.1)
Potassium: 3.4 mmol/L — ABNORMAL LOW (ref 3.5–5.1)
Potassium: 3.6 mmol/L (ref 3.5–5.1)

## 2022-10-10 LAB — HEPARIN LEVEL (UNFRACTIONATED): Heparin Unfractionated: 0.41 IU/mL (ref 0.30–0.70)

## 2022-10-10 MED ORDER — POTASSIUM & SODIUM PHOSPHATES 280-160-250 MG PO PACK
2.0000 | PACK | Freq: Once | ORAL | Status: AC
  Administered 2022-10-10: 2 via ORAL
  Filled 2022-10-10: qty 2

## 2022-10-10 MED ORDER — POTASSIUM CHLORIDE 20 MEQ PO PACK
40.0000 meq | PACK | Freq: Once | ORAL | Status: AC
  Administered 2022-10-10: 40 meq via ORAL
  Filled 2022-10-10: qty 2

## 2022-10-10 MED ORDER — NYSTATIN 100000 UNIT/ML MT SUSP
5.0000 mL | Freq: Four times a day (QID) | OROMUCOSAL | Status: DC
  Administered 2022-10-10 – 2022-10-20 (×37): 500000 [IU] via ORAL
  Filled 2022-10-10 (×37): qty 5

## 2022-10-10 MED ORDER — HYDROCORTISONE SOD SUC (PF) 100 MG IJ SOLR
50.0000 mg | Freq: Two times a day (BID) | INTRAMUSCULAR | Status: DC
  Administered 2022-10-11 – 2022-10-14 (×7): 50 mg via INTRAVENOUS
  Filled 2022-10-10 (×4): qty 1
  Filled 2022-10-10 (×3): qty 2

## 2022-10-10 MED ORDER — SODIUM PHOSPHATES 45 MMOLE/15ML IV SOLN
45.0000 mmol | Freq: Once | INTRAVENOUS | Status: AC
  Administered 2022-10-10: 45 mmol via INTRAVENOUS
  Filled 2022-10-10: qty 15

## 2022-10-10 NOTE — Plan of Care (Signed)
Neuro: Continues to be confused and disoriented, redirectable but unable to reorient, moves weakly in bed, per son her saying "oh Lord." Is normal and does not tend to mean anything in particular Resp: stable on 6L [place] CV: afebrile, vital signs stable, Levo off first thing this morning with an occasional low BP, HR 60-70s throughout the day, little generalized edema GIGU: external catheter in place though not consistently efficient, BM x 1, tolerating little PO, complaining of mouth pain, notified Dr. Genia Harold for possible thrush-nystatin to be ordered  Skin: intact, scattered skin tags otherwise unremarkable Social: both sons have visited today, all questions and concerns addressed, palliative care consult requested to help guide them with goals of care and options that they have for their mother, they are very concerned about her going to rehab and not tolerating, do not want her to be bedridden for a long period of time and understand the patient has numerous co-morbidities.  Problem: Activity: Goal: Ability to tolerate increased activity will improve Outcome: Not Progressing   Problem: Clinical Measurements: Goal: Ability to maintain a body temperature in the normal range will improve Outcome: Not Progressing   Problem: Respiratory: Goal: Ability to maintain adequate ventilation will improve Outcome: Not Progressing Goal: Ability to maintain a clear airway will improve Outcome: Not Progressing   Problem: Education: Goal: Knowledge of General Education information will improve Description: Including pain rating scale, medication(s)/side effects and non-pharmacologic comfort measures Outcome: Not Progressing   Problem: Health Behavior/Discharge Planning: Goal: Ability to manage health-related needs will improve Outcome: Not Progressing   Problem: Clinical Measurements: Goal: Ability to maintain clinical measurements within normal limits will improve Outcome: Not Progressing Goal: Will  remain free from infection Outcome: Not Progressing Goal: Diagnostic test results will improve Outcome: Not Progressing Goal: Respiratory complications will improve Outcome: Not Progressing Goal: Cardiovascular complication will be avoided Outcome: Not Progressing   Problem: Activity: Goal: Risk for activity intolerance will decrease Outcome: Not Progressing   Problem: Nutrition: Goal: Adequate nutrition will be maintained Outcome: Not Progressing   Problem: Coping: Goal: Level of anxiety will decrease Outcome: Not Progressing   Problem: Elimination: Goal: Will not experience complications related to bowel motility Outcome: Not Progressing Goal: Will not experience complications related to urinary retention Outcome: Not Progressing   Problem: Pain Managment: Goal: General experience of comfort will improve Outcome: Not Progressing   Problem: Safety: Goal: Ability to remain free from injury will improve Outcome: Not Progressing   Problem: Skin Integrity: Goal: Risk for impaired skin integrity will decrease Outcome: Not Progressing
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
## 2022-10-10 NOTE — Progress Notes (Signed)
[facility] for Electrolyte Monitoring and Replacement   Recent Labs: Potassium (mmol/L)  Date Value  10/10/2022 3.3 (L)  10/10/2022 3.5   Magnesium (mg/dL)  Date Value  10/10/2022 2.1   Calcium (mg/dL)  Date Value  10/10/2022 7.3 (L)   Albumin (g/dL)  Date Value  10/07/2022 2.7 (L)  06/07/2021 4.3   Phosphorus (mg/dL)  Date Value  10/10/2022 2.2 (L)  10/10/2022 3.0   Sodium (mmol/L)  Date Value  10/10/2022 139  06/07/2021 139     Assessment: 88 y.o. female w/ PMH of HTN, HLD, depression with anxiety, Parkinson's disease, dementia, rheumatoid arthritis, recent admission due to COVID-19 infection on 2 L oxygen, who presents with syncope, cough, abdominal pain. Pharmacy is asked to follow and replace electrolytes while in CCU  Goal of Therapy:  Electrolytes WNL  Plan:  40 mEq po KCl x 1 Phos-Nak 2 packets po x 1 (contains 500 mg elemental phosphorus) Recheck electrolytes in am  Dallie Piles ,PharmD Clinical Pharmacist 10/10/2022 7:18 AM

## 2022-10-10 NOTE — Progress Notes (Signed)
[facility] for heparin infusion Indication: pulmonary embolus  Allergies  Allergen Reactions   Penicillins Swelling and Rash    Other reaction(s): Rash Has patient had a PCN reaction causing immediate rash, facial/tongue/throat swelling, SOB or lightheadedness with hypotension: Yes Has patient had a PCN reaction causing severe rash involving mucus membranes or skin necrosis: Unknown Has patient had a PCN reaction that required hospitalization:Yes Has patient had a PCN reaction occurring within the last 10 years: No If all of the above answers are "NO", then may proceed with Cephalosporin use.    Evista  [Raloxifene]     Other reaction(s): Joint Pains   Ibandronic Acid     Other reaction(s): Muscle Pain   Remicade [Infliximab] Other (See Comments)    Due to bleeding ulcer issue--MD advised her not to take   Risedronate Sodium     Other reaction(s): Joint Pains    Patient Measurements: Height: '5\' 4"'$  (162.6 cm) Weight: 80.4 kg (177 lb 4 oz) IBW/kg (Calculated) : 54.7 Heparin Dosing Weight: 72 kg  Vital Signs: Temp: 98.2 F (36.8 C) (10/24 0000) Temp Source: Axillary (10/24 0000) BP: 85/51 (10/24 0000) Pulse Rate: 75 (10/24 0000)  Labs: Recent Labs    10/07/22 1501 10/07/22 1700 10/07/22 2037 10/07/22 2223 10/08/22 0115 10/08/22 1021 10/08/22 1244 10/08/22 2254 10/09/22 0742 10/09/22 1828 10/10/22 0101  HGB 16.6*  --   --   --  14.3  --   --   --   --   --   --   HCT 49.8*  --   --   --  43.8  --   --   --   --   --   --   PLT 176  --   --   --  155  --   --   --   --   --   --   APTT  --   --   --   --   --  169* 170*  --  100*  --   --   LABPROT  --   --   --   --   --  17.6*  --   --   --   --   --   INR  --   --   --   --   --  1.5*  --   --   --   --   --   HEPARINUNFRC  --   --   --   --   --  1.10*  --    < > 0.30 0.41 0.41  CREATININE 1.89*  --   --  1.45* 1.47*  --   --   --  0.75  --   --   TROPONINIHS 76* 53* 108*  278*  --   --   --   --   --   --   --    < > = values in this interval not displayed.     Estimated Creatinine Clearance: 52.8 mL/min (by C-G formula based on SCr of 0.75 mg/dL).   Medical History: Past Medical History:  Diagnosis Date   Arthritis    GERD (gastroesophageal reflux disease)    Hyperlipidemia    Parkinson disease ([place])    Restless leg syndrome    Medications: Pt initial ordered and given enoxaparin 40 mg 10/21 @ 2108.  Assessment: Pt is a 88 yo female admitted w/ possible pneumonia & acute  colitis, now found w/ "tiny acute pulmonary emboli within the lower lobes bilaterally."  10/23  1828  HL= 0.41   therapeutic   Goal of Therapy:  Heparin level 0.3-0.7 units/ml Monitor platelets by anticoagulation protocol: Yes    Plan:  - 10/24 0101* HL= 0.41, therapeutic x 2 - spoke to lab, believes that the heparin level vial they received this morning was incorrectly labeled by nurse on the floor who drew the lab and that it was received at 0548 this morning. - Will continue heparin infusion rate at 1050 units/hr - Recheck heparin level with am labs - CBC daily while on heparin  Pearla Dubonnet, PharmD Clinical Pharmacist 10/10/2022 6:38 AM

## 2022-10-11 DIAGNOSIS — R6520 Severe sepsis without septic shock: Secondary | ICD-10-CM | POA: Diagnosis not present

## 2022-10-11 DIAGNOSIS — Z515 Encounter for palliative care: Secondary | ICD-10-CM

## 2022-10-11 DIAGNOSIS — A419 Sepsis, unspecified organism: Secondary | ICD-10-CM | POA: Diagnosis not present

## 2022-10-11 DIAGNOSIS — M069 Rheumatoid arthritis, unspecified: Secondary | ICD-10-CM

## 2022-10-11 DIAGNOSIS — R55 Syncope and collapse: Secondary | ICD-10-CM | POA: Diagnosis not present

## 2022-10-11 DIAGNOSIS — Z66 Do not resuscitate: Secondary | ICD-10-CM

## 2022-10-11 DIAGNOSIS — N179 Acute kidney failure, unspecified: Secondary | ICD-10-CM | POA: Diagnosis not present

## 2022-10-11 DIAGNOSIS — K529 Noninfective gastroenteritis and colitis, unspecified: Secondary | ICD-10-CM | POA: Diagnosis not present

## 2022-10-11 LAB — GLUCOSE, CAPILLARY
Glucose-Capillary: 113 mg/dL — ABNORMAL HIGH (ref 70–99)
Glucose-Capillary: 121 mg/dL — ABNORMAL HIGH (ref 70–99)
Glucose-Capillary: 161 mg/dL — ABNORMAL HIGH (ref 70–99)
Glucose-Capillary: 136 mg/dL — ABNORMAL HIGH (ref 70–99)
Glucose-Capillary: 102 mg/dL — ABNORMAL HIGH (ref 70–99)
Glucose-Capillary: 149 mg/dL — ABNORMAL HIGH (ref 70–99)

## 2022-10-11 LAB — CBC WITH DIFFERENTIAL/PLATELET
Abs Immature Granulocytes: 0.29 10*3/uL — ABNORMAL HIGH (ref 0.00–0.07)
Basophils Absolute: 0 10*3/uL (ref 0.0–0.1)
Basophils Relative: 0 %
Eosinophils Absolute: 0.2 10*3/uL (ref 0.0–0.5)
Eosinophils Relative: 1 %
HCT: 28.8 % — ABNORMAL LOW (ref 36.0–46.0)
Hemoglobin: 9.6 g/dL — ABNORMAL LOW (ref 12.0–15.0)
Immature Granulocytes: 2 %
Lymphocytes Relative: 11 %
Lymphs Abs: 1.7 10*3/uL (ref 0.7–4.0)
MCH: 32.2 pg (ref 26.0–34.0)
MCHC: 33.3 g/dL (ref 30.0–36.0)
MCV: 96.6 fL (ref 80.0–100.0)
Monocytes Absolute: 1.3 10*3/uL — ABNORMAL HIGH (ref 0.1–1.0)
Monocytes Relative: 8 %
Neutro Abs: 12.9 10*3/uL — ABNORMAL HIGH (ref 1.7–7.7)
Neutrophils Relative %: 78 %
Platelets: 85 10*3/uL — ABNORMAL LOW (ref 150–400)
RBC: 2.98 MIL/uL — ABNORMAL LOW (ref 3.87–5.11)
RDW: 14 % (ref 11.5–15.5)
WBC: 16.4 10*3/uL — ABNORMAL HIGH (ref 4.0–10.5)
nRBC: 0 % (ref 0.0–0.2)

## 2022-10-11 LAB — MAGNESIUM: Magnesium: 2.2 mg/dL (ref 1.7–2.4)

## 2022-10-11 LAB — BASIC METABOLIC PANEL
Anion gap: 5 (ref 5–15)
BUN: 23 mg/dL (ref 8–23)
CO2: 25 mmol/L (ref 22–32)
Calcium: 7.4 mg/dL — ABNORMAL LOW (ref 8.9–10.3)
Chloride: 111 mmol/L (ref 98–111)
Creatinine, Ser: 0.58 mg/dL (ref 0.44–1.00)
GFR, Estimated: >60 mL/min (ref >60)
Glucose, Bld: 115 mg/dL — ABNORMAL HIGH (ref 70–99)
Potassium: 3.5 mmol/L (ref 3.5–5.1)
Sodium: 141 mmol/L (ref 135–145)

## 2022-10-11 LAB — HEPARIN LEVEL (UNFRACTIONATED)
Heparin Unfractionated: 0.28 IU/mL — ABNORMAL LOW (ref 0.30–0.70)
Heparin Unfractionated: 0.5 IU/mL (ref 0.30–0.70)
Heparin Unfractionated: 0.54 IU/mL (ref 0.30–0.70)

## 2022-10-11 LAB — PHOSPHORUS: Phosphorus: 3.7 mg/dL (ref 2.5–4.6)

## 2022-10-11 MED ORDER — HEPARIN BOLUS VIA INFUSION
1100.0000 [IU] | Freq: Once | INTRAVENOUS | Status: AC
  Administered 2022-10-11: 1100 [IU] via INTRAVENOUS
  Filled 2022-10-11: qty 1100

## 2022-10-11 NOTE — Consult Note (Signed)
Consultation Note Date: 10/11/2022   Patient Name: Morgan Dennis  DOB: 11-Dec-1934  MRN: [id number]  Age / Sex: 88 y.o., female  PCP: Jerrol Banana., MD Referring Physician: Shawna Clamp, MD  Reason for Consultation: Establishing goals of care  HPI/Patient Profile: 88 y.o. female  with past medical history of HTN, HLD, Parkinson's, dementia, anxiety, depression, rheumatoid arthritis, and recent COVID-19 infection (hospitalization 8/31-9/2, d/c w/Paxlovid/steroids/2L [place]) admitted on 10/07/2022 with syncope and hypotension.  Patient is being treated for acute on chronic hypoxic respiratory failure, and shock likely from colitis/hypotension.   PMT was consulted to assist with [place].   Clinical Assessment and Goals of Care: I have reviewed medical records including EPIC notes, labs and imaging, assessed the patient (alert and oriented to self but unable to participate in goals of care discussions independently due to dementia) and then spoke with patient's son [name] over the phone to discuss diagnosis prognosis, GOC, EOL wishes, disposition and options.  I introduced Palliative Medicine as specialized medical care for people living with serious illness. It focuses on providing relief from the symptoms and stress of a serious illness. The goal is to improve quality of life for both the patient and the family.  We discussed a brief life review of the patient.  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  [name] shares that patient lost her husband/his father last year.  Patient has 2 sons, [name] and [name], who live nearby and are heavily involved in her care.  Patient resides at a memory care unit at [facility].  Manage says the patient has no memory but is often pleasantly confused/content.  He shares he and his brother were concerned that patient had a grave prognosis a few days  ago but now appears to be improving back to baseline.  We discussed patient's current illness and what it means in the larger context of patient's on-going co-morbidities. I attempted to elicit values and goals of care important to the patient.  [name] shares that he and his brother are in agreement to avoid aggressive measures to artificially sustain their mother's life.  [name] says they do not want to keep her alive with tubes and machines. They want to keep her as comfortable as possible while treating medical issues without doing harm.    DNR remains. Plan to treat the treatable. Time for outcomes.   Advance directives, concepts specific to code status, artificial feeding and hydration, and rehospitalization were considered and discussed.  I introduced the concept of a MOST form and it's use for patients discharging to SNFs.  We discussed limited interventions to include DNR, treating the treatable, using IV abx/IVF when appropriate, and to avoid use of mechanical ventilation/intubation. We agreed to address MOST form completion once discharge is closer.   Family is facing treatment option decisions, advanced directive, and anticipatory care needs.  Mitch inquired about SNF placement.  We discussed that TOC and patient's medical status will guide disposition decisions.   Discussed  with patient/family the importance of continued conversation with family and the medical providers regarding overall plan of care and treatment options, ensuring decisions are within the context of the patient's values and GOCs.    Questions and concerns were addressed. The family was encouraged to call with questions or concerns.  PMT will continue to follow the patient throughout her hospitalization and support patient/family and goals of care discussions.  Primary Decision Maker NEXT OF KIN  Physical Exam Constitutional:      General: She is not in acute distress.    Appearance: Normal appearance. She is not  toxic-appearing.  HENT:     Head: Normocephalic.     Mouth/Throat:     Mouth: Mucous membranes are moist.  Eyes:     Pupils: Pupils are equal, round, and reactive to light.  Cardiovascular:     Rate and Rhythm: Normal rate.     Pulses: Normal pulses.  Pulmonary:     Effort: Pulmonary effort is normal.  Abdominal:     Palpations: Abdomen is soft.  Musculoskeletal:     Comments: Generalized weakness  Skin:    General: Skin is warm and dry.  Neurological:     Mental Status: She is alert. Mental status is at baseline.     Comments: Oriented to self  Psychiatric:        Behavior: Behavior normal.     Palliative Assessment/Data: 50%     Thank you for this consult. Palliative medicine will continue to follow and assist holistically.   Time Total: 75 minutes Greater than 50%  of this time was spent counseling and coordinating care related to the above assessment and plan.  Signed by: Jordan Hawks, DNP, FNP-BC Palliative Medicine    Please contact Palliative Medicine Team phone at [phone] for questions and concerns.  For individual provider: See Shea Evans

## 2022-10-11 NOTE — Progress Notes (Signed)
[place] for heparin infusion Indication: pulmonary embolus  Allergies  Allergen Reactions   Penicillins Swelling and Rash    Other reaction(s): Rash Has patient had a PCN reaction causing immediate rash, facial/tongue/throat swelling, SOB or lightheadedness with hypotension: Yes Has patient had a PCN reaction causing severe rash involving mucus membranes or skin necrosis: Unknown Has patient had a PCN reaction that required hospitalization:Yes Has patient had a PCN reaction occurring within the last 10 years: No If all of the above answers are "NO", then may proceed with Cephalosporin use.    Evista  [Raloxifene]     Other reaction(s): Joint Pains   Ibandronic Acid     Other reaction(s): Muscle Pain   Remicade [Infliximab] Other (See Comments)    Due to bleeding ulcer issue--MD advised her not to take   Risedronate Sodium     Other reaction(s): Joint Pains    Patient Measurements: Height: '5\' 4"'$  (162.6 cm) Weight: 80.4 kg (177 lb 4 oz) IBW/kg (Calculated) : 54.7 Heparin Dosing Weight: 72 kg  Vital Signs: Temp: 98.2 F (36.8 C) (10/25 0400) Temp Source: Axillary (10/25 0400) BP: 118/59 (10/25 0600) Pulse Rate: 86 (10/25 0600)  Labs: Recent Labs    10/08/22 1021 10/08/22 1244 10/08/22 2254 10/09/22 0742 10/09/22 1828 10/10/22 0101 10/11/22 0335  HGB  --   --   --   --   --  10.1* 9.6*  HCT  --   --   --   --   --  30.7* 28.8*  PLT  --   --   --   --   --  86* 85*  APTT 169* 170*  --  100*  --   --   --   LABPROT 17.6*  --   --   --   --   --   --   INR 1.5*  --   --   --   --   --   --   HEPARINUNFRC 1.10*  --    < > 0.30 0.41 0.41 0.28*  CREATININE  --   --   --  0.75  --  0.69 0.58   < > = values in this interval not displayed.     Estimated Creatinine Clearance: 52.8 mL/min (by C-G formula based on SCr of 0.58 mg/dL).   Medical History: Past Medical History:  Diagnosis Date   Arthritis    GERD (gastroesophageal  reflux disease)    Hyperlipidemia    Parkinson disease ([place])    Restless leg syndrome    Medications: Pt initial ordered and given enoxaparin 40 mg 10/21 @ 2108.  Assessment: Pt is a 88 yo female admitted w/ possible pneumonia & acute colitis, now found w/ "tiny acute pulmonary emboli within the lower lobes bilaterally."   Goal of Therapy:  Heparin level 0.3-0.7 units/ml Monitor platelets by anticoagulation protocol: Yes    Plan: heparin level therapeutic following recent rate change - continue heparin infusion rate at 1200 units/hr - Recheck heparin level in 8 hours to confirm - CBC daily while on heparin  Dallie Piles, PharmD Clinical Pharmacist 10/11/2022 8:22 AM

## 2022-10-11 NOTE — Progress Notes (Signed)
[place] for heparin infusion Indication: pulmonary embolus  Allergies  Allergen Reactions   Penicillins Swelling and Rash    Other reaction(s): Rash Has patient had a PCN reaction causing immediate rash, facial/tongue/throat swelling, SOB or lightheadedness with hypotension: Yes Has patient had a PCN reaction causing severe rash involving mucus membranes or skin necrosis: Unknown Has patient had a PCN reaction that required hospitalization:Yes Has patient had a PCN reaction occurring within the last 10 years: No If all of the above answers are "NO", then may proceed with Cephalosporin use.    Evista  [Raloxifene]     Other reaction(s): Joint Pains   Ibandronic Acid     Other reaction(s): Muscle Pain   Remicade [Infliximab] Other (See Comments)    Due to bleeding ulcer issue--MD advised her not to take   Risedronate Sodium     Other reaction(s): Joint Pains    Patient Measurements: Height: '5\' 4"'$  (162.6 cm) Weight: 80.4 kg (177 lb 4 oz) IBW/kg (Calculated) : 54.7 Heparin Dosing Weight: 72 kg  Vital Signs: Temp: 98.6 F (37 C) (10/25 1900) Temp Source: Oral (10/25 1900) BP: 116/69 (10/25 2200) Pulse Rate: 75 (10/25 2200)  Labs: Recent Labs    10/09/22 0742 10/09/22 1828 10/10/22 0101 10/11/22 0335 10/11/22 1416 10/11/22 2214  HGB  --   --  10.1* 9.6*  --   --   HCT  --   --  30.7* 28.8*  --   --   PLT  --   --  86* 85*  --   --   APTT 100*  --   --   --   --   --   HEPARINUNFRC 0.30   < > 0.41 0.28* 0.50 0.54  CREATININE 0.75  --  0.69 0.58  --   --    < > = values in this interval not displayed.     Estimated Creatinine Clearance: 52.8 mL/min (by C-G formula based on SCr of 0.58 mg/dL).   Medical History: Past Medical History:  Diagnosis Date   Arthritis    GERD (gastroesophageal reflux disease)    Hyperlipidemia    Parkinson disease ([place])    Restless leg syndrome    Medications: Pt initial ordered and given  enoxaparin 40 mg 10/21 @ 2108.  Assessment: Pt is a 88 yo female admitted w/ possible pneumonia & acute colitis, now found w/ "tiny acute pulmonary emboli within the lower lobes bilaterally."   Goal of Therapy:  Heparin level 0.3-0.7 units/ml Monitor platelets by anticoagulation protocol: Yes    Plan: heparin level therapeutic x 2 - continue heparin infusion rate at 1200 units/hr - Recheck heparin level with AM labs - CBC daily while on heparin  Pearla Dubonnet, PharmD Clinical Pharmacist 10/11/2022 11:02 PM

## 2022-10-11 NOTE — Progress Notes (Signed)
SLP F/UNote  Patient Details Name: Morgan Dennis MRN: [id number] DOB: 09/05/1934   Cancelled treatment:       Reason Eval/Treat Not Completed:  (chart reviewed; consulted NSG re: pt's status.) Pt is tolerating her current Full liquid diet; No reports of overt s/s of aspiration noted. She is tolerating her Pills in Puree w/ NSG. No oropharyngeal phase swallowing deficits noted. ST services will f/u tomorrow w/ trials to upgrade diet to soft solids then S/O as appropriate.  Recommend continue general aspiration precautions. NSG agreed.       Orinda Kenner, [place], [place] Speech Language Pathologist Rehab Services; [place] [phone] (ascom) Morgan Dennis 10/11/2022, 5:10 PM

## 2022-10-11 NOTE — Progress Notes (Signed)
[place] for heparin infusion Indication: pulmonary embolus  Allergies  Allergen Reactions   Penicillins Swelling and Rash    Other reaction(s): Rash Has patient had a PCN reaction causing immediate rash, facial/tongue/throat swelling, SOB or lightheadedness with hypotension: Yes Has patient had a PCN reaction causing severe rash involving mucus membranes or skin necrosis: Unknown Has patient had a PCN reaction that required hospitalization:Yes Has patient had a PCN reaction occurring within the last 10 years: No If all of the above answers are "NO", then may proceed with Cephalosporin use.    Evista  [Raloxifene]     Other reaction(s): Joint Pains   Ibandronic Acid     Other reaction(s): Muscle Pain   Remicade [Infliximab] Other (See Comments)    Due to bleeding ulcer issue--MD advised her not to take   Risedronate Sodium     Other reaction(s): Joint Pains    Patient Measurements: Height: '5\' 4"'$  (162.6 cm) Weight: 80.4 kg (177 lb 4 oz) IBW/kg (Calculated) : 54.7 Heparin Dosing Weight: 72 kg  Vital Signs: Temp: 98.8 F (37.1 C) (10/25 0000) Temp Source: Oral (10/25 0000) BP: 115/56 (10/25 0300) Pulse Rate: 88 (10/25 0300)  Labs: Recent Labs    10/08/22 1021 10/08/22 1244 10/08/22 2254 10/09/22 0742 10/09/22 1828 10/10/22 0101 10/11/22 0335  HGB  --   --   --   --   --  10.1* 9.6*  HCT  --   --   --   --   --  30.7* 28.8*  PLT  --   --   --   --   --  86* 85*  APTT 169* 170*  --  100*  --   --   --   LABPROT 17.6*  --   --   --   --   --   --   INR 1.5*  --   --   --   --   --   --   HEPARINUNFRC 1.10*  --    < > 0.30 0.41 0.41 0.28*  CREATININE  --   --   --  0.75  --  0.69 0.58   < > = values in this interval not displayed.     Estimated Creatinine Clearance: 52.8 mL/min (by C-G formula based on SCr of 0.58 mg/dL).   Medical History: Past Medical History:  Diagnosis Date   Arthritis    GERD (gastroesophageal  reflux disease)    Hyperlipidemia    Parkinson disease ([place])    Restless leg syndrome    Medications: Pt initial ordered and given enoxaparin 40 mg 10/21 @ 2108.  Assessment: Pt is a 88 yo female admitted w/ possible pneumonia & acute colitis, now found w/ "tiny acute pulmonary emboli within the lower lobes bilaterally."  10/23  1828  HL= 0.41 Therapeutic x 1 10/24 0101 HL= 0.41 Therapeutic x 2   Goal of Therapy:  Heparin level 0.3-0.7 units/ml Monitor platelets by anticoagulation protocol: Yes    Plan:  - 10/25'@0335'$ : HL 0.28, subtherapeutic - Bolus 1100 units x 1 - Increase heparin infusion rate to 1200 units/hr - Recheck heparin level with AM labs - CBC daily while on heparin  Pearla Dubonnet, PharmD Clinical Pharmacist 10/11/2022 4:40 AM

## 2022-10-11 NOTE — Progress Notes (Signed)
PROGRESS NOTE    Morgan Dennis  CXK:[id number] DOB: 12-14-1934 DOA: 10/07/2022  PCP: Jerrol Banana., MD    Brief Narrative: This 88 years old female with PMH significant for hypertension, hyperlipidemia, dementia, Parkinson disease, anxiety, depression, rheumatoid arthritis and recent COVID-19 infection presented in the ED from memory care center with chief complaint of syncope and hypotension.  Patient had witnessed syncopal episode in memory unit and was found to be hypotensive.  She was recently hospitalized and was treated with paxloid  and steroids for COVID-19 infection.  She was discharged on 2 L of supplemental oxygen.  Patient was found to be septic and started empirically on antibiotics due to suspected colitis and possible HCAP versus aspiration pneumonia.  Patient was hypotensive requiring pressor support. TRH pick up 10/11/22.  Assessment & Plan:   Principal Problem:   Severe sepsis ([place]) Active Problems:   Acute colitis   HCAP (healthcare-associated pneumonia)   Myocardial injury   AKI (acute kidney injury) ([place])   Essential hypertension   Parkinson's disease   Rheumatoid arthritis ([place])   Syncope   Dementia without behavioral disturbance ([place])   Depression with anxiety   Severe sepsis with acute organ dysfunction (H[facility])  Acute metabolic encephalopathy > multifactorial CT head no acute abnormality.  Avoid sedating medications. Could be due to dementia, Parkinson's disease. Continue Sinemet, Namenda and Aricept for dementia Mental status improving.  Acute on chronic hypoxic respiratory failure: Acute bilateral pulmonary embolism: Family does not want any aggressive intervention. Continue supplemental oxygen and wean as tolerated. Patient is on anticoagulation but DOC will need to be discussed prior to discharge given risk of falls with her dementia and Parkinson's disease.  Continue heparin GTT for now.  AKI likely prerenal due to ATN. Renal functions  improved.  Thrombocytopenia: Continue to monitor platelet count while on heparin.  Septic shock secondary to Suspected colitis/ HCAP: Patient was hypotensive requiring Levophed support. She was started on stress dose steroids.  Septic shock resolved Patient reports having abdominal pain CT abdomen shows possible colitis Continue broad-spectrum antibiotics aztreonam and metronidazole Follow-up blood and urine cultures.   DVT prophylaxis: Heparin gtt. Code Status: DNR Family Communication: No family at bedside Disposition Plan:   Status is: Inpatient Remains inpatient appropriate because: Admitted for acute metabolic encephalopathy and septic shock requiring Levophed support.  She is also found to have pulmonary embolism requiring anticoagulation.   Consultants:  PCCM  Procedures: None Antimicrobials: Anti-infectives (From admission, onward)    Start     Dose/Rate Route Frequency Ordered Stop   10/10/22 0100  vancomycin (VANCOREADY) IVPB 750 mg/150 mL  Status:  Discontinued       See Hyperspace for full Linked Orders Report.   750 mg 150 mL/hr over 60 Minutes Intravenous Every 24 hours 10/09/22 0009 10/09/22 1017   10/09/22 0100  vancomycin (VANCOREADY) IVPB 1500 mg/300 mL       See Hyperspace for full Linked Orders Report.   1,500 mg 150 mL/hr over 120 Minutes Intravenous  Once 10/09/22 0009 10/09/22 0247   10/08/22 0400  metroNIDAZOLE (FLAGYL) IVPB 500 mg        500 mg 100 mL/hr over 60 Minutes Intravenous Every 12 hours 10/07/22 1823     10/07/22 2200  aztreonam (AZACTAM) 1 g in sodium chloride 0.9 % 100 mL IVPB        1 g 200 mL/hr over 30 Minutes Intravenous Every 8 hours 10/07/22 1859     10/07/22 1902  vancomycin variable  dose per unstable renal function (pharmacist dosing)  Status:  Discontinued         Does not apply See admin instructions 10/07/22 1906 10/09/22 0012   10/07/22 1845  vancomycin (VANCOREADY) IVPB 2000 mg/400 mL        2,000 mg 200 mL/hr over 120  Minutes Intravenous  Once 10/07/22 1841 10/07/22 2358   10/07/22 1615  aztreonam (AZACTAM) 2 g in sodium chloride 0.9 % 100 mL IVPB  Status:  Discontinued        2 g 200 mL/hr over 30 Minutes Intravenous Every 8 hours 10/07/22 1603 10/07/22 1859   10/07/22 1615  vancomycin (VANCOCIN) IVPB 1000 mg/200 mL premix  Status:  Discontinued        1,000 mg 200 mL/hr over 60 Minutes Intravenous  Once 10/07/22 1603 10/07/22 1841   10/07/22 1615  metroNIDAZOLE (FLAGYL) IVPB 500 mg        500 mg 100 mL/hr over 60 Minutes Intravenous  Once 10/07/22 1603 10/07/22 1848        Subjective: Patient was seen and examined at bedside.  Overnight events noted. She is off Levophed support but she is chronically ill looking.  Deconditioned  Objective: Vitals:   10/11/22 0832 10/11/22 0900 10/11/22 1300 10/11/22 1400  BP:  122/62 (!) 103/51 (!) 105/56  Pulse: 77 77 68 79  Resp: (!) '22 11 14 '$ (!) 24  Temp:      TempSrc:      SpO2: 97% 96% 98% 98%  Weight:      Height:        Intake/Output Summary (Last 24 hours) at 10/11/2022 1501 Last data filed at 10/11/2022 1242 Gross per 24 hour  Intake 902.53 ml  Output 901 ml  Net 1.53 ml   Filed Weights   10/07/22 1800 10/08/22 0037  Weight: 84.9 kg 80.4 kg    Examination:  General exam: Appears comfortable, not in any acute distress, chronically ill looking and deconditioned Respiratory system: CTA bilaterally, respiratory effort normal, RR 15 Cardiovascular system: S1 & S2 heard, regular rate and rhythm, no murmur. Gastrointestinal system: Abdomen is soft, non tender, non distended, BS+ Central nervous system: Alert and oriented x2. No focal neurological deficits. Extremities: No edema, no cyanosis, no clubbing Skin: No rashes, lesions or ulcers Psychiatry: Judgement and insight appear normal. Mood & affect appropriate.     Data Reviewed: I have personally reviewed following labs and imaging studies  CBC: Recent Labs  Lab 10/07/22 1501  10/08/22 0115 10/10/22 0101 10/11/22 0335  WBC 14.3* 25.5* 25.5* 16.4*  NEUTROABS 10.6*  --  20.6* 12.9*  HGB 16.6* 14.3 10.1* 9.6*  HCT 49.8* 43.8 30.7* 28.8*  MCV 96.5 98.4 99.7 96.6  PLT 176 155 86* 85*   Basic Metabolic Panel: Recent Labs  Lab 10/07/22 2130 10/07/22 2223 10/07/22 2223 10/08/22 0115 10/09/22 0742 10/09/22 1828 10/10/22 0101 10/10/22 0705 10/10/22 2035 10/11/22 0335  NA  --  134*  --  134* 138  --  139  --   --  141  K  --  3.3*  --  3.3* 2.8* 3.1* 3.3*  3.5 3.4* 3.6 3.5  CL  --  101  --  99 107  --  108  --   --  111  CO2  --  21*  --  24 24  --  27  --   --  25  GLUCOSE  --  163*  --  182* 154*  --  87  --   --  115*  BUN  --  35*  --  35* 23  --  23  --   --  23  CREATININE  --  1.45*  --  1.47* 0.75  --  0.69  --   --  0.58  CALCIUM  --  8.4*  --  7.9* 7.5*  --  7.3*  --   --  7.4*  MG 2.2  --   --   --  2.0  --  2.1  --   --  2.2  PHOS  --   --    < > 4.3  --  1.5* 2.2*  3.0 2.0* 1.6* 3.7   < > = values in this interval not displayed.   GFR: Estimated Creatinine Clearance: 52.8 mL/min (by C-G formula based on SCr of 0.58 mg/dL). Liver Function Tests: Recent Labs  Lab 10/07/22 1501 10/07/22 2223  AST 56* 43*  ALT 8 8  ALKPHOS 111 110  BILITOT 1.6* 1.7*  PROT 8.1 4.9*  ALBUMIN 4.9 2.7*   Recent Labs  Lab 10/07/22 1501  LIPASE 48   No results for input(s): "AMMONIA" in the last 168 hours. Coagulation Profile: Recent Labs  Lab 10/08/22 1021  INR 1.5*   Cardiac Enzymes: No results for input(s): "CKTOTAL", "CKMB", "CKMBINDEX", "TROPONINI" in the last 168 hours. BNP (last 3 results) No results for input(s): "PROBNP" in the last 8760 hours. HbA1C: No results for input(s): "HGBA1C" in the last 72 hours. CBG: Recent Labs  Lab 10/10/22 1947 10/10/22 2347 10/11/22 0340 10/11/22 0729 10/11/22 1129  GLUCAP 87 103* 113* 121* 161*   Lipid Profile: No results for input(s): "CHOL", "HDL", "LDLCALC", "TRIG", "CHOLHDL", "LDLDIRECT"  in the last 72 hours. Thyroid Function Tests: No results for input(s): "TSH", "T4TOTAL", "FREET4", "T3FREE", "THYROIDAB" in the last 72 hours. Anemia Panel: No results for input(s): "VITAMINB12", "FOLATE", "FERRITIN", "TIBC", "IRON", "RETICCTPCT" in the last 72 hours. Sepsis Labs: Recent Labs  Lab 10/07/22 1514 10/07/22 1700 10/07/22 2037 10/08/22 0115 10/08/22 0302 10/08/22 0558  PROCALCITON 0.12  --   --   --   --   --   LATICACIDVEN  --    < > 3.3* 3.5* 2.3* 2.4*   < > = values in this interval not displayed.    Recent Results (from the past 240 hour(s))  Resp Panel by RT-PCR (Flu A&B, Covid) Anterior Nasal Swab     Status: None   Collection Time: 10/07/22  2:08 PM   Specimen: Anterior Nasal Swab  Result Value Ref Range Status   SARS Coronavirus 2 by RT PCR NEGATIVE NEGATIVE Final    Comment: (NOTE) SARS-CoV-2 target nucleic acids are NOT DETECTED.  The SARS-CoV-2 RNA is generally detectable in upper respiratory specimens during the acute phase of infection. The lowest concentration of SARS-CoV-2 viral copies this assay can detect is 138 copies/mL. A negative result does not preclude SARS-Cov-2 infection and should not be used as the sole basis for treatment or other patient management decisions. A negative result may occur with  improper specimen collection/handling, submission of specimen other than nasopharyngeal swab, presence of viral mutation(s) within the areas targeted by this assay, and inadequate number of viral copies(<138 copies/mL). A negative result must be combined with clinical observations, patient history, and epidemiological information. The expected result is Negative.  Fact Sheet for Patients:  EntrepreneurPulse.com.au  Fact Sheet for Healthcare Providers:  IncredibleEmployment.be  This test is no t yet approved or cleared by the [place] and  has been authorized for detection and/or diagnosis of  SARS-CoV-2 by FDA under an Emergency Use Authorization (EUA). This EUA will remain  in effect (meaning this test can be used) for the duration of the COVID-19 declaration under Section 564(b)(1) of the Act, 21 U.S.C.section 360bbb-3(b)(1), unless the authorization is terminated  or revoked sooner.       Influenza A by PCR NEGATIVE NEGATIVE Final   Influenza B by PCR NEGATIVE NEGATIVE Final    Comment: (NOTE) The Xpert Xpress SARS-CoV-2/FLU/RSV plus assay is intended as an aid in the diagnosis of influenza from Nasopharyngeal swab specimens and should not be used as a sole basis for treatment. Nasal washings and aspirates are unacceptable for Xpert Xpress SARS-CoV-2/FLU/RSV testing.  Fact Sheet for Patients: EntrepreneurPulse.com.au  Fact Sheet for Healthcare Providers: IncredibleEmployment.be  This test is not yet approved or cleared by the [place] FDA and has been authorized for detection and/or diagnosis of SARS-CoV-2 by FDA under an Emergency Use Authorization (EUA). This EUA will remain in effect (meaning this test can be used) for the duration of the COVID-19 declaration under Section 564(b)(1) of the Act, 21 U.S.C. section 360bbb-3(b)(1), unless the authorization is terminated or revoked.  Performed at [facility], [address] [place], [place] [address]   Culture, blood (routine x 2)     Status: None (Preliminary result)   Collection Time: 10/07/22  4:00 PM   Specimen: BLOOD  Result Value Ref Range Status   Specimen Description BLOOD BLOOD LEFT ARM  Final   Special Requests   Final    BOTTLES DRAWN AEROBIC AND ANAEROBIC Blood Culture adequate volume   Culture   Final    NO GROWTH 4 DAYS Performed at [facility], [address] [place], [place] [address]    Report Status PENDING  Incomplete  Urine Culture     Status: Abnormal   Collection Time: 10/07/22  4:45 PM   Specimen: Urine, Random  Result  Value Ref Range Status   Specimen Description   Final    URINE, RANDOM Performed at [facility], [address] [place], [place] [address]    Special Requests   Final    NONE Performed at [facility], [address] [place],  [address]    Culture 30,000 COLONIES/mL ESCHERICHIA COLI (A)  Final   Report Status 10/10/2022 FINAL  Final   Organism ID, Bacteria ESCHERICHIA COLI (A)  Final      Susceptibility   Escherichia coli - MIC*    AMPICILLIN 4 SENSITIVE Sensitive     CEFAZOLIN <=4 SENSITIVE Sensitive     CEFEPIME <=0.12 SENSITIVE Sensitive     CEFTRIAXONE <=0.25 SENSITIVE Sensitive     CIPROFLOXACIN <=0.25 SENSITIVE Sensitive     GENTAMICIN <=1 SENSITIVE Sensitive     IMIPENEM <=0.25 SENSITIVE Sensitive     NITROFURANTOIN <=16 SENSITIVE Sensitive     TRIMETH/SULFA <=20 SENSITIVE Sensitive     AMPICILLIN/SULBACTAM <=2 SENSITIVE Sensitive     PIP/TAZO <=4 SENSITIVE Sensitive     * 30,000 COLONIES/mL ESCHERICHIA COLI  Culture, blood (routine x 2)     Status: None (Preliminary result)   Collection Time: 10/07/22  8:37 PM   Specimen: BLOOD RIGHT HAND  Result Value Ref Range Status   Specimen Description BLOOD RIGHT HAND  Final   Special Requests   Final    BOTTLES DRAWN AEROBIC ONLY Blood Culture adequate volume   Culture   Final    NO GROWTH  4 DAYS Performed at [facility], [address] [place], [place] [address]    Report Status PENDING  Incomplete  MRSA Next Gen by PCR, Nasal     Status: None   Collection Time: 10/08/22 12:35 AM   Specimen: Nasal Mucosa; Nasal Swab  Result Value Ref Range Status   MRSA by PCR Next Gen NOT DETECTED NOT DETECTED Final    Comment: (NOTE) The GeneXpert MRSA Assay (FDA approved for NASAL specimens only), is one component of a comprehensive MRSA colonization surveillance program. It is not intended to diagnose MRSA infection nor to guide or monitor treatment for MRSA infections. Test performance is  not FDA approved in patients less than 49 years old. Performed at [facility] [address] [place], [place] [address]     Radiology Studies: No results found.  Scheduled Meds:  aspirin EC  81 mg Oral Daily   atorvastatin  20 mg Oral QHS   carbidopa-levodopa  2 tablet Oral TID   carbidopa-levodopa  2 tablet Oral QHS   And   entacapone  200 mg Oral QHS   Chlorhexidine Gluconate Cloth  6 each Topical Daily   donepezil  10 mg Oral QHS   DULoxetine  60 mg Oral Daily   famotidine  20 mg Oral QHS   folic acid  1 mg Oral Daily   hydrocortisone sod succinate (SOLU-CORTEF) inj  50 mg Intravenous Q12H   melatonin  5 mg Oral QHS   memantine  10 mg Oral BID   metoprolol tartrate  25 mg Oral BID   multivitamin with minerals  1 tablet Oral Daily   nystatin  5 mL Oral QID   pregabalin  150 mg Oral QHS   vitamin E  400 Units Oral Daily   Continuous Infusions:  sodium chloride Stopped (10/07/22 2256)   aztreonam Stopped (10/11/22 0814)   heparin 1,200 Units/hr (10/11/22 0900)   metronidazole 100 mL/hr at 10/11/22 0900     LOS: 4 days    Time spent: 50 mins    Marshall Kampf, MD [facility]   If 7PM-7AM, please contact night-coverage

## 2022-10-11 NOTE — Progress Notes (Signed)
[place] for Electrolyte Monitoring and Replacement   Recent Labs: Potassium (mmol/L)  Date Value  10/11/2022 3.5   Magnesium (mg/dL)  Date Value  10/11/2022 2.2   Calcium (mg/dL)  Date Value  10/11/2022 7.4 (L)   Albumin (g/dL)  Date Value  10/07/2022 2.7 (L)  06/07/2021 4.3   Phosphorus (mg/dL)  Date Value  10/11/2022 3.7   Sodium (mmol/L)  Date Value  10/11/2022 141  06/07/2021 139     Assessment: 88 y.o. female w/ PMH of HTN, HLD, depression with anxiety, Parkinson's disease, dementia, rheumatoid arthritis, recent admission due to COVID-19 infection on 2 L oxygen, who presents with syncope, cough, abdominal pain. Pharmacy is asked to follow and replace electrolytes while in CCU  Goal of Therapy:  Electrolytes WNL  Plan:  No electrolyte replacement warranted for today Recheck electrolytes in am  Dallie Piles ,PharmD Clinical Pharmacist 10/11/2022 7:13 AM

## 2022-10-12 ENCOUNTER — Other Ambulatory Visit: Payer: Self-pay

## 2022-10-12 ENCOUNTER — Encounter: Payer: Self-pay | Admitting: Internal Medicine

## 2022-10-12 DIAGNOSIS — R55 Syncope and collapse: Secondary | ICD-10-CM | POA: Diagnosis not present

## 2022-10-12 DIAGNOSIS — F0390 Unspecified dementia without behavioral disturbance: Secondary | ICD-10-CM | POA: Diagnosis not present

## 2022-10-12 DIAGNOSIS — R6520 Severe sepsis without septic shock: Secondary | ICD-10-CM | POA: Diagnosis not present

## 2022-10-12 DIAGNOSIS — A419 Sepsis, unspecified organism: Secondary | ICD-10-CM | POA: Diagnosis not present

## 2022-10-12 DIAGNOSIS — G20A2 Parkinson's disease without dyskinesia, with fluctuations: Secondary | ICD-10-CM | POA: Diagnosis not present

## 2022-10-12 LAB — CBC WITH DIFFERENTIAL/PLATELET
Abs Immature Granulocytes: 0.68 10*3/uL — ABNORMAL HIGH (ref 0.00–0.07)
Basophils Absolute: 0.1 10*3/uL (ref 0.0–0.1)
Basophils Relative: 0 %
Eosinophils Absolute: 0 10*3/uL (ref 0.0–0.5)
Eosinophils Relative: 0 %
HCT: 28.9 % — ABNORMAL LOW (ref 36.0–46.0)
Hemoglobin: 9.4 g/dL — ABNORMAL LOW (ref 12.0–15.0)
Immature Granulocytes: 3 %
Lymphocytes Relative: 10 %
Lymphs Abs: 2 10*3/uL (ref 0.7–4.0)
MCH: 31.9 pg (ref 26.0–34.0)
MCHC: 32.5 g/dL (ref 30.0–36.0)
MCV: 98 fL (ref 80.0–100.0)
Monocytes Absolute: 1.6 10*3/uL — ABNORMAL HIGH (ref 0.1–1.0)
Monocytes Relative: 8 %
Neutro Abs: 16.2 10*3/uL — ABNORMAL HIGH (ref 1.7–7.7)
Neutrophils Relative %: 79 %
Platelets: 119 10*3/uL — ABNORMAL LOW (ref 150–400)
RBC: 2.95 MIL/uL — ABNORMAL LOW (ref 3.87–5.11)
RDW: 14.1 % (ref 11.5–15.5)
WBC: 20.6 10*3/uL — ABNORMAL HIGH (ref 4.0–10.5)
nRBC: 0 % (ref 0.0–0.2)

## 2022-10-12 LAB — BASIC METABOLIC PANEL
Anion gap: 7 (ref 5–15)
BUN: 16 mg/dL (ref 8–23)
CO2: 24 mmol/L (ref 22–32)
Calcium: 7.2 mg/dL — ABNORMAL LOW (ref 8.9–10.3)
Chloride: 111 mmol/L (ref 98–111)
Creatinine, Ser: 0.54 mg/dL (ref 0.44–1.00)
GFR, Estimated: >60 mL/min (ref >60)
Glucose, Bld: 119 mg/dL — ABNORMAL HIGH (ref 70–99)
Potassium: 3.3 mmol/L — ABNORMAL LOW (ref 3.5–5.1)
Sodium: 142 mmol/L (ref 135–145)

## 2022-10-12 LAB — CULTURE, BLOOD (ROUTINE X 2)
Culture: NO GROWTH
Culture: NO GROWTH
Special Requests: ADEQUATE
Special Requests: ADEQUATE

## 2022-10-12 LAB — GLUCOSE, CAPILLARY
Glucose-Capillary: 121 mg/dL — ABNORMAL HIGH (ref 70–99)
Glucose-Capillary: 101 mg/dL — ABNORMAL HIGH (ref 70–99)
Glucose-Capillary: 130 mg/dL — ABNORMAL HIGH (ref 70–99)
Glucose-Capillary: 92 mg/dL (ref 70–99)
Glucose-Capillary: 114 mg/dL — ABNORMAL HIGH (ref 70–99)

## 2022-10-12 LAB — PHOSPHORUS: Phosphorus: 2.2 mg/dL — ABNORMAL LOW (ref 2.5–4.6)

## 2022-10-12 LAB — MAGNESIUM: Magnesium: 2.2 mg/dL (ref 1.7–2.4)

## 2022-10-12 LAB — HEPARIN LEVEL (UNFRACTIONATED): Heparin Unfractionated: 0.56 IU/mL (ref 0.30–0.70)

## 2022-10-12 MED ORDER — POTASSIUM CHLORIDE 20 MEQ PO PACK
40.0000 meq | PACK | Freq: Once | ORAL | Status: AC
  Administered 2022-10-12: 40 meq via ORAL
  Filled 2022-10-12: qty 2

## 2022-10-12 MED ORDER — POTASSIUM CHLORIDE 20 MEQ PO PACK: 40.0000 meq | PACK | Freq: Once | ORAL | Status: DC

## 2022-10-12 NOTE — Progress Notes (Signed)
                                                     Palliative Care Progress Note, Assessment & Plan   Patient Name: Morgan Dennis       Date: 10/12/2022 DOB: Sep 03, 1934  Age: 88 y.o. MRN#: [id number] Attending Physician: Shawna Clamp, MD Primary Care Physician: Jerrol Banana., MD Admit Date: 10/07/2022  Reason for Consultation/Follow-up: Establishing goals of care  Subjective: Patient is lying in bed in no apparent distress.  She acknowledges my presence and is able to make her wishes known.  She is asking for sips of water. No family or friends present at bedside.  Summary of counseling/coordination of care: After reviewing the patient's chart and assessing the patient at bedside, I spoke with patient's son [name] over the phone.  He visited with patient earlier today and hospice had been recommended by attending.  We discussed hospice philosophy, hospice at home, and hospice inpatient unit placement.  [name] is open to hospice care if that is the needed layer of support at discharge. He continues to want to see ho whis mother progresses/deteriorates and to continue discussion of balancing medical treatments with ensuring comfort for his.  Continue to treat the treatable. [name] is open and willing to speak with Hospice in conjunction with TOC/Sw/case manager to decide best placement for his mother after discharge.  MOST form again discussed and copy left at patient's bedside for pt/family review.   Goals remains clear. DNR. Treat the treatable.   PMT will remain available to patient and family throughout her hospitalization.  PMT will shadow the chart and monitor the patient peripherally. TOC can offer choice of hospice agencies at discharge when appropriate.     Patient's son has PMT contact information and will reach out for any future  palliative needs.   Physical Exam Vitals reviewed.  Constitutional:      General: She is not in acute distress.    Appearance: Normal appearance. She is normal weight.  HENT:     Head: Normocephalic.  Eyes:     Pupils: Pupils are equal, round, and reactive to light.  Cardiovascular:     Pulses: Normal pulses.  Pulmonary:     Effort: Pulmonary effort is normal.  Abdominal:     Palpations: Abdomen is soft.  Musculoskeletal:     Comments: Generalized weakness  Skin:    General: Skin is warm and dry.     Coloration: Skin is pale.  Neurological:     Mental Status: She is alert. Mental status is at baseline.  Psychiatric:        Mood and Affect: Mood normal.             Palliative Assessment/Data: 50%    Total Time 35 minutes  Greater than 50%  of this time was spent counseling and coordinating care related to the above assessment and plan.  Thank you for allowing the Palliative Medicine Team to assist in the care of this patient.  [place] Ilsa Iha, FNP-BC Palliative Medicine Team Team Phone # [phone]

## 2022-10-12 NOTE — TOC Initial Note (Addendum)
Transition of Care [facility]) - Initial/Assessment Note    Patient Details  Name: Morgan Dennis MRN: [id number] Date of Birth: 02/03/1934  Transition of Care [facility] CM/SW Contact:    Shelbie Hutching, RN Phone Number: 10/12/2022, 9:22 AM  Clinical Narrative:                 Patient admitted to the hospital for sepsis.  RNCM met with patient and her son AJ at the bedside, introduced self and explained role in DC planning.  Patient is awake but confused.  She lives at [place] and is able to walk with a walker and gets back and forth to the bathroom by herself.  Her son, [name] believes she will likely need SNF at discharge, he prefers [facility] or [facility] and if neither of those can offer he would like [facility] [place] in [place].    TOC will cont to to follow.    Expected Discharge Plan: Skilled Nursing Facility Barriers to Discharge: Continued Medical Work up   Patient Goals and CMS Choice Patient states their goals for this hospitalization and ongoing recovery are:: patient's son believes she may need rehab CMS Medicare.gov Compare Post Acute Care list provided to:: Patient Represenative (must comment) Choice offered to / list presented to : Adult Children  Expected Discharge Plan and Services Expected Discharge Plan: [place]   Discharge Planning Services: CM Consult Post Acute Care Choice: [place] Living arrangements for the past 2 months: [place]                 DME Arranged: N/A DME Agency: NA                  Prior Living Arrangements/Services Living arrangements for the past 2 months: [place] Lives with:: Facility Resident Patient language and need for interpreter reviewed:: Yes Do you feel safe going back to the place where you live?: Yes      Need for Family Participation in Patient Care: Yes (Comment) Care giver support system in place?: Yes (comment) Current home services: DME (walker) Criminal  Activity/Legal Involvement Pertinent to Current Situation/Hospitalization: No - Comment as needed  Activities of Daily Living      Permission Sought/Granted Permission sought to share information with : Case Manager, Family Supports, Chartered certified accountant granted to share information with : Yes, Verbal Permission Granted  Share Information with NAME: Mitch or LADORIS LYTHGOE  Permission granted to share info w AGENCY: SNF's  Permission granted to share info w Relationship: sons     Emotional Assessment Appearance:: Appears stated age Attitude/Demeanor/Rapport: Engaged Affect (typically observed): Accepting Orientation: : Oriented to Self, Oriented to Place Alcohol / Substance Use: Not Applicable Psych Involvement: No (comment)  Admission diagnosis:  HCAP (healthcare-associated pneumonia) [J18.9] Severe sepsis with acute organ dysfunction ([place]) [A41.9, R65.20] Syncope, unspecified syncope type [R55] Patient Active Problem List   Diagnosis Date Noted   HCAP (healthcare-associated pneumonia) 10/07/2022   Severe sepsis ([place]) 10/07/2022   Acute colitis 10/07/2022   Depression with anxiety 10/07/2022   Myocardial injury 10/07/2022   AKI (acute kidney injury) ([place]) 10/07/2022   Syncope 10/07/2022   Severe sepsis with acute organ dysfunction ([place]) 10/07/2022   Acute hypoxemic respiratory failure due to COVID-19 [facility]) 08/17/2022   Hypokalemia 08/17/2022   Essential hypertension 08/17/2022   Dyslipidemia 08/17/2022   Hearing deficit 05/04/2021   Acute respiratory failure due to COVID-19 ([place]) 12/10/2019   Acute on chronic respiratory  failure with hypoxia ([place]) 10/19/2019   Thrombocytopenia ([place]) 10/19/2019   Dementia without behavioral disturbance ([place]) 10/19/2019   Aortic atherosclerosis ([place]) 10/19/2019   Right upper lobe pneumonia 10/17/2019   Sepsis ([place]) 06/16/2019   Parkinson's disease 04/23/2017   Skin cancer 02/12/2017   Chronic bronchitis ([place]) 01/02/2017    Frequency of urination and polyuria 10/12/2015   Upper GI bleed 08/18/2015   Adaptation reaction 04/30/2015   Malaise and fatigue 04/30/2015   Affective disorder, major 04/30/2015   Bad memory 04/30/2015   Mild cognitive disorder 04/30/2015   Fungal infection of toenail 04/30/2015   Arthritis, degenerative 04/30/2015   OP (osteoporosis) 04/30/2015   Arthritis or polyarthritis, rheumatoid ([place]) 04/30/2015   Personal history of other malignant neoplasm of skin 11/16/2014   Avitaminosis D 05/12/2014   Trigger finger 05/12/2014   Rheumatoid arthritis ([place]) 03/17/2014   Osteoporosis, post-menopausal 03/17/2014   PCP:  Jerrol Banana., MD Pharmacy:   [facility] [address], [place] [address] Phone: [phone] Fax: [place], [place] [address] [address] Phone: [phone] Fax: [place], [place] [address] Phone: [phone] Fax: [phone]     Social Determinants of Health (SDOH) Interventions    Readmission Risk Interventions     No data to display

## 2022-10-12 NOTE — Progress Notes (Signed)
Echo for heparin infusion Indication: pulmonary embolus  Allergies  Allergen Reactions   Penicillins Swelling and Rash    Other reaction(s): Rash Has patient had a PCN reaction causing immediate rash, facial/tongue/throat swelling, SOB or lightheadedness with hypotension: Yes Has patient had a PCN reaction causing severe rash involving mucus membranes or skin necrosis: Unknown Has patient had a PCN reaction that required hospitalization:Yes Has patient had a PCN reaction occurring within the last 10 years: No If all of the above answers are "NO", then may proceed with Cephalosporin use.    Evista  [Raloxifene]     Other reaction(s): Joint Pains   Ibandronic Acid     Other reaction(s): Muscle Pain   Remicade [Infliximab] Other (See Comments)    Due to bleeding ulcer issue--MD advised her not to take   Risedronate Sodium     Other reaction(s): Joint Pains    Patient Measurements: Height: '5\' 4"'$  (162.6 cm) Weight: 80.4 kg (177 lb 4 oz) IBW/kg (Calculated) : 54.7 Heparin Dosing Weight: 72 kg  Vital Signs: Temp: 98.2 F (36.8 C) (10/26 0400) Temp Source: Oral (10/26 0400) BP: 105/56 (10/26 0600) Pulse Rate: 69 (10/26 0600)  Labs: Recent Labs    10/09/22 0742 10/09/22 1828 10/10/22 0101 10/11/22 0335 10/11/22 1416 10/11/22 2214 10/12/22 0446  HGB  --    < > 10.1* 9.6*  --   --  9.4*  HCT  --   --  30.7* 28.8*  --   --  28.9*  PLT  --   --  86* 85*  --   --  119*  APTT 100*  --   --   --   --   --   --   HEPARINUNFRC 0.30   < > 0.41 0.28* 0.50 0.54 0.56  CREATININE 0.75  --  0.69 0.58  --   --  0.54   < > = values in this interval not displayed.     Estimated Creatinine Clearance: 52.8 mL/min (by C-G formula based on SCr of 0.54 mg/dL).   Medical History: Past Medical History:  Diagnosis Date   Arthritis    GERD (gastroesophageal reflux disease)    Hyperlipidemia    Parkinson disease ([place])    Restless leg syndrome     Medications: Pt initial ordered and given enoxaparin 40 mg 10/21 @ 2108.  Assessment: Pt is a 88 yo female admitted w/ possible pneumonia & acute colitis, now found w/ "tiny acute pulmonary emboli within the lower lobes bilaterally."   Goal of Therapy:  Heparin level 0.3-0.7 units/ml Monitor platelets by anticoagulation protocol: Yes    Plan:  - 10/26'@0446'$ : HL 0.56 - heparin level therapeutic x 3 - continue heparin infusion rate at 1200 units/hr - Recheck heparin level with AM labs - CBC daily while on heparin  Pearla Dubonnet, PharmD Clinical Pharmacist 10/12/2022 6:32 AM

## 2022-10-12 NOTE — Progress Notes (Signed)
Morgan Dennis for Electrolyte Monitoring and Replacement   Recent Labs: Potassium (mmol/L)  Date Value  10/12/2022 3.3 (L)   Magnesium (mg/dL)  Date Value  10/12/2022 2.2   Calcium (mg/dL)  Date Value  10/12/2022 7.2 (L)   Albumin (g/dL)  Date Value  10/07/2022 2.7 (L)  06/07/2021 4.3   Phosphorus (mg/dL)  Date Value  10/12/2022 2.2 (L)   Sodium (mmol/L)  Date Value  10/12/2022 142  06/07/2021 139     Assessment: 88 y.o. female w/ PMH of HTN, HLD, depression with anxiety, Parkinson's disease, dementia, rheumatoid arthritis, recent admission due to COVID-19 infection on 2 L oxygen, who presents with syncope, cough, abdominal pain. Pharmacy is asked to follow and replace electrolytes while in CCU  Goal of Therapy:  Electrolytes WNL  Plan:  40 meq oral x 1 Recheck electrolytes in am  Dallie Piles ,PharmD Clinical Pharmacist 10/12/2022 7:38 AM

## 2022-10-12 NOTE — Plan of Care (Signed)
Patient began spitting up blood about 0800.  Tray removed and informed Dr.  Sharlyne Pacas to stop Hep drip  - and stopped.

## 2022-10-12 NOTE — Plan of Care (Signed)

## 2022-10-12 NOTE — Progress Notes (Signed)
PROGRESS NOTE    Morgan Dennis  ZOX:[id number] DOB: March 20, 1934 DOA: 10/07/2022  PCP: Jerrol Banana., MD    Brief Narrative: This 88 years old female with PMH significant for hypertension, hyperlipidemia, dementia, Parkinson disease, anxiety, depression, rheumatoid arthritis and recent COVID-19 infection presented in the ED from memory care center with chief complaint of syncope and hypotension.  Patient had witnessed syncopal episode in memory unit and was found to be hypotensive.  She was recently hospitalized and was treated with paxloid  and steroids for COVID-19 infection.  She was discharged on 2 L of supplemental oxygen.  Patient was found to be septic and started empirically on antibiotics due to suspected colitis and possible HCAP versus aspiration pneumonia.  Patient was hypotensive requiring pressor support. TRH pick up 10/11/22.  Assessment & Plan:   Principal Problem:   Severe sepsis ([place]) Active Problems:   Acute colitis   HCAP (healthcare-associated pneumonia)   Myocardial injury   AKI (acute kidney injury) ([place])   Essential hypertension   Parkinson's disease   Rheumatoid arthritis ([place])   Syncope   Dementia without behavioral disturbance ([place])   Depression with anxiety   Severe sepsis with acute organ dysfunction (H[facility])  Acute metabolic encephalopathy > multifactorial CT head no acute abnormality.  Avoid sedating medications. Could be due to dementia, Parkinson's disease. Continue Sinemet, Namenda and Aricept for dementia Mental status improving.  Acute on chronic hypoxic respiratory failure: Acute bilateral pulmonary embolism: Family does not want any aggressive intervention. Continue supplemental oxygen and wean as tolerated.  She remains on 6 L of supplemental oxygen. Patient is on anticoagulation but DOAC will need to be discussed prior to discharge given risk of falls with her dementia and Parkinson's disease.   Initiated on heparin infusion,  discontinued today since patient coughed up blood.  AKI likely prerenal due to ATN. Renal functions improved.  Thrombocytopenia: Continue to monitor platelet count while on heparin.  Septic shock secondary to Suspected colitis/ HCAP: Patient was hypotensive requiring Levophed support. She was started on stress dose steroids.  Septic shock resolved Patient reports having abdominal pain. CT abdomen shows possible colitis Continue broad-spectrum antibiotics aztreonam and metronidazole Blood and urine cultures no growth so far. Antibiotic discontinued after 6 days.  Goals of care discussion Discussed with son at bedside.  Explained in detail about the poor prognosis. Son states that he does not want her to be aggressively managed. If there is decline from current situation they are agreeable to transition to comfort care.   DVT prophylaxis: Heparin gtt. Code Status: DNR Family Communication: No family at bedside Disposition Plan:   Status is: Inpatient Remains inpatient appropriate because: Admitted for acute metabolic encephalopathy and septic shock requiring Levophed support.  She is also found to have pulmonary embolism requiring anticoagulation.  Palliative care consulted to discuss goals of care.   Consultants:  PCCM  Procedures: None Antimicrobials: Anti-infectives (From admission, onward)    Start     Dose/Rate Route Frequency Ordered Stop   10/10/22 0100  vancomycin (VANCOREADY) IVPB 750 mg/150 mL  Status:  Discontinued       See Hyperspace for full Linked Orders Report.   750 mg 150 mL/hr over 60 Minutes Intravenous Every 24 hours 10/09/22 0009 10/09/22 1017   10/09/22 0100  vancomycin (VANCOREADY) IVPB 1500 mg/300 mL       See Hyperspace for full Linked Orders Report.   1,500 mg 150 mL/hr over 120 Minutes Intravenous  Once 10/09/22 0009 10/09/22 0247  10/08/22 0400  metroNIDAZOLE (FLAGYL) IVPB 500 mg  Status:  Discontinued        500 mg 100 mL/hr over 60 Minutes  Intravenous Every 12 hours 10/07/22 1823 10/12/22 1228   10/07/22 2200  aztreonam (AZACTAM) 1 g in sodium chloride 0.9 % 100 mL IVPB  Status:  Discontinued        1 g 200 mL/hr over 30 Minutes Intravenous Every 8 hours 10/07/22 1859 10/12/22 1228   10/07/22 1902  vancomycin variable dose per unstable renal function (pharmacist dosing)  Status:  Discontinued         Does not apply See admin instructions 10/07/22 1906 10/09/22 0012   10/07/22 1845  vancomycin (VANCOREADY) IVPB 2000 mg/400 mL        2,000 mg 200 mL/hr over 120 Minutes Intravenous  Once 10/07/22 1841 10/07/22 2358   10/07/22 1615  aztreonam (AZACTAM) 2 g in sodium chloride 0.9 % 100 mL IVPB  Status:  Discontinued        2 g 200 mL/hr over 30 Minutes Intravenous Every 8 hours 10/07/22 1603 10/07/22 1859   10/07/22 1615  vancomycin (VANCOCIN) IVPB 1000 mg/200 mL premix  Status:  Discontinued        1,000 mg 200 mL/hr over 60 Minutes Intravenous  Once 10/07/22 1603 10/07/22 1841   10/07/22 1615  metroNIDAZOLE (FLAGYL) IVPB 500 mg        500 mg 100 mL/hr over 60 Minutes Intravenous  Once 10/07/22 1603 10/07/22 1848        Subjective: Patient was seen and examined at bedside.Overnight events noted. She is off Levophed support, She is chronically ill looking. Very deconditioned She coughed up blood in the morning while being on heparin.  Heparin is discontinued.   Objective: Vitals:   10/12/22 0400 10/12/22 0500 10/12/22 0600 10/12/22 1300  BP: (!) 112/54 110/62 (!) 105/56 110/62  Pulse: 64 68 69 74  Resp: '18 13 16 18  '$ Temp: 98.2 F (36.8 C)   98.6 F (37 C)  TempSrc: Oral   Oral  SpO2: 95% 96% 100% 93%  Weight:      Height:        Intake/Output Summary (Last 24 hours) at 10/12/2022 1400 Last data filed at 10/12/2022 1330 Gross per 24 hour  Intake 974.89 ml  Output 450 ml  Net 524.89 ml   Filed Weights   10/07/22 1800 10/08/22 0037  Weight: 84.9 kg 80.4 kg    Examination:  General exam: Appears  comfortable, NAD, chronically ill looking, deconditioned Respiratory system: CTA bilaterally, respiratory effort normal, RR 15 Cardiovascular system: S1 & S2 heard, regular rate and rhythm, no murmur. Gastrointestinal system: Abdomen is soft, non tender, non distended, BS+ Central nervous system: Alert and oriented x 2. No focal neurological deficits. Extremities: No edema, no cyanosis, no clubbing Skin: No rashes, lesions or ulcers Psychiatry: Mood & affect appropriate.     Data Reviewed: I have personally reviewed following labs and imaging studies  CBC: Recent Labs  Lab 10/07/22 1501 10/08/22 0115 10/10/22 0101 10/11/22 0335 10/12/22 0446  WBC 14.3* 25.5* 25.5* 16.4* 20.6*  NEUTROABS 10.6*  --  20.6* 12.9* 16.2*  HGB 16.6* 14.3 10.1* 9.6* 9.4*  HCT 49.8* 43.8 30.7* 28.8* 28.9*  MCV 96.5 98.4 99.7 96.6 98.0  PLT 176 155 86* 85* 097*   Basic Metabolic Panel: Recent Labs  Lab 10/07/22 2130 10/07/22 2223 10/08/22 0115 10/09/22 3532 10/09/22 1828 10/10/22 0101 10/10/22 9924 10/10/22 2035 10/11/22 0335 10/12/22 2683  NA  --    < > 134* 138  --  139  --   --  141 142  K  --    < > 3.3* 2.8*   < > 3.3*  3.5 3.4* 3.6 3.5 3.3*  CL  --    < > 99 107  --  108  --   --  111 111  CO2  --    < > 24 24  --  27  --   --  25 24  GLUCOSE  --    < > 182* 154*  --  87  --   --  115* 119*  BUN  --    < > 35* 23  --  23  --   --  23 16  CREATININE  --    < > 1.47* 0.75  --  0.69  --   --  0.58 0.54  CALCIUM  --    < > 7.9* 7.5*  --  7.3*  --   --  7.4* 7.2*  MG 2.2  --   --  2.0  --  2.1  --   --  2.2 2.2  PHOS  --   --  4.3  --    < > 2.2*  3.0 2.0* 1.6* 3.7 2.2*   < > = values in this interval not displayed.   GFR: Estimated Creatinine Clearance: 52.8 mL/min (by C-G formula based on SCr of 0.54 mg/dL). Liver Function Tests: Recent Labs  Lab 10/07/22 1501 10/07/22 2223  AST 56* 43*  ALT 8 8  ALKPHOS 111 110  BILITOT 1.6* 1.7*  PROT 8.1 4.9*  ALBUMIN 4.9 2.7*   Recent  Labs  Lab 10/07/22 1501  LIPASE 48   No results for input(s): "AMMONIA" in the last 168 hours. Coagulation Profile: Recent Labs  Lab 10/08/22 1021  INR 1.5*   Cardiac Enzymes: No results for input(s): "CKTOTAL", "CKMB", "CKMBINDEX", "TROPONINI" in the last 168 hours. BNP (last 3 results) No results for input(s): "PROBNP" in the last 8760 hours. HbA1C: No results for input(s): "HGBA1C" in the last 72 hours. CBG: Recent Labs  Lab 10/11/22 1930 10/11/22 2327 10/12/22 0342 10/12/22 0735 10/12/22 1141  GLUCAP 102* 149* 121* 101* 130*   Lipid Profile: No results for input(s): "CHOL", "HDL", "LDLCALC", "TRIG", "CHOLHDL", "LDLDIRECT" in the last 72 hours. Thyroid Function Tests: No results for input(s): "TSH", "T4TOTAL", "FREET4", "T3FREE", "THYROIDAB" in the last 72 hours. Anemia Panel: No results for input(s): "VITAMINB12", "FOLATE", "FERRITIN", "TIBC", "IRON", "RETICCTPCT" in the last 72 hours. Sepsis Labs: Recent Labs  Lab 10/07/22 1514 10/07/22 1700 10/07/22 2037 10/08/22 0115 10/08/22 0302 10/08/22 0558  PROCALCITON 0.12  --   --   --   --   --   LATICACIDVEN  --    < > 3.3* 3.5* 2.3* 2.4*   < > = values in this interval not displayed.    Recent Results (from the past 240 hour(s))  Resp Panel by RT-PCR (Flu A&B, Covid) Anterior Nasal Swab     Status: None   Collection Time: 10/07/22  2:08 PM   Specimen: Anterior Nasal Swab  Result Value Ref Range Status   SARS Coronavirus 2 by RT PCR NEGATIVE NEGATIVE Final    Comment: (NOTE) SARS-CoV-2 target nucleic acids are NOT DETECTED.  The SARS-CoV-2 RNA is generally detectable in upper respiratory specimens during the acute phase of infection. The lowest concentration of SARS-CoV-2 viral copies this assay can detect  is 138 copies/mL. A negative result does not preclude SARS-Cov-2 infection and should not be used as the sole basis for treatment or other patient management decisions. A negative result may occur with   improper specimen collection/handling, submission of specimen other than nasopharyngeal swab, presence of viral mutation(s) within the areas targeted by this assay, and inadequate number of viral copies(<138 copies/mL). A negative result must be combined with clinical observations, patient history, and epidemiological information. The expected result is Negative.  Fact Sheet for Patients:  EntrepreneurPulse.com.au  Fact Sheet for Healthcare Providers:  IncredibleEmployment.be  This test is no t yet approved or cleared by the [place] FDA and  has been authorized for detection and/or diagnosis of SARS-CoV-2 by FDA under an Emergency Use Authorization (EUA). This EUA will remain  in effect (meaning this test can be used) for the duration of the COVID-19 declaration under Section 564(b)(1) of the Act, 21 U.S.C.section 360bbb-3(b)(1), unless the authorization is terminated  or revoked sooner.       Influenza A by PCR NEGATIVE NEGATIVE Final   Influenza B by PCR NEGATIVE NEGATIVE Final    Comment: (NOTE) The Xpert Xpress SARS-CoV-2/FLU/RSV plus assay is intended as an aid in the diagnosis of influenza from Nasopharyngeal swab specimens and should not be used as a sole basis for treatment. Nasal washings and aspirates are unacceptable for Xpert Xpress SARS-CoV-2/FLU/RSV testing.  Fact Sheet for Patients: EntrepreneurPulse.com.au  Fact Sheet for Healthcare Providers: IncredibleEmployment.be  This test is not yet approved or cleared by the [place] FDA and has been authorized for detection and/or diagnosis of SARS-CoV-2 by FDA under an Emergency Use Authorization (EUA). This EUA will remain in effect (meaning this test can be used) for the duration of the COVID-19 declaration under Section 564(b)(1) of the Act, 21 U.S.C. section 360bbb-3(b)(1), unless the authorization is terminated  or revoked.  Performed at [facility], [address] [place], [place] [address]   Culture, blood (routine x 2)     Status: None   Collection Time: 10/07/22  4:00 PM   Specimen: BLOOD  Result Value Ref Range Status   Specimen Description BLOOD BLOOD LEFT ARM  Final   Special Requests   Final    BOTTLES DRAWN AEROBIC AND ANAEROBIC Blood Culture adequate volume   Culture   Final    NO GROWTH 5 DAYS Performed at [facility], [address], [place], [place] [address]    Report Status 10/12/2022 FINAL  Final  Urine Culture     Status: Abnormal   Collection Time: 10/07/22  4:45 PM   Specimen: Urine, Random  Result Value Ref Range Status   Specimen Description   Final    URINE, RANDOM Performed at [facility], [address] [place], [place] [address]    Special Requests   Final    NONE Performed at [facility], [address] [place], [place] [address]    Culture 30,000 COLONIES/mL ESCHERICHIA COLI (A)  Final   Report Status 10/10/2022 FINAL  Final   Organism ID, Bacteria ESCHERICHIA COLI (A)  Final      Susceptibility   Escherichia coli - MIC*    AMPICILLIN 4 SENSITIVE Sensitive     CEFAZOLIN <=4 SENSITIVE Sensitive     CEFEPIME <=0.12 SENSITIVE Sensitive     CEFTRIAXONE <=0.25 SENSITIVE Sensitive     CIPROFLOXACIN <=0.25 SENSITIVE Sensitive     GENTAMICIN <=1 SENSITIVE Sensitive     IMIPENEM <=0.25 SENSITIVE Sensitive  NITROFURANTOIN <=16 SENSITIVE Sensitive     TRIMETH/SULFA <=20 SENSITIVE Sensitive     AMPICILLIN/SULBACTAM <=2 SENSITIVE Sensitive     PIP/TAZO <=4 SENSITIVE Sensitive     * 30,000 COLONIES/mL ESCHERICHIA COLI  Culture, blood (routine x 2)     Status: None   Collection Time: 10/07/22  8:37 PM   Specimen: BLOOD RIGHT HAND  Result Value Ref Range Status   Specimen Description BLOOD RIGHT HAND  Final   Special Requests   Final    BOTTLES DRAWN AEROBIC ONLY Blood Culture adequate volume   Culture   Final     NO GROWTH 5 DAYS Performed at [facility], [address] [place], [place] [address]    Report Status 10/12/2022 FINAL  Final  MRSA Next Gen by PCR, Nasal     Status: None   Collection Time: 10/08/22 12:35 AM   Specimen: Nasal Mucosa; Nasal Swab  Result Value Ref Range Status   MRSA by PCR Next Gen NOT DETECTED NOT DETECTED Final    Comment: (NOTE) The GeneXpert MRSA Assay (FDA approved for NASAL specimens only), is one component of a comprehensive MRSA colonization surveillance program. It is not intended to diagnose MRSA infection nor to guide or monitor treatment for MRSA infections. Test performance is not FDA approved in patients less than 16 years old. Performed at [facility], [address], [place], [place] [address]     Radiology Studies: No results found.  Scheduled Meds:  aspirin EC  81 mg Oral Daily   atorvastatin  20 mg Oral QHS   carbidopa-levodopa  2 tablet Oral TID   carbidopa-levodopa  2 tablet Oral QHS   And   entacapone  200 mg Oral QHS   Chlorhexidine Gluconate Cloth  6 each Topical Daily   donepezil  10 mg Oral QHS   DULoxetine  60 mg Oral Daily   famotidine  20 mg Oral QHS   folic acid  1 mg Oral Daily   hydrocortisone sod succinate (SOLU-CORTEF) inj  50 mg Intravenous Q12H   melatonin  5 mg Oral QHS   memantine  10 mg Oral BID   metoprolol tartrate  25 mg Oral BID   multivitamin with minerals  1 tablet Oral Daily   nystatin  5 mL Oral QID   pregabalin  150 mg Oral QHS   vitamin E  400 Units Oral Daily   Continuous Infusions:  sodium chloride Stopped (10/07/22 2256)     LOS: 5 days    Time spent: 35 mins    Rosealie Reach, MD [facility]   If 7PM-7AM, please contact night-coverage

## 2022-10-13 DIAGNOSIS — A419 Sepsis, unspecified organism: Secondary | ICD-10-CM | POA: Diagnosis not present

## 2022-10-13 DIAGNOSIS — R6520 Severe sepsis without septic shock: Secondary | ICD-10-CM | POA: Diagnosis not present

## 2022-10-13 LAB — CBC WITH DIFFERENTIAL/PLATELET
Abs Immature Granulocytes: 0.92 10*3/uL — ABNORMAL HIGH (ref 0.00–0.07)
Basophils Absolute: 0.1 10*3/uL (ref 0.0–0.1)
Basophils Relative: 0 %
Eosinophils Absolute: 0.1 10*3/uL (ref 0.0–0.5)
Eosinophils Relative: 1 %
HCT: 29.1 % — ABNORMAL LOW (ref 36.0–46.0)
Hemoglobin: 9.5 g/dL — ABNORMAL LOW (ref 12.0–15.0)
Immature Granulocytes: 5 %
Lymphocytes Relative: 12 %
Lymphs Abs: 2.2 10*3/uL (ref 0.7–4.0)
MCH: 32.4 pg (ref 26.0–34.0)
MCHC: 32.6 g/dL (ref 30.0–36.0)
MCV: 99.3 fL (ref 80.0–100.0)
Monocytes Absolute: 1.6 10*3/uL — ABNORMAL HIGH (ref 0.1–1.0)
Monocytes Relative: 9 %
Neutro Abs: 13.6 10*3/uL — ABNORMAL HIGH (ref 1.7–7.7)
Neutrophils Relative %: 73 %
Platelets: 142 10*3/uL — ABNORMAL LOW (ref 150–400)
RBC: 2.93 MIL/uL — ABNORMAL LOW (ref 3.87–5.11)
RDW: 14.2 % (ref 11.5–15.5)
WBC: 18.4 10*3/uL — ABNORMAL HIGH (ref 4.0–10.5)
nRBC: 0.2 % (ref 0.0–0.2)

## 2022-10-13 LAB — MAGNESIUM: Magnesium: 2.2 mg/dL (ref 1.7–2.4)

## 2022-10-13 LAB — GLUCOSE, CAPILLARY
Glucose-Capillary: 105 mg/dL — ABNORMAL HIGH (ref 70–99)
Glucose-Capillary: 105 mg/dL — ABNORMAL HIGH (ref 70–99)
Glucose-Capillary: 111 mg/dL — ABNORMAL HIGH (ref 70–99)
Glucose-Capillary: 82 mg/dL (ref 70–99)
Glucose-Capillary: 93 mg/dL (ref 70–99)
Glucose-Capillary: 97 mg/dL (ref 70–99)

## 2022-10-13 LAB — HEPARIN LEVEL (UNFRACTIONATED): Heparin Unfractionated: 0.47 IU/mL (ref 0.30–0.70)

## 2022-10-13 LAB — PHOSPHORUS: Phosphorus: 1.8 mg/dL — ABNORMAL LOW (ref 2.5–4.6)

## 2022-10-13 MED ORDER — LEVOFLOXACIN IN D5W 750 MG/150ML IV SOLN
750.0000 mg | Freq: Every day | INTRAVENOUS | Status: DC
  Administered 2022-10-13 – 2022-10-17 (×5): 750 mg via INTRAVENOUS
  Filled 2022-10-13 (×5): qty 150

## 2022-10-13 MED ORDER — HEPARIN (PORCINE) 25000 UT/250ML-% IV SOLN
1050.0000 [IU]/h | INTRAVENOUS | Status: DC
  Administered 2022-10-13: 1200 [IU]/h via INTRAVENOUS
  Administered 2022-10-14: 1100 [IU]/h via INTRAVENOUS
  Filled 2022-10-13 (×2): qty 250

## 2022-10-13 MED ORDER — K PHOS MONO-SOD PHOS DI & MONO 155-852-130 MG PO TABS
250.0000 mg | ORAL_TABLET | Freq: Three times a day (TID) | ORAL | Status: AC
  Administered 2022-10-13 – 2022-10-14 (×6): 250 mg via ORAL
  Filled 2022-10-13 (×6): qty 1

## 2022-10-13 MED ORDER — POTASSIUM CHLORIDE 20 MEQ PO PACK
40.0000 meq | PACK | Freq: Once | ORAL | Status: AC
  Administered 2022-10-13: 40 meq via ORAL
  Filled 2022-10-13: qty 2

## 2022-10-13 NOTE — Progress Notes (Signed)
PROGRESS NOTE    Morgan Dennis  WEX:[id number] DOB: 12/24/1933 DOA: 10/07/2022  PCP: Jerrol Banana., MD    Brief Narrative: This 88 years old female with PMH significant for hypertension, hyperlipidemia, dementia, Parkinson disease, anxiety, depression, rheumatoid arthritis and recent COVID-19 infection presented in the ED from memory care center with chief complaint of syncope and hypotension.  Patient had witnessed syncopal episode in memory unit and was found to be hypotensive.  She was recently hospitalized and was treated with paxloid  and steroids for COVID-19 infection.  She was discharged on 2 L of supplemental oxygen.  Patient was found to be septic and started empirically on antibiotics due to suspected colitis and possible HCAP versus aspiration pneumonia.  Patient was hypotensive requiring pressor support. TRH pick up 10/11/22.  Assessment & Plan:   Principal Problem:   Severe sepsis ([place]) Active Problems:   Acute colitis   HCAP (healthcare-associated pneumonia)   Myocardial injury   AKI (acute kidney injury) ([place])   Essential hypertension   Parkinson's disease   Rheumatoid arthritis ([place])   Syncope   Dementia without behavioral disturbance ([place])   Depression with anxiety   Severe sepsis with acute organ dysfunction (H[facility])  Acute metabolic encephalopathy > multifactorial. CT head no acute abnormality.  Avoid sedating medications. Could be due to dementia, Parkinson's disease. Continue Sinemet, Namenda and Aricept for dementia Mental status improved, seems at her baseline.  Acute on chronic hypoxic respiratory failure: Acute bilateral pulmonary embolism: Family does not want any aggressive intervention. Continue supplemental oxygen and wean as tolerated.  She remains on 6 L of supplemental oxygen. Patient is on anticoagulation but DOAC will need to be discussed prior to discharge given risk of falls with her dementia and Parkinson's disease.   Initiated on  heparin infusion, discontinued 10/26 since patient coughed up blood.  AKI likely prerenal due to ATN. Renal functions improved.  Thrombocytopenia: Continue to monitor platelet count while on heparin. Platelets improved to 142.  Septic shock secondary to Suspected colitis/ HCAP: Patient was hypotensive requiring Levophed support. She was started on stress dose steroids.  Septic shock resolved Patient reports having abdominal pain. CT abdomen shows possible colitis. Initiated on  broad-spectrum antibiotics aztreonam and metronidazole Blood and urine cultures no growth so far. Antibiotic discontinued after 6 days.  WBC trending down 18.6  Goals of care discussion Discussed with son at bedside.  Explained in detail about the poor prognosis. Son states that he does not want her to be aggressively managed. If there is decline from current situation they are agreeable to transition to comfort care.   DVT prophylaxis: SCDs Code Status: DNR Family Communication: Spoke with son at bedside. Disposition Plan:   Status is: Inpatient Remains inpatient appropriate because: Admitted for acute metabolic encephalopathy and septic shock requiring Levophed support.  She is also found to have pulmonary embolism requiring anticoagulation.  Palliative care consulted to discuss goals of care.   Consultants:  PCCM  Procedures: None Antimicrobials: Anti-infectives (From admission, onward)    Start     Dose/Rate Route Frequency Ordered Stop   10/10/22 0100  vancomycin (VANCOREADY) IVPB 750 mg/150 mL  Status:  Discontinued       See Hyperspace for full Linked Orders Report.   750 mg 150 mL/hr over 60 Minutes Intravenous Every 24 hours 10/09/22 0009 10/09/22 1017   10/09/22 0100  vancomycin (VANCOREADY) IVPB 1500 mg/300 mL       See Hyperspace for full Linked Orders Report.  1,500 mg 150 mL/hr over 120 Minutes Intravenous  Once 10/09/22 0009 10/09/22 0247   10/08/22 0400  metroNIDAZOLE (FLAGYL)  IVPB 500 mg  Status:  Discontinued        500 mg 100 mL/hr over 60 Minutes Intravenous Every 12 hours 10/07/22 1823 10/12/22 1228   10/07/22 2200  aztreonam (AZACTAM) 1 g in sodium chloride 0.9 % 100 mL IVPB  Status:  Discontinued        1 g 200 mL/hr over 30 Minutes Intravenous Every 8 hours 10/07/22 1859 10/12/22 1228   10/07/22 1902  vancomycin variable dose per unstable renal function (pharmacist dosing)  Status:  Discontinued         Does not apply See admin instructions 10/07/22 1906 10/09/22 0012   10/07/22 1845  vancomycin (VANCOREADY) IVPB 2000 mg/400 mL        2,000 mg 200 mL/hr over 120 Minutes Intravenous  Once 10/07/22 1841 10/07/22 2358   10/07/22 1615  aztreonam (AZACTAM) 2 g in sodium chloride 0.9 % 100 mL IVPB  Status:  Discontinued        2 g 200 mL/hr over 30 Minutes Intravenous Every 8 hours 10/07/22 1603 10/07/22 1859   10/07/22 1615  vancomycin (VANCOCIN) IVPB 1000 mg/200 mL premix  Status:  Discontinued        1,000 mg 200 mL/hr over 60 Minutes Intravenous  Once 10/07/22 1603 10/07/22 1841   10/07/22 1615  metroNIDAZOLE (FLAGYL) IVPB 500 mg        500 mg 100 mL/hr over 60 Minutes Intravenous  Once 10/07/22 1603 10/07/22 1848       Subjective: Patient was seen and examined at bedside.Overnight events noted. Patient is chronically ill looking, very deconditioned.  Very alert and following commands today She denies any further blood in the phlegm.  Heparin was discontinued yesterday. Patient has tolerated mechanical soft diet.  Objective: Vitals:   10/12/22 2010 10/13/22 0025 10/13/22 0418 10/13/22 0754  BP: (!) 145/68 114/60 131/66 (!) 161/77  Pulse: 76 73 70 70  Resp: '18 18 20 12  '$ Temp: 97.8 F (36.6 C) 97.8 F (36.6 C) 97.6 F (36.4 C) 97.8 F (36.6 C)  TempSrc: Oral Oral Oral Oral  SpO2: 96% 100% 97% 98%  Weight:      Height:        Intake/Output Summary (Last 24 hours) at 10/13/2022 1130 Last data filed at 10/12/2022 1330 Gross per 24 hour   Intake 123.62 ml  Output --  Net 123.62 ml   Filed Weights   10/07/22 1800 10/08/22 0037  Weight: 84.9 kg 80.4 kg    Examination:  General exam: Appears comfortable, NAD, chronically ill looking, deconditioned. Respiratory system: CTA bilaterally, respiratory effort normal, RR 15 Cardiovascular system: S1 & S2 heard, regular rate and rhythm, no murmur. Gastrointestinal system: Abdomen is soft, non tender, non distended, BS+ Central nervous system: Alert, oriented x 2, no focal neurological deficits. Extremities: No edema, no cyanosis, no clubbing Skin: No rashes, lesions or ulcers Psychiatry: Mood & affect appropriate.     Data Reviewed: I have personally reviewed following labs and imaging studies  CBC: Recent Labs  Lab 10/07/22 1501 10/08/22 0115 10/10/22 0101 10/11/22 0335 10/12/22 0446 10/13/22 0330  WBC 14.3* 25.5* 25.5* 16.4* 20.6* 18.4*  NEUTROABS 10.6*  --  20.6* 12.9* 16.2* 13.6*  HGB 16.6* 14.3 10.1* 9.6* 9.4* 9.5*  HCT 49.8* 43.8 30.7* 28.8* 28.9* 29.1*  MCV 96.5 98.4 99.7 96.6 98.0 99.3  PLT 176 155 86*  85* 119* 478*   Basic Metabolic Panel: Recent Labs  Lab 10/08/22 0115 10/09/22 0742 10/09/22 1828 10/10/22 0101 10/10/22 0705 10/10/22 2035 10/11/22 0335 10/12/22 0446 10/13/22 0330  NA 134* 138  --  139  --   --  141 142  --   K 3.3* 2.8*   < > 3.3*  3.5 3.4* 3.6 3.5 3.3*  --   CL 99 107  --  108  --   --  111 111  --   CO2 24 24  --  27  --   --  25 24  --   GLUCOSE 182* 154*  --  87  --   --  115* 119*  --   BUN 35* 23  --  23  --   --  23 16  --   CREATININE 1.47* 0.75  --  0.69  --   --  0.58 0.54  --   CALCIUM 7.9* 7.5*  --  7.3*  --   --  7.4* 7.2*  --   MG  --  2.0  --  2.1  --   --  2.2 2.2 2.2  PHOS 4.3  --    < > 2.2*  3.0 2.0* 1.6* 3.7 2.2* 1.8*   < > = values in this interval not displayed.   GFR: Estimated Creatinine Clearance: 52.8 mL/min (by C-G formula based on SCr of 0.54 mg/dL). Liver Function Tests: Recent Labs   Lab 10/07/22 1501 10/07/22 2223  AST 56* 43*  ALT 8 8  ALKPHOS 111 110  BILITOT 1.6* 1.7*  PROT 8.1 4.9*  ALBUMIN 4.9 2.7*   Recent Labs  Lab 10/07/22 1501  LIPASE 48   No results for input(s): "AMMONIA" in the last 168 hours. Coagulation Profile: Recent Labs  Lab 10/08/22 1021  INR 1.5*   Cardiac Enzymes: No results for input(s): "CKTOTAL", "CKMB", "CKMBINDEX", "TROPONINI" in the last 168 hours. BNP (last 3 results) No results for input(s): "PROBNP" in the last 8760 hours. HbA1C: No results for input(s): "HGBA1C" in the last 72 hours. CBG: Recent Labs  Lab 10/12/22 1627 10/12/22 2020 10/13/22 0022 10/13/22 0415 10/13/22 0729  GLUCAP 92 114* 105* 82 105*   Lipid Profile: No results for input(s): "CHOL", "HDL", "LDLCALC", "TRIG", "CHOLHDL", "LDLDIRECT" in the last 72 hours. Thyroid Function Tests: No results for input(s): "TSH", "T4TOTAL", "FREET4", "T3FREE", "THYROIDAB" in the last 72 hours. Anemia Panel: No results for input(s): "VITAMINB12", "FOLATE", "FERRITIN", "TIBC", "IRON", "RETICCTPCT" in the last 72 hours. Sepsis Labs: Recent Labs  Lab 10/07/22 1514 10/07/22 1700 10/07/22 2037 10/08/22 0115 10/08/22 0302 10/08/22 0558  PROCALCITON 0.12  --   --   --   --   --   LATICACIDVEN  --    < > 3.3* 3.5* 2.3* 2.4*   < > = values in this interval not displayed.    Recent Results (from the past 240 hour(s))  Resp Panel by RT-PCR (Flu A&B, Covid) Anterior Nasal Swab     Status: None   Collection Time: 10/07/22  2:08 PM   Specimen: Anterior Nasal Swab  Result Value Ref Range Status   SARS Coronavirus 2 by RT PCR NEGATIVE NEGATIVE Final    Comment: (NOTE) SARS-CoV-2 target nucleic acids are NOT DETECTED.  The SARS-CoV-2 RNA is generally detectable in upper respiratory specimens during the acute phase of infection. The lowest concentration of SARS-CoV-2 viral copies this assay can detect is 138 copies/mL. A negative result does not preclude  SARS-Cov-2 infection and should not be used as the sole basis for treatment or other patient management decisions. A negative result may occur with  improper specimen collection/handling, submission of specimen other than nasopharyngeal swab, presence of viral mutation(s) within the areas targeted by this assay, and inadequate number of viral copies(<138 copies/mL). A negative result must be combined with clinical observations, patient history, and epidemiological information. The expected result is Negative.  Fact Sheet for Patients:  EntrepreneurPulse.com.au  Fact Sheet for Healthcare Providers:  IncredibleEmployment.be  This test is no t yet approved or cleared by the [place] FDA and  has been authorized for detection and/or diagnosis of SARS-CoV-2 by FDA under an Emergency Use Authorization (EUA). This EUA will remain  in effect (meaning this test can be used) for the duration of the COVID-19 declaration under Section 564(b)(1) of the Act, 21 U.S.C.section 360bbb-3(b)(1), unless the authorization is terminated  or revoked sooner.       Influenza A by PCR NEGATIVE NEGATIVE Final   Influenza B by PCR NEGATIVE NEGATIVE Final    Comment: (NOTE) The Xpert Xpress SARS-CoV-2/FLU/RSV plus assay is intended as an aid in the diagnosis of influenza from Nasopharyngeal swab specimens and should not be used as a sole basis for treatment. Nasal washings and aspirates are unacceptable for Xpert Xpress SARS-CoV-2/FLU/RSV testing.  Fact Sheet for Patients: EntrepreneurPulse.com.au  Fact Sheet for Healthcare Providers: IncredibleEmployment.be  This test is not yet approved or cleared by the [place] FDA and has been authorized for detection and/or diagnosis of SARS-CoV-2 by FDA under an Emergency Use Authorization (EUA). This EUA will remain in effect (meaning this test can be used) for the duration of  the COVID-19 declaration under Section 564(b)(1) of the Act, 21 U.S.C. section 360bbb-3(b)(1), unless the authorization is terminated or revoked.  Performed at [facility], [address] [place], [place] [address]   Culture, blood (routine x 2)     Status: None   Collection Time: 10/07/22  4:00 PM   Specimen: BLOOD  Result Value Ref Range Status   Specimen Description BLOOD BLOOD LEFT ARM  Final   Special Requests   Final    BOTTLES DRAWN AEROBIC AND ANAEROBIC Blood Culture adequate volume   Culture   Final    NO GROWTH 5 DAYS Performed at [facility], [address], [place], [place] [address]    Report Status 10/12/2022 FINAL  Final  Urine Culture     Status: Abnormal   Collection Time: 10/07/22  4:45 PM   Specimen: Urine, Random  Result Value Ref Range Status   Specimen Description   Final    URINE, RANDOM Performed at [facility], [address] [place], [place] [address]    Special Requests   Final    NONE Performed at [facility], [address] [place],  [address]    Culture 30,000 COLONIES/mL ESCHERICHIA COLI (A)  Final   Report Status 10/10/2022 FINAL  Final   Organism ID, Bacteria ESCHERICHIA COLI (A)  Final      Susceptibility   Escherichia coli - MIC*    AMPICILLIN 4 SENSITIVE Sensitive     CEFAZOLIN <=4 SENSITIVE Sensitive     CEFEPIME <=0.12 SENSITIVE Sensitive     CEFTRIAXONE <=0.25 SENSITIVE Sensitive     CIPROFLOXACIN <=0.25 SENSITIVE Sensitive     GENTAMICIN <=1 SENSITIVE Sensitive     IMIPENEM <=0.25 SENSITIVE Sensitive     NITROFURANTOIN <=16 SENSITIVE Sensitive  TRIMETH/SULFA <=20 SENSITIVE Sensitive     AMPICILLIN/SULBACTAM <=2 SENSITIVE Sensitive     PIP/TAZO <=4 SENSITIVE Sensitive     * 30,000 COLONIES/mL ESCHERICHIA COLI  Culture, blood (routine x 2)     Status: None   Collection Time: 10/07/22  8:37 PM   Specimen: BLOOD RIGHT HAND  Result Value Ref Range Status   Specimen  Description BLOOD RIGHT HAND  Final   Special Requests   Final    BOTTLES DRAWN AEROBIC ONLY Blood Culture adequate volume   Culture   Final    NO GROWTH 5 DAYS Performed at [facility], [address], [place], [place] [address]    Report Status 10/12/2022 FINAL  Final  MRSA Next Gen by PCR, Nasal     Status: None   Collection Time: 10/08/22 12:35 AM   Specimen: Nasal Mucosa; Nasal Swab  Result Value Ref Range Status   MRSA by PCR Next Gen NOT DETECTED NOT DETECTED Final    Comment: (NOTE) The GeneXpert MRSA Assay (FDA approved for NASAL specimens only), is one component of a comprehensive MRSA colonization surveillance program. It is not intended to diagnose MRSA infection nor to guide or monitor treatment for MRSA infections. Test performance is not FDA approved in patients less than 68 years old. Performed at [facility], [address] [place], [place] [address]     Radiology Studies: No results found.  Scheduled Meds:  aspirin EC  81 mg Oral Daily   atorvastatin  20 mg Oral QHS   carbidopa-levodopa  2 tablet Oral TID   carbidopa-levodopa  2 tablet Oral QHS   And   entacapone  200 mg Oral QHS   donepezil  10 mg Oral QHS   DULoxetine  60 mg Oral Daily   famotidine  20 mg Oral QHS   folic acid  1 mg Oral Daily   hydrocortisone sod succinate (SOLU-CORTEF) inj  50 mg Intravenous Q12H   melatonin  5 mg Oral QHS   memantine  10 mg Oral BID   metoprolol tartrate  25 mg Oral BID   multivitamin with minerals  1 tablet Oral Daily   nystatin  5 mL Oral QID   phosphorus  250 mg Oral TID   pregabalin  150 mg Oral QHS   vitamin E  400 Units Oral Daily   Continuous Infusions:  sodium chloride Stopped (10/07/22 2256)     LOS: 6 days    Time spent: 35 mins    Tatijana Bierly, MD [facility]   If 7PM-7AM, please contact night-coverage

## 2022-10-13 NOTE — Progress Notes (Signed)
Speech Language Pathology Treatment: Dysphagia  Patient Details Name: Morgan Dennis MRN: [id number] DOB: 05-07-1934 Today's Date: 10/13/2022 Time: 4098-1191 SLP Time Calculation (min) (ACUTE ONLY): 50 min  Assessment / Plan / Recommendation Clinical Impression  Pt seen today for ongoing assessment of swallowing and trials to upgrade diet consistency. She has been tolerating thin liquids and current diet per NSG report; Pills Crushed/Whole in a Puree.  On [place] O2(baseline support); afebrile.  Pt has Baseline Cognitive decline, Dementia dx per chart notes. She was awake/alert and followed basic directions given cues and support -- she helped to position herself Upright in bed w/ cues from SLP/NSG Student. Noted a mild oral/facial tremor at rest. Pt was eager for Chocolate milk and graham crackers.   Pt appears to present w/ grossly adequate oropharyngeal phase swallow function w/ No overt oropharyngeal phase dysphagia noted, No neuromuscular deficits impacting swallowing noted. Pt consumed po trials w/ No overt, clinical s/s of aspiration during po trials. Pt appears at reduced risk for aspiration following general aspiration precautions.  However, pt does have challenging factors that could impact her oropharyngeal swallowing to include fatigue/weakness, Baseline Cognitive decline/Dementia, and advanced age as well as current issue of acute illness; these issues can increase risk for aspiration, dysphagia as well as decreased oral intake overall.   During po trials, pt consumed all consistencies w/ no overt coughing, decline in vocal quality, or change in respiratory presentation during/post trials. O2 sats remained 97%. Oral phase appeared grossly [facility] w/ timely bolus management, mastication, and control of bolus propulsion for A-P transfer for swallowing. Oral clearing and mastication achieved w/ all trial consistencies -- moistened, soft foods given for ease of mastication.  Pt fed self w/ setup  support.   Recommend a more mech soft consistency diet w/ well-Cut meats, moistened foods; Thin liquids. Recommend general aspiration precautions, Pills WHOLE vs Crushed in Puree for safer, easier swallowing. This was encouraged for Discharge as well.  Education given on Pills in Puree; food consistencies and easy to eat options; general aspiration precautions to pt and NSG stafff. NSG to reconsult if any new needs arise. NSG updated, agreed. MD updated. Recommend Dietician f/u for support.     HPI HPI: Per H&P: Morgan Dennis is a 88 y.o. female with medical history significant of HOH, hypertension, hyperlipidemia, GERD, depression with anxiety, Parkinson's disease, Dementia, rheumatoid arthritis, recent admission due to COVID-19 infection on 2 L oxygen, who presents with syncope, cough, abdominal pain. CT Head 10/08/22: No acute intracranial pathology. Mild age-related atrophy and chronic microvascular ischemic changes. CXR 10/08/22: Right internal jugular central venous catheter in appropriate position. No pneumothorax. Pulmonary hypoinflation. Pt with family in the room who reported that pt was on an unrestricted diet at baseline. O2 via nasal canula. Pt currently on puree solids and thin liquids since initial eval and tolerating well per NSG.      SLP Plan  All goals met      Recommendations for follow up therapy are one component of a multi-disciplinary discharge planning process, led by the attending physician.  Recommendations may be updated based on patient status, additional functional criteria and insurance authorization.    Recommendations  Diet recommendations: Dysphagia 3 (mechanical soft);Thin liquid (appears an appropriate diet consistency for pt) Liquids provided via: Cup;Straw (monitor as needed) Medication Administration: Crushed with puree (vs WHOLE in puree as able) Supervision: Patient able to self feed;Staff to assist with self feeding;Intermittent supervision to cue for  compensatory strategies Compensations: Minimize environmental distractions;Slow  rate;Small sips/bites;Lingual sweep for clearance of pocketing;Follow solids with liquid Postural Changes and/or Swallow Maneuvers: Out of bed for meals;Seated upright 90 degrees;Upright 30-60 min after meal                General recommendations:  (Dietician f/u; Palliative Care f/u) Oral Care Recommendations: Oral care BID;Oral care before and after PO;Staff/trained caregiver to provide oral care (suppot pt) Follow Up Recommendations: No SLP follow up Assistance recommended at discharge: Intermittent Supervision/Assistance (for meal setup) SLP Visit Diagnosis: Dysphagia, unspecified (R13.10) (Baseline Cognitive decline, Dementia) Plan: All goals met              , MS, CCC-SLP Speech Language Pathologist Rehab Services; [facility] [place] [phone] (ascom) ,  10/13/2022, 3:44 PM 

## 2022-10-13 NOTE — Progress Notes (Signed)
[facility] for Heparin Infusion Indication: pulmonary embolus  Patient Measurements: Height: '5\' 4"'$  (162.6 cm) Weight: 80.4 kg (177 lb 4 oz) IBW/kg (Calculated) : 54.7 Heparin Dosing Weight: 72 kg  Labs: Recent Labs    10/11/22 0335 10/11/22 1416 10/11/22 2214 10/12/22 0446 10/13/22 0330  HGB 9.6*  --   --  9.4* 9.5*  HCT 28.8*  --   --  28.9* 29.1*  PLT 85*  --   --  119* 142*  HEPARINUNFRC 0.28* 0.50 0.54 0.56  --   CREATININE 0.58  --   --  0.54  --      Estimated Creatinine Clearance: 52.8 mL/min (by C-G formula based on SCr of 0.54 mg/dL).   Medical History: Past Medical History:  Diagnosis Date   Arthritis    GERD (gastroesophageal reflux disease)    Hyperlipidemia    Parkinson disease    Restless leg syndrome    Medications: Patient was on heparin infusion 10/22 - 10/26 but it was discontinued secondary to hemoptysis  Assessment: Pt is a 88 yo female admitted w/ possible pneumonia & acute colitis, now found w/ "tiny acute pulmonary emboli within the lower lobes bilaterally."  Note: Hgb is stable ~ 9.5 from admission value of 16.6. Suspect in part dilutional decrease. Platelets on admission 176 >> 85 (nadir) >> 142 today. Appears concern for HIT was low (I calculated 4T score of 2)  Goal of Therapy:  Heparin level 0.3 - 0.5 units/ml (no bolus protocol) Monitor platelets by anticoagulation protocol: Yes  Plan:  --Will re-start heparin infusion at 1200 units/hr with no bolus Patient previously therapeutic at this rate this admission --Will check HL 8 hours from re-initiation of infusion --Daily CBC per protocol while on IV heparin; if precipitous drop in platelets will need to consider stopping heparin and initiating HIT work-up   Morgan Dennis 10/13/2022 11:40 AM

## 2022-10-13 NOTE — Plan of Care (Signed)

## 2022-10-13 NOTE — Care Management Important Message (Signed)
Important Message  Patient Details  Name: Morgan Dennis MRN: [id number] Date of Birth: 20-May-1934   Medicare Important Message Given:  Yes     Dannette Barbara 10/13/2022, 11:48 AM

## 2022-10-13 NOTE — TOC Progression Note (Signed)
Transition of Care [facility]) - Progression Note    Patient Details  Name: CRIMSON DUBBERLY MRN: [id number] Date of Birth: Jun 01, 1934  [facility] CM/SW Contact  Laurena Slimmer, RN Phone Number: 10/13/2022, 3:19 PM  Clinical Narrative:    Per MD note family will continue to pursue care. Currently disposition is home. Will reassess for change to comfort care disposition per MD note.    Expected Discharge Plan: [place] Barriers to Discharge: Continued Medical Work up  Expected Discharge Plan and Services Expected Discharge Plan: [place]   Discharge Planning Services: CM Consult Post Acute Care Choice: [place] Living arrangements for the past 2 months: [place]                 DME Arranged: N/A DME Agency: NA                   Social Determinants of Health (SDOH) Interventions    Readmission Risk Interventions     No data to display

## 2022-10-13 NOTE — Plan of Care (Signed)
  Problem: Nutrition: Goal: Adequate nutrition will be maintained Outcome: Progressing   Problem: Elimination: Goal: Will not experience complications related to bowel motility Outcome: Progressing Goal: Will not experience complications related to urinary retention Outcome: Progressing   Problem: Safety: Goal: Ability to remain free from injury will improve Outcome: Progressing   

## 2022-10-13 NOTE — Progress Notes (Signed)
ANTICOAGULATION CONSULT NOTE  Pharmacy Consult for Heparin Infusion Indication: pulmonary embolus  Patient Measurements: Height: '5\' 4"'$  (162.6 cm) Weight: 80.4 kg (177 lb 4 oz) IBW/kg (Calculated) : 54.7 Heparin Dosing Weight: 72 kg  Labs: Recent Labs    10/11/22 0335 10/11/22 1416 10/11/22 2214 10/12/22 0446 10/13/22 0330 10/13/22 2120  HGB 9.6*  --   --  9.4* 9.5*  --   HCT 28.8*  --   --  28.9* 29.1*  --   PLT 85*  --   --  119* 142*  --   HEPARINUNFRC 0.28*   < > 0.54 0.56  --  0.47  CREATININE 0.58  --   --  0.54  --   --    < > = values in this interval not displayed.     Estimated Creatinine Clearance: 52.8 mL/min (by C-G formula based on SCr of 0.54 mg/dL).   Medical History: Past Medical History:  Diagnosis Date   Arthritis    GERD (gastroesophageal reflux disease)    Hyperlipidemia    Parkinson disease    Restless leg syndrome    Medications: Patient was on heparin infusion 10/22 - 10/26 but it was discontinued secondary to hemoptysis  Assessment: Pt is a 88 yo female admitted w/ possible pneumonia & acute colitis, now found w/ "tiny acute pulmonary emboli within the lower lobes bilaterally."  Note: Hgb is stable ~ 9.5 from admission value of 16.6. Suspect in part dilutional decrease. Platelets on admission 176 >> 85 (nadir) >> 142 today. Appears concern for HIT was low (I calculated 4T score of 2)  Goal of Therapy:  Heparin level 0.3 - 0.5 units/ml (no bolus protocol) Monitor platelets by anticoagulation protocol: Yes  Plan: Heparin level therapeutic x 1 after restarting heparin infusion --Continue heparin infusion at 1200 units/hr  --Confirmatory heparin level in 8 hours --Daily CBC per protocol while on IV heparin; if precipitous drop in platelets will need to consider stopping heparin and initiating HIT work-up   Wynelle Cleveland 10/13/2022 10:05 PM

## 2022-10-13 NOTE — Progress Notes (Incomplete)
[facility] for Heparin Infusion Indication: pulmonary embolus  Patient Measurements: Height: '5\' 4"'$  (162.6 cm) Weight: 80.4 kg (177 lb 4 oz) IBW/kg (Calculated) : 54.7 Heparin Dosing Weight: 72 kg  Labs: Recent Labs    10/11/22 0335 10/11/22 1416 10/11/22 2214 10/12/22 0446 10/13/22 0330  HGB 9.6*  --   --  9.4* 9.5*  HCT 28.8*  --   --  28.9* 29.1*  PLT 85*  --   --  119* 142*  HEPARINUNFRC 0.28* 0.50 0.54 0.56  --   CREATININE 0.58  --   --  0.54  --      Estimated Creatinine Clearance: 52.8 mL/min (by C-G formula based on SCr of 0.54 mg/dL).   Medical History: Past Medical History:  Diagnosis Date   Arthritis    GERD (gastroesophageal reflux disease)    Hyperlipidemia    Parkinson disease    Restless leg syndrome    Medications: Patient was on heparin infusion 10/22 - 10/26 but it was discontinued secondary to hemoptysis  Assessment: Pt is a 88 yo female admitted w/ possible pneumonia & acute colitis, now found w/ "tiny acute pulmonary emboli within the lower lobes bilaterally."  Note: Hgb is stable ~ 9.5 from admission value of 16.6. Suspect in part dilutional decrease. Platelets on admission 176 >> 85 (nadir) >> 142 today. Appears concern for HIT was low (I calculated 4T score of 2)  Goal of Therapy:  Heparin level 0.3 - 0.5 units/ml (no bolus protocol) Monitor platelets by anticoagulation protocol: Yes  Plan: Heparin supra-therapeutic --Decrease heparin infusion to 1100 units/hr  --Heparin level 8 hours after rate change --Daily CBC per protocol while on IV heparin; if precipitous drop in platelets will need to consider stopping heparin and initiating HIT work-up   Morgan Dennis, PharmD Clinical Pharmacist  10/14/2022 6:29 AM

## 2022-10-14 DIAGNOSIS — A419 Sepsis, unspecified organism: Secondary | ICD-10-CM | POA: Diagnosis not present

## 2022-10-14 DIAGNOSIS — R6520 Severe sepsis without septic shock: Secondary | ICD-10-CM | POA: Diagnosis not present

## 2022-10-14 LAB — CBC WITH DIFFERENTIAL/PLATELET
Abs Immature Granulocytes: 1.65 10*3/uL — ABNORMAL HIGH (ref 0.00–0.07)
Basophils Absolute: 0.1 10*3/uL (ref 0.0–0.1)
Basophils Relative: 1 %
Eosinophils Absolute: 0.2 10*3/uL (ref 0.0–0.5)
Eosinophils Relative: 1 %
HCT: 30.8 % — ABNORMAL LOW (ref 36.0–46.0)
Hemoglobin: 9.9 g/dL — ABNORMAL LOW (ref 12.0–15.0)
Immature Granulocytes: 9 %
Lymphocytes Relative: 15 %
Lymphs Abs: 2.7 10*3/uL (ref 0.7–4.0)
MCH: 33 pg (ref 26.0–34.0)
MCHC: 32.1 g/dL (ref 30.0–36.0)
MCV: 102.7 fL — ABNORMAL HIGH (ref 80.0–100.0)
Monocytes Absolute: 1.7 10*3/uL — ABNORMAL HIGH (ref 0.1–1.0)
Monocytes Relative: 9 %
Neutro Abs: 11.9 10*3/uL — ABNORMAL HIGH (ref 1.7–7.7)
Neutrophils Relative %: 65 %
Platelets: 177 10*3/uL (ref 150–400)
RBC: 3 MIL/uL — ABNORMAL LOW (ref 3.87–5.11)
RDW: 14.4 % (ref 11.5–15.5)
WBC: 18.2 10*3/uL — ABNORMAL HIGH (ref 4.0–10.5)
nRBC: 0.1 % (ref 0.0–0.2)

## 2022-10-14 LAB — BASIC METABOLIC PANEL
Anion gap: 4 — ABNORMAL LOW (ref 5–15)
BUN: 13 mg/dL (ref 8–23)
CO2: 27 mmol/L (ref 22–32)
Calcium: 7.7 mg/dL — ABNORMAL LOW (ref 8.9–10.3)
Chloride: 110 mmol/L (ref 98–111)
Creatinine, Ser: 0.53 mg/dL (ref 0.44–1.00)
GFR, Estimated: >60 mL/min (ref >60)
Glucose, Bld: 113 mg/dL — ABNORMAL HIGH (ref 70–99)
Potassium: 4.3 mmol/L (ref 3.5–5.1)
Sodium: 141 mmol/L (ref 135–145)

## 2022-10-14 LAB — HEPARIN LEVEL (UNFRACTIONATED)
Heparin Unfractionated: 0.52 IU/mL (ref 0.30–0.70)
Heparin Unfractionated: 0.48 IU/mL (ref 0.30–0.70)

## 2022-10-14 LAB — GLUCOSE, CAPILLARY
Glucose-Capillary: 102 mg/dL — ABNORMAL HIGH (ref 70–99)
Glucose-Capillary: 118 mg/dL — ABNORMAL HIGH (ref 70–99)
Glucose-Capillary: 130 mg/dL — ABNORMAL HIGH (ref 70–99)
Glucose-Capillary: 178 mg/dL — ABNORMAL HIGH (ref 70–99)
Glucose-Capillary: 86 mg/dL (ref 70–99)
Glucose-Capillary: 94 mg/dL (ref 70–99)

## 2022-10-14 LAB — PHOSPHORUS: Phosphorus: 2.8 mg/dL (ref 2.5–4.6)

## 2022-10-14 LAB — MAGNESIUM: Magnesium: 2 mg/dL (ref 1.7–2.4)

## 2022-10-14 MED ORDER — TRAZODONE HCL 50 MG PO TABS
50.0000 mg | ORAL_TABLET | Freq: Once | ORAL | Status: AC
  Administered 2022-10-15: 50 mg via ORAL
  Filled 2022-10-14: qty 1

## 2022-10-14 MED ORDER — HYDROCORTISONE SOD SUC (PF) 100 MG IJ SOLR
50.0000 mg | Freq: Every day | INTRAMUSCULAR | Status: DC
  Administered 2022-10-15 – 2022-10-16 (×2): 50 mg via INTRAVENOUS
  Filled 2022-10-14 (×2): qty 1

## 2022-10-14 NOTE — Progress Notes (Signed)
ANTICOAGULATION CONSULT NOTE  Pharmacy Consult for Heparin Infusion Indication: pulmonary embolus  Patient Measurements: Height: '5\' 4"'$  (162.6 cm) Weight: 80.4 kg (177 lb 4 oz) IBW/kg (Calculated) : 54.7 Heparin Dosing Weight: 72 kg  Labs: Recent Labs    10/12/22 0446 10/13/22 0330 10/13/22 2120 10/14/22 0521 10/14/22 1412  HGB 9.4* 9.5*  --  9.9*  --   HCT 28.9* 29.1*  --  30.8*  --   PLT 119* 142*  --  177  --   HEPARINUNFRC 0.56  --  0.47 0.52 0.48  CREATININE 0.54  --   --  0.53  --      Estimated Creatinine Clearance: 52.8 mL/min (by C-G formula based on SCr of 0.53 mg/dL).   Medical History: Past Medical History:  Diagnosis Date   Arthritis    GERD (gastroesophageal reflux disease)    Hyperlipidemia    Parkinson disease    Restless leg syndrome    Medications: Patient was on heparin infusion 10/22 - 10/26 but it was discontinued secondary to hemoptysis  Assessment: Pt is a 88 yo female admitted w/ possible pneumonia & acute colitis, now found w/ "tiny acute pulmonary emboli within the lower lobes bilaterally."  Note: Hgb is stable ~ 9.5 from admission value of 16.6. Suspect in part dilutional decrease. Platelets on admission 176 >> 85 (nadir) >> 142 today. Appears concern for HIT was low (I calculated 4T score of 2)  Goal of Therapy:  Heparin level 0.3 - 0.5 units/ml (no bolus protocol) Monitor platelets by anticoagulation protocol: Yes  Plan:  Heparin level 0.48, therapeutic --Continue heparin infusion at 1100 units/hr  --Daily CBC per protocol while on IV heparin; if precipitous drop in platelets will need to consider stopping heparin and initiating HIT work-up   Darnelle Bos, PharmD 10/14/2022 3:10 PM

## 2022-10-14 NOTE — Progress Notes (Addendum)
PROGRESS NOTE    Morgan Dennis  TOI:[id number] DOB: Jan 17, 1934 DOA: 10/07/2022  PCP: Jerrol Banana., MD    Brief Narrative: This 88 years old female with PMH significant for hypertension, hyperlipidemia, dementia, Parkinson disease, anxiety, depression, rheumatoid arthritis and recent COVID-19 infection presented in the ED from memory care center with chief complaint of syncope and hypotension.  Patient had witnessed syncopal episode in memory unit and was found to be hypotensive.  She was recently hospitalized and was treated with paxloid  and steroids for COVID-19 infection.  She was discharged on 2 L of supplemental oxygen.  Patient was found to be septic and started empirically on antibiotics due to suspected colitis and possible HCAP versus aspiration pneumonia.  Patient was hypotensive requiring pressor support. TRH pick up 10/11/22.  Assessment & Plan:   Principal Problem:   Severe sepsis ([facility]) Active Problems:   Acute colitis   HCAP (healthcare-associated pneumonia)   Myocardial injury   AKI (acute kidney injury) ([facility])   Essential hypertension   Parkinson's disease   Rheumatoid arthritis ([facility])   Syncope   Dementia without behavioral disturbance ([facility])   Depression with anxiety   Severe sepsis with acute organ dysfunction ([facility])  Acute metabolic encephalopathy > multifactorial. CT head no acute abnormality.  Avoid sedating medications. Could be due to dementia, Parkinson's disease. Continue Sinemet, Namenda and Aricept for dementia Mental status improved, seems at her baseline.  Acute on chronic hypoxic respiratory failure: Acute bilateral pulmonary embolism: Family does not want any aggressive intervention. Continue supplemental oxygen and wean as tolerated.  She weaned down to 5L of supplemental oxygen. Patient is on anticoagulation but DOAC will need to be discussed prior to discharge given risk of falls with her dementia and Parkinson's disease.   Initiated on  heparin infusion, discontinued 10/26 since patient coughed up blood. Heparin resumed.  Continue to monitor platelet count. Spoke with son alitzel cookson, given risk of falls with a dementia and Parkinson's disease.  She would not be discharged on Eliquis.  AKI likely prerenal due to ATN. Renal functions improved.  Thrombocytopenia:> Resolved. Continue to monitor platelet count while on heparin. Platelets improved to 85> 119> 142>177  Septic shock secondary to Suspected colitis/ HCAP: Patient was hypotensive requiring Levophed support. She was started on stress dose steroids.  Septic shock resolved Patient reports having abdominal pain. CT abdomen shows possible colitis. Initiated on broad-spectrum antibiotics aztreonam and metronidazole Blood and urine cultures no growth so far. Antibiotics discontinued after 6 days.  WBC trending down 18.2 Levaquin restarted given high WBC count and risk for aspiration.  Goals of care discussion Discussed with son at bedside.  Explained in detail about the poor prognosis. Son states that he does not want her to be aggressively managed. If there is decline from current situation they are agreeable to transition to comfort care.   DVT prophylaxis: heparin gtt Code Status: DNR Family Communication: Spoke with son at bedside. Disposition Plan:   Status is: Inpatient Remains inpatient appropriate because: Admitted for acute metabolic encephalopathy and septic shock requiring Levophed support.  She is also found to have pulmonary embolism requiring anticoagulation.  Palliative care consulted to discuss goals of care.   Consultants:  PCCM  Procedures: None  Antimicrobials: Anti-infectives (From admission, onward)    Start     Dose/Rate Route Frequency Ordered Stop   10/13/22 1230  levofloxacin (LEVAQUIN) IVPB 750 mg        750 mg 100 mL/hr over 90 Minutes Intravenous  Daily 10/13/22 1138     10/10/22 0100  vancomycin (VANCOREADY) IVPB 750 mg/150  mL  Status:  Discontinued       See Hyperspace for full Linked Orders Report.   750 mg 150 mL/hr over 60 Minutes Intravenous Every 24 hours 10/09/22 0009 10/09/22 1017   10/09/22 0100  vancomycin (VANCOREADY) IVPB 1500 mg/300 mL       See Hyperspace for full Linked Orders Report.   1,500 mg 150 mL/hr over 120 Minutes Intravenous  Once 10/09/22 0009 10/09/22 0247   10/08/22 0400  metroNIDAZOLE (FLAGYL) IVPB 500 mg  Status:  Discontinued        500 mg 100 mL/hr over 60 Minutes Intravenous Every 12 hours 10/07/22 1823 10/12/22 1228   10/07/22 2200  aztreonam (AZACTAM) 1 g in sodium chloride 0.9 % 100 mL IVPB  Status:  Discontinued        1 g 200 mL/hr over 30 Minutes Intravenous Every 8 hours 10/07/22 1859 10/12/22 1228   10/07/22 1902  vancomycin variable dose per unstable renal function (pharmacist dosing)  Status:  Discontinued         Does not apply See admin instructions 10/07/22 1906 10/09/22 0012   10/07/22 1845  vancomycin (VANCOREADY) IVPB 2000 mg/400 mL        2,000 mg 200 mL/hr over 120 Minutes Intravenous  Once 10/07/22 1841 10/07/22 2358   10/07/22 1615  aztreonam (AZACTAM) 2 g in sodium chloride 0.9 % 100 mL IVPB  Status:  Discontinued        2 g 200 mL/hr over 30 Minutes Intravenous Every 8 hours 10/07/22 1603 10/07/22 1859   10/07/22 1615  vancomycin (VANCOCIN) IVPB 1000 mg/200 mL premix  Status:  Discontinued        1,000 mg 200 mL/hr over 60 Minutes Intravenous  Once 10/07/22 1603 10/07/22 1841   10/07/22 1615  metroNIDAZOLE (FLAGYL) IVPB 500 mg        500 mg 100 mL/hr over 60 Minutes Intravenous  Once 10/07/22 1603 10/07/22 1848       Subjective: Patient was seen and examined at bedside.Overnight events noted. Patient appears chronically ill looking, very deconditioned, alert, and following commands. She denies any further blood in the phlegm. Heparin resumed. She has tolerated mechanical soft diet.  Objective: Vitals:   10/14/22 0034 10/14/22 0604 10/14/22 0754  10/14/22 1209  BP: 125/65 128/61 139/64 (!) 121/51  Pulse: 82 70 74 67  Resp: '18 20 18 18  '$ Temp: 98.4 F (36.9 C) 98 F (36.7 C) 98 F (36.7 C) 98.4 F (36.9 C)  TempSrc: Oral  Oral   SpO2: 94% 100% 98% 98%  Weight:      Height:        Intake/Output Summary (Last 24 hours) at 10/14/2022 1407 Last data filed at 10/13/2022 2230 Gross per 24 hour  Intake --  Output 300 ml  Net -300 ml   Filed Weights   10/07/22 1800 10/08/22 0037  Weight: 84.9 kg 80.4 kg    Examination:  General exam: Chronically ill looking, very deconditioned, appears comfortable, not in any distress. Respiratory system: CTA bilaterally, respiratory effort normal, RR 16. Cardiovascular system: S1 & S2 heard, regular rate and rhythm, no murmur. Gastrointestinal system: Abdomen is soft, non tender, non distended, BS+ Central nervous system: Alert and oriented x 2, no focal neurological deficits. Extremities: No edema, no cyanosis, no clubbing Skin: No rashes, lesions or ulcers Psychiatry: Mood & affect appropriate.     Data Reviewed:  I have personally reviewed following labs and imaging studies  CBC: Recent Labs  Lab 10/10/22 0101 10/11/22 0335 10/12/22 0446 10/13/22 0330 10/14/22 0521  WBC 25.5* 16.4* 20.6* 18.4* 18.2*  NEUTROABS 20.6* 12.9* 16.2* 13.6* 11.9*  HGB 10.1* 9.6* 9.4* 9.5* 9.9*  HCT 30.7* 28.8* 28.9* 29.1* 30.8*  MCV 99.7 96.6 98.0 99.3 102.7*  PLT 86* 85* 119* 142* 923   Basic Metabolic Panel: Recent Labs  Lab 10/09/22 0742 10/09/22 1828 10/10/22 0101 10/10/22 0705 10/10/22 2035 10/11/22 0335 10/12/22 0446 10/13/22 0330 10/14/22 0521  NA 138  --  139  --   --  141 142  --  141  K 2.8*   < > 3.3*  3.5 3.4* 3.6 3.5 3.3*  --  4.3  CL 107  --  108  --   --  111 111  --  110  CO2 24  --  27  --   --  25 24  --  27  GLUCOSE 154*  --  87  --   --  115* 119*  --  113*  BUN 23  --  23  --   --  23 16  --  13  CREATININE 0.75  --  0.69  --   --  0.58 0.54  --  0.53   CALCIUM 7.5*  --  7.3*  --   --  7.4* 7.2*  --  7.7*  MG 2.0  --  2.1  --   --  2.2 2.2 2.2 2.0  PHOS  --    < > 2.2*  3.0 2.0* 1.6* 3.7 2.2* 1.8* 2.8   < > = values in this interval not displayed.   GFR: Estimated Creatinine Clearance: 52.8 mL/min (by C-G formula based on SCr of 0.53 mg/dL). Liver Function Tests: Recent Labs  Lab 10/07/22 1501 10/07/22 2223  AST 56* 43*  ALT 8 8  ALKPHOS 111 110  BILITOT 1.6* 1.7*  PROT 8.1 4.9*  ALBUMIN 4.9 2.7*   Recent Labs  Lab 10/07/22 1501  LIPASE 48   No results for input(s): "AMMONIA" in the last 168 hours. Coagulation Profile: Recent Labs  Lab 10/08/22 1021  INR 1.5*   Cardiac Enzymes: No results for input(s): "CKTOTAL", "CKMB", "CKMBINDEX", "TROPONINI" in the last 168 hours. BNP (last 3 results) No results for input(s): "PROBNP" in the last 8760 hours. HbA1C: No results for input(s): "HGBA1C" in the last 72 hours. CBG: Recent Labs  Lab 10/13/22 2015 10/14/22 0035 10/14/22 0536 10/14/22 0750 10/14/22 1206  GLUCAP 93 130* 86 102* 178*   Lipid Profile: No results for input(s): "CHOL", "HDL", "LDLCALC", "TRIG", "CHOLHDL", "LDLDIRECT" in the last 72 hours. Thyroid Function Tests: No results for input(s): "TSH", "T4TOTAL", "FREET4", "T3FREE", "THYROIDAB" in the last 72 hours. Anemia Panel: No results for input(s): "VITAMINB12", "FOLATE", "FERRITIN", "TIBC", "IRON", "RETICCTPCT" in the last 72 hours. Sepsis Labs: Recent Labs  Lab 10/07/22 1514 10/07/22 1700 10/07/22 2037 10/08/22 0115 10/08/22 0302 10/08/22 0558  PROCALCITON 0.12  --   --   --   --   --   LATICACIDVEN  --    < > 3.3* 3.5* 2.3* 2.4*   < > = values in this interval not displayed.    Recent Results (from the past 240 hour(s))  Resp Panel by RT-PCR (Flu A&B, Covid) Anterior Nasal Swab     Status: None   Collection Time: 10/07/22  2:08 PM   Specimen: Anterior Nasal Swab  Result Value Ref  Range Status   SARS Coronavirus 2 by RT PCR NEGATIVE  NEGATIVE Final    Comment: (NOTE) SARS-CoV-2 target nucleic acids are NOT DETECTED.  The SARS-CoV-2 RNA is generally detectable in upper respiratory specimens during the acute phase of infection. The lowest concentration of SARS-CoV-2 viral copies this assay can detect is 138 copies/mL. A negative result does not preclude SARS-Cov-2 infection and should not be used as the sole basis for treatment or other patient management decisions. A negative result may occur with  improper specimen collection/handling, submission of specimen other than nasopharyngeal swab, presence of viral mutation(s) within the areas targeted by this assay, and inadequate number of viral copies(<138 copies/mL). A negative result must be combined with clinical observations, patient history, and epidemiological information. The expected result is Negative.  Fact Sheet for Patients:  EntrepreneurPulse.com.au  Fact Sheet for Healthcare Providers:  IncredibleEmployment.be  This test is no t yet approved or cleared by the [place] FDA and  has been authorized for detection and/or diagnosis of SARS-CoV-2 by FDA under an Emergency Use Authorization (EUA). This EUA will remain  in effect (meaning this test can be used) for the duration of the COVID-19 declaration under Section 564(b)(1) of the Act, 21 U.S.C.section 360bbb-3(b)(1), unless the authorization is terminated  or revoked sooner.       Influenza A by PCR NEGATIVE NEGATIVE Final   Influenza B by PCR NEGATIVE NEGATIVE Final    Comment: (NOTE) The Xpert Xpress SARS-CoV-2/FLU/RSV plus assay is intended as an aid in the diagnosis of influenza from Nasopharyngeal swab specimens and should not be used as a sole basis for treatment. Nasal washings and aspirates are unacceptable for Xpert Xpress SARS-CoV-2/FLU/RSV testing.  Fact Sheet for Patients: EntrepreneurPulse.com.au  Fact Sheet for Healthcare  Providers: IncredibleEmployment.be  This test is not yet approved or cleared by the [place] FDA and has been authorized for detection and/or diagnosis of SARS-CoV-2 by FDA under an Emergency Use Authorization (EUA). This EUA will remain in effect (meaning this test can be used) for the duration of the COVID-19 declaration under Section 564(b)(1) of the Act, 21 U.S.C. section 360bbb-3(b)(1), unless the authorization is terminated or revoked.  Performed at [facility], [address] [place], [place] [address]   Culture, blood (routine x 2)     Status: None   Collection Time: 10/07/22  4:00 PM   Specimen: BLOOD  Result Value Ref Range Status   Specimen Description BLOOD BLOOD LEFT ARM  Final   Special Requests   Final    BOTTLES DRAWN AEROBIC AND ANAEROBIC Blood Culture adequate volume   Culture   Final    NO GROWTH 5 DAYS Performed at [facility], [address] [place], [place] [address]    Report Status 10/12/2022 FINAL  Final  Urine Culture     Status: Abnormal   Collection Time: 10/07/22  4:45 PM   Specimen: Urine, Random  Result Value Ref Range Status   Specimen Description   Final    URINE, RANDOM Performed at [facility], [address] [place], [place] [address]    Special Requests   Final    NONE Performed at [facility], [address] [place], [place] [address]    Culture 30,000 COLONIES/mL ESCHERICHIA COLI (A)  Final   Report Status 10/10/2022 FINAL  Final   Organism ID, Bacteria ESCHERICHIA COLI (A)  Final      Susceptibility   Escherichia coli - MIC*  AMPICILLIN 4 SENSITIVE Sensitive     CEFAZOLIN <=4 SENSITIVE Sensitive     CEFEPIME <=0.12 SENSITIVE Sensitive     CEFTRIAXONE <=0.25 SENSITIVE Sensitive     CIPROFLOXACIN <=0.25 SENSITIVE Sensitive     GENTAMICIN <=1 SENSITIVE Sensitive     IMIPENEM <=0.25 SENSITIVE Sensitive     NITROFURANTOIN <=16 SENSITIVE Sensitive      TRIMETH/SULFA <=20 SENSITIVE Sensitive     AMPICILLIN/SULBACTAM <=2 SENSITIVE Sensitive     PIP/TAZO <=4 SENSITIVE Sensitive     * 30,000 COLONIES/mL ESCHERICHIA COLI  Culture, blood (routine x 2)     Status: None   Collection Time: 10/07/22  8:37 PM   Specimen: BLOOD RIGHT HAND  Result Value Ref Range Status   Specimen Description BLOOD RIGHT HAND  Final   Special Requests   Final    BOTTLES DRAWN AEROBIC ONLY Blood Culture adequate volume   Culture   Final    NO GROWTH 5 DAYS Performed at [facility], [address], [place], [place] [address]    Report Status 10/12/2022 FINAL  Final  MRSA Next Gen by PCR, Nasal     Status: None   Collection Time: 10/08/22 12:35 AM   Specimen: Nasal Mucosa; Nasal Swab  Result Value Ref Range Status   MRSA by PCR Next Gen NOT DETECTED NOT DETECTED Final    Comment: (NOTE) The GeneXpert MRSA Assay (FDA approved for NASAL specimens only), is one component of a comprehensive MRSA colonization surveillance program. It is not intended to diagnose MRSA infection nor to guide or monitor treatment for MRSA infections. Test performance is not FDA approved in patients less than 40 years old. Performed at [facility], [address], [place], [place] [address]     Radiology Studies: No results found.  Scheduled Meds:  aspirin EC  81 mg Oral Daily   atorvastatin  20 mg Oral QHS   carbidopa-levodopa  2 tablet Oral TID   carbidopa-levodopa  2 tablet Oral QHS   And   entacapone  200 mg Oral QHS   donepezil  10 mg Oral QHS   DULoxetine  60 mg Oral Daily   famotidine  20 mg Oral QHS   folic acid  1 mg Oral Daily   [START ON 10/15/2022] hydrocortisone sod succinate (SOLU-CORTEF) inj  50 mg Intravenous Daily   melatonin  5 mg Oral QHS   memantine  10 mg Oral BID   metoprolol tartrate  25 mg Oral BID   multivitamin with minerals  1 tablet Oral Daily   nystatin  5 mL Oral QID   phosphorus  250 mg Oral TID   pregabalin  150 mg  Oral QHS   vitamin E  400 Units Oral Daily   Continuous Infusions:  sodium chloride 250 mL (10/13/22 1253)   heparin 1,100 Units/hr (10/14/22 1025)   levofloxacin (LEVAQUIN) IV 750 mg (10/14/22 1006)     LOS: 7 days    Time spent: 50 mins    Markala Sitts, MD [facility]   If 7PM-7AM, please contact night-coverage

## 2022-10-15 DIAGNOSIS — R6520 Severe sepsis without septic shock: Secondary | ICD-10-CM | POA: Diagnosis not present

## 2022-10-15 DIAGNOSIS — A419 Sepsis, unspecified organism: Secondary | ICD-10-CM | POA: Diagnosis not present

## 2022-10-15 LAB — CBC WITH DIFFERENTIAL/PLATELET
Abs Immature Granulocytes: 1.22 10*3/uL — ABNORMAL HIGH (ref 0.00–0.07)
Basophils Absolute: 0.1 10*3/uL (ref 0.0–0.1)
Basophils Relative: 1 %
Eosinophils Absolute: 0.6 10*3/uL — ABNORMAL HIGH (ref 0.0–0.5)
Eosinophils Relative: 4 %
HCT: 29.9 % — ABNORMAL LOW (ref 36.0–46.0)
Hemoglobin: 9.7 g/dL — ABNORMAL LOW (ref 12.0–15.0)
Immature Granulocytes: 8 %
Lymphocytes Relative: 18 %
Lymphs Abs: 2.9 10*3/uL (ref 0.7–4.0)
MCH: 32.2 pg (ref 26.0–34.0)
MCHC: 32.4 g/dL (ref 30.0–36.0)
MCV: 99.3 fL (ref 80.0–100.0)
Monocytes Absolute: 1.7 10*3/uL — ABNORMAL HIGH (ref 0.1–1.0)
Monocytes Relative: 11 %
Neutro Abs: 9.8 10*3/uL — ABNORMAL HIGH (ref 1.7–7.7)
Neutrophils Relative %: 58 %
Platelets: 220 10*3/uL (ref 150–400)
RBC: 3.01 MIL/uL — ABNORMAL LOW (ref 3.87–5.11)
RDW: 14.4 % (ref 11.5–15.5)
Smear Review: NORMAL
WBC: 16.2 10*3/uL — ABNORMAL HIGH (ref 4.0–10.5)
nRBC: 0.2 % (ref 0.0–0.2)

## 2022-10-15 LAB — GLUCOSE, CAPILLARY
Glucose-Capillary: 107 mg/dL — ABNORMAL HIGH (ref 70–99)
Glucose-Capillary: 71 mg/dL (ref 70–99)

## 2022-10-15 LAB — HEPARIN LEVEL (UNFRACTIONATED)
Heparin Unfractionated: 0.58 IU/mL (ref 0.30–0.70)
Heparin Unfractionated: <0.1 IU/mL — ABNORMAL LOW (ref 0.30–0.70)

## 2022-10-15 MED ORDER — DIAZEPAM 5 MG/ML IJ SOLN
1.0000 mg | Freq: Once | INTRAMUSCULAR | Status: AC
  Administered 2022-10-15: 1 mg via INTRAVENOUS
  Filled 2022-10-15: qty 2

## 2022-10-15 MED ORDER — TRAZODONE HCL 50 MG PO TABS: 50.0000 mg | ORAL_TABLET | Freq: Once | ORAL | Status: DC

## 2022-10-15 NOTE — Progress Notes (Signed)
ANTICOAGULATION CONSULT NOTE  Pharmacy Consult for Heparin Infusion Indication: pulmonary embolus  Patient Measurements: Height: '5\' 4"'$  (162.6 cm) Weight: 80.4 kg (177 lb 4 oz) IBW/kg (Calculated) : 54.7 Heparin Dosing Weight: 72 kg  Labs: Recent Labs    10/13/22 0330 10/13/22 2120 10/14/22 0521 10/14/22 1412 10/15/22 0815  HGB 9.5*  --  9.9*  --  9.7*  HCT 29.1*  --  30.8*  --  29.9*  PLT 142*  --  177  --  220  HEPARINUNFRC  --    < > 0.52 0.48 0.58  CREATININE  --   --  0.53  --   --    < > = values in this interval not displayed.     Estimated Creatinine Clearance: 52.8 mL/min (by C-G formula based on SCr of 0.53 mg/dL).   Medical History: Past Medical History:  Diagnosis Date   Arthritis    GERD (gastroesophageal reflux disease)    Hyperlipidemia    Parkinson disease    Restless leg syndrome    Medications: Patient was on heparin infusion 10/22 - 10/26 but it was discontinued secondary to hemoptysis  Assessment: Pt is a 88 yo female admitted w/ possible pneumonia & acute colitis, now found w/ "tiny acute pulmonary emboli within the lower lobes bilaterally." CBC stable.   10/28 14:12 HL 0.48  10/29 0815 HL 0.58  Goal of Therapy:  Heparin level 0.3 - 0.5 units/ml (no bolus protocol) Monitor platelets by anticoagulation protocol: Yes  Plan:  Heparin level is slightly supratherapeutic. Will decrease heparin infusion 1050 units/hr. Recheck heparin level in 8 hours. CBC daily while on heparin.    Oswald Hillock, PharmD 10/15/2022 8:40 AM

## 2022-10-15 NOTE — Progress Notes (Signed)
Pt remains restless and agitated. Pt unable to be reoriented. Nursing staff has continually been at patient's bedside x3 hours due to patient's restlessness and pulling at tubes and lines. PO Trazodone and IV Valium ineffective. Hospitalist made aware and safety observation ordered.

## 2022-10-15 NOTE — Progress Notes (Signed)
Pt laying in bed, pt noted to have pulled PIV access x3 this shift. PIV access established by this RN. Patient restless, anxious, and agitated. Pt continues to pull at lines and tubes despite multiple attempts at redirection. Patient increasingly agitated during attempts at redirection and reorientation. Hospitalist notified. SEE ORDERS.

## 2022-10-15 NOTE — TOC Progression Note (Addendum)
Transition of Care [facility]) - Progression Note    Patient Details  Name: Morgan Dennis MRN: [id number] Date of Birth: 19-Sep-1934  Transition of Care [facility] 2) CM/SW [place], LCSW Phone Number: 10/15/2022, 1:18 PM  Clinical Narrative:    SNF recommended. Workup started. PASRR PENDING. Per RNCM note, family prefers [place], [place], or [place]. TOC to follow for bed offers.   2:31- Uploaded info to  Must. PASRR STILL PENDING. TOC handoff updated.    Expected Discharge Plan: [place] Barriers to Discharge: Continued Medical Work up  Expected Discharge Plan and Services Expected Discharge Plan: [place]   Discharge Planning Services: CM Consult Post Acute Care Choice: [place] Living arrangements for the past 2 months: [place]                 DME Arranged: N/A DME Agency: NA                   Social Determinants of Health (SDOH) Interventions    Readmission Risk Interventions     No data to display

## 2022-10-15 NOTE — NC FL2 (Signed)
[place] LEVEL OF CARE SCREENING TOOL     IDENTIFICATION  Patient Name: Morgan Dennis Birthdate: [birth year] Sex: female Admission Date (Current Location): 10/07/2022  [place] and [place] Number:  Engineering geologist and Address:  [facility], [address] [place], [place] [address]      Provider Number: [id number]  Attending Physician Name and Address:  Shawna Clamp, MD  Relative Name and Phone Number:  Shanquita, Ronning [phone])   [phone] [facility])    Current Level of Care: Hospital Recommended Level of Care: [place] Prior Approval Number:    Date Approved/Denied:   PASRR Number: PENDING  Discharge Plan:      Current Diagnoses: Patient Active Problem List   Diagnosis Date Noted   HCAP (healthcare-associated pneumonia) 10/07/2022   Severe sepsis ([place]) 10/07/2022   Acute colitis 10/07/2022   Depression with anxiety 10/07/2022   Myocardial injury 10/07/2022   AKI (acute kidney injury) ([place]) 10/07/2022   Syncope 10/07/2022   Severe sepsis with acute organ dysfunction ([place]) 10/07/2022   Acute hypoxemic respiratory failure due to COVID-19 [facility]) 08/17/2022   Hypokalemia 08/17/2022   Essential hypertension 08/17/2022   Dyslipidemia 08/17/2022   Hearing deficit 05/04/2021   Acute respiratory failure due to COVID-19 ([place]) 12/10/2019   Acute on chronic respiratory failure with hypoxia ([place]) 10/19/2019   Thrombocytopenia ([place]) 10/19/2019   Dementia without behavioral disturbance ([place]) 10/19/2019   Aortic atherosclerosis ([place]) 10/19/2019   Right upper lobe pneumonia 10/17/2019   Sepsis ([place]) 06/16/2019   Parkinson's disease 04/23/2017   Skin cancer 02/12/2017   Chronic bronchitis ([place]) 01/02/2017   Frequency of urination and polyuria 10/12/2015   Upper GI bleed 08/18/2015   Adaptation reaction 04/30/2015   Malaise and fatigue 04/30/2015   Affective disorder, major 04/30/2015   Bad memory 04/30/2015   Mild  cognitive disorder 04/30/2015   Fungal infection of toenail 04/30/2015   Arthritis, degenerative 04/30/2015   OP (osteoporosis) 04/30/2015   Arthritis or polyarthritis, rheumatoid ([place]) 04/30/2015   Personal history of other malignant neoplasm of skin 11/16/2014   Avitaminosis D 05/12/2014   Trigger finger 05/12/2014   Rheumatoid arthritis ([place]) 03/17/2014   Osteoporosis, post-menopausal 03/17/2014    Orientation RESPIRATION BLADDER Height & Weight     Self  O2 Incontinent, External catheter Weight: 177 lb 4 oz (80.4 kg) Height:  '5\' 4"'$  (162.6 cm)  BEHAVIORAL SYMPTOMS/MOOD NEUROLOGICAL BOWEL NUTRITION STATUS      Incontinent Diet (dys 3)  AMBULATORY STATUS COMMUNICATION OF NEEDS Skin   Limited Assist   Bruising                       Personal Care Assistance Level of Assistance  Bathing, Feeding, Dressing Bathing Assistance: Limited assistance Feeding assistance: Limited assistance Dressing Assistance: Limited assistance     Functional Limitations Info             SPECIAL CARE FACTORS FREQUENCY  PT (By licensed PT), OT (By licensed OT)     PT Frequency: 5 times per week OT Frequency: 5 times per week            Contractures      Additional Factors Info  Code Status, Allergies Code Status Info: DNR Allergies Info: [place], Noy (Son)   [phone] [facility])           Current Medications (10/15/2022):  This is the current hospital active medication list Current Facility-Administered Medications  Medication Dose Route Frequency Provider Last  Rate Last Admin   0.9 %  sodium chloride infusion  250 mL Intravenous Continuous Lang Snow, NP 10 mL/hr at 10/13/22 1253 250 mL at 10/13/22 1253   acetaminophen (TYLENOL) tablet 650 mg  650 mg Oral Q6H PRN Ivor Costa, MD   650 mg at 10/11/22 0745   albuterol (PROVENTIL) (2.5 MG/3ML) 0.083% nebulizer solution 2.5 mg  2.5 mg Nebulization Q4H PRN Ivor Costa, MD       aspirin EC tablet 81 mg  81 mg Oral  Daily Ivor Costa, MD   81 mg at 10/15/22 0855   atorvastatin (LIPITOR) tablet 20 mg  20 mg Oral QHS Ivor Costa, MD   20 mg at 10/14/22 2013   carbidopa-levodopa (SINEMET IR) 25-100 MG per tablet immediate release 2 tablet  2 tablet Oral TID Armando Reichert, MD   2 tablet at 10/15/22 0855   carbidopa-levodopa (SINEMET IR) 25-100 MG per tablet immediate release 2 tablet  2 tablet Oral QHS Armando Reichert, MD   2 tablet at 10/14/22 2012   And   entacapone (COMTAN) tablet 200 mg  200 mg Oral QHS Dgayli, Berdine Addison, MD   200 mg at 10/14/22 2012   docusate sodium (COLACE) capsule 100 mg  100 mg Oral Daily PRN Ivor Costa, MD       donepezil (ARICEPT) tablet 10 mg  10 mg Oral QHS Dgayli, Berdine Addison, MD   10 mg at 10/14/22 2012   DULoxetine (CYMBALTA) DR capsule 60 mg  60 mg Oral Daily Dgayli, Berdine Addison, MD   60 mg at 10/15/22 0855   famotidine (PEPCID) tablet 20 mg  20 mg Oral QHS Dorothe Pea, RPH   20 mg at 44/03/47 4259   folic acid (FOLVITE) tablet 1 mg  1 mg Oral Daily Ivor Costa, MD   1 mg at 10/15/22 5638   hydrocerin (EUCERIN) cream 1 Application  1 Application Topical Daily PRN Ivor Costa, MD       HYDROcodone-acetaminophen (NORCO) 7.5-325 MG per tablet 1 tablet  1 tablet Oral Q6H PRN Ivor Costa, MD   1 tablet at 10/14/22 1737   hydrocortisone sodium succinate (SOLU-CORTEF) 100 MG injection 50 mg  50 mg Intravenous Daily Shawna Clamp, MD   50 mg at 10/15/22 0853   levofloxacin (LEVAQUIN) IVPB 750 mg  750 mg Intravenous Daily Shawna Clamp, MD 100 mL/hr at 10/15/22 0846 750 mg at 10/15/22 0846   melatonin tablet 5 mg  5 mg Oral QHS Ivor Costa, MD   5 mg at 10/14/22 2012   memantine (NAMENDA) tablet 10 mg  10 mg Oral BID Armando Reichert, MD   10 mg at 10/15/22 0855   metoprolol tartrate (LOPRESSOR) tablet 25 mg  25 mg Oral BID Armando Reichert, MD   25 mg at 10/15/22 0855   multivitamin with minerals tablet 1 tablet  1 tablet Oral Daily Ivor Costa, MD   1 tablet at 10/15/22 0855   nystatin (MYCOSTATIN)  100000 UNIT/ML suspension 500,000 Units  5 mL Oral QID Armando Reichert, MD   500,000 Units at 10/15/22 0855   ondansetron (ZOFRAN) injection 4 mg  4 mg Intravenous Q8H PRN Ivor Costa, MD       Oral care mouth rinse  15 mL Mouth Rinse PRN Lang Snow, NP       pregabalin (LYRICA) capsule 150 mg  150 mg Oral QHS Dorothe Pea, RPH   150 mg at 10/14/22 2013   Vitamin E CAPS 400 Units  400 Units Oral  Daily Ivor Costa, MD   400 Units at 10/15/22 6967     Discharge Medications: Please see discharge summary for a list of discharge medications.  Relevant Imaging Results:  Relevant Lab Results:   Additional Information SS #: [id number]  [place], LCSW

## 2022-10-15 NOTE — Progress Notes (Signed)
PROGRESS NOTE    Morgan Dennis  OIN:[id number] DOB: [birth year] DOA: 10/07/2022  PCP: Jerrol Banana., MD    Brief Narrative: This 88 years old female with PMH significant for hypertension, hyperlipidemia, dementia, Parkinson disease, anxiety, depression, rheumatoid arthritis and recent COVID-19 infection presented in the ED from memory care center with chief complaint of syncope and hypotension.  Patient had witnessed syncopal episode in memory unit and was found to be hypotensive.  She was recently hospitalized and was treated with paxloid  and steroids for COVID-19 infection.  She was discharged on 2 L of supplemental oxygen.  Patient was found to be septic and started empirically on antibiotics due to suspected colitis and possible HCAP versus aspiration pneumonia.  Patient was hypotensive requiring pressor support. TRH pick up 10/11/22.  Assessment & Plan:   Principal Problem:   Severe sepsis ([place]) Active Problems:   Acute colitis   HCAP (healthcare-associated pneumonia)   Myocardial injury   AKI (acute kidney injury) ([place])   Essential hypertension   Parkinson's disease   Rheumatoid arthritis ([place])   Syncope   Dementia without behavioral disturbance ([place])   Depression with anxiety   Severe sepsis with acute organ dysfunction (HCC)  Acute metabolic encephalopathy > multifactorial. CT head no acute abnormality.  Avoid sedating medications. Could be due to dementia, Parkinson's disease. Continue Sinemet, Namenda and Aricept for dementia Mental status improved, seems at her baseline.  Acute on chronic hypoxic respiratory failure: Acute bilateral pulmonary embolism: Family does not want any aggressive intervention. Continue supplemental oxygen and wean as tolerated.  She weaned down to 5L of supplemental oxygen. Patient is on anticoagulation but DOAC will need to be discussed prior to discharge given risk of falls with her dementia and Parkinson's disease.   Initiated on  heparin infusion, discontinued 10/26 since patient coughed up blood. Heparin resumed.  Continue to monitor platelet count. Spoke with son Asyria Kolander in detail, given risk of falls with dementia and Parkinson's disease. He is not in favor of anti-coagulation, anti-coagulation discontinued.  Son is understandable,  states his father died because of ICH due to being on anticoagulation after fall.  AKI likely prerenal due to ATN. Renal functions improved.  Thrombocytopenia:> Resolved. Continue to monitor platelet count while on heparin. Platelets improved to 85> 119> 142>177>220  Septic shock secondary to Suspected colitis/ HCAP: Patient was hypotensive requiring Levophed support. She was started on stress dose steroids.  Septic shock resolved. Patient reports having abdominal pain. CT abdomen shows possible colitis. Initiated on broad-spectrum antibiotics aztreonam and metronidazole Blood and urine cultures no growth so far. Antibiotics discontinued after 6 days.  WBC trending down 18.2 Levaquin restarted given high WBC count and risk for aspiration. WBC trending down. Solucortef tapered off.  Goals of care discussion Discussed with son at bedside.  Explained in detail about the poor prognosis. Son states that he does not want her to be aggressively managed. If there is decline from current situation they are agreeable to transition to comfort care. Anticoagulation discontinued after long discussion about risks and benefit.   DVT prophylaxis:SCDs Code Status: DNR Family Communication: Spoke with son at bedside. Disposition Plan:   Status is: Inpatient Remains inpatient appropriate because: Admitted for acute metabolic encephalopathy and septic shock requiring Levophed support.  She is also found to have pulmonary embolism requiring anticoagulation.  Palliative care consulted to discuss goals of care.  PT/OT: Pending   Consultants:  PCCM  Procedures:  None  Antimicrobials: Anti-infectives (From admission, onward)  Start     Dose/Rate Route Frequency Ordered Stop   10/13/22 1230  levofloxacin (LEVAQUIN) IVPB 750 mg        750 mg 100 mL/hr over 90 Minutes Intravenous Daily 10/13/22 1138     10/10/22 0100  vancomycin (VANCOREADY) IVPB 750 mg/150 mL  Status:  Discontinued       See Hyperspace for full Linked Orders Report.   750 mg 150 mL/hr over 60 Minutes Intravenous Every 24 hours 10/09/22 0009 10/09/22 1017   10/09/22 0100  vancomycin (VANCOREADY) IVPB 1500 mg/300 mL       See Hyperspace for full Linked Orders Report.   1,500 mg 150 mL/hr over 120 Minutes Intravenous  Once 10/09/22 0009 10/09/22 0247   10/08/22 0400  metroNIDAZOLE (FLAGYL) IVPB 500 mg  Status:  Discontinued        500 mg 100 mL/hr over 60 Minutes Intravenous Every 12 hours 10/07/22 1823 10/12/22 1228   10/07/22 2200  aztreonam (AZACTAM) 1 g in sodium chloride 0.9 % 100 mL IVPB  Status:  Discontinued        1 g 200 mL/hr over 30 Minutes Intravenous Every 8 hours 10/07/22 1859 10/12/22 1228   10/07/22 1902  vancomycin variable dose per unstable renal function (pharmacist dosing)  Status:  Discontinued         Does not apply See admin instructions 10/07/22 1906 10/09/22 0012   10/07/22 1845  vancomycin (VANCOREADY) IVPB 2000 mg/400 mL        2,000 mg 200 mL/hr over 120 Minutes Intravenous  Once 10/07/22 1841 10/07/22 2358   10/07/22 1615  aztreonam (AZACTAM) 2 g in sodium chloride 0.9 % 100 mL IVPB  Status:  Discontinued        2 g 200 mL/hr over 30 Minutes Intravenous Every 8 hours 10/07/22 1603 10/07/22 1859   10/07/22 1615  vancomycin (VANCOCIN) IVPB 1000 mg/200 mL premix  Status:  Discontinued        1,000 mg 200 mL/hr over 60 Minutes Intravenous  Once 10/07/22 1603 10/07/22 1841   10/07/22 1615  metroNIDAZOLE (FLAGYL) IVPB 500 mg        500 mg 100 mL/hr over 60 Minutes Intravenous  Once 10/07/22 1603 10/07/22 1848       Subjective: Patient was seen and  examined at bedside.  Overnight events noted. Patient appears chronically ill looking, very deconditioned, alert and following commands. She was agitated and restless,  trying to get out of bed last night,  Could be sundowning. She denies any further blood in the phlegm.  Heparin discontinued. She has tolerated mechanical soft diet.  Objective: Vitals:   10/14/22 2017 10/14/22 2018 10/15/22 0020 10/15/22 0844  BP: (!) 153/66 (!) 153/66 129/67 130/66  Pulse: 73 73 67 72  Resp:  '20 18 17  '$ Temp:  98 F (36.7 C) 98 F (36.7 C) 97.8 F (36.6 C)  TempSrc:  Oral Oral   SpO2:  100% 100% 91%  Weight:      Height:        Intake/Output Summary (Last 24 hours) at 10/15/2022 1021 Last data filed at 10/15/2022 0846 Gross per 24 hour  Intake 120 ml  Output 1700 ml  Net -1580 ml   Filed Weights   10/07/22 1800 10/08/22 0037  Weight: 84.9 kg 80.4 kg    Examination:  General exam: Appears comfortable, chronically ill looking, very deconditioned, not in any distress. Respiratory system: CTA bilaterally, respiratory effort normal, RR 15 Cardiovascular system: S1 &  S2 heard, regular rate rhythm, no murmur. Gastrointestinal system: Abdomen is soft, non tender, non distended, BS+ Central nervous system: Alert and oriented x2, no focal neurological deficits. Extremities: No edema, no cyanosis, no clubbing Skin: No rashes, lesions or ulcers Psychiatry: Mood & affect appropriate.     Data Reviewed: I have personally reviewed following labs and imaging studies  CBC: Recent Labs  Lab 10/11/22 0335 10/12/22 0446 10/13/22 0330 10/14/22 0521 10/15/22 0815  WBC 16.4* 20.6* 18.4* 18.2* 16.2*  NEUTROABS 12.9* 16.2* 13.6* 11.9* 9.8*  HGB 9.6* 9.4* 9.5* 9.9* 9.7*  HCT 28.8* 28.9* 29.1* 30.8* 29.9*  MCV 96.6 98.0 99.3 102.7* 99.3  PLT 85* 119* 142* 177 419   Basic Metabolic Panel: Recent Labs  Lab 10/09/22 0742 10/09/22 1828 10/10/22 0101 10/10/22 0705 10/10/22 2035 10/11/22 0335  10/12/22 0446 10/13/22 0330 10/14/22 0521  NA 138  --  139  --   --  141 142  --  141  K 2.8*   < > 3.3*  3.5 3.4* 3.6 3.5 3.3*  --  4.3  CL 107  --  108  --   --  111 111  --  110  CO2 24  --  27  --   --  25 24  --  27  GLUCOSE 154*  --  87  --   --  115* 119*  --  113*  BUN 23  --  23  --   --  23 16  --  13  CREATININE 0.75  --  0.69  --   --  0.58 0.54  --  0.53  CALCIUM 7.5*  --  7.3*  --   --  7.4* 7.2*  --  7.7*  MG 2.0  --  2.1  --   --  2.2 2.2 2.2 2.0  PHOS  --    < > 2.2*  3.0 2.0* 1.6* 3.7 2.2* 1.8* 2.8   < > = values in this interval not displayed.   GFR: Estimated Creatinine Clearance: 52.8 mL/min (by C-G formula based on SCr of 0.53 mg/dL). Liver Function Tests: No results for input(s): "AST", "ALT", "ALKPHOS", "BILITOT", "PROT", "ALBUMIN" in the last 168 hours.  No results for input(s): "LIPASE", "AMYLASE" in the last 168 hours.  No results for input(s): "AMMONIA" in the last 168 hours. Coagulation Profile: No results for input(s): "INR", "PROTIME" in the last 168 hours. Cardiac Enzymes: No results for input(s): "CKTOTAL", "CKMB", "CKMBINDEX", "TROPONINI" in the last 168 hours. BNP (last 3 results) No results for input(s): "PROBNP" in the last 8760 hours. HbA1C: No results for input(s): "HGBA1C" in the last 72 hours. CBG: Recent Labs  Lab 10/14/22 1206 10/14/22 1601 10/14/22 2027 10/15/22 0035 10/15/22 0847  GLUCAP 178* 118* 94 107* 71   Lipid Profile: No results for input(s): "CHOL", "HDL", "LDLCALC", "TRIG", "CHOLHDL", "LDLDIRECT" in the last 72 hours. Thyroid Function Tests: No results for input(s): "TSH", "T4TOTAL", "FREET4", "T3FREE", "THYROIDAB" in the last 72 hours. Anemia Panel: No results for input(s): "VITAMINB12", "FOLATE", "FERRITIN", "TIBC", "IRON", "RETICCTPCT" in the last 72 hours. Sepsis Labs: No results for input(s): "PROCALCITON", "LATICACIDVEN" in the last 168 hours.   Recent Results (from the past 240 hour(s))  Resp Panel by  RT-PCR (Flu A&B, Covid) Anterior Nasal Swab     Status: None   Collection Time: 10/07/22  2:08 PM   Specimen: Anterior Nasal Swab  Result Value Ref Range Status   SARS Coronavirus 2 by RT PCR NEGATIVE NEGATIVE  Final    Comment: (NOTE) SARS-CoV-2 target nucleic acids are NOT DETECTED.  The SARS-CoV-2 RNA is generally detectable in upper respiratory specimens during the acute phase of infection. The lowest concentration of SARS-CoV-2 viral copies this assay can detect is 138 copies/mL. A negative result does not preclude SARS-Cov-2 infection and should not be used as the sole basis for treatment or other patient management decisions. A negative result may occur with  improper specimen collection/handling, submission of specimen other than nasopharyngeal swab, presence of viral mutation(s) within the areas targeted by this assay, and inadequate number of viral copies(<138 copies/mL). A negative result must be combined with clinical observations, patient history, and epidemiological information. The expected result is Negative.  Fact Sheet for Patients:  EntrepreneurPulse.com.au  Fact Sheet for Healthcare Providers:  IncredibleEmployment.be  This test is no t yet approved or cleared by the [place] FDA and  has been authorized for detection and/or diagnosis of SARS-CoV-2 by FDA under an Emergency Use Authorization (EUA). This EUA will remain  in effect (meaning this test can be used) for the duration of the COVID-19 declaration under Section 564(b)(1) of the Act, 21 U.S.C.section 360bbb-3(b)(1), unless the authorization is terminated  or revoked sooner.       Influenza A by PCR NEGATIVE NEGATIVE Final   Influenza B by PCR NEGATIVE NEGATIVE Final    Comment: (NOTE) The Xpert Xpress SARS-CoV-2/FLU/RSV plus assay is intended as an aid in the diagnosis of influenza from Nasopharyngeal swab specimens and should not be used as a sole basis for  treatment. Nasal washings and aspirates are unacceptable for Xpert Xpress SARS-CoV-2/FLU/RSV testing.  Fact Sheet for Patients: EntrepreneurPulse.com.au  Fact Sheet for Healthcare Providers: IncredibleEmployment.be  This test is not yet approved or cleared by the [place] FDA and has been authorized for detection and/or diagnosis of SARS-CoV-2 by FDA under an Emergency Use Authorization (EUA). This EUA will remain in effect (meaning this test can be used) for the duration of the COVID-19 declaration under Section 564(b)(1) of the Act, 21 U.S.C. section 360bbb-3(b)(1), unless the authorization is terminated or revoked.  Performed at [facility], [address] [place], [place] [address]   Culture, blood (routine x 2)     Status: None   Collection Time: 10/07/22  4:00 PM   Specimen: BLOOD  Result Value Ref Range Status   Specimen Description BLOOD BLOOD LEFT ARM  Final   Special Requests   Final    BOTTLES DRAWN AEROBIC AND ANAEROBIC Blood Culture adequate volume   Culture   Final    NO GROWTH 5 DAYS Performed at [facility], [address], [place], [place] [address]    Report Status 10/12/2022 FINAL  Final  Urine Culture     Status: Abnormal   Collection Time: 10/07/22  4:45 PM   Specimen: Urine, Random  Result Value Ref Range Status   Specimen Description   Final    URINE, RANDOM Performed at [facility], [address] [place], [place] [address]    Special Requests   Final    NONE Performed at [facility], [address] [place], [place] [address]    Culture 30,000 COLONIES/mL ESCHERICHIA COLI (A)  Final   Report Status 10/10/2022 FINAL  Final   Organism ID, Bacteria ESCHERICHIA COLI (A)  Final      Susceptibility   Escherichia coli - MIC*    AMPICILLIN 4 SENSITIVE Sensitive     CEFAZOLIN <=4 SENSITIVE Sensitive  CEFEPIME <=0.12 SENSITIVE Sensitive     CEFTRIAXONE <=0.25  SENSITIVE Sensitive     CIPROFLOXACIN <=0.25 SENSITIVE Sensitive     GENTAMICIN <=1 SENSITIVE Sensitive     IMIPENEM <=0.25 SENSITIVE Sensitive     NITROFURANTOIN <=16 SENSITIVE Sensitive     TRIMETH/SULFA <=20 SENSITIVE Sensitive     AMPICILLIN/SULBACTAM <=2 SENSITIVE Sensitive     PIP/TAZO <=4 SENSITIVE Sensitive     * 30,000 COLONIES/mL ESCHERICHIA COLI  Culture, blood (routine x 2)     Status: None   Collection Time: 10/07/22  8:37 PM   Specimen: BLOOD RIGHT HAND  Result Value Ref Range Status   Specimen Description BLOOD RIGHT HAND  Final   Special Requests   Final    BOTTLES DRAWN AEROBIC ONLY Blood Culture adequate volume   Culture   Final    NO GROWTH 5 DAYS Performed at [facility], [address] [place], [place] [address]    Report Status 10/12/2022 FINAL  Final  MRSA Next Gen by PCR, Nasal     Status: None   Collection Time: 10/08/22 12:35 AM   Specimen: Nasal Mucosa; Nasal Swab  Result Value Ref Range Status   MRSA by PCR Next Gen NOT DETECTED NOT DETECTED Final    Comment: (NOTE) The GeneXpert MRSA Assay (FDA approved for NASAL specimens only), is one component of a comprehensive MRSA colonization surveillance program. It is not intended to diagnose MRSA infection nor to guide or monitor treatment for MRSA infections. Test performance is not FDA approved in patients less than 43 years old. Performed at [facility], [address], [place], [place] [address]     Radiology Studies: No results found.  Scheduled Meds:  aspirin EC  81 mg Oral Daily   atorvastatin  20 mg Oral QHS   carbidopa-levodopa  2 tablet Oral TID   carbidopa-levodopa  2 tablet Oral QHS   And   entacapone  200 mg Oral QHS   donepezil  10 mg Oral QHS   DULoxetine  60 mg Oral Daily   famotidine  20 mg Oral QHS   folic acid  1 mg Oral Daily   hydrocortisone sod succinate (SOLU-CORTEF) inj  50 mg Intravenous Daily   melatonin  5 mg Oral QHS   memantine  10 mg Oral  BID   metoprolol tartrate  25 mg Oral BID   multivitamin with minerals  1 tablet Oral Daily   nystatin  5 mL Oral QID   pregabalin  150 mg Oral QHS   vitamin E  400 Units Oral Daily   Continuous Infusions:  sodium chloride 250 mL (10/13/22 1253)   levofloxacin (LEVAQUIN) IV 750 mg (10/15/22 0846)     LOS: 8 days    Time spent: 35 mins    Jamond Neels, MD [facility]   If 7PM-7AM, please contact night-coverage

## 2022-10-15 NOTE — Progress Notes (Signed)
RE: Morgan Dennis Date of Birth:  08/06/1934 Date:10/15/22   To Whom It May Concern:  Please be advised that the above-named patient will require a short-term nursing home stay - anticipated 30 days or less for rehabilitation and strengthening.  The plan is for return home.

## 2022-10-15 NOTE — TOC CM/SW Note (Signed)
RE: Morgan Dennis Date of Birth:  08/26/1934 Date:10/15/22   To Whom It May Concern:  Please be advised that the above-name patient's dementia diagnosis is primary and supersedes his mental illness.

## 2022-10-15 NOTE — Evaluation (Signed)
Occupational Therapy Evaluation Patient Details Name: Morgan Dennis MRN: [id number] DOB: 1934/01/04 Today's Date: 10/15/2022   History of Present Illness Pt is an 88 y/o female admitted from a memory care facility secondary to syncope, cough and abdominal pain. Pt found to have metabolic encephalopathy and septic shock. PMH including but not limited to hypertension, hyperlipidemia, GERD, depression with anxiety, Parkinson's disease, dementia, rheumatoid arthritis, recent admission due to COVID-19 infection on 2 L oxygen.   Clinical Impression   Patient seen for OT evaluation. Pt presenting with decreased independence in self care, balance, functional mobility/transfers, endurance, and safety awareness. Pt with cognitive deficits at baseline. No family/caregivers present during evaluation to provide any reliable information regarding home set up or PLOF. Per chart review, pt is from [facility] memory care facility ALF. Pt currently functioning at [place] for bed mobility, Min A for functional transfers using RW, set up-supervision for seated grooming tasks, and CGA-Min A to take several steps at EOB. Pt oriented to self only and required verbal cues for safety awareness. She remained on 4L O2 via [place] throughout mobility. Sitter present. Pt will benefit from acute OT to increase overall independence in the areas of ADLs and functional mobility in order to safely discharge to next venue of care. Upon hospital discharge, recommend STR to maximize pt safety and return to PLOF.     Recommendations for follow up therapy are one component of a multi-disciplinary discharge planning process, led by the attending physician.  Recommendations may be updated based on patient status, additional functional criteria and insurance authorization.   Follow Up Recommendations  Skilled nursing-short term rehab (<3 hours/day)    Assistance Recommended at Discharge Frequent or constant Supervision/Assistance  Patient can  return home with the following A lot of help with walking and/or transfers;A lot of help with bathing/dressing/bathroom;Assist for transportation;Help with stairs or ramp for entrance;Assistance with cooking/housework;Direct supervision/assist for medications management    Functional Status Assessment  Patient has had a recent decline in their functional status and demonstrates the ability to make significant improvements in function in a reasonable and predictable amount of time.  Equipment Recommendations  Other (comment) (defer to next venue of care)    Recommendations for Other Services       Precautions / Restrictions Precautions Precautions: Fall Precaution Comments: safety sitter Restrictions Weight Bearing Restrictions: No      Mobility Bed Mobility Overal bed mobility: Needs Assistance Bed Mobility: Supine to Sit, Sit to Supine     Supine to sit: HOB elevated, Min assist Sit to supine: Supervision   General bed mobility comments: VC for hand placement and to scoot hips forward at EOB; able to assist with scooting self up toward [facility]    Transfers Overall transfer level: Needs assistance Equipment used: 1 person hand held assist, Rolling walker (2 wheels) Transfers: Sit to/from Stand Sit to Stand: Min assist           General transfer comment: completed initially with HHA then provided RW for BUE support 2/2 being unsteady with good outcome      Balance Overall balance assessment: Needs assistance Sitting-balance support: Feet supported Sitting balance-Leahy Scale: Fair     Standing balance support: During functional activity, Single extremity supported, Bilateral upper extremity supported Standing balance-Leahy Scale: Poor                             ADL either performed or assessed with clinical judgement  ADL Overall ADL's : Needs assistance/impaired     Grooming: Wash/dry face;Brushing hair;Supervision/safety;Set up;Sitting                    Toilet Transfer: Minimal assistance;Rolling walker (2 wheels) Toilet Transfer Details (indicate cue type and reason): simulated with STS from EOB         Functional mobility during ADLs: Min guard;Rolling walker (2 wheels);Minimal assistance (to take ~2 steps forward/back at EOB, Min A for RW management)       Vision Patient Visual Report: No change from baseline       Perception     Praxis      Pertinent Vitals/Pain Pain Assessment Pain Assessment: No/denies pain     Hand Dominance     Extremity/Trunk Assessment Upper Extremity Assessment Upper Extremity Assessment: Difficult to assess due to impaired cognition;Generalized weakness   Lower Extremity Assessment Lower Extremity Assessment: Difficult to assess due to impaired cognition;Generalized weakness   Cervical / Trunk Assessment Cervical / Trunk Assessment: Kyphotic   Communication Communication Communication: No difficulties   Cognition Arousal/Alertness: Awake/alert Behavior During Therapy: Impulsive, WFL for tasks assessed/performed Overall Cognitive Status: History of cognitive impairments - at baseline Area of Impairment: Orientation, Attention, Memory, Following commands, Safety/judgement, Problem solving                 Orientation Level: Disoriented to, Time, Situation Current Attention Level: Selective Memory: Decreased short-term memory Following Commands: Follows one step commands inconsistently, Follows one step commands with increased time Safety/Judgement: Decreased awareness of safety, Decreased awareness of deficits   Problem Solving: Slow processing, Decreased initiation, Difficulty sequencing, Requires verbal cues, Requires tactile cues       General Comments  SpO2 maintained >90% on 4L O2 via [place]    Exercises Other Exercises Other Exercises: OT provided education re: role of OT, OT POC, post acute recs, sitting up for all meals, EOB/OOB mobility with assistance,  home/fall safety.     Shoulder Instructions      Home Living Family/patient expects to be discharged to:: Other (Comment)                                 Additional Comments: per chart review, pt living in a memory care facility      Prior Functioning/Environment Prior Level of Function : Patient poor historian/Family not available                        OT Problem List: Decreased strength;Decreased knowledge of use of DME or AE;Decreased activity tolerance;Impaired balance (sitting and/or standing);Decreased cognition;Decreased safety awareness;Cardiopulmonary status limiting activity      OT Treatment/Interventions: Self-care/ADL training;Therapeutic exercise;Patient/family education;Balance training;DME and/or AE instruction;Cognitive remediation/compensation;Therapeutic activities;Energy conservation    OT Goals(Current goals can be found in the care plan section) Acute Rehab OT Goals Patient Stated Goal: none stated OT Goal Formulation: Patient unable to participate in goal setting Time For Goal Achievement: 10/29/22 Potential to Achieve Goals: Fair ADL Goals Pt Will Perform Grooming: with min guard assist;standing Pt Will Perform Lower Body Dressing: with min assist;sit to/from stand Pt Will Transfer to Toilet: ambulating;bedside commode;with min guard assist Pt Will Perform Toileting - Clothing Manipulation and hygiene: with min assist;sit to/from stand  OT Frequency:      Co-evaluation              AM-PAC OT "6 Clicks" Daily Activity  Outcome Measure Help from another person eating meals?: A Little Help from another person taking care of personal grooming?: A Little Help from another person toileting, which includes using toliet, bedpan, or urinal?: A Lot Help from another person bathing (including washing, rinsing, drying)?: A Lot Help from another person to put on and taking off regular upper body clothing?: A Little Help from  another person to put on and taking off regular lower body clothing?: A Lot 6 Click Score: 15   End of Session Equipment Utilized During Treatment: Gait belt;Rolling walker (2 wheels);Oxygen (4L O2 via [place]) Nurse Communication: Mobility status  Activity Tolerance: Patient tolerated treatment well;Patient limited by fatigue Patient left: in bed;with call bell/phone within reach;with bed alarm set;with nursing/sitter in room  OT Visit Diagnosis: Unsteadiness on feet (R26.81);Muscle weakness (generalized) (M62.81);Other symptoms and signs involving cognitive function                Time: 0865-7846 OT Time Calculation (min): 16 min Charges:  OT General Charges $OT Visit: 1 Visit OT Evaluation $OT Eval Moderate Complexity: 1 Mod  [facility] MS, OTR/L ascom [phone]  10/15/22, 5:03 PM

## 2022-10-15 NOTE — Evaluation (Signed)
Physical Therapy Evaluation Patient Details Name: Morgan Dennis MRN: [id number] DOB: 1934-06-07 Today's Date: 10/15/2022  History of Present Illness  Pt is an 88 y/o female admitted from a memory care facility secondary to syncope, cough and abdominal pain. Pt found to have metabolic encephalopathy and septic shock. PMH including but not limited to hypertension, hyperlipidemia, GERD, depression with anxiety, Parkinson's disease, dementia, rheumatoid arthritis, recent admission due to COVID-19 infection on 2 L oxygen.   Clinical Impression  Pt presented supine in bed with HOB elevated, awake and willing to participate in therapy session. Pt with cognitive deficits at baseline (dementia) and with no family/caregivers present during evaluation to provide any reliable information regarding home environment or PLOF. Per chart review, pt was from [facility] memory care facility ALF. At the time of evaluation, pt able to complete bed mobility with supervision and transfers with min A. She was slightly impulsive with transfers but otherwise her behavior throughout was appropriate, yet pleasantly confused. Pt would continue to benefit from skilled physical therapy services at this time while admitted and after d/c to address the below listed limitations in order to improve overall safety and independence with functional mobility.        Recommendations for follow up therapy are one component of a multi-disciplinary discharge planning process, led by the attending physician.  Recommendations may be updated based on patient status, additional functional criteria and insurance authorization.  Follow Up Recommendations Skilled nursing-short term rehab (<3 hours/day) Can patient physically be transported by private vehicle: No    Assistance Recommended at Discharge Frequent or constant Supervision/Assistance  Patient can return home with the following  A lot of help with walking and/or transfers;A lot of  help with bathing/dressing/bathroom;Assistance with cooking/housework;Assist for transportation    Equipment Recommendations None recommended by PT  Recommendations for Other Services       Functional Status Assessment Patient has had a recent decline in their functional status and demonstrates the ability to make significant improvements in function in a reasonable and predictable amount of time.     Precautions / Restrictions Precautions Precautions: Fall Precaution Comments: Air cabin crew Restrictions Weight Bearing Restrictions: No      Mobility  Bed Mobility Overal bed mobility: Needs Assistance Bed Mobility: Supine to Sit     Supine to sit: HOB elevated, Supervision     General bed mobility comments: increased time and effort, HOB elevated, use of bed rail    Transfers Overall transfer level: Needs assistance Equipment used: None, 1 person hand held assist Transfers: Sit to/from Stand Sit to Stand: Min assist           General transfer comment: pt slightly impulsive with standing from EOB, reaching forwards for sink to utilize for support, PT then providing HHA, min A for overall stability. Pt also impulsively "plopping" back down to a sitting position at EOB without any warning or indication that she was going to sit    Ambulation/Gait                  Stairs            Wheelchair Mobility    Modified Rankin (Stroke Patients Only)       Balance Overall balance assessment: Needs assistance Sitting-balance support: Feet supported Sitting balance-Leahy Scale: Fair     Standing balance support: During functional activity, Single extremity supported, No upper extremity supported Standing balance-Leahy Scale: Poor  Pertinent Vitals/Pain Pain Assessment Pain Assessment: No/denies pain    Home Living Family/patient expects to be discharged to:: Other (Comment)                    Additional Comments: per chart review, pt living in a memory care facility    Prior Function Prior Level of Function : Patient poor historian/Family not available                     Hand Dominance        Extremity/Trunk Assessment   Upper Extremity Assessment Upper Extremity Assessment: Difficult to assess due to impaired cognition;Overall [facility] for tasks assessed    Lower Extremity Assessment Lower Extremity Assessment: Difficult to assess due to impaired cognition;Overall [facility] for tasks assessed    Cervical / Trunk Assessment Cervical / Trunk Assessment: Kyphotic  Communication   Communication: No difficulties  Cognition Arousal/Alertness: Awake/alert Behavior During Therapy: Impulsive Overall Cognitive Status: History of cognitive impairments - at baseline Area of Impairment: Orientation, Attention, Memory, Following commands, Safety/judgement, Problem solving                 Orientation Level: Disoriented to, Time, Situation Current Attention Level: Selective Memory: Decreased short-term memory Following Commands: Follows one step commands inconsistently, Follows one step commands with increased time Safety/Judgement: Decreased awareness of safety, Decreased awareness of deficits   Problem Solving: Slow processing, Decreased initiation, Difficulty sequencing, Requires verbal cues, Requires tactile cues          General Comments      Exercises     Assessment/Plan    PT Assessment Patient needs continued PT services  PT Problem List Decreased strength;Decreased activity tolerance;Decreased balance;Decreased mobility;Decreased coordination;Decreased cognition;Decreased knowledge of use of DME;Decreased safety awareness;Decreased knowledge of precautions       PT Treatment Interventions DME instruction;Gait training;Stair training;Therapeutic activities;Therapeutic exercise;Balance training;Functional mobility training;Neuromuscular  re-education;Patient/family education    PT Goals (Current goals can be found in the Care Plan section)  Acute Rehab PT Goals Patient Stated Goal: unable to state a goal PT Goal Formulation: Patient unable to participate in goal setting Time For Goal Achievement: 10/29/22 Potential to Achieve Goals: Fair    Frequency Min 2X/week     Co-evaluation               AM-PAC PT "6 Clicks" Mobility  Outcome Measure Help needed turning from your back to your side while in a flat bed without using bedrails?: None Help needed moving from lying on your back to sitting on the side of a flat bed without using bedrails?: None Help needed moving to and from a bed to a chair (including a wheelchair)?: None Help needed standing up from a chair using your arms (e.g., wheelchair or bedside chair)?: A Little Help needed to walk in hospital room?: A Lot Help needed climbing 3-5 steps with a railing? : Total 6 Click Score: 18    End of Session Equipment Utilized During Treatment: Oxygen Activity Tolerance: Patient tolerated treatment well Patient left: in bed;with call bell/phone within reach;with nursing/sitter in room;Other (comment) (sitting upright at EOB with RN and safety sitter present) Nurse Communication: Mobility status PT Visit Diagnosis: Other abnormalities of gait and mobility (R26.89)    Time: 8588-5027 PT Time Calculation (min) (ACUTE ONLY): 13 min   Charges:   PT Evaluation $PT Eval Moderate Complexity: 1 Mod          Eduard Clos, PT, DPT  Acute Rehabilitation Services  Office [place] 10/15/2022, 12:48 PM

## 2022-10-15 NOTE — TOC Progression Note (Signed)
Transition of Care [facility]) - Progression Note    Patient Details  Name: JERZEY KOMPERDA MRN: [id number] Date of Birth: [birth year]  [facility] CM/SW [place], LCSW Phone Number: 10/15/2022, 11:33 AM  Clinical Narrative:    Per MD, now need SNF for rehab. Asked for PT OT evals. TOC to follow for recs.    Expected Discharge Plan: [place] Barriers to Discharge: Continued Medical Work up  Expected Discharge Plan and Services Expected Discharge Plan: [place]   Discharge Planning Services: CM Consult Post Acute Care Choice: [place] Living arrangements for the past 2 months: [place]                 DME Arranged: N/A DME Agency: NA                   Social Determinants of Health (SDOH) Interventions    Readmission Risk Interventions     No data to display

## 2022-10-15 NOTE — Progress Notes (Signed)
       CROSS COVER NOTE  NAME: Morgan Dennis MRN: [id number] DOB : Mar 01, 1934       HPI/Events of Note   Patient with severe agitation pulling at lines and hitting at staff. Has history of prolonged QT.  Attempted trazodone and valium to help patient rest.   Assessment and  Interventions   Assessment:  Plan: Safety sitter Delirium interventions       Kathlene Cote NP [facility]

## 2022-10-16 DIAGNOSIS — Z515 Encounter for palliative care: Secondary | ICD-10-CM | POA: Diagnosis not present

## 2022-10-16 DIAGNOSIS — A419 Sepsis, unspecified organism: Secondary | ICD-10-CM | POA: Diagnosis not present

## 2022-10-16 DIAGNOSIS — Z7189 Other specified counseling: Secondary | ICD-10-CM | POA: Diagnosis not present

## 2022-10-16 DIAGNOSIS — R55 Syncope and collapse: Secondary | ICD-10-CM | POA: Diagnosis not present

## 2022-10-16 DIAGNOSIS — R6520 Severe sepsis without septic shock: Secondary | ICD-10-CM | POA: Diagnosis not present

## 2022-10-16 DIAGNOSIS — Z66 Do not resuscitate: Secondary | ICD-10-CM | POA: Diagnosis not present

## 2022-10-16 LAB — CBC WITH DIFFERENTIAL/PLATELET
Abs Immature Granulocytes: 0.85 10*3/uL — ABNORMAL HIGH (ref 0.00–0.07)
Basophils Absolute: 0.1 10*3/uL (ref 0.0–0.1)
Basophils Relative: 0 %
Eosinophils Absolute: 0.4 10*3/uL (ref 0.0–0.5)
Eosinophils Relative: 3 %
HCT: 28 % — ABNORMAL LOW (ref 36.0–46.0)
Hemoglobin: 8.9 g/dL — ABNORMAL LOW (ref 12.0–15.0)
Immature Granulocytes: 6 %
Lymphocytes Relative: 15 %
Lymphs Abs: 2.3 10*3/uL (ref 0.7–4.0)
MCH: 32.5 pg (ref 26.0–34.0)
MCHC: 31.8 g/dL (ref 30.0–36.0)
MCV: 102.2 fL — ABNORMAL HIGH (ref 80.0–100.0)
Monocytes Absolute: 1.7 10*3/uL — ABNORMAL HIGH (ref 0.1–1.0)
Monocytes Relative: 11 %
Neutro Abs: 9.7 10*3/uL — ABNORMAL HIGH (ref 1.7–7.7)
Neutrophils Relative %: 65 %
Platelets: 203 10*3/uL (ref 150–400)
RBC: 2.74 MIL/uL — ABNORMAL LOW (ref 3.87–5.11)
RDW: 14.6 % (ref 11.5–15.5)
Smear Review: NORMAL
WBC: 14.9 10*3/uL — ABNORMAL HIGH (ref 4.0–10.5)
nRBC: 0.2 % (ref 0.0–0.2)

## 2022-10-16 LAB — BASIC METABOLIC PANEL
Anion gap: 7 (ref 5–15)
BUN: 10 mg/dL (ref 8–23)
CO2: 29 mmol/L (ref 22–32)
Calcium: 8 mg/dL — ABNORMAL LOW (ref 8.9–10.3)
Chloride: 105 mmol/L (ref 98–111)
Creatinine, Ser: 0.61 mg/dL (ref 0.44–1.00)
GFR, Estimated: >60 mL/min (ref >60)
Glucose, Bld: 108 mg/dL — ABNORMAL HIGH (ref 70–99)
Potassium: 3.8 mmol/L (ref 3.5–5.1)
Sodium: 141 mmol/L (ref 135–145)

## 2022-10-16 LAB — PATHOLOGIST SMEAR REVIEW

## 2022-10-16 LAB — PHOSPHORUS: Phosphorus: 2.6 mg/dL (ref 2.5–4.6)

## 2022-10-16 LAB — MAGNESIUM: Magnesium: 1.8 mg/dL (ref 1.7–2.4)

## 2022-10-16 NOTE — Progress Notes (Signed)
Daily Progress Note   Patient Name: Morgan Dennis       Date: 10/16/2022 DOB: Mar 10, 1934  Age: 88 y.o. MRN#: [id number] Attending Physician: Shawna Clamp, MD Primary Care Physician: Jerrol Banana., MD Admit Date: 10/07/2022 Length of Stay: 9 days  Reason for Consultation/Follow-up: Establishing goals of care  HPI/Patient Profile:  88 y.o. female  with past medical history of HTN, HLD, Parkinson's, dementia, anxiety, depression, rheumatoid arthritis, and recent COVID-19 infection (hospitalization 8/31-9/2, d/c w/Paxlovid/steroids/2L [place]) admitted on 10/07/2022 with syncope and hypotension.   Patient is being treated for acute on chronic hypoxic respiratory failure, and shock likely from colitis/hypotension.    PMT was consulted to assist with [place].   Subjective:   Subjective: Chart Reviewed. Updates received. Patient Assessed. Created space and opportunity for patient  and family to explore thoughts and feelings regarding current medical situation.  Today's Discussion: I saw the patient at the bedside, a 24-hour sitter was with her.  She is sitting up, eating.  She is pleasantly confused.  She is only oriented x1, is not sure where she is at and thinks the year is 17.  Of note, she has eaten 75 to 100% of her meal tray and has apparent good appetite.  She states she feels poorly and just "blah" overall, but denies pain, nausea, vomiting, dyspnea.  She states it is okay to speak with her son.  I called the patient's son later in the day.  He states that she has gotten better over the weekend and they are planning for skilled nursing facility (pending bed offers).  They have declined hospice at this time as they feel that this is been a treatable episode.  He did ask for social work to call and update on status of bed offers.  We discussed the role of outpatient palliative care to continue following along given that their goals are patient to be comfortable and they noted she will  likely decline in the future.  He is agreeable to this.  He states that he has not a chance to review the most.  He is agreeable to completing this.  I did offer to meet in person tomorrow he prefers for me to call him and complete electronically over the phone tomorrow.  I provided emotional and general support through therapeutic listening, empathy, sharing of stories, and other techniques. I answered all questions and addressed all concerns to the best of my ability.  Review of Systems  Constitutional:        Denies pain in general  Respiratory:  Negative for shortness of breath.   Cardiovascular:  Negative for chest pain.  Gastrointestinal:  Negative for abdominal pain, nausea and vomiting.    Objective:   Vital Signs:  BP 135/61 (BP Location: Right Arm)   Pulse 75   Temp 97.7 F (36.5 C) (Oral)   Resp 18   Ht '5\' 4"'$  (1.626 m)   Wt 80.4 kg   SpO2 99%   BMI 30.42 kg/m   Physical Exam: Physical Exam Vitals and nursing note reviewed.  Constitutional:      General: She is not in acute distress.    Appearance: She is obese. She is ill-appearing.  HENT:     Head: Normocephalic and atraumatic.  Cardiovascular:     Rate and Rhythm: Normal rate.  Pulmonary:     Effort: Pulmonary effort is normal. No respiratory distress.  Abdominal:     Palpations: Abdomen is soft.     Tenderness:  There is no abdominal tenderness.  Skin:    General: Skin is warm and dry.  Neurological:     Mental Status: She is disoriented.  Psychiatric:        Mood and Affect: Mood normal.        Behavior: Behavior normal.     Palliative Assessment/Data: 40%    Existing Vynca/ACP Documentation: DNR dated 08/19/2022  Assessment & Plan:   Impression: Present on Admission:  HCAP (healthcare-associated pneumonia)  Severe sepsis ([place])  Acute colitis  Dementia without behavioral disturbance ([place])  Essential hypertension  Depression with anxiety  Parkinson's disease  Rheumatoid arthritis  ([place])  Myocardial injury  AKI (acute kidney injury) ([place])  Syncope  Severe sepsis with acute organ dysfunction ([place])  SUMMARY OF RECOMMENDATIONS   Remain DNR Continue to treat the treatable TOC consult for outpatient palliative care Requested social worker to call son and updated Plan to complete MOST form electronically tomorrow PMT will continue to follow  Symptom Management:  Per primary team PMT is available to assist as needed  Code Status: DNR  Prognosis: Unable to determine  Discharge Planning: [place] for rehab with Palliative care service follow-up  Discussed with: Patient's family, medical team, nursing team  Thank you for allowing [place] to participate in the care of VALERYA MAXTON PMT will continue to support holistically.  Billing based on MDM: High  Problems Addressed: One acute or chronic illness or injury that poses a threat to life or bodily function  Amount and/or Complexity of Data: Category 3:Discussion of management or test interpretation with external physician/other qualified health care professional/appropriate source (not separately reported)  Risks: Decision not to resuscitate or to de-escalate care because of poor prognosis (Confirmed DNR)  Walden Field, NP Palliative Medicine Team  Team Phone # [phone] (Nights/Weekends)  08/16/2021, 8:17 AM

## 2022-10-16 NOTE — Progress Notes (Signed)
Occupational Therapy Treatment Patient Details Name: Morgan Dennis MRN: [id number] DOB: 09-10-1934 Today's Date: 10/16/2022   History of present illness Pt is an 88 y/o female admitted from a memory care facility secondary to syncope, cough and abdominal pain. Pt found to have metabolic encephalopathy and septic shock. PMH including but not limited to hypertension, hyperlipidemia, GERD, depression with anxiety, Parkinson's disease, dementia, rheumatoid arthritis, recent admission due to COVID-19 infection on 2 L oxygen.   OT comments  Morgan Dennis was seen for OT treatment on this date. Upon arrival to room pt reclined in chair, agreeable to tx. Pt requesting to return to bed. MIN A + RW sit<>stand and ~5 ft mobility, difficulty navigating lines/leads. MOD A don/doff underwear sit<>stand, requires BUE support standing. Pt making progress toward goals, will continue to follow POC. Discharge recommendation remains appropriate.     Recommendations for follow up therapy are one component of a multi-disciplinary discharge planning process, led by the attending physician.  Recommendations may be updated based on patient status, additional functional criteria and insurance authorization.    Follow Up Recommendations  Skilled nursing-short term rehab (<3 hours/day)    Assistance Recommended at Discharge Frequent or constant Supervision/Assistance  Patient can return home with the following  A lot of help with walking and/or transfers;A lot of help with bathing/dressing/bathroom;Assist for transportation;Help with stairs or ramp for entrance;Assistance with cooking/housework;Direct supervision/assist for medications management   Equipment Recommendations  Other (comment) (defer)    Recommendations for Other Services      Precautions / Restrictions Precautions Precautions: Fall Precaution Comments: safety sitter Restrictions Weight Bearing Restrictions: No       Mobility Bed Mobility Overal bed  mobility: Needs Assistance Bed Mobility: Sit to Supine       Sit to supine: Min guard        Transfers Overall transfer level: Needs assistance Equipment used: Rolling walker (2 wheels) Transfers: Sit to/from Stand Sit to Stand: Min assist                 Balance Overall balance assessment: Needs assistance Sitting-balance support: Feet supported Sitting balance-Leahy Scale: Fair     Standing balance support: Bilateral upper extremity supported, Reliant on assistive device for balance Standing balance-Leahy Scale: Fair                             ADL either performed or assessed with clinical judgement   ADL Overall ADL's : Needs assistance/impaired                                       General ADL Comments: MIN A + RW for simulated toilet t/f. MOD A don/doff underwear sit<>stand, requires BUE support standing      Cognition Arousal/Alertness: Awake/alert Behavior During Therapy: Impulsive, WFL for tasks assessed/performed Overall Cognitive Status: No family/caregiver present to determine baseline cognitive functioning Area of Impairment: Orientation, Attention, Memory, Following commands, Safety/judgement, Problem solving                 Orientation Level: Place, Situation Current Attention Level: Selective Memory: Decreased short-term memory Following Commands: Follows one step commands inconsistently, Follows one step commands with increased time Safety/Judgement: Decreased awareness of safety, Decreased awareness of deficits   Problem Solving: Slow processing, Requires verbal cues, Requires tactile cues  General Comments SpO2 maintained >90% on 4L O2 via [place]    Pertinent Vitals/ Pain       Pain Assessment Pain Assessment: No/denies pain  Home Living Family/patient expects to be discharged to:: Other (Comment)                                 Additional Comments: per chart review, pt  living in a memory care facility      Prior Functioning/Environment              Frequency  Min 2X/week        Progress Toward Goals  OT Goals(current goals can now be found in the care plan section)  Progress towards OT goals: Progressing toward goals  Acute Rehab OT Goals Patient Stated Goal: to have chocolate bar OT Goal Formulation: With patient Time For Goal Achievement: 10/29/22 Potential to Achieve Goals: Fair ADL Goals Pt Will Perform Grooming: with min guard assist;standing Pt Will Perform Lower Body Dressing: with min assist;sit to/from stand Pt Will Transfer to Toilet: ambulating;bedside commode;with min guard assist Pt Will Perform Toileting - Clothing Manipulation and hygiene: with min assist;sit to/from stand  Plan Discharge plan remains appropriate;Frequency remains appropriate    Co-evaluation                 AM-PAC OT "6 Clicks" Daily Activity     Outcome Measure   Help from another person eating meals?: None Help from another person taking care of personal grooming?: A Little Help from another person toileting, which includes using toliet, bedpan, or urinal?: A Lot Help from another person bathing (including washing, rinsing, drying)?: A Lot Help from another person to put on and taking off regular upper body clothing?: A Little Help from another person to put on and taking off regular lower body clothing?: A Lot 6 Click Score: 16    End of Session Equipment Utilized During Treatment: Rolling walker (2 wheels);Oxygen  OT Visit Diagnosis: Unsteadiness on feet (R26.81);Muscle weakness (generalized) (M62.81);Other symptoms and signs involving cognitive function   Activity Tolerance Patient tolerated treatment well;Patient limited by fatigue   Patient Left in bed;with call bell/phone within reach;with bed alarm set;with nursing/sitter in room   Nurse Communication          Time: 4128-7867 OT Time Calculation (min): 14 min  Charges:  OT General Charges $OT Visit: 1 Visit OT Treatments $Self Care/Home Management : 8-22 mins  Morgan Dennis, M.S. OTR/L  10/16/22, 4:12 PM  ascom [phone]

## 2022-10-16 NOTE — Care Management Important Message (Signed)
Important Message  Patient Details  Name: Morgan Dennis MRN: [id number] Date of Birth: 20-Dec-1933   Medicare Important Message Given:  Yes     Dannette Barbara 10/16/2022, 10:50 AM

## 2022-10-16 NOTE — Progress Notes (Signed)
PROGRESS NOTE    Morgan Dennis  PXT:[id number] DOB: December 20, 1933 DOA: 10/07/2022  PCP: Jerrol Banana., MD    Brief Narrative: This 88 years old female with PMH significant for hypertension, hyperlipidemia, dementia, Parkinson disease, anxiety, depression, rheumatoid arthritis and recent COVID-19 infection presented in the ED from memory care center with chief complaint of syncope and hypotension.  Patient had witnessed syncopal episode in memory unit and was found to be hypotensive.  She was recently hospitalized and was treated with paxloid  and steroids for COVID-19 infection.  She was discharged on 2 L of supplemental oxygen.  Patient was found to be septic and started empirically on antibiotics due to suspected colitis and possible HCAP versus aspiration pneumonia.  Patient was hypotensive requiring pressor support. TRH pick up 10/11/22.  Assessment & Plan:   Principal Problem:   Severe sepsis ([place]) Active Problems:   Acute colitis   HCAP (healthcare-associated pneumonia)   Myocardial injury   AKI (acute kidney injury) ([place])   Essential hypertension   Parkinson's disease   Rheumatoid arthritis ([place])   Syncope   Dementia without behavioral disturbance ([place])   Depression with anxiety   Severe sepsis with acute organ dysfunction ([facility])  Acute metabolic encephalopathy > multifactorial. CT head no acute abnormality.  Avoid sedating medications. Could be due to dementia, Parkinson's disease. Continue Sinemet, Namenda and Aricept for dementia Mental status improved, seems at her baseline.  Acute on chronic hypoxic respiratory failure: Acute bilateral pulmonary embolism: Family does not want any aggressive intervention. Continue supplemental oxygen and wean as tolerated.  She weaned down to 5L of supplemental oxygen. Patient is on anticoagulation but DOAC will need to be discussed prior to discharge given risk of falls with her dementia and Parkinson's disease.   Initiated on  heparin infusion, discontinued 10/26 since patient coughed up blood. Heparin resumed.  Continue to monitor platelet count. Spoke with son Lana Flaim in detail, given risk of falls with dementia and Parkinson's disease. He is not in favor of anti-coagulation, anti-coagulation discontinued.  Son is understandable,  states his father died because of ICH due to being on anticoagulation after fall.  AKI likely prerenal due to ATN. Renal functions improved.  Thrombocytopenia:> Resolved. Continue to monitor platelet count while on heparin. Platelets improved to 85> 119> 142>177>220  Septic shock secondary to Suspected colitis/ HCAP: Patient was hypotensive requiring Levophed support. She was started on stress dose steroids.  Septic shock resolved. Patient reports having abdominal pain. CT abdomen shows possible colitis. Initiated on broad-spectrum antibiotics aztreonam and metronidazole Blood and urine cultures no growth so far. Antibiotics discontinued after 6 days.  WBC trending down 18.2 Levaquin restarted given high WBC count and risk for aspiration. WBC trending down. Solucortef tapered off and discontinued.  Goals of care discussion Discussed with son at bedside.  Explained in detail about the poor prognosis. Son states that he does not want her to be aggressively managed. If there is decline from current situation they are agreeable to transition to comfort care. Anticoagulation discontinued after long discussion about risks and benefit.   DVT prophylaxis:SCDs Code Status: DNR Family Communication: Spoke with son at bedside. Disposition Plan:   Status is: Inpatient Remains inpatient appropriate because: Admitted for acute metabolic encephalopathy and septic shock requiring Levophed support.  She is also found to have pulmonary embolism requiring anticoagulation.  Palliative care consulted to discuss goals of care.  PT/OT: SNF, pending insurance authorization.   Consultants:   PCCM  Procedures: None  Antimicrobials: Anti-infectives (From admission, onward)    Start     Dose/Rate Route Frequency Ordered Stop   10/13/22 1230  levofloxacin (LEVAQUIN) IVPB 750 mg        750 mg 100 mL/hr over 90 Minutes Intravenous Daily 10/13/22 1138     10/10/22 0100  vancomycin (VANCOREADY) IVPB 750 mg/150 mL  Status:  Discontinued       See Hyperspace for full Linked Orders Report.   750 mg 150 mL/hr over 60 Minutes Intravenous Every 24 hours 10/09/22 0009 10/09/22 1017   10/09/22 0100  vancomycin (VANCOREADY) IVPB 1500 mg/300 mL       See Hyperspace for full Linked Orders Report.   1,500 mg 150 mL/hr over 120 Minutes Intravenous  Once 10/09/22 0009 10/09/22 0247   10/08/22 0400  metroNIDAZOLE (FLAGYL) IVPB 500 mg  Status:  Discontinued        500 mg 100 mL/hr over 60 Minutes Intravenous Every 12 hours 10/07/22 1823 10/12/22 1228   10/07/22 2200  aztreonam (AZACTAM) 1 g in sodium chloride 0.9 % 100 mL IVPB  Status:  Discontinued        1 g 200 mL/hr over 30 Minutes Intravenous Every 8 hours 10/07/22 1859 10/12/22 1228   10/07/22 1902  vancomycin variable dose per unstable renal function (pharmacist dosing)  Status:  Discontinued         Does not apply See admin instructions 10/07/22 1906 10/09/22 0012   10/07/22 1845  vancomycin (VANCOREADY) IVPB 2000 mg/400 mL        2,000 mg 200 mL/hr over 120 Minutes Intravenous  Once 10/07/22 1841 10/07/22 2358   10/07/22 1615  aztreonam (AZACTAM) 2 g in sodium chloride 0.9 % 100 mL IVPB  Status:  Discontinued        2 g 200 mL/hr over 30 Minutes Intravenous Every 8 hours 10/07/22 1603 10/07/22 1859   10/07/22 1615  vancomycin (VANCOCIN) IVPB 1000 mg/200 mL premix  Status:  Discontinued        1,000 mg 200 mL/hr over 60 Minutes Intravenous  Once 10/07/22 1603 10/07/22 1841   10/07/22 1615  metroNIDAZOLE (FLAGYL) IVPB 500 mg        500 mg 100 mL/hr over 60 Minutes Intravenous  Once 10/07/22 1603 10/07/22 1848        Subjective: Patient was seen and examined at bedside.  Overnight events noted. Patient appears chronically ill looking, very deconditioned, alert and following commands She has tolerated mechanical soft diet.  She is awaiting placement in SNF pending insurance Auth.  Objective: Vitals:   10/15/22 0844 10/15/22 1837 10/15/22 2013 10/16/22 0505  BP: 130/66 (!) 143/69 129/60 135/61  Pulse: 72 88 97 75  Resp: '17 15 17 18  '$ Temp: 97.8 F (36.6 C) 99.1 F (37.3 C) 97.8 F (36.6 C) 97.7 F (36.5 C)  TempSrc:   Oral Oral  SpO2: 91% 94% 92% 99%  Weight:      Height:        Intake/Output Summary (Last 24 hours) at 10/16/2022 1248 Last data filed at 10/15/2022 1613 Gross per 24 hour  Intake 628.94 ml  Output --  Net 628.94 ml   Filed Weights   10/07/22 1800 10/08/22 0037  Weight: 84.9 kg 80.4 kg    Examination:  General exam: Appears comfortable, chronically ill looking, very deconditioned, NAD Respiratory system: CTA bilaterally, respiratory effort normal, RR 16. Cardiovascular system: S1 & S2 heard, regular rate rhythm, no murmur. Gastrointestinal system: Abdomen is soft, non  tender, non distended, BS+ Central nervous system: Alert and oriented x 2, no focal neurological deficits. Extremities: No edema, no cyanosis, no clubbing Skin: No rashes, lesions or ulcers Psychiatry: Mood & affect appropriate.     Data Reviewed: I have personally reviewed following labs and imaging studies  CBC: Recent Labs  Lab 10/12/22 0446 10/13/22 0330 10/14/22 0521 10/15/22 0815 10/16/22 0550  WBC 20.6* 18.4* 18.2* 16.2* 14.9*  NEUTROABS 16.2* 13.6* 11.9* 9.8* 9.7*  HGB 9.4* 9.5* 9.9* 9.7* 8.9*  HCT 28.9* 29.1* 30.8* 29.9* 28.0*  MCV 98.0 99.3 102.7* 99.3 102.2*  PLT 119* 142* 177 220 355   Basic Metabolic Panel: Recent Labs  Lab 10/10/22 0101 10/10/22 0705 10/10/22 2035 10/11/22 0335 10/12/22 0446 10/13/22 0330 10/14/22 0521 10/16/22 0550  NA 139  --   --  141 142  --   141 141  K 3.3*  3.5   < > 3.6 3.5 3.3*  --  4.3 3.8  CL 108  --   --  111 111  --  110 105  CO2 27  --   --  25 24  --  27 29  GLUCOSE 87  --   --  115* 119*  --  113* 108*  BUN 23  --   --  23 16  --  13 10  CREATININE 0.69  --   --  0.58 0.54  --  0.53 0.61  CALCIUM 7.3*  --   --  7.4* 7.2*  --  7.7* 8.0*  MG 2.1  --   --  2.2 2.2 2.2 2.0 1.8  PHOS 2.2*  3.0   < > 1.6* 3.7 2.2* 1.8* 2.8 2.6   < > = values in this interval not displayed.   GFR: Estimated Creatinine Clearance: 52.8 mL/min (by C-G formula based on SCr of 0.61 mg/dL). Liver Function Tests: No results for input(s): "AST", "ALT", "ALKPHOS", "BILITOT", "PROT", "ALBUMIN" in the last 168 hours.  No results for input(s): "LIPASE", "AMYLASE" in the last 168 hours.  No results for input(s): "AMMONIA" in the last 168 hours. Coagulation Profile: No results for input(s): "INR", "PROTIME" in the last 168 hours. Cardiac Enzymes: No results for input(s): "CKTOTAL", "CKMB", "CKMBINDEX", "TROPONINI" in the last 168 hours. BNP (last 3 results) No results for input(s): "PROBNP" in the last 8760 hours. HbA1C: No results for input(s): "HGBA1C" in the last 72 hours. CBG: Recent Labs  Lab 10/14/22 1206 10/14/22 1601 10/14/22 2027 10/15/22 0035 10/15/22 0847  GLUCAP 178* 118* 94 107* 71   Lipid Profile: No results for input(s): "CHOL", "HDL", "LDLCALC", "TRIG", "CHOLHDL", "LDLDIRECT" in the last 72 hours. Thyroid Function Tests: No results for input(s): "TSH", "T4TOTAL", "FREET4", "T3FREE", "THYROIDAB" in the last 72 hours. Anemia Panel: No results for input(s): "VITAMINB12", "FOLATE", "FERRITIN", "TIBC", "IRON", "RETICCTPCT" in the last 72 hours. Sepsis Labs: No results for input(s): "PROCALCITON", "LATICACIDVEN" in the last 168 hours.   Recent Results (from the past 240 hour(s))  Resp Panel by RT-PCR (Flu A&B, Covid) Anterior Nasal Swab     Status: None   Collection Time: 10/07/22  2:08 PM   Specimen: Anterior Nasal  Swab  Result Value Ref Range Status   SARS Coronavirus 2 by RT PCR NEGATIVE NEGATIVE Final    Comment: (NOTE) SARS-CoV-2 target nucleic acids are NOT DETECTED.  The SARS-CoV-2 RNA is generally detectable in upper respiratory specimens during the acute phase of infection. The lowest concentration of SARS-CoV-2 viral copies this assay can detect is  138 copies/mL. A negative result does not preclude SARS-Cov-2 infection and should not be used as the sole basis for treatment or other patient management decisions. A negative result may occur with  improper specimen collection/handling, submission of specimen other than nasopharyngeal swab, presence of viral mutation(s) within the areas targeted by this assay, and inadequate number of viral copies(<138 copies/mL). A negative result must be combined with clinical observations, patient history, and epidemiological information. The expected result is Negative.  Fact Sheet for Patients:  EntrepreneurPulse.com.au  Fact Sheet for Healthcare Providers:  IncredibleEmployment.be  This test is no t yet approved or cleared by the [place] FDA and  has been authorized for detection and/or diagnosis of SARS-CoV-2 by FDA under an Emergency Use Authorization (EUA). This EUA will remain  in effect (meaning this test can be used) for the duration of the COVID-19 declaration under Section 564(b)(1) of the Act, 21 U.S.C.section 360bbb-3(b)(1), unless the authorization is terminated  or revoked sooner.       Influenza A by PCR NEGATIVE NEGATIVE Final   Influenza B by PCR NEGATIVE NEGATIVE Final    Comment: (NOTE) The Xpert Xpress SARS-CoV-2/FLU/RSV plus assay is intended as an aid in the diagnosis of influenza from Nasopharyngeal swab specimens and should not be used as a sole basis for treatment. Nasal washings and aspirates are unacceptable for Xpert Xpress SARS-CoV-2/FLU/RSV testing.  Fact Sheet for  Patients: EntrepreneurPulse.com.au  Fact Sheet for Healthcare Providers: IncredibleEmployment.be  This test is not yet approved or cleared by the [place] FDA and has been authorized for detection and/or diagnosis of SARS-CoV-2 by FDA under an Emergency Use Authorization (EUA). This EUA will remain in effect (meaning this test can be used) for the duration of the COVID-19 declaration under Section 564(b)(1) of the Act, 21 U.S.C. section 360bbb-3(b)(1), unless the authorization is terminated or revoked.  Performed at [facility], [address] [place], [place] [address]   Culture, blood (routine x 2)     Status: None   Collection Time: 10/07/22  4:00 PM   Specimen: BLOOD  Result Value Ref Range Status   Specimen Description BLOOD BLOOD LEFT ARM  Final   Special Requests   Final    BOTTLES DRAWN AEROBIC AND ANAEROBIC Blood Culture adequate volume   Culture   Final    NO GROWTH 5 DAYS Performed at [facility], [address] [place], [place] [address]    Report Status 10/12/2022 FINAL  Final  Urine Culture     Status: Abnormal   Collection Time: 10/07/22  4:45 PM   Specimen: Urine, Random  Result Value Ref Range Status   Specimen Description   Final    URINE, RANDOM Performed at [facility], [address], [place], [place] [address]    Special Requests   Final    NONE Performed at [facility], [address] [place], [place] [address]    Culture 30,000 COLONIES/mL ESCHERICHIA COLI (A)  Final   Report Status 10/10/2022 FINAL  Final   Organism ID, Bacteria ESCHERICHIA COLI (A)  Final      Susceptibility   Escherichia coli - MIC*    AMPICILLIN 4 SENSITIVE Sensitive     CEFAZOLIN <=4 SENSITIVE Sensitive     CEFEPIME <=0.12 SENSITIVE Sensitive     CEFTRIAXONE <=0.25 SENSITIVE Sensitive     CIPROFLOXACIN <=0.25 SENSITIVE Sensitive     GENTAMICIN <=1 SENSITIVE Sensitive     IMIPENEM  <=0.25 SENSITIVE Sensitive  NITROFURANTOIN <=16 SENSITIVE Sensitive     TRIMETH/SULFA <=20 SENSITIVE Sensitive     AMPICILLIN/SULBACTAM <=2 SENSITIVE Sensitive     PIP/TAZO <=4 SENSITIVE Sensitive     * 30,000 COLONIES/mL ESCHERICHIA COLI  Culture, blood (routine x 2)     Status: None   Collection Time: 10/07/22  8:37 PM   Specimen: BLOOD RIGHT HAND  Result Value Ref Range Status   Specimen Description BLOOD RIGHT HAND  Final   Special Requests   Final    BOTTLES DRAWN AEROBIC ONLY Blood Culture adequate volume   Culture   Final    NO GROWTH 5 DAYS Performed at [facility], [address], [place], [place] [address]    Report Status 10/12/2022 FINAL  Final  MRSA Next Gen by PCR, Nasal     Status: None   Collection Time: 10/08/22 12:35 AM   Specimen: Nasal Mucosa; Nasal Swab  Result Value Ref Range Status   MRSA by PCR Next Gen NOT DETECTED NOT DETECTED Final    Comment: (NOTE) The GeneXpert MRSA Assay (FDA approved for NASAL specimens only), is one component of a comprehensive MRSA colonization surveillance program. It is not intended to diagnose MRSA infection nor to guide or monitor treatment for MRSA infections. Test performance is not FDA approved in patients less than 65 years old. Performed at [facility], [address], [place], [place] [address]     Radiology Studies: No results found.  Scheduled Meds:  aspirin EC  81 mg Oral Daily   atorvastatin  20 mg Oral QHS   carbidopa-levodopa  2 tablet Oral TID   carbidopa-levodopa  2 tablet Oral QHS   And   entacapone  200 mg Oral QHS   donepezil  10 mg Oral QHS   DULoxetine  60 mg Oral Daily   famotidine  20 mg Oral QHS   folic acid  1 mg Oral Daily   melatonin  5 mg Oral QHS   memantine  10 mg Oral BID   metoprolol tartrate  25 mg Oral BID   multivitamin with minerals  1 tablet Oral Daily   nystatin  5 mL Oral QID   pregabalin  150 mg Oral QHS   vitamin E  400 Units Oral Daily    Continuous Infusions:  sodium chloride Stopped (10/14/22 1210)   levofloxacin (LEVAQUIN) IV 750 mg (10/16/22 1000)     LOS: 9 days    Time spent: 35 mins    Darrin Apodaca, MD [facility]   If 7PM-7AM, please contact night-coverage

## 2022-10-16 NOTE — Progress Notes (Addendum)
Physical Therapy Treatment Patient Details Name: Morgan Dennis MRN: [id number] DOB: [birth year] Today's Date: 10/16/2022   History of Present Illness Pt is an 88 y/o female admitted from a memory care facility secondary to syncope, cough and abdominal pain. Pt found to have metabolic encephalopathy and septic shock. PMH including but not limited to hypertension, hyperlipidemia, GERD, depression with anxiety, Parkinson's disease, dementia, rheumatoid arthritis, recent admission due to COVID-19 infection on 2 L oxygen.    PT Comments    Pt found supine in bed upon PT entry. Pt alert, pleasant, and  able to follow simple commands for bed mobility.  Supine<>Sit with CGA. Sit<>Stand with min assist and RW. Pt required multiple verbal cues to not pull the RW. Pt ambulated 10 ft with RW and CGA. Pt was impulsive with decreased safety awareness, but able to follow PT cueing with keeping her feet in the walker and not sitting back down. Pt would benefit from skilled physical therapy to address listed deficits (see below) to increase functionality and safety. Current recommendation is SNF.    Recommendations for follow up therapy are one component of a multi-disciplinary discharge planning process, led by the attending physician.  Recommendations may be updated based on patient status, additional functional criteria and insurance authorization.  Follow Up Recommendations  Skilled nursing-short term rehab (<3 hours/day) Can patient physically be transported by private vehicle: Yes   Assistance Recommended at Discharge Frequent or constant Supervision/Assistance  Patient can return home with the following A little help with walking and/or transfers;A little help with bathing/dressing/bathroom;Assistance with cooking/housework;Direct supervision/assist for financial management;Assistance with feeding;Direct supervision/assist for medications management;Help with stairs or ramp for entrance;Assist for  transportation   Equipment Recommendations  None recommended by PT    Recommendations for Other Services       Precautions / Restrictions Precautions Precautions: Fall Precaution Comments: Air cabin crew Restrictions Weight Bearing Restrictions: No     Mobility  Bed Mobility Overal bed mobility: Needs Assistance Bed Mobility: Supine to Sit     Supine to sit: HOB elevated, Min guard     General bed mobility comments: pt able to initiate movement with mod verbal cueing to square her hips at the EOB    Transfers Overall transfer level: Needs assistance Equipment used: Rolling walker (2 wheels) Transfers: Sit to/from Stand Sit to Stand: Min assist                Ambulation/Gait Ambulation/Gait assistance: Min guard Gait Distance (Feet): 10 Feet Assistive device: Rolling walker (2 wheels) Gait Pattern/deviations: Decreased step length - right, Decreased step length - left, Decreased stance time - right, Decreased stance time - left, Decreased stride length, Decreased dorsiflexion - right, Decreased dorsiflexion - left, Decreased weight shift to right, Decreased weight shift to left, Shuffle, Narrow base of support       General Gait Details: pt required mod verbal cues to keep her feet inside RW and had decreased safety awareness with RW   Stairs             Wheelchair Mobility    Modified Rankin (Stroke Patients Only)       Balance Overall balance assessment: Needs assistance Sitting-balance support: Feet supported Sitting balance-Leahy Scale: Fair     Standing balance support: Bilateral upper extremity supported Standing balance-Leahy Scale: Fair                              Cognition Arousal/Alertness: Awake/alert  Behavior During Therapy: Impulsive, WFL for tasks assessed/performed Overall Cognitive Status: History of cognitive impairments - at baseline Area of Impairment: Orientation, Attention, Memory, Following commands,  Safety/judgement, Problem solving                 Orientation Level: Situation   Memory: Decreased short-term memory Following Commands: Follows one step commands inconsistently, Follows one step commands with increased time Safety/Judgement: Decreased awareness of safety, Decreased awareness of deficits   Problem Solving: Slow processing, Requires verbal cues, Requires tactile cues          Exercises General Exercises - Lower Extremity Hip ABduction/ADduction: AROM, Both, 10 reps, Strengthening Straight Leg Raises: AROM, Strengthening, Both, 10 reps    General Comments General comments (skin integrity, edema, etc.): SpO2 maintained >90% on 4L O2 via [place]      Pertinent Vitals/Pain Pain Assessment Pain Assessment: No/denies pain Faces Pain Scale: No hurt    Home Living Family/patient expects to be discharged to:: Other (Comment)                   Additional Comments: per chart review, pt living in a memory care facility    Prior Function            PT Goals (current goals can now be found in the care plan section) Acute Rehab PT Goals Patient Stated Goal: unable to state a goal PT Goal Formulation: Patient unable to participate in goal setting Time For Goal Achievement: 10/29/22 Potential to Achieve Goals: Fair Progress towards PT goals: Progressing toward goals    Frequency    Min 2X/week      PT Plan Current plan remains appropriate    Co-evaluation              AM-PAC PT "6 Clicks" Mobility   Outcome Measure  Help needed turning from your back to your side while in a flat bed without using bedrails?: None Help needed moving from lying on your back to sitting on the side of a flat bed without using bedrails?: None Help needed moving to and from a bed to a chair (including a wheelchair)?: A Little Help needed standing up from a chair using your arms (e.g., wheelchair or bedside chair)?: A Little Help needed to walk in hospital room?:  A Little Help needed climbing 3-5 steps with a railing? : A Lot 6 Click Score: 19    End of Session Equipment Utilized During Treatment: Oxygen;Gait belt Activity Tolerance: Patient tolerated treatment well Patient left: in chair;with nursing/sitter in room Nurse Communication: Mobility status PT Visit Diagnosis: Other abnormalities of gait and mobility (R26.89)     Time: 1450-1510 PT Time Calculation (min) (ACUTE ONLY): 20 min  Charges:  $Gait Training: 8-22 mins                        Nivaan Dicenzo O'Daniel,SPT 10/16/2022, 4:58 PM

## 2022-10-17 DIAGNOSIS — Z7189 Other specified counseling: Secondary | ICD-10-CM | POA: Diagnosis not present

## 2022-10-17 DIAGNOSIS — R55 Syncope and collapse: Secondary | ICD-10-CM | POA: Diagnosis not present

## 2022-10-17 DIAGNOSIS — Z515 Encounter for palliative care: Secondary | ICD-10-CM | POA: Diagnosis not present

## 2022-10-17 DIAGNOSIS — Z66 Do not resuscitate: Secondary | ICD-10-CM | POA: Diagnosis not present

## 2022-10-17 DIAGNOSIS — R6520 Severe sepsis without septic shock: Secondary | ICD-10-CM | POA: Diagnosis not present

## 2022-10-17 DIAGNOSIS — A419 Sepsis, unspecified organism: Secondary | ICD-10-CM | POA: Diagnosis not present

## 2022-10-17 LAB — CBC WITH DIFFERENTIAL/PLATELET
Abs Immature Granulocytes: 0.27 10*3/uL — ABNORMAL HIGH (ref 0.00–0.07)
Basophils Absolute: 0 10*3/uL (ref 0.0–0.1)
Basophils Relative: 0 %
Eosinophils Absolute: 0.3 10*3/uL (ref 0.0–0.5)
Eosinophils Relative: 2 %
HCT: 28.3 % — ABNORMAL LOW (ref 36.0–46.0)
Hemoglobin: 8.9 g/dL — ABNORMAL LOW (ref 12.0–15.0)
Immature Granulocytes: 2 %
Lymphocytes Relative: 12 %
Lymphs Abs: 1.6 10*3/uL (ref 0.7–4.0)
MCH: 32 pg (ref 26.0–34.0)
MCHC: 31.4 g/dL (ref 30.0–36.0)
MCV: 101.8 fL — ABNORMAL HIGH (ref 80.0–100.0)
Monocytes Absolute: 1.2 10*3/uL — ABNORMAL HIGH (ref 0.1–1.0)
Monocytes Relative: 9 %
Neutro Abs: 9.6 10*3/uL — ABNORMAL HIGH (ref 1.7–7.7)
Neutrophils Relative %: 75 %
Platelets: 208 10*3/uL (ref 150–400)
RBC: 2.78 MIL/uL — ABNORMAL LOW (ref 3.87–5.11)
RDW: 14.6 % (ref 11.5–15.5)
WBC: 13.1 10*3/uL — ABNORMAL HIGH (ref 4.0–10.5)
nRBC: 0.2 % (ref 0.0–0.2)

## 2022-10-17 NOTE — Progress Notes (Signed)
  Daily Progress Note   Patient Name: Morgan Dennis       Date: 10/17/2022 DOB: 1934-01-15  Age: 88 y.o. MRN#: [id number] Attending Physician: Shawna Clamp, MD Primary Care Physician: Jerrol Banana., MD Admit Date: 10/07/2022 Length of Stay: 10 days  Reason for Consultation/Follow-up: {Reason for Consult:23484}  HPI/Patient Profile:  ***  Subjective:   Subjective: Chart Reviewed. Updates received. Patient Assessed. Created space and opportunity for patient  and family to explore thoughts and feelings regarding current medical situation.  Today's Discussion: ***  Review of Systems  Objective:   Vital Signs:  BP (!) 133/59 (BP Location: Right Arm)   Pulse 78   Temp 98.4 F (36.9 C)   Resp 18   Ht '5\' 4"'$  (1.626 m)   Wt 80.4 kg   SpO2 93%   BMI 30.42 kg/m   Physical Exam: Physical Exam  Palliative Assessment/Data: ***    Existing Vynca/ACP Documentation: ***  Assessment & Plan:   Impression: Present on Admission:  HCAP (healthcare-associated pneumonia)  Severe sepsis (HCC)  Acute colitis  Dementia without behavioral disturbance (HCC)  Essential hypertension  Depression with anxiety  Parkinson's disease  Rheumatoid arthritis (HCC)  Myocardial injury  AKI (acute kidney injury) ([place])  Syncope  Severe sepsis with acute organ dysfunction (HCC)  ***  SUMMARY OF RECOMMENDATIONS   ***  Symptom Management:  ***  Code Status: {Palliative Code status:23503}  Prognosis: {Palliative Care Prognosis:23504}  Discharge Planning: {Palliative dispostion:23505}  Discussed with: ***  Thank you for allowing [place] to participate in the care of SHAQUAYLA KLIMAS PMT will continue to support holistically.  Time Total: ***  Visit consisted of counseling and education dealing with the complex and emotionally intense issues of symptom management and palliative care in the setting of serious and potentially life-threatening illness. Greater than 50%  of this time was  spent counseling and coordinating care related to the above assessment and plan.  Walden Field, NP Palliative Medicine Team  Team Phone # [phone] (Nights/Weekends)  08/16/2021, 8:17 AM

## 2022-10-17 NOTE — Progress Notes (Signed)
PROGRESS NOTE    Morgan Dennis  QQI:[id number] DOB: Sep 25, 1934 DOA: 10/07/2022  PCP: Jerrol Banana., MD   Brief Narrative: This 88 years old female with PMH significant for hypertension, hyperlipidemia, dementia, Parkinson disease, anxiety, depression, rheumatoid arthritis and recent COVID-19 infection presented in the ED from memory care center with chief complaint of syncope and hypotension.  Patient had witnessed syncopal episode in memory unit and was found to be hypotensive.  She was recently hospitalized and was treated with paxloid  and steroids for COVID-19 infection.  She was discharged on 2 L of supplemental oxygen.  Patient was found to be septic and started empirically on antibiotics due to suspected colitis and possible HCAP versus aspiration pneumonia.  Patient was hypotensive requiring pressor support and admitted in ICU. TRH pick up 10/11/22. She is back to baseline, awaiting SNF placement.  Assessment & Plan:   Principal Problem:   Severe sepsis ([place]) Active Problems:   Acute colitis   HCAP (healthcare-associated pneumonia)   Myocardial injury   AKI (acute kidney injury) ([place])   Essential hypertension   Parkinson's disease   Rheumatoid arthritis ([place])   Syncope   Dementia without behavioral disturbance ([place])   Depression with anxiety   Severe sepsis with acute organ dysfunction (HCC)  Acute metabolic encephalopathy > multifactorial. CT head no acute abnormality.  Avoid sedating medications. Could be due to dementia, Parkinson's disease. Continue Sinemet, Namenda and Aricept for dementia Mental status improved, seems at her baseline.  Acute on chronic hypoxic respiratory failure: Acute bilateral pulmonary embolism: Family does not want any aggressive intervention. Continue supplemental oxygen and wean as tolerated.  She weaned down to 4L of supplemental oxygen. Patient was started on anticoagulation but DOAC will need to be discussed prior to discharge  given risk of falls with her dementia and Parkinson's disease.   Initiated on heparin infusion, discontinued 10/26 since patient coughed up blood. Heparin resumed.   Spoke with son Grady Lucci in detail, given risk of falls with dementia and Parkinson's disease. He is not in favor of anti-coagulation, anti-coagulation discontinued.  Son is understandable,  states his father died because of ICH due to being on anticoagulation after fall.  AKI likely prerenal due to ATN. Renal functions improved.  Thrombocytopenia:> Resolved. Continue to monitor platelet count while on heparin. Platelets improved to 85> 119> 142>177>220  Septic shock secondary to Suspected colitis/ HCAP: Patient was hypotensive requiring Levophed support. She was started on stress dose steroids. Patient reported having abdominal pain. CT abdomen shows possible colitis. Initiated on broad-spectrum antibiotics aztreonam and metronidazole Blood and urine cultures no growth so far. Antibiotics discontinued after 6 days.  WBC trended up 20K Levaquin restarted given high WBC count and risk for aspiration. WBC trending down 13.1. Solucortef tapered off and discontinued. Antibiotic discontinued.  Septic shock resolved.  Goals of care discussion Discussed with son at bedside.  Explained in detail about the poor prognosis. Son states that he does not want her to be aggressively managed. If there is decline from current situation they are agreeable to transition to comfort care. Anticoagulation discontinued after long discussion about risks and benefit. Patient is now medically clear, awaiting SNF placement   DVT prophylaxis:SCDs Code Status: DNR Family Communication: Spoke with son at bedside. Disposition Plan:   Status is: Inpatient Remains inpatient appropriate because: Admitted for acute metabolic encephalopathy and septic shock requiring Levophed support.  She is also found to have pulmonary embolism requiring  anticoagulation.  Anticoagulation discontinued after long  discussion with family.  Palliative care consulted to discuss goals of care.  PT/OT: SNF, pending insurance authorization.   Consultants:  PCCM  Procedures: None  Antimicrobials: Anti-infectives (From admission, onward)    Start     Dose/Rate Route Frequency Ordered Stop   10/13/22 1230  levofloxacin (LEVAQUIN) IVPB 750 mg        750 mg 100 mL/hr over 90 Minutes Intravenous Daily 10/13/22 1138     10/10/22 0100  vancomycin (VANCOREADY) IVPB 750 mg/150 mL  Status:  Discontinued       See Hyperspace for full Linked Orders Report.   750 mg 150 mL/hr over 60 Minutes Intravenous Every 24 hours 10/09/22 0009 10/09/22 1017   10/09/22 0100  vancomycin (VANCOREADY) IVPB 1500 mg/300 mL       See Hyperspace for full Linked Orders Report.   1,500 mg 150 mL/hr over 120 Minutes Intravenous  Once 10/09/22 0009 10/09/22 0247   10/08/22 0400  metroNIDAZOLE (FLAGYL) IVPB 500 mg  Status:  Discontinued        500 mg 100 mL/hr over 60 Minutes Intravenous Every 12 hours 10/07/22 1823 10/12/22 1228   10/07/22 2200  aztreonam (AZACTAM) 1 g in sodium chloride 0.9 % 100 mL IVPB  Status:  Discontinued        1 g 200 mL/hr over 30 Minutes Intravenous Every 8 hours 10/07/22 1859 10/12/22 1228   10/07/22 1902  vancomycin variable dose per unstable renal function (pharmacist dosing)  Status:  Discontinued         Does not apply See admin instructions 10/07/22 1906 10/09/22 0012   10/07/22 1845  vancomycin (VANCOREADY) IVPB 2000 mg/400 mL        2,000 mg 200 mL/hr over 120 Minutes Intravenous  Once 10/07/22 1841 10/07/22 2358   10/07/22 1615  aztreonam (AZACTAM) 2 g in sodium chloride 0.9 % 100 mL IVPB  Status:  Discontinued        2 g 200 mL/hr over 30 Minutes Intravenous Every 8 hours 10/07/22 1603 10/07/22 1859   10/07/22 1615  vancomycin (VANCOCIN) IVPB 1000 mg/200 mL premix  Status:  Discontinued        1,000 mg 200 mL/hr over 60 Minutes  Intravenous  Once 10/07/22 1603 10/07/22 1841   10/07/22 1615  metroNIDAZOLE (FLAGYL) IVPB 500 mg        500 mg 100 mL/hr over 60 Minutes Intravenous  Once 10/07/22 1603 10/07/22 1848       Subjective: Patient was seen and examined at bedside.  Overnight events noted. Patient appears chronically ill looking, very deconditioned, alert and following commands. She has tolerated mechanical soft diet.  She is awaiting placement in SNF pending insurance Auth.  Objective: Vitals:   10/16/22 0505 10/16/22 2017 10/17/22 0550 10/17/22 0856  BP: 135/61 (!) 152/69 (!) 118/54 (!) 133/59  Pulse: 75 80 78 78  Resp: '18 17 18 18  '$ Temp: 97.7 F (36.5 C) 97.6 F (36.4 C) (!) 97.5 F (36.4 C) 98.4 F (36.9 C)  TempSrc: Oral Oral Oral   SpO2: 99% 100% 95% 93%  Weight:      Height:        Intake/Output Summary (Last 24 hours) at 10/17/2022 1208 Last data filed at 10/17/2022 1005 Gross per 24 hour  Intake 120 ml  Output 2400 ml  Net -2280 ml   Filed Weights   10/07/22 1800 10/08/22 0037  Weight: 84.9 kg 80.4 kg    Examination:  General exam: Appears comfortable, chronically  ill looking, very deconditioned, not in any distress. Respiratory system: CTA bilaterally, respiratory effort normal, RR 15 Cardiovascular system: S1 & S2 heard, regular rate rhythm, no murmur. Gastrointestinal system: Abdomen is soft, non tender, non distended, BS+ Central nervous system: Alert and oriented x 2, no focal neurological deficits. Extremities: No edema, no cyanosis, no clubbing Skin: No rashes, lesions or ulcers Psychiatry: Mood & affect appropriate.     Data Reviewed: I have personally reviewed following labs and imaging studies  CBC: Recent Labs  Lab 10/13/22 0330 10/14/22 0521 10/15/22 0815 10/16/22 0550 10/17/22 0614  WBC 18.4* 18.2* 16.2* 14.9* 13.1*  NEUTROABS 13.6* 11.9* 9.8* 9.7* 9.6*  HGB 9.5* 9.9* 9.7* 8.9* 8.9*  HCT 29.1* 30.8* 29.9* 28.0* 28.3*  MCV 99.3 102.7* 99.3 102.2*  101.8*  PLT 142* 177 220 203 176   Basic Metabolic Panel: Recent Labs  Lab 10/10/22 2035 10/11/22 0335 10/12/22 0446 10/13/22 0330 10/14/22 0521 10/16/22 0550  NA  --  141 142  --  141 141  K 3.6 3.5 3.3*  --  4.3 3.8  CL  --  111 111  --  110 105  CO2  --  25 24  --  27 29  GLUCOSE  --  115* 119*  --  113* 108*  BUN  --  23 16  --  13 10  CREATININE  --  0.58 0.54  --  0.53 0.61  CALCIUM  --  7.4* 7.2*  --  7.7* 8.0*  MG  --  2.2 2.2 2.2 2.0 1.8  PHOS 1.6* 3.7 2.2* 1.8* 2.8 2.6   GFR: Estimated Creatinine Clearance: 52.8 mL/min (by C-G formula based on SCr of 0.61 mg/dL). Liver Function Tests: No results for input(s): "AST", "ALT", "ALKPHOS", "BILITOT", "PROT", "ALBUMIN" in the last 168 hours.  No results for input(s): "LIPASE", "AMYLASE" in the last 168 hours.  No results for input(s): "AMMONIA" in the last 168 hours. Coagulation Profile: No results for input(s): "INR", "PROTIME" in the last 168 hours. Cardiac Enzymes: No results for input(s): "CKTOTAL", "CKMB", "CKMBINDEX", "TROPONINI" in the last 168 hours. BNP (last 3 results) No results for input(s): "PROBNP" in the last 8760 hours. HbA1C: No results for input(s): "HGBA1C" in the last 72 hours. CBG: Recent Labs  Lab 10/14/22 1206 10/14/22 1601 10/14/22 2027 10/15/22 0035 10/15/22 0847  GLUCAP 178* 118* 94 107* 71   Lipid Profile: No results for input(s): "CHOL", "HDL", "LDLCALC", "TRIG", "CHOLHDL", "LDLDIRECT" in the last 72 hours. Thyroid Function Tests: No results for input(s): "TSH", "T4TOTAL", "FREET4", "T3FREE", "THYROIDAB" in the last 72 hours. Anemia Panel: No results for input(s): "VITAMINB12", "FOLATE", "FERRITIN", "TIBC", "IRON", "RETICCTPCT" in the last 72 hours. Sepsis Labs: No results for input(s): "PROCALCITON", "LATICACIDVEN" in the last 168 hours.   Recent Results (from the past 240 hour(s))  Resp Panel by RT-PCR (Flu A&B, Covid) Anterior Nasal Swab     Status: None   Collection Time:  10/07/22  2:08 PM   Specimen: Anterior Nasal Swab  Result Value Ref Range Status   SARS Coronavirus 2 by RT PCR NEGATIVE NEGATIVE Final    Comment: (NOTE) SARS-CoV-2 target nucleic acids are NOT DETECTED.  The SARS-CoV-2 RNA is generally detectable in upper respiratory specimens during the acute phase of infection. The lowest concentration of SARS-CoV-2 viral copies this assay can detect is 138 copies/mL. A negative result does not preclude SARS-Cov-2 infection and should not be used as the sole basis for treatment or other patient management decisions. A  negative result may occur with  improper specimen collection/handling, submission of specimen other than nasopharyngeal swab, presence of viral mutation(s) within the areas targeted by this assay, and inadequate number of viral copies(<138 copies/mL). A negative result must be combined with clinical observations, patient history, and epidemiological information. The expected result is Negative.  Fact Sheet for Patients:  EntrepreneurPulse.com.au  Fact Sheet for Healthcare Providers:  IncredibleEmployment.be  This test is no t yet approved or cleared by the [place] FDA and  has been authorized for detection and/or diagnosis of SARS-CoV-2 by FDA under an Emergency Use Authorization (EUA). This EUA will remain  in effect (meaning this test can be used) for the duration of the COVID-19 declaration under Section 564(b)(1) of the Act, 21 U.S.C.section 360bbb-3(b)(1), unless the authorization is terminated  or revoked sooner.       Influenza A by PCR NEGATIVE NEGATIVE Final   Influenza B by PCR NEGATIVE NEGATIVE Final    Comment: (NOTE) The Xpert Xpress SARS-CoV-2/FLU/RSV plus assay is intended as an aid in the diagnosis of influenza from Nasopharyngeal swab specimens and should not be used as a sole basis for treatment. Nasal washings and aspirates are unacceptable for Xpert Xpress  SARS-CoV-2/FLU/RSV testing.  Fact Sheet for Patients: EntrepreneurPulse.com.au  Fact Sheet for Healthcare Providers: IncredibleEmployment.be  This test is not yet approved or cleared by the [place] FDA and has been authorized for detection and/or diagnosis of SARS-CoV-2 by FDA under an Emergency Use Authorization (EUA). This EUA will remain in effect (meaning this test can be used) for the duration of the COVID-19 declaration under Section 564(b)(1) of the Act, 21 U.S.C. section 360bbb-3(b)(1), unless the authorization is terminated or revoked.  Performed at [facility], [address] [place], [place] [address]   Culture, blood (routine x 2)     Status: None   Collection Time: 10/07/22  4:00 PM   Specimen: BLOOD  Result Value Ref Range Status   Specimen Description BLOOD BLOOD LEFT ARM  Final   Special Requests   Final    BOTTLES DRAWN AEROBIC AND ANAEROBIC Blood Culture adequate volume   Culture   Final    NO GROWTH 5 DAYS Performed at [facility], [address], [place], [place] [address]    Report Status 10/12/2022 FINAL  Final  Urine Culture     Status: Abnormal   Collection Time: 10/07/22  4:45 PM   Specimen: Urine, Random  Result Value Ref Range Status   Specimen Description   Final    URINE, RANDOM Performed at [facility], [address], [place], [place] [address]    Special Requests   Final    NONE Performed at [facility], [address] [place], [place] [address]    Culture 30,000 COLONIES/mL ESCHERICHIA COLI (A)  Final   Report Status 10/10/2022 FINAL  Final   Organism ID, Bacteria ESCHERICHIA COLI (A)  Final      Susceptibility   Escherichia coli - MIC*    AMPICILLIN 4 SENSITIVE Sensitive     CEFAZOLIN <=4 SENSITIVE Sensitive     CEFEPIME <=0.12 SENSITIVE Sensitive     CEFTRIAXONE <=0.25 SENSITIVE Sensitive     CIPROFLOXACIN <=0.25 SENSITIVE Sensitive      GENTAMICIN <=1 SENSITIVE Sensitive     IMIPENEM <=0.25 SENSITIVE Sensitive     NITROFURANTOIN <=16 SENSITIVE Sensitive     TRIMETH/SULFA <=20 SENSITIVE Sensitive     AMPICILLIN/SULBACTAM <=2 SENSITIVE Sensitive     PIP/TAZO <=  4 SENSITIVE Sensitive     * 30,000 COLONIES/mL ESCHERICHIA COLI  Culture, blood (routine x 2)     Status: None   Collection Time: 10/07/22  8:37 PM   Specimen: BLOOD RIGHT HAND  Result Value Ref Range Status   Specimen Description BLOOD RIGHT HAND  Final   Special Requests   Final    BOTTLES DRAWN AEROBIC ONLY Blood Culture adequate volume   Culture   Final    NO GROWTH 5 DAYS Performed at [facility], [address], [place], [place] [address]    Report Status 10/12/2022 FINAL  Final  MRSA Next Gen by PCR, Nasal     Status: None   Collection Time: 10/08/22 12:35 AM   Specimen: Nasal Mucosa; Nasal Swab  Result Value Ref Range Status   MRSA by PCR Next Gen NOT DETECTED NOT DETECTED Final    Comment: (NOTE) The GeneXpert MRSA Assay (FDA approved for NASAL specimens only), is one component of a comprehensive MRSA colonization surveillance program. It is not intended to diagnose MRSA infection nor to guide or monitor treatment for MRSA infections. Test performance is not FDA approved in patients less than 75 years old. Performed at [facility], [address] [place], [place] [address]     Radiology Studies: No results found.  Scheduled Meds:  aspirin EC  81 mg Oral Daily   atorvastatin  20 mg Oral QHS   carbidopa-levodopa  2 tablet Oral TID   carbidopa-levodopa  2 tablet Oral QHS   And   entacapone  200 mg Oral QHS   donepezil  10 mg Oral QHS   DULoxetine  60 mg Oral Daily   famotidine  20 mg Oral QHS   folic acid  1 mg Oral Daily   melatonin  5 mg Oral QHS   memantine  10 mg Oral BID   metoprolol tartrate  25 mg Oral BID   multivitamin with minerals  1 tablet Oral Daily   nystatin  5 mL Oral QID   pregabalin  150 mg Oral  QHS   vitamin E  400 Units Oral Daily   Continuous Infusions:  sodium chloride Stopped (10/14/22 1210)   levofloxacin (LEVAQUIN) IV 750 mg (10/17/22 0947)     LOS: 10 days    Time spent: 35 mins    Jenese Mischke, MD [facility]   If 7PM-7AM, please contact night-coverage

## 2022-10-17 NOTE — TOC Progression Note (Addendum)
Transition of Care [facility]) - Progression Note    Patient Details  Name: AMOUR TRIGG MRN: [id number] Date of Birth: 12/09/1934  Transition of Care [facility]) CM/SW Contact  Laurena Slimmer, RN Phone Number: 10/17/2022, 11:30 AM  Clinical Narrative:     Per [place] PASSR patient issued a PASSR 10/30/20203 #[id number] E valid 10/16/2022-11/15/2022.  Attempt to contact patient's son, [name] son to give bed offer for [facility]. No answer. Left a message.   Retrieved call from patient's son. Mitch not agreeable to [facility]. He will call [place] to follow up.    Expected Discharge Plan: [place] Barriers to Discharge: Continued Medical Work up  Expected Discharge Plan and Services Expected Discharge Plan: [place]   Discharge Planning Services: CM Consult Post Acute Care Choice: [place] Living arrangements for the past 2 months: [place]                 DME Arranged: N/A DME Agency: NA                   Social Determinants of Health (SDOH) Interventions    Readmission Risk Interventions     No data to display

## 2022-10-18 ENCOUNTER — Inpatient Hospital Stay: Payer: Medicare HMO

## 2022-10-18 LAB — CBC WITH DIFFERENTIAL/PLATELET
Abs Immature Granulocytes: 0.22 10*3/uL — ABNORMAL HIGH (ref 0.00–0.07)
Basophils Absolute: 0.1 10*3/uL (ref 0.0–0.1)
Basophils Relative: 1 %
Eosinophils Absolute: 0.3 10*3/uL (ref 0.0–0.5)
Eosinophils Relative: 2 %
HCT: 33.6 % — ABNORMAL LOW (ref 36.0–46.0)
Hemoglobin: 10.8 g/dL — ABNORMAL LOW (ref 12.0–15.0)
Immature Granulocytes: 2 %
Lymphocytes Relative: 13 %
Lymphs Abs: 1.9 10*3/uL (ref 0.7–4.0)
MCH: 32.3 pg (ref 26.0–34.0)
MCHC: 32.1 g/dL (ref 30.0–36.0)
MCV: 100.6 fL — ABNORMAL HIGH (ref 80.0–100.0)
Monocytes Absolute: 1.6 10*3/uL — ABNORMAL HIGH (ref 0.1–1.0)
Monocytes Relative: 11 %
Neutro Abs: 10.2 10*3/uL — ABNORMAL HIGH (ref 1.7–7.7)
Neutrophils Relative %: 71 %
Platelets: 218 10*3/uL (ref 150–400)
RBC: 3.34 MIL/uL — ABNORMAL LOW (ref 3.87–5.11)
RDW: 14.9 % (ref 11.5–15.5)
WBC: 14.3 10*3/uL — ABNORMAL HIGH (ref 4.0–10.5)
nRBC: 0.1 % (ref 0.0–0.2)

## 2022-10-18 NOTE — TOC Progression Note (Addendum)
Transition of Care [facility]) - Progression Note    Patient Details  Name: Morgan Dennis MRN: [id number] Date of Birth: November 17, 1934  Transition of Care [facility]) CM/SW Contact  Laurena Slimmer, RN Phone Number: 10/18/2022, 9:21 AM  Clinical Narrative:    Attempt to reach patient's son to discuss discharge plan. No answer. Left a message.   Retreived call from patient's son, [name]. He confirmed family would prefer [facility].   Contacted TOC assistant Dannette Barbara to start [employer].   11:23pm Auth pending per [facility] #Pending cert: [id number]     Expected Discharge Plan: Skilled Nursing Facility Barriers to Discharge: Continued Medical Work up  Expected Discharge Plan and Services Expected Discharge Plan: [place]   Discharge Planning Services: CM Consult Post Acute Care Choice: [place] Living arrangements for the past 2 months: [place]                 DME Arranged: N/A DME Agency: NA                   Social Determinants of Health (SDOH) Interventions    Readmission Risk Interventions     No data to display

## 2022-10-18 NOTE — Progress Notes (Addendum)
Speech Language Pathology Treatment: Dysphagia  Patient Details Name: Morgan Dennis MRN: [id number] DOB: 1934-05-03 Today's Date: 10/18/2022 Time: 9470-9628 SLP Time Calculation (min) (ACUTE ONLY): 45 min  Assessment / Plan / Recommendation Clinical Impression  Pt seen for reassessment of swallowing per MD request; pt had a recent incline in WBC number and MD wanted to be sure there was no aspiration concern ongoing. Pt awakened easily and was eager to have something to eat/drink as a "treat" -- she likes Chocolate milk.  She was verbal and engaged easily w/ this Clinician. She followed instructions w/ cues -- Baseline Dementia and Cognitive decline present.  On [place] O2 support - 4L(on O2 baseline per chart); Afebrile.   Pt appears to present w/ grossly adequate oropharyngeal phase swallow function w/ No overt oropharyngeal phase dysphagia noted, No neuromuscular deficits impacting swallowing noted. Pt consumed po trials w/ No overt, clinical s/s of aspiration during po trials. Pt appears at reduced risk for aspiration following general aspiration precautions.  However, pt does have challenging factors that could impact her oropharyngeal swallowing to include fatigue/weakness, Baseline Cognitive decline/Dementia, and advanced age as well as current issue of acute illness; these issues can increase risk for aspiration, dysphagia as well as decreased oral intake overall.  Pt has a similar presentation as at last visit/tx session w/ no apparent decline/change in swallowing status.    Pt required Support and Cues for sitting Upright for oral intake -- verbal instructions on this and drinking Slowly using Small sips given. Pt followed these instructions w/ Cues. During the po trials, pt consumed all consistencies w/ no overt coughing, decline in vocal quality, or change in respiratory presentation during/post trials. O2 sats remained 98-99%. Oral phase appeared grossly [facility] w/ timely bolus management,  mastication, and control of bolus propulsion for A-P transfer for swallowing. Oral clearing and mastication achieved w/ all trial consistencies -- moistened, soft foods given for ease of mastication.  Pt fed self w/ setup support.    Recommend continue a more mech soft consistency diet w/ well-Cut meats, moistened foods; Thin liquids -- monitor straw use. Recommend general aspiration precautions, Pills WHOLE vs Crushed in Puree for safer, easier swallowing - per NSG discretion. This was encouraged for Discharge as well in chart notes to [place].  Education given on Pills in Puree; food consistencies and easy to eat options; general aspiration precautions to pt and NSG staff. Precautions posted in room. NSG to reconsult if any new needs arise. NSG updated, agreed. MD updated. Recommend Dietician f/u for support.      HPI HPI: Per H&P: Morgan Dennis is a 88 y.o. female with medical history significant of HOH, hypertension, hyperlipidemia, GERD, depression with anxiety, Parkinson's disease, Dementia, rheumatoid arthritis, recent admission due to COVID-19 infection on 2 L oxygen, who presents with syncope, cough, abdominal pain. CT Head 10/08/22: No acute intracranial pathology. Mild age-related atrophy and chronic microvascular ischemic changes. CXR 10/08/22: Right internal jugular central venous catheter in appropriate position. No pneumothorax. Pulmonary hypoinflation. Pt with family in the room who reported that pt was on an unrestricted diet at baseline. O2 via nasal canula. Pt currently on puree solids and thin liquids since initial eval and tolerating well per NSG.      SLP Plan  All goals met      Recommendations for follow up therapy are one component of a multi-disciplinary discharge planning process, led by the attending physician.  Recommendations may be updated based on patient status, additional functional criteria  and insurance authorization.    Recommendations  Diet recommendations:  Dysphagia 3 (mechanical soft);Thin liquid (for ease of diet) Liquids provided via: Cup (monitor any straw use) Medication Administration: Crushed with puree (vs Whole in Puree) Supervision: Patient able to self feed;Staff to assist with self feeding;Intermittent supervision to cue for compensatory strategies Compensations: Minimize environmental distractions;Slow rate;Small sips/bites;Lingual sweep for clearance of pocketing;Follow solids with liquid Postural Changes and/or Swallow Maneuvers: Out of bed for meals;Seated upright 90 degrees;Upright 30-60 min after meal                General recommendations:  (Dietician f/u) Oral Care Recommendations: Oral care BID;Oral care before and after PO;Staff/trained caregiver to provide oral care (support pt) Follow Up Recommendations: No SLP follow up Assistance recommended at discharge: Intermittent Supervision/Assistance (w/ meals) SLP Visit Diagnosis: Dysphagia, unspecified (R13.10) (baseline Dementia; Cognitive decline) Plan: All goals met            Orinda Kenner, MS, CCC-SLP Speech Language Pathologist Rehab Services; [place] [phone] (ascom) Marciana Uplinger  10/18/2022, 2:41 PM

## 2022-10-18 NOTE — Progress Notes (Signed)
Occupational Therapy Treatment Patient Details Name: Morgan Dennis MRN: [id number] DOB: 31-May-1934 Today's Date: 10/18/2022   History of present illness Pt is an 88 y/o female admitted from a memory care facility secondary to syncope, cough and abdominal pain. Pt found to have metabolic encephalopathy and septic shock. PMH including but not limited to hypertension, hyperlipidemia, GERD, depression with anxiety, Parkinson's disease, dementia, rheumatoid arthritis, recent admission due to COVID-19 infection on 2 L oxygen.   OT comments  Patient in bed upon arrival and agreeable to OT treatment. Patient confused upon arrival, A&Ox3, unsure about lunch tray, requiring frequent redirection. Patient was able to perform bed mobility supine<>sit with min guard. While sitting EOB patient perform UB dressing with set up, refused LB dressing stating "I'll change them tomorrow". Performed sit<> stand x3 with min A using RW. Patient left in bed with call bell in reach, lunch tray set up, bed alarm set, and all needs met.    Recommendations for follow up therapy are one component of a multi-disciplinary discharge planning process, led by the attending physician.  Recommendations may be updated based on patient status, additional functional criteria and insurance authorization.    Follow Up Recommendations  Skilled nursing-short term rehab (<3 hours/day)    Assistance Recommended at Discharge Frequent or constant Supervision/Assistance  Patient can return home with the following  Assist for transportation;Help with stairs or ramp for entrance;Assistance with cooking/housework;Direct supervision/assist for medications management;A little help with walking and/or transfers;A little help with bathing/dressing/bathroom   Equipment Recommendations  Other (comment) (Defer to next venue of care.)       Precautions / Restrictions Precautions Precautions: Fall Restrictions Weight Bearing Restrictions: No        Mobility Bed Mobility Overal bed mobility: Needs Assistance Bed Mobility: Sit to Supine, Supine to Sit     Supine to sit: Min guard, HOB elevated Sit to supine: Min guard        Transfers Overall transfer level: Needs assistance Equipment used: Rolling walker (2 wheels) Transfers: Sit to/from Stand Sit to Stand: Min assist                 Balance Overall balance assessment: Needs assistance Sitting-balance support: Feet supported Sitting balance-Leahy Scale: Fair     Standing balance support: Bilateral upper extremity supported Standing balance-Leahy Scale: Fair                             ADL either performed or assessed with clinical judgement   ADL                   Upper Body Dressing : Set up     Lower Body Dressing Details (indicate cue type and reason): refused                    Extremity/Trunk Assessment Upper Extremity Assessment Upper Extremity Assessment: Generalized weakness   Lower Extremity Assessment Lower Extremity Assessment: Generalized weakness   Cervical / Trunk Assessment Cervical / Trunk Assessment: Kyphotic    Vision Patient Visual Report: No change from baseline            Cognition Arousal/Alertness: Awake/alert Behavior During Therapy: WFL for tasks assessed/performed Overall Cognitive Status: No family/caregiver present to determine baseline cognitive functioning                   Orientation Level: Person, Place, Situation  Pertinent Vitals/ Pain       Pain Assessment Pain Assessment: No/denies pain   Frequency  Min 2X/week        Progress Toward Goals  OT Goals(current goals can now be found in the care plan section)  Progress towards OT goals: Progressing toward goals  Acute Rehab OT Goals Patient Stated Goal: to get better OT Goal Formulation: With patient Time For Goal Achievement: 10/29/22 Potential to Achieve Goals:  [place] Discharge plan remains appropriate;Frequency remains appropriate       AM-PAC OT "6 Clicks" Daily Activity     Outcome Measure   Help from another person eating meals?: None Help from another person taking care of personal grooming?: A Little Help from another person toileting, which includes using toliet, bedpan, or urinal?: A Lot Help from another person bathing (including washing, rinsing, drying)?: A Lot Help from another person to put on and taking off regular upper body clothing?: A Little Help from another person to put on and taking off regular lower body clothing?: A Lot 6 Click Score: 16    End of Session Equipment Utilized During Treatment: Rolling walker (2 wheels);Oxygen  OT Visit Diagnosis: Unsteadiness on feet (R26.81);Muscle weakness (generalized) (M62.81);Other symptoms and signs involving cognitive function   Activity Tolerance Patient tolerated treatment well   Patient Left in bed;with call bell/phone within reach;with bed alarm set   Nurse Communication Mobility status        Time: 1444-1500 OT Time Calculation (min): 16 min  Charges: OT General Charges $OT Visit: 1 Visit OT Treatments $Therapeutic Activity: 8-22 mins    Azeez Dunker, OTS 10/18/2022, 3:34 PM

## 2022-10-18 NOTE — Progress Notes (Signed)
Consultation Progress Note   Patient: Morgan Dennis PZW:[id number] DOB: Mar 14, 1934 DOA: 10/07/2022 DOS: the patient was seen and examined on 10/18/2022 Primary service: Blayklee Mable, Manfred Shirts, MD  Brief hospital course: This 88 years old female with PMH significant for hypertension, hyperlipidemia, dementia, Parkinson disease, anxiety, depression, rheumatoid arthritis and recent COVID-19 infection presented in the ED from memory care center with chief complaint of syncope and hypotension.  Patient had witnessed syncopal episode in memory unit and was found to be hypotensive.  She was recently hospitalized and was treated with paxloid  and steroids for COVID-19 infection.  She was discharged on 2 L of supplemental oxygen.  Patient was found to be septic and started empirically on antibiotics due to suspected colitis and possible HCAP versus aspiration pneumonia.  Patient was hypotensive requiring pressor support and admitted in ICU. TRH pick up 10/11/22. She is back to baseline, awaiting SNF placement.   Assessment and Plan: Principal Problem:   Severe sepsis ([place]) Active Problems:   Acute colitis   HCAP (healthcare-associated pneumonia)   Myocardial injury   AKI (acute kidney injury) ([place])   Essential hypertension   Parkinson's disease   Rheumatoid arthritis ([place])   Syncope   Dementia without behavioral disturbance ([place])   Depression with anxiety   Severe sepsis with acute organ dysfunction (H[facility])   Acute metabolic encephalopathy > multifactorial. CT head no acute abnormality.  Avoid sedating medications. Could be due to dementia, Parkinson's disease. Continue Sinemet, Namenda and Aricept for dementia Mental status improved, seems at her baseline.   Acute on chronic hypoxic respiratory failure: Acute bilateral pulmonary embolism: Family does not want any aggressive intervention. Continue supplemental oxygen and wean as tolerated.  She weaned down to 4L of supplemental oxygen. Patient was  started on anticoagulation but DOAC will need to be discussed prior to discharge given risk of falls with her dementia and Parkinson's disease.   Initiated on heparin infusion, discontinued 10/26 since patient coughed up blood. Heparin resumed.   Spoke with son Trenesha Alcaide in detail, given risk of falls with dementia and Parkinson's disease. He is not in favor of anti-coagulation, anti-coagulation discontinued.  Son is understandable,  states his father died because of ICH due to being on anticoagulation after fall.   AKI likely prerenal due to ATN. Renal functions improved.   Thrombocytopenia:> Resolved. Continue to monitor platelet count while on heparin. Platelets improved to 85> 119> 142>177>220   Septic shock secondary to Suspected colitis/ HCAP: Patient was hypotensive requiring Levophed support. She was started on stress dose steroids. Patient reported having abdominal pain. CT abdomen shows possible colitis. Initiated on broad-spectrum antibiotics aztreonam and metronidazole Blood and urine cultures no growth so far. Antibiotics discontinued after 6 days.  WBC trended up 20K Levaquin restarted given high WBC count and risk for aspiration. WBC trending down 13.1. Solucortef tapered off and discontinued. Antibiotic discontinued.  Septic shock resolved. 11/1--WBC count at 14, concern for aspiration, would consult speech for eval and obtain CXR. Trend WBC count. She is not ready for dc yet.     DVT prophylaxis:SCDs Code Status: DNR Family Communication: Son at bedside Disposition Plan: SNF   Status is: Inpatient Remains inpatient appropriate because: Admitted for acute metabolic encephalopathy and septic shock requiring Levophed support.  She is also found to have pulmonary embolism requiring anticoagulation.  Anticoagulation discontinued after long discussion with family.  Palliative care consulted to discuss goals of care.   PT/OT: SNF, pending insurance authorization.  TRH will continue to follow the patient.  Subjective: No new complaints this morning.  Physical Exam:  General exam: Appears comfortable, chronically ill looking, very deconditioned, not in any distress. Respiratory system: CTA bilaterally, respiratory effort normal, RR 15 Cardiovascular system: S1 & S2 heard, regular rate rhythm, no murmur. Gastrointestinal system: Abdomen is soft, non tender, non distended, BS+ Central nervous system: Alert and oriented x 2, no focal neurological deficits. Extremities: No edema, no cyanosis, no clubbing Skin: No rashes, lesions or ulcers Psychiatry: Mood & affect appropriate.  Vitals:   10/17/22 1551 10/17/22 1952 10/18/22 0529 10/18/22 0845  BP: 134/75 (!) 157/80 131/62 119/67  Pulse: 71 74 73 69  Resp: '18 20 18 18  '$ Temp: 98.8 F (37.1 C) 98.5 F (36.9 C) (!) 97.2 F (36.2 C) 98.4 F (36.9 C)  TempSrc: Oral Oral  Oral  SpO2: 100% 99% 98% 99%  Weight:      Height:        Data Reviewed:  There are no new results to review at this time.  Family Communication: Son  Time spent: 15  minutes.  Author: Cristela Felt, MD 10/18/2022 11:13 AM  For on call review [email or web address].

## 2022-10-18 NOTE — Progress Notes (Signed)
PT Cancellation Note  Patient Details Name: Morgan Dennis MRN: [id number] DOB: [birth year]   Cancelled Treatment:    Reason Eval/Treat Not Completed: Patient declined, no reason specified;Fatigue/lethargy limiting ability to participate. Patient reports she is too tired and old, not feeling well. Despite encouragement patient declining all PT.    Kweli Grassel 10/18/2022, 12:01 PM

## 2022-10-18 NOTE — TOC Progression Note (Signed)
Transition of Care [facility]) - Progression Note    Patient Details  Name: Morgan Dennis MRN: [id number] Date of Birth: June 25, 1934  Transition of Care [facility]) CM/SW Contact  Laurena Slimmer, RN Phone Number: 10/18/2022, 9:08 AM  Clinical Narrative:    Spoke with Tammy in admission. Tammy extended a bed offer for patient.    Expected Discharge Plan: [place] Barriers to Discharge: Continued Medical Work up  Expected Discharge Plan and Services Expected Discharge Plan: [place]   Discharge Planning Services: CM Consult Post Acute Care Choice: [place] Living arrangements for the past 2 months: [name]                 DME Arranged: N/A DME Agency: NA                   Social Determinants of Health (SDOH) Interventions    Readmission Risk Interventions     No data to display

## 2022-10-19 DIAGNOSIS — I2699 Other pulmonary embolism without acute cor pulmonale: Secondary | ICD-10-CM | POA: Insufficient documentation

## 2022-10-19 DIAGNOSIS — G9341 Metabolic encephalopathy: Secondary | ICD-10-CM

## 2022-10-19 LAB — CBC WITH DIFFERENTIAL/PLATELET
Abs Immature Granulocytes: 0.09 10*3/uL — ABNORMAL HIGH (ref 0.00–0.07)
Basophils Absolute: 0 10*3/uL (ref 0.0–0.1)
Basophils Relative: 0 %
Eosinophils Absolute: 0.3 10*3/uL (ref 0.0–0.5)
Eosinophils Relative: 3 %
HCT: 28.9 % — ABNORMAL LOW (ref 36.0–46.0)
Hemoglobin: 9.3 g/dL — ABNORMAL LOW (ref 12.0–15.0)
Immature Granulocytes: 1 %
Lymphocytes Relative: 18 %
Lymphs Abs: 1.8 10*3/uL (ref 0.7–4.0)
MCH: 32.1 pg (ref 26.0–34.0)
MCHC: 32.2 g/dL (ref 30.0–36.0)
MCV: 99.7 fL (ref 80.0–100.0)
Monocytes Absolute: 1.3 10*3/uL — ABNORMAL HIGH (ref 0.1–1.0)
Monocytes Relative: 12 %
Neutro Abs: 6.7 10*3/uL (ref 1.7–7.7)
Neutrophils Relative %: 66 %
Platelets: 225 10*3/uL (ref 150–400)
RBC: 2.9 MIL/uL — ABNORMAL LOW (ref 3.87–5.11)
RDW: 15 % (ref 11.5–15.5)
WBC: 10.3 10*3/uL (ref 4.0–10.5)
nRBC: 0 % (ref 0.0–0.2)

## 2022-10-19 MED ORDER — ASPIRIN 81 MG PO CHEW
81.0000 mg | CHEWABLE_TABLET | Freq: Every day | ORAL | Status: DC
  Administered 2022-10-19 – 2022-10-20 (×2): 81 mg via ORAL
  Filled 2022-10-19 (×2): qty 1

## 2022-10-19 NOTE — TOC Progression Note (Signed)
Transition of Care [facility]) - Progression Note    Patient Details  Name: Morgan Dennis MRN: [id number] Date of Birth: Mar 15, 1934  Transition of Care [facility]) CM/SW Contact  Laurena Slimmer, RN Phone Number: 10/19/2022, 9:55 AM  Clinical Narrative:    Per [place] Message was received by Holland Falling regarding intent to deny, peer to peer options and appeals options for family.   Dr, Maylene Roes provided information from [facility] for intent to deny and offerings for peer to peer, and contact number for provider to call: [phone] opt 3. Deadline for peer to peer is today by 12pm. If patient/family want to do an expedited appeal, number is [phone].     Expected Discharge Plan: [place] Barriers to Discharge: Continued Medical Work up  Expected Discharge Plan and Services Expected Discharge Plan: [place]   Discharge Planning Services: CM Consult Post Acute Care Choice: [place] Living arrangements for the past 2 months: [place]                 DME Arranged: N/A DME Agency: NA                   Social Determinants of Health (SDOH) Interventions    Readmission Risk Interventions     No data to display

## 2022-10-19 NOTE — Progress Notes (Signed)
PROGRESS NOTE    Morgan Dennis  MEQ:[id number] DOB: [birth year] DOA: 10/07/2022 PCP: Jerrol Banana., MD     Brief Narrative:  Morgan Dennis is an 88 years old female with PMH significant for hypertension, hyperlipidemia, dementia, Parkinson disease, anxiety, depression, rheumatoid arthritis and recent COVID-19 infection presented in the ED from memory care center with chief complaint of syncope and hypotension.  Patient had witnessed syncopal episode in memory unit and was found to be hypotensive.  She was recently hospitalized and was treated with paxlovid and steroids for COVID-19 infection.  She was discharged on 2 L of supplemental oxygen.  Patient was found to be septic and started empirically on antibiotics due to suspected colitis and possible HCAP versus aspiration pneumonia.  Patient was hypotensive requiring pressor support and admitted in ICU. TRH pick up 10/11/22. She is back to baseline, awaiting SNF placement.    New events last 24 hours / Subjective: Patient remains at her baseline, she is alert and oriented to self and hospital today. Was notified by [facility] that her SNF stay would be denied. Patient appears to be from memory care unit with dementia, likely not rehab candidate.   Assessment & Plan:   Principal Problem:   Acute metabolic encephalopathy Active Problems:   Acute colitis   HCAP (healthcare-associated pneumonia)   AKI (acute kidney injury) ([place])   Essential hypertension   Parkinson's disease   Syncope   Dementia without behavioral disturbance ([place])   Depression with anxiety   Septic shock (HCC)   PE (pulmonary thromboembolism) ([place])   Acute metabolic encephalopathy -CT head no acute abnormality.  Avoid sedating medications. -Could be due to dementia, Parkinson's disease. -Continue Sinemet, Namenda and Aricept for dementia -Now at baseline   Acute on chronic hypoxic respiratory failure Acute bilateral pulmonary embolism -Family does not want any  aggressive intervention. -Continue supplemental oxygen and wean as tolerated.  She weaned down to 4L of supplemental oxygen. -Patient was started on anticoagulation but given risk of falls with her dementia and Parkinson's disease, anticoagulation was discontinued after weighing benefit and risk with family    AKI likely prerenal due to ATN -Resolved    Thrombocytopenia -Resolved    Septic shock secondary to suspected colitis and HCAP -Patient was hypotensive requiring Levophed support. She was started on stress dose steroids. Patient reported having abdominal pain. CT abdomen shows possible colitis. Initiated on broad-spectrum antibiotics aztreonam and metronidazole -Resolved. WB normal today     DVT prophylaxis: SCD  Code Status: DNR  Family Communication: None at bedside  Disposition Plan:  Status is: Inpatient Remains inpatient appropriate because: Await family decision if they want to appeal SNF denial    Antimicrobials:  Anti-infectives (From admission, onward)    Start     Dose/Rate Route Frequency Ordered Stop   10/13/22 1230  levofloxacin (LEVAQUIN) IVPB 750 mg  Status:  Discontinued        750 mg 100 mL/hr over 90 Minutes Intravenous Daily 10/13/22 1138 10/17/22 1254   10/10/22 0100  vancomycin (VANCOREADY) IVPB 750 mg/150 mL  Status:  Discontinued       See Hyperspace for full Linked Orders Report.   750 mg 150 mL/hr over 60 Minutes Intravenous Every 24 hours 10/09/22 0009 10/09/22 1017   10/09/22 0100  vancomycin (VANCOREADY) IVPB 1500 mg/300 mL       See Hyperspace for full Linked Orders Report.   1,500 mg 150 mL/hr over 120 Minutes Intravenous  Once 10/09/22 0009 10/09/22  9357   10/08/22 0400  metroNIDAZOLE (FLAGYL) IVPB 500 mg  Status:  Discontinued        500 mg 100 mL/hr over 60 Minutes Intravenous Every 12 hours 10/07/22 1823 10/12/22 1228   10/07/22 2200  aztreonam (AZACTAM) 1 g in sodium chloride 0.9 % 100 mL IVPB  Status:  Discontinued        1 g 200  mL/hr over 30 Minutes Intravenous Every 8 hours 10/07/22 1859 10/12/22 1228   10/07/22 1902  vancomycin variable dose per unstable renal function (pharmacist dosing)  Status:  Discontinued         Does not apply See admin instructions 10/07/22 1906 10/09/22 0012   10/07/22 1845  vancomycin (VANCOREADY) IVPB 2000 mg/400 mL        2,000 mg 200 mL/hr over 120 Minutes Intravenous  Once 10/07/22 1841 10/07/22 2358   10/07/22 1615  aztreonam (AZACTAM) 2 g in sodium chloride 0.9 % 100 mL IVPB  Status:  Discontinued        2 g 200 mL/hr over 30 Minutes Intravenous Every 8 hours 10/07/22 1603 10/07/22 1859   10/07/22 1615  vancomycin (VANCOCIN) IVPB 1000 mg/200 mL premix  Status:  Discontinued        1,000 mg 200 mL/hr over 60 Minutes Intravenous  Once 10/07/22 1603 10/07/22 1841   10/07/22 1615  metroNIDAZOLE (FLAGYL) IVPB 500 mg        500 mg 100 mL/hr over 60 Minutes Intravenous  Once 10/07/22 1603 10/07/22 1848        Objective: Vitals:   10/18/22 0845 10/18/22 1631 10/18/22 2020 10/19/22 0839  BP: 119/67 129/66 (!) 141/65 (!) 111/50  Pulse: 69 80 79 67  Resp: '18 18 18 16  '$ Temp: 98.4 F (36.9 C) 98.1 F (36.7 C) 97.9 F (36.6 C) 98 F (36.7 C)  TempSrc: Oral     SpO2: 99% 95% 100% 98%  Weight:      Height:        Intake/Output Summary (Last 24 hours) at 10/19/2022 1146 Last data filed at 10/19/2022 0400 Gross per 24 hour  Intake --  Output 900 ml  Net -900 ml   Filed Weights   10/07/22 1800 10/08/22 0037  Weight: 84.9 kg 80.4 kg    Examination:  General exam: Appears calm and comfortable  Respiratory system: Clear to auscultation. Respiratory effort normal. No respiratory distress.  Cardiovascular system: S1 & S2 heard, RRR. +murmur  Gastrointestinal system: Abdomen is nondistended, soft  Central nervous system: Alert and oriented to self and place  Extremities: Symmetric in appearance  Skin: No rashes, lesions or ulcers on exposed skin   Data Reviewed: I have  personally reviewed following labs and imaging studies  CBC: Recent Labs  Lab 10/15/22 0815 10/16/22 0550 10/17/22 0614 10/18/22 0432 10/19/22 0427  WBC 16.2* 14.9* 13.1* 14.3* 10.3  NEUTROABS 9.8* 9.7* 9.6* 10.2* 6.7  HGB 9.7* 8.9* 8.9* 10.8* 9.3*  HCT 29.9* 28.0* 28.3* 33.6* 28.9*  MCV 99.3 102.2* 101.8* 100.6* 99.7  PLT 220 203 208 218 017   Basic Metabolic Panel: Recent Labs  Lab 10/13/22 0330 10/14/22 0521 10/16/22 0550  NA  --  141 141  K  --  4.3 3.8  CL  --  110 105  CO2  --  27 29  GLUCOSE  --  113* 108*  BUN  --  13 10  CREATININE  --  0.53 0.61  CALCIUM  --  7.7* 8.0*  MG 2.2  2.0 1.8  PHOS 1.8* 2.8 2.6   GFR: Estimated Creatinine Clearance: 52.8 mL/min (by C-G formula based on SCr of 0.61 mg/dL). Liver Function Tests: No results for input(s): "AST", "ALT", "ALKPHOS", "BILITOT", "PROT", "ALBUMIN" in the last 168 hours. No results for input(s): "LIPASE", "AMYLASE" in the last 168 hours. No results for input(s): "AMMONIA" in the last 168 hours. Coagulation Profile: No results for input(s): "INR", "PROTIME" in the last 168 hours. Cardiac Enzymes: No results for input(s): "CKTOTAL", "CKMB", "CKMBINDEX", "TROPONINI" in the last 168 hours. BNP (last 3 results) No results for input(s): "PROBNP" in the last 8760 hours. HbA1C: No results for input(s): "HGBA1C" in the last 72 hours. CBG: Recent Labs  Lab 10/14/22 1206 10/14/22 1601 10/14/22 2027 10/15/22 0035 10/15/22 0847  GLUCAP 178* 118* 94 107* 71   Lipid Profile: No results for input(s): "CHOL", "HDL", "LDLCALC", "TRIG", "CHOLHDL", "LDLDIRECT" in the last 72 hours. Thyroid Function Tests: No results for input(s): "TSH", "T4TOTAL", "FREET4", "T3FREE", "THYROIDAB" in the last 72 hours. Anemia Panel: No results for input(s): "VITAMINB12", "FOLATE", "FERRITIN", "TIBC", "IRON", "RETICCTPCT" in the last 72 hours. Sepsis Labs: No results for input(s): "PROCALCITON", "LATICACIDVEN" in the last 168  hours.  No results found for this or any previous visit (from the past 240 hour(s)).    Radiology Studies: DG Chest 1 View  Result Date: 10/18/2022 CLINICAL DATA:  Cough. EXAM: CHEST  1 VIEW COMPARISON:  Chest x-ray dated October 08, 2022. FINDINGS: Interval removal of the right internal jugular central venous catheter. Unchanged borderline cardiomegaly. Low lung volumes are present, causing crowding of the pulmonary vasculature. Left basilar atelectasis. No focal consolidation, pleural effusion, or pneumothorax. No acute osseous abnormality. IMPRESSION: 1. Low lung volumes with left basilar atelectasis. Electronically Signed   By: Titus Dubin M.D.   On: 10/18/2022 10:04      Scheduled Meds:  aspirin  81 mg Oral Daily   atorvastatin  20 mg Oral QHS   carbidopa-levodopa  2 tablet Oral TID   carbidopa-levodopa  2 tablet Oral QHS   And   entacapone  200 mg Oral QHS   donepezil  10 mg Oral QHS   DULoxetine  60 mg Oral Daily   famotidine  20 mg Oral QHS   folic acid  1 mg Oral Daily   melatonin  5 mg Oral QHS   memantine  10 mg Oral BID   metoprolol tartrate  25 mg Oral BID   multivitamin with minerals  1 tablet Oral Daily   nystatin  5 mL Oral QID   pregabalin  150 mg Oral QHS   vitamin E  400 Units Oral Daily   Continuous Infusions:  sodium chloride Stopped (10/14/22 1210)     LOS: 12 days     Dessa Phi, DO [facility] 10/19/2022, 11:46 AM   Available via Epic secure chat 7am-7pm After these hours, please refer to coverage provider listed on [email or web address]

## 2022-10-19 NOTE — TOC Progression Note (Signed)
Transition of Care [facility]) - Progression Note    Patient Details  Name: Morgan Dennis MRN: [id number] Date of Birth: 29-May-1934  [facility] CM/SW Contact  Laurena Slimmer, RN Phone Number: 10/19/2022, 4:08 PM  Clinical Narrative:    Attempt to contact patient's son to give phone number for fast appeal. No answer. VM box is full. Unable to leave a message.   Expected Discharge Plan: [place] Barriers to Discharge: Continued Medical Work up  Expected Discharge Plan and Services Expected Discharge Plan: [place]   Discharge Planning Services: CM Consult Post Acute Care Choice: [place] Living arrangements for the past 2 months: [place]                 DME Arranged: N/A DME Agency: NA                   Social Determinants of Health (SDOH) Interventions    Readmission Risk Interventions     No data to display

## 2022-10-19 NOTE — Progress Notes (Addendum)
Physical Therapy Treatment Patient Details Name: Morgan Dennis MRN: [id number] DOB: [birth year] Today's Date: 10/19/2022   History of Present Illness Pt is an 89 y/o female admitted from a memory care facility secondary to syncope, cough and abdominal pain. Pt found to have metabolic encephalopathy and septic shock. PMH including but not limited to hypertension, hyperlipidemia, GERD, depression with anxiety, Parkinson's disease, dementia, rheumatoid arthritis, recent admission due to COVID-19 infection on 2 L oxygen.    PT Comments    Pt was long sitting in bed upon arriving. Agrees to session with encouragement. Easily able to exit L side of bed, stand to RW and ambulate. After ~ 10 ft with RW, pt states that the RW is not rolling correctly and pushes it aside. Ambulated ~ 25 ft more ft with HHA +1 prior to ambulating without AD/UE support the rest of session.Cognition and poor insight of deficits makes pt a high fall risk but overall is doing well. DC recs updated to HHPT back at ALF( current home). Acute PT will continue to follow and progress as able per current POC.   Recommendations for follow up therapy are one component of a multi-disciplinary discharge planning process, led by the attending physician.  Recommendations may be updated based on patient status, additional functional criteria and insurance authorization.  Follow Up Recommendations  Other (comment) (DC back to memory unit/AFL) HHPT     Assistance Recommended at Discharge Frequent or constant Supervision/Assistance  Patient can return home with the following A little help with walking and/or transfers;A little help with bathing/dressing/bathroom;Assistance with cooking/housework;Direct supervision/assist for financial management;Assistance with feeding;Direct supervision/assist for medications management;Help with stairs or ramp for entrance;Assist for transportation   Equipment Recommendations  None recommended by PT        Precautions / Restrictions Precautions Precautions: Fall Restrictions Weight Bearing Restrictions: No     Mobility  Bed Mobility Overal bed mobility: Needs Assistance Bed Mobility: Supine to Sit, Sit to Supine  Supine to sit: Supervision, HOB elevated Sit to supine: Supervision   Transfers Overall transfer level: Needs assistance Equipment used: Rolling walker (2 wheels) Transfers: Sit to/from Stand Sit to Stand: Min guard    General transfer comment: no physical assistance required to stand from EOB or to stand>sit. pt is slightly impulsive.    Ambulation/Gait  Ambulation/Gait assistance: Min guard Gait Distance (Feet): 80 Feet Assistive device: 1 person hand held assist, None Gait Pattern/deviations: Decreased step length - right, Decreased step length - left, Decreased stance time - right, Decreased stance time - left, Decreased stride length, Decreased dorsiflexion - right, Decreased dorsiflexion - left, Decreased weight shift to right, Decreased weight shift to left, Shuffle, Narrow base of support Gait velocity: decreased     General Gait Details: Pt was able to ambulate without AD with HHA + 1 that progress to no AD. Do recommend pt use RW when OOB.    Balance Overall balance assessment: Needs assistance Sitting-balance support: Feet supported Sitting balance-Leahy Scale: Good     Standing balance support: Bilateral upper extremity supported Standing balance-Leahy Scale: Fair Standing balance comment: Fair with +1 UE support, good with UE support on RW, poor without UE support    Cognition Arousal/Alertness: Awake/alert Behavior During Therapy: WFL for tasks assessed/performed Overall Cognitive Status: History of cognitive impairments - at baseline    Following Commands: Follows one step commands consistently, Follows one step commands with increased time Safety/Judgement: Decreased awareness of safety, Decreased awareness of deficits   Problem Solving: Slow  processing,  Requires verbal cues, Requires tactile cues General Comments: Pt is A but disoriented               Pertinent Vitals/Pain Pain Assessment Pain Assessment: No/denies pain Faces Pain Scale: No hurt     PT Goals (current goals can now be found in the care plan section) Acute Rehab PT Goals Patient Stated Goal: none state Progress towards PT goals: Progressing toward goals    Frequency    Min 2X/week      PT Plan Discharge plan needs to be updated       AM-PAC PT "6 Clicks" Mobility   Outcome Measure  Help needed turning from your back to your side while in a flat bed without using bedrails?: None Help needed moving from lying on your back to sitting on the side of a flat bed without using bedrails?: A Little Help needed moving to and from a bed to a chair (including a wheelchair)?: A Little Help needed standing up from a chair using your arms (e.g., wheelchair or bedside chair)?: A Little Help needed to walk in hospital room?: A Little Help needed climbing 3-5 steps with a railing? : A Little 6 Click Score: 19    End of Session Equipment Utilized During Treatment: Oxygen;Gait belt Activity Tolerance: Patient tolerated treatment well Patient left: in bed;with bed alarm set Nurse Communication: Mobility status PT Visit Diagnosis: Other abnormalities of gait and mobility (R26.89)     Time: 5670-1410 PT Time Calculation (min) (ACUTE ONLY): 12 min  Charges:  $Gait Training: 8-22 mins          Julaine Fusi PTA 10/19/22, 4:33 PM

## 2022-10-20 DIAGNOSIS — K529 Noninfective gastroenteritis and colitis, unspecified: Secondary | ICD-10-CM | POA: Diagnosis not present

## 2022-10-20 DIAGNOSIS — G9341 Metabolic encephalopathy: Secondary | ICD-10-CM | POA: Diagnosis not present

## 2022-10-20 DIAGNOSIS — R404 Transient alteration of awareness: Secondary | ICD-10-CM | POA: Diagnosis not present

## 2022-10-20 DIAGNOSIS — R6521 Severe sepsis with septic shock: Secondary | ICD-10-CM | POA: Diagnosis not present

## 2022-10-20 DIAGNOSIS — W19XXXA Unspecified fall, initial encounter: Secondary | ICD-10-CM | POA: Diagnosis not present

## 2022-10-20 DIAGNOSIS — R5381 Other malaise: Secondary | ICD-10-CM | POA: Diagnosis not present

## 2022-10-20 DIAGNOSIS — Z7401 Bed confinement status: Secondary | ICD-10-CM | POA: Diagnosis not present

## 2022-10-20 DIAGNOSIS — R55 Syncope and collapse: Secondary | ICD-10-CM | POA: Diagnosis not present

## 2022-10-20 DIAGNOSIS — A419 Sepsis, unspecified organism: Secondary | ICD-10-CM | POA: Diagnosis not present

## 2022-10-20 MED ORDER — ASPIRIN 81 MG PO CHEW: 81.0000 mg | CHEWABLE_TABLET | Freq: Every day | ORAL | Status: DC

## 2022-10-20 MED ORDER — NYSTATIN 100000 UNIT/ML MT SUSP: 5.0000 mL | Freq: Four times a day (QID) | OROMUCOSAL | 0 refills | Status: DC

## 2022-10-20 MED ORDER — METOPROLOL TARTRATE 25 MG PO TABS: 25.0000 mg | ORAL_TABLET | Freq: Two times a day (BID) | ORAL | 3 refills | Status: DC

## 2022-10-20 NOTE — TOC Transition Note (Signed)
Transition of Care [facility]) - CM/SW Discharge Note   Patient Details  Name: Morgan Dennis MRN: [id number] Date of Birth: [birth year]  [facility] of [facility]) CM/SW Contact:  Laurena Slimmer, RN Phone Number: 10/20/2022, 1:25 PM   Clinical Narrative:     Spoke with patient's son to confirm discharge today to [facility]. Nurse will call report to [phone]. Discharge summary faxed to [phone].   EMS arranged. EMS packet arranged.   TOC signing off.     Barriers to Discharge: Continued Medical Work up   Patient Goals and CMS Choice Patient states their goals for this hospitalization and ongoing recovery are:: patient's son believes she may need rehab CMS Medicare.gov Compare Post Acute Care list provided to:: Patient Represenative (must comment) Choice offered to / list presented to : Adult Children  Discharge Placement                       Discharge Plan and Services   Discharge Planning Services: CM Consult Post Acute Care Choice: [place]          DME Arranged: N/A DME Agency: NA                  Social Determinants of Health (SDOH) Interventions     Readmission Risk Interventions     No data to display

## 2022-10-20 NOTE — Discharge Summary (Signed)
Physician Discharge Summary   Patient: Morgan Dennis MRN: [id number] DOB: 12-27-1933  Admit date:     10/07/2022  Discharge date: 10/20/22  Discharge Physician: Estill Cotta, MD    PCP: Jerrol Banana., MD   Recommendations at discharge:   Recommend outpatient palliative follow-up  Discharge Diagnoses:    Acute metabolic encephalopathy   Acute colitis-resolved   HCAP (healthcare-associated pneumonia)   AKI (acute kidney injury) [facility])   Essential hypertension   Parkinson's disease   Syncope   Dementia without behavioral disturbance ([place])   Depression with anxiety   Septic shock ([place])   PE (pulmonary thromboembolism) [facility])   Hospital Course:  Morgan Dennis is an 88 years old female with PMH significant for hypertension, hyperlipidemia, dementia, Parkinson disease, anxiety, depression, rheumatoid arthritis and recent COVID-19 infection presented in the ED from memory care center with chief complaint of syncope and hypotension.  Patient had witnessed syncopal episode in memory unit and was found to be hypotensive.  She was recently hospitalized and was treated with paxlovid and steroids for COVID-19 infection.  She was discharged on 2 L of supplemental oxygen.  Patient was found to be septic and started empirically on antibiotics due to suspected colitis and possible HCAP versus aspiration pneumonia.  Patient was hypotensive requiring pressor support and admitted in ICU. TRH pick up 10/11/22.  He is back to baseline, awaiting placement   Assessment and Plan:  Acute metabolic encephalopathy -CT head no acute abnormality.  Avoid sedating medications. -Could be due to dementia, Parkinson's disease. -Continue Sinemet, Namenda and Aricept for dementia -Now at baseline   Acute on chronic hypoxic respiratory failure Acute bilateral pulmonary embolism -Family does not want any aggressive intervention. -Continue supplemental O2 and wean as tolerated, currently O2 sat is 98% on 2 L  via [place]. -Patient was started on anticoagulation but given risk of falls with her dementia and Parkinson's disease, anticoagulation was discontinued after weighing benefit and risk with family    AKI likely prerenal due to ATN -Resolved, torsemide, metolazone, lisinopril discontinued   Thrombocytopenia -Resolved    Septic shock secondary to suspected colitis and HCAP -Patient was hypotensive requiring Levophed support. She was started on stress dose steroids, which has been tapered off. - Patient reported having abdominal pain. CT abdomen shows possible colitis.  Initiated on broad-spectrum antibiotics aztreonam and metronidazole -Completed course of antibiotics on 1031      Pain control - [place] was reviewed. and patient was instructed, not to drive, operate heavy machinery, perform activities at heights, swimming or participation in water activities or provide baby-sitting services while on Pain, Sleep and Anxiety Medications; until their outpatient Physician has advised to do so again. Also recommended to not to take more than prescribed Pain, Sleep and Anxiety Medications.  Consultants: Palliative medicine Procedures performed: None Disposition: Memory care Diet recommendation:  Discharge Diet Orders (From admission, onward)     Start     Ordered   10/20/22 0000  Diet - low sodium heart healthy        10/20/22 1158            DISCHARGE MEDICATION: Allergies as of 10/20/2022       Reactions   Penicillins Swelling, Rash   Other reaction(s): Rash Has patient had a PCN reaction causing immediate rash, facial/tongue/throat swelling, SOB or lightheadedness with hypotension: Yes Has patient had a PCN reaction causing severe rash involving mucus membranes or skin necrosis: Unknown Has  patient had a PCN reaction that required hospitalization:Yes Has patient had a PCN reaction occurring within the last 10 years: No If all of  the above answers are "NO", then may proceed with Cephalosporin use.   Evista  [raloxifene]    Other reaction(s): Joint Pains   Ibandronic Acid    Other reaction(s): Muscle Pain   Remicade [infliximab] Other (See Comments)   Due to bleeding ulcer issue--MD advised her not to take   Risedronate Sodium    Other reaction(s): Joint Pains        Medication List     STOP taking these medications    ascorbic acid 1000 MG tablet Commonly known as: VITAMIN C   lisinopril 5 MG tablet Commonly known as: ZESTRIL   metolazone 2.5 MG tablet Commonly known as: ZAROXOLYN   oyster calcium 500 MG Tabs tablet   potassium chloride SA 20 MEQ tablet Commonly known as: KLOR-CON M   torsemide 20 MG tablet Commonly known as: DEMADEX   Vitamin D3 50 MCG (2000 UT) Tabs       TAKE these medications    acetaminophen 325 MG tablet Commonly known as: TYLENOL Take 2 tablets (650 mg total) by mouth every 6 (six) hours as needed for mild pain (or Fever >/= 101).   aspirin 81 MG chewable tablet Chew 1 tablet (81 mg total) by mouth daily. Start taking on: October 21, 2022   atorvastatin 20 MG tablet Commonly known as: LIPITOR Take 20 mg by mouth at bedtime.   carbidopa-levodopa 25-100 MG tablet Commonly known as: SINEMET IR Take 2 tablets by mouth 3 (three) times daily.   carbidopa-levodopa-entacapone 50-200-200 MG tablet Commonly known as: STALEVO Take 1 tablet by mouth at bedtime.   denosumab 60 MG/ML Sosy injection Commonly known as: PROLIA Inject 60 mg into the skin every 6 (six) months.   diclofenac Sodium 1 % Gel Commonly known as: VOLTAREN Apply 4 g topically in the morning, at noon, and at bedtime. To both knees   docusate sodium 100 MG capsule Commonly known as: COLACE Take 100 mg by mouth daily as needed. Monday, Wednesday, Friday   donepezil 10 MG tablet Commonly known as: ARICEPT Take 10 mg by mouth at bedtime.   DULoxetine 60 MG capsule Commonly known as:  CYMBALTA Take 60 mg by mouth daily.   famotidine 20 MG tablet Commonly known as: PEPCID Take 20 mg by mouth 2 (two) times daily.   folic acid 1 MG tablet Commonly known as: FOLVITE Take 1 mg by mouth daily.   HYDROcodone-acetaminophen 7.5-325 MG tablet Commonly known as: NORCO Take 1 tablet by mouth in the morning and at bedtime.   lanolin/mineral oil Lotn Apply 1 Application topically daily. To legs   melatonin 5 MG Tabs Take 5 mg by mouth at bedtime.   memantine 10 MG tablet Commonly known as: NAMENDA Take 10 mg by mouth 2 (two) times daily.   methotrexate 2.5 MG tablet Commonly known as: RHEUMATREX Take 15 mg by mouth every Wednesday.   metoprolol tartrate 25 MG tablet Commonly known as: LOPRESSOR Take 1 tablet (25 mg total) by mouth 2 (two) times daily.   Minerin Creme Crea Apply 1 Application topically daily.   multivitamin with minerals Tabs tablet Take 1 tablet by mouth daily.   nystatin 100000 UNIT/ML suspension Commonly known as: MYCOSTATIN Take 5 mLs (500,000 Units total) by mouth 4 (four) times daily for 7 days.   polyethylene glycol 17 g packet Commonly known as: MiraLax Take  17 g by mouth daily as needed for moderate constipation or severe constipation. 2-3 days without stool What changed: additional instructions   pregabalin 150 MG capsule Commonly known as: LYRICA Take 150 mg by mouth 2 (two) times daily.   solifenacin 5 MG tablet Commonly known as: VESICARE Take 1 tablet (5 mg total) by mouth daily.   vitamin E 180 MG (400 UNITS) capsule Take 400 Units by mouth daily.        Follow-up Information     Jerrol Banana., MD. Schedule an appointment as soon as possible for a visit in 2 week(s).   Specialty: Family Medicine Why: for hospital follow-up Contact information: [address] [place] [address] [phone]                Discharge Exam: Danley Danker Weights   10/07/22 1800 10/08/22 0037  Weight:  84.9 kg 80.4 kg   S: No acute issues overnight, very deconditioned but following commands, tolerated mechanical soft diet/dysphagia 3 with thin liquids.  Vitals:   10/19/22 1550 10/19/22 2027 10/20/22 0634 10/20/22 0910  BP: (!) 116/57 139/79 (!) 144/58 (!) 111/91  Pulse: 81 79 75 79  Resp: '16 20 18 16  '$ Temp: 99 F (37.2 C) 97.9 F (36.6 C) 97.7 F (36.5 C) 98.5 F (36.9 C)  TempSrc:      SpO2: 94% 98% 98% 96%  Weight:      Height:         Physical Exam General: Alert, appears comfortable, chronically ill looking, deconditioned, NAD Cardiovascular: S1 S2 clear, RRR.  Respiratory: CTAB, no wheezing, rales or rhonchi Gastrointestinal: Soft, nontender, nondistended, NBS Ext: no pedal edema bilaterally Neuro: no new deficits  Condition at discharge: fair  The results of significant diagnostics from this hospitalization (including imaging, microbiology, ancillary and laboratory) are listed below for reference.   Imaging Studies: DG Chest 1 View  Result Date: 10/18/2022 CLINICAL DATA:  Cough. EXAM: CHEST  1 VIEW COMPARISON:  Chest x-ray dated October 08, 2022. FINDINGS: Interval removal of the right internal jugular central venous catheter. Unchanged borderline cardiomegaly. Low lung volumes are present, causing crowding of the pulmonary vasculature. Left basilar atelectasis. No focal consolidation, pleural effusion, or pneumothorax. No acute osseous abnormality. IMPRESSION: 1. Low lung volumes with left basilar atelectasis. Electronically Signed   By: Titus Dubin M.D.   On: 10/18/2022 10:04   US Venous Img Upper Bilat (DVT)  Result Date: 10/08/2022 CLINICAL DATA:  [id number] Dyspnea [id number], possible PE on recent CTA chest EXAM: BILATERAL UPPER EXTREMITY VENOUS DOPPLER ULTRASOUND TECHNIQUE: Gray-scale sonography with graded compression, as well as color Doppler and duplex ultrasound were performed to evaluate the bilateral upper extremity deep venous systems from the level of the  subclavian vein and including the jugular, axillary, basilic, radial, ulnar and upper cephalic vein. Spectral Doppler was utilized to evaluate flow at rest and with distal augmentation maneuvers. COMPARISON:  None Available. FINDINGS: RIGHT UPPER EXTREMITY Internal Jugular Vein: Not evaluated due to presence of central venous catheter with bleeding. Subclavian Vein: No evidence of thrombus. Normal compressibility, respiratory phasicity and response to augmentation. Axillary Vein: No evidence of thrombus. Normal compressibility, respiratory phasicity and response to augmentation. Cephalic Vein: No evidence of thrombus. Normal compressibility, respiratory phasicity and response to augmentation. Basilic Vein: No evidence of thrombus. Normal compressibility, respiratory phasicity and response to augmentation. Brachial Veins: No evidence of thrombus. Normal compressibility, respiratory phasicity and response to augmentation. Radial Veins: No evidence of thrombus. Normal compressibility,  respiratory phasicity and response to augmentation. Ulnar Veins: No evidence of thrombus. Normal compressibility, respiratory phasicity and response to augmentation. Venous Reflux:  None. Other Findings:  None. LEFT UPPER EXTREMITY Internal Jugular Vein: No evidence of thrombus. Normal compressibility, respiratory phasicity and response to augmentation. Subclavian Vein: No evidence of thrombus. Normal compressibility, respiratory phasicity and response to augmentation. Axillary Vein: No evidence of thrombus. Normal compressibility, respiratory phasicity and response to augmentation. Cephalic Vein: No evidence of thrombus. Normal compressibility, respiratory phasicity and response to augmentation. Basilic Vein: No evidence of thrombus. Normal compressibility, respiratory phasicity and response to augmentation. Brachial Veins: No evidence of thrombus. Normal compressibility, respiratory phasicity and response to augmentation. Radial Veins:  No evidence of thrombus. Normal compressibility, respiratory phasicity and response to augmentation. Ulnar Veins: No evidence of thrombus. Normal compressibility, respiratory phasicity and response to augmentation. Venous Reflux:  None. Other Findings:  None. IMPRESSION: No evidence of DVT within either upper extremity. Electronically Signed   By: Lucrezia Europe M.D.   On: 10/08/2022 14:10   US Venous Img Lower Bilateral (DVT)  Result Date: 10/08/2022 CLINICAL DATA:  Dyspnea, concern of pulmonary embolism EXAM: BILATERAL LOWER EXTREMITY VENOUS DOPPLER ULTRASOUND TECHNIQUE: Gray-scale sonography with compression, as well as color and duplex ultrasound, were performed to evaluate the deep venous system(s) from the level of the common femoral vein through the popliteal and proximal calf veins. COMPARISON:  None Available. FINDINGS: VENOUS Normal compressibility of the common femoral, superficial femoral, and popliteal veins, as well as the visualized calf veins. Visualized portions of profunda femoral vein and great saphenous vein unremarkable. No filling defects to suggest DVT on grayscale or color Doppler imaging. Doppler waveforms show normal direction of venous flow, normal respiratory plasticity and response to augmentation. OTHER None. Limitations: Technologist describes technically difficult study secondary to patient movement. IMPRESSION: Negative. Electronically Signed   By: Lucrezia Europe M.D.   On: 10/08/2022 10:39   DG Chest Port 1 View  Result Date: 10/08/2022 CLINICAL DATA:  Central venous catheter placement EXAM: PORTABLE CHEST 1 VIEW COMPARISON:  10/07/2022 FINDINGS: Right internal jugular central venous catheter is in place with its tip at the superior cavoatrial junction. Lung volumes are small. Diffuse asymmetric pulmonary infiltrate may relate to atelectasis or asymmetric pulmonary edema. No pneumothorax or pleural effusion. Cardiac size within normal limits. No acute bone abnormality. IMPRESSION:  1. Right internal jugular central venous catheter in appropriate position. No pneumothorax. 2. Pulmonary hypoinflation. Electronically Signed   By: Fidela Salisbury M.D.   On: 10/08/2022 03:54   CT Angio Chest Pulmonary Embolism (PE) W or WO Contrast  Result Date: 10/08/2022 CLINICAL DATA:  Pulmonary embolism (PE) suspected, high prob EXAM: CT ANGIOGRAPHY CHEST WITH CONTRAST TECHNIQUE: Multidetector CT imaging of the chest was performed using the standard protocol during bolus administration of intravenous contrast. Multiplanar CT image reconstructions and MIPs were obtained to evaluate the vascular anatomy. RADIATION DOSE REDUCTION: This exam was performed according to the departmental dose-optimization program which includes automated exposure control, adjustment of the mA and/or kV according to patient size and/or use of iterative reconstruction technique. CONTRAST:  50m OMNIPAQUE IOHEXOL 350 MG/ML SOLN COMPARISON:  10/17/2019 FINDINGS: Cardiovascular: Moderate coronary artery calcification. Extensive calcification of the aortic valve leaflets. Global cardiac size is within normal limits though left ventricular hypertrophy is noted. No pericardial effusion. Opacification the pulmonary arterial tree is slightly suboptimal despite repeat injection, however, there are tiny intraluminal branching filling defects identified within the posterior basal and left anteromedial segmental pulmonary arteries  of the lower lobes bilaterally in keeping with acute pulmonary emboli. The embolic burden is tiny. The central pulmonary arteries are of normal caliber. No CT evidence of right heart strain. Mild atherosclerotic calcification within the thoracic aorta. Mediastinum/Nodes: No pathologic thoracic adenopathy. Esophagus is unremarkable. Lungs/Pleura: Patchy multifocal ground-glass opacity likely relates to atelectasis on this expiratory phase images. Marked narrowing of the trachea and mainstem bronchi bilaterally are in  keeping with changes of tracheobronchomalacia. No pneumothorax or pleural effusion. Upper Abdomen: No acute abnormality. Musculoskeletal: No acute bone abnormality. Probable T8 and T11 vertebral hemangiomas. Review of the MIP images confirms the above findings. IMPRESSION: 1. Tiny acute pulmonary emboli within the lower lobes bilaterally. The embolic burden is tiny. No CT evidence of right heart strain. 2. Moderate coronary artery calcification. 3. Extensive calcification of the aortic valve leaflets. Echocardiography may be helpful to assess the degree of valvular dysfunction 4. Left ventricular hypertrophy. 5. Tracheobronchomalacia. Aortic Atherosclerosis (ICD10-I70.0). Electronically Signed   By: Fidela Salisbury M.D.   On: 10/08/2022 00:41   CT HEAD WO CONTRAST (5MM)  Result Date: 10/08/2022 CLINICAL DATA:  Syncope. EXAM: CT HEAD WITHOUT CONTRAST TECHNIQUE: Contiguous axial images were obtained from the base of the skull through the vertex without intravenous contrast. RADIATION DOSE REDUCTION: This exam was performed according to the departmental dose-optimization program which includes automated exposure control, adjustment of the mA and/or kV according to patient size and/or use of iterative reconstruction technique. COMPARISON:  Head CT dated 03/08/2022. FINDINGS: Brain: Mild age-related atrophy and chronic microvascular ischemic changes. There is no acute intracranial hemorrhage. No mass effect or midline shift. No extra-axial fluid collection. Vascular: No hyperdense vessel or unexpected calcification. Skull: Normal. Negative for fracture or focal lesion. Sinuses/Orbits: Mild diffuse mucoperiosteal thickening of paranasal sinuses. No air-fluid. The mastoid air cells are clear. Other: None IMPRESSION: 1. No acute intracranial pathology. 2. Mild age-related atrophy and chronic microvascular ischemic changes. Electronically Signed   By: Anner Crete M.D.   On: 10/08/2022 00:34   CT ABDOMEN PELVIS WO  CONTRAST  Result Date: 10/07/2022 CLINICAL DATA:  Abdominal pain. Patient states she does not feel well. Question sepsis. EXAM: CT ABDOMEN AND PELVIS WITHOUT CONTRAST TECHNIQUE: Multidetector CT imaging of the abdomen and pelvis was performed following the standard protocol without IV contrast. RADIATION DOSE REDUCTION: This exam was performed according to the departmental dose-optimization program which includes automated exposure control, adjustment of the mA and/or kV according to patient size and/or use of iterative reconstruction technique. COMPARISON:  CT of the abdomen and pelvis 06/03/2022. Two-view chest x-ray 10/07/2022. FINDINGS: Lower chest: Patchy airspace opacities are present at the lung bases bilaterally. Airways are patent. Heart size is normal. No significant pleural or pericardial effusion is present. Hepatobiliary: No focal liver abnormality is seen. No gallstones, gallbladder wall thickening, or biliary dilatation. Pancreas: Unremarkable. No pancreatic ductal dilatation or surrounding inflammatory changes. Spleen: Punctate calcifications are present. No discrete lesion is present. Adrenals/Urinary Tract: Adrenal glands are normal bilaterally. Kidneys are unremarkable. No stone or mass lesion is present. No obstruction is present. Ureters are within normal limits. The urinary bladder is normal. Stomach/Bowel: Stomach and duodenum are within normal limits. Small bowel is unremarkable. Terminal ileum is within normal limits. The appendix is not discretely visualized and may be surgically absent. The ascending and proximal transverse colon are fluid-filled. Distal transverse and descending colon are within normal limits. Diverticular changes are present in the sigmoid colon without focal inflammation to suggest diverticulitis. Vascular/Lymphatic: Atherosclerotic calcifications are present  the aorta and branch vessels without aneurysm. Reproductive: Posterior calcified uterine fibroid noted.  Uterus and adnexa otherwise normal for age. Other: A 14 mm paraumbilical hernia contains fat without bowel. Free fluid or free air is present. Musculoskeletal: Grade 1 anterolisthesis at L3-4 is stable. Spinal augmentation again noted at L1. Prominent hemangioma is present at T11 a smaller hemangioma T8. Vertebral body heights are otherwise maintained. Degenerative changes are noted at the SI joints. Bony pelvis is otherwise normal. The hips are located and within normal limits bilaterally. IMPRESSION: 1. Patchy airspace opacities at the lung bases bilaterally are concerning for pneumonia or aspiration 2. Fluid-filled ascending and proximal transverse colon without obstruction. This may represent a nonspecific colitis or diarrhea. 3. Sigmoid diverticulosis without diverticulitis. 4. Stable grade 1 anterolisthesis at L3-4. 5. Spinal augmentation at L1. 6.  Aortic Atherosclerosis (ICD10-I70.0). Electronically Signed   By: San Morelle M.D.   On: 10/07/2022 17:22   DG Chest 2 View  Result Date: 10/07/2022 CLINICAL DATA:  Possible sepsis.  Syncope. EXAM: CHEST - 2 VIEW COMPARISON:  08/19/2022 and prior radiographs FINDINGS: The cardiomediastinal silhouette is unremarkable. Mild chronic peribronchial thickening again noted. There is no evidence of focal airspace disease, pulmonary edema, suspicious pulmonary nodule/mass, pleural effusion, or pneumothorax. No acute bony abnormalities are identified. IMPRESSION: 1. No active cardiopulmonary disease. 2. Mild chronic peribronchial thickening. Electronically Signed   By: Margarette Canada M.D.   On: 10/07/2022 15:12    Microbiology: Results for orders placed or performed during the hospital encounter of 10/07/22  Resp Panel by RT-PCR (Flu A&B, Covid) Anterior Nasal Swab     Status: None   Collection Time: 10/07/22  2:08 PM   Specimen: Anterior Nasal Swab  Result Value Ref Range Status   SARS Coronavirus 2 by RT PCR NEGATIVE NEGATIVE Final    Comment:  (NOTE) SARS-CoV-2 target nucleic acids are NOT DETECTED.  The SARS-CoV-2 RNA is generally detectable in upper respiratory specimens during the acute phase of infection. The lowest concentration of SARS-CoV-2 viral copies this assay can detect is 138 copies/mL. A negative result does not preclude SARS-Cov-2 infection and should not be used as the sole basis for treatment or other patient management decisions. A negative result may occur with  improper specimen collection/handling, submission of specimen other than nasopharyngeal swab, presence of viral mutation(s) within the areas targeted by this assay, and inadequate number of viral copies(<138 copies/mL). A negative result must be combined with clinical observations, patient history, and epidemiological information. The expected result is Negative.  Fact Sheet for Patients:  EntrepreneurPulse.com.au  Fact Sheet for Healthcare Providers:  IncredibleEmployment.be  This test is no t yet approved or cleared by the [place] FDA and  has been authorized for detection and/or diagnosis of SARS-CoV-2 by FDA under an Emergency Use Authorization (EUA). This EUA will remain  in effect (meaning this test can be used) for the duration of the COVID-19 declaration under Section 564(b)(1) of the Act, 21 U.S.C.section 360bbb-3(b)(1), unless the authorization is terminated  or revoked sooner.       Influenza A by PCR NEGATIVE NEGATIVE Final   Influenza B by PCR NEGATIVE NEGATIVE Final    Comment: (NOTE) The Xpert Xpress SARS-CoV-2/FLU/RSV plus assay is intended as an aid in the diagnosis of influenza from Nasopharyngeal swab specimens and should not be used as a sole basis for treatment. Nasal washings and aspirates are unacceptable for Xpert Xpress SARS-CoV-2/FLU/RSV testing.  Fact Sheet for Patients: EntrepreneurPulse.com.au  Fact Sheet for Healthcare  Providers: IncredibleEmployment.be  This test is not yet approved or cleared by the [place] and has been authorized for detection and/or diagnosis of SARS-CoV-2 by FDA under an Emergency Use Authorization (EUA). This EUA will remain in effect (meaning this test can be used) for the duration of the COVID-19 declaration under Section 564(b)(1) of the Act, 21 U.S.C. section 360bbb-3(b)(1), unless the authorization is terminated or revoked.  Performed at [facility], [address] [place], [place] [address]   Culture, blood (routine x 2)     Status: None   Collection Time: 10/07/22  4:00 PM   Specimen: BLOOD  Result Value Ref Range Status   Specimen Description BLOOD BLOOD LEFT ARM  Final   Special Requests   Final    BOTTLES DRAWN AEROBIC AND ANAEROBIC Blood Culture adequate volume   Culture   Final    NO GROWTH 5 DAYS Performed at [facility], [address] [place], [place] [address]    Report Status 10/12/2022 FINAL  Final  Urine Culture     Status: Abnormal   Collection Time: 10/07/22  4:45 PM   Specimen: Urine, Random  Result Value Ref Range Status   Specimen Description   Final    URINE, RANDOM Performed at [facility], [address] [place], [place] [address]    Special Requests   Final    NONE Performed at [facility], [address] [place], [place] [address]    Culture 30,000 COLONIES/mL ESCHERICHIA COLI (A)  Final   Report Status 10/10/2022 FINAL  Final   Organism ID, Bacteria ESCHERICHIA COLI (A)  Final      Susceptibility   Escherichia coli - MIC*    AMPICILLIN 4 SENSITIVE Sensitive     CEFAZOLIN <=4 SENSITIVE Sensitive     CEFEPIME <=0.12 SENSITIVE Sensitive     CEFTRIAXONE <=0.25 SENSITIVE Sensitive     CIPROFLOXACIN <=0.25 SENSITIVE Sensitive     GENTAMICIN <=1 SENSITIVE Sensitive     IMIPENEM <=0.25 SENSITIVE Sensitive     NITROFURANTOIN <=16 SENSITIVE Sensitive      TRIMETH/SULFA <=20 SENSITIVE Sensitive     AMPICILLIN/SULBACTAM <=2 SENSITIVE Sensitive     PIP/TAZO <=4 SENSITIVE Sensitive     * 30,000 COLONIES/mL ESCHERICHIA COLI  Culture, blood (routine x 2)     Status: None   Collection Time: 10/07/22  8:37 PM   Specimen: BLOOD RIGHT HAND  Result Value Ref Range Status   Specimen Description BLOOD RIGHT HAND  Final   Special Requests   Final    BOTTLES DRAWN AEROBIC ONLY Blood Culture adequate volume   Culture   Final    NO GROWTH 5 DAYS Performed at [facility], [address] [place], [place] [address]    Report Status 10/12/2022 FINAL  Final  MRSA Next Gen by PCR, Nasal     Status: None   Collection Time: 10/08/22 12:35 AM   Specimen: Nasal Mucosa; Nasal Swab  Result Value Ref Range Status   MRSA by PCR Next Gen NOT DETECTED NOT DETECTED Final    Comment: (NOTE) The GeneXpert MRSA Assay (FDA approved for NASAL specimens only), is one component of a comprehensive MRSA colonization surveillance program. It is not intended to diagnose MRSA infection nor to guide or monitor treatment for MRSA infections. Test performance is not FDA approved in patients less than 72 years old. Performed at [facility], [address] [place],  [address]  Labs: CBC: Recent Labs  Lab 10/15/22 0815 10/16/22 0550 10/17/22 0614 10/18/22 0432 10/19/22 0427  WBC 16.2* 14.9* 13.1* 14.3* 10.3  NEUTROABS 9.8* 9.7* 9.6* 10.2* 6.7  HGB 9.7* 8.9* 8.9* 10.8* 9.3*  HCT 29.9* 28.0* 28.3* 33.6* 28.9*  MCV 99.3 102.2* 101.8* 100.6* 99.7  PLT 220 203 208 218 825   Basic Metabolic Panel: Recent Labs  Lab 10/14/22 0521 10/16/22 0550  NA 141 141  K 4.3 3.8  CL 110 105  CO2 27 29  GLUCOSE 113* 108*  BUN 13 10  CREATININE 0.53 0.61  CALCIUM 7.7* 8.0*  MG 2.0 1.8  PHOS 2.8 2.6   Liver Function Tests: No results for input(s): "AST", "ALT", "ALKPHOS", "BILITOT", "PROT", "ALBUMIN" in the last 168 hours. CBG: Recent Labs   Lab 10/14/22 1206 10/14/22 1601 10/14/22 2027 10/15/22 0035 10/15/22 0847  GLUCAP 178* 118* 94 107* 71    Discharge time spent: greater than 30 minutes.  Signed: Estill Cotta, MD [facility] 10/20/2022

## 2022-10-20 NOTE — Care Management Important Message (Signed)
Important Message  Patient Details  Name: Morgan Dennis MRN: [id number] Date of Birth: 01-07-1934   Medicare Important Message Given:  Yes  I reviewed the Important Message from Medicare with HCPOA, Sheilah Pigeon, son and they are in agreement with her discharge. I wished her well and thanked him for his time.   [name] [name] 10/20/2022, 2:10 PM

## 2022-10-20 NOTE — Progress Notes (Deleted)
Established patient visit   Patient: Morgan Dennis   DOB: 09-Jan-1934   [name]  MRN: [id number] Visit Date: 10/25/2022  Today's healthcare provider: Eulis Foster, MD   No chief complaint on file.  Subjective    HPI  Follow up Hospitalization  Patient was admitted to [facility] on 10/07/2022 and discharged on 10/20/2022. She was treated for Acute metabolic encephalopathy . Treatment for this included; see notes in chart. Telephone follow up was done on *** She reports {excellent/good/fair:19992} compliance with treatment. She reports this condition is {resolved/improved/worsened:23923}.  -----------------------------------------------------------------------------------------   Medications: Facility-Administered Medications Prior to Visit  Medication Dose Route Frequency Provider   0.9 %  sodium chloride infusion  250 mL Intravenous Continuous Ouma, Bing Neighbors, NP   acetaminophen (TYLENOL) tablet 650 mg  650 mg Oral Q6H PRN Ivor Costa, MD   albuterol (PROVENTIL) (2.5 MG/3ML) 0.083% nebulizer solution 2.5 mg  2.5 mg Nebulization Q4H PRN Ivor Costa, MD   aspirin chewable tablet 81 mg  81 mg Oral Daily Dorothe Pea, RPH   atorvastatin (LIPITOR) tablet 20 mg  20 mg Oral QHS Ivor Costa, MD   carbidopa-levodopa (SINEMET IR) 25-100 MG per tablet immediate release 2 tablet  2 tablet Oral TID Armando Reichert, MD   carbidopa-levodopa (SINEMET IR) 25-100 MG per tablet immediate release 2 tablet  2 tablet Oral QHS Armando Reichert, MD   And   entacapone (COMTAN) tablet 200 mg  200 mg Oral QHS Dgayli, Berdine Addison, MD   docusate sodium (COLACE) capsule 100 mg  100 mg Oral Daily PRN Ivor Costa, MD   donepezil (ARICEPT) tablet 10 mg  10 mg Oral QHS Armando Reichert, MD   DULoxetine (CYMBALTA) DR capsule 60 mg  60 mg Oral Daily Dgayli, Berdine Addison, MD   famotidine (PEPCID) tablet 20 mg  20 mg Oral QHS Dolan, Carissa E, RPH   folic acid (FOLVITE) tablet 1 mg  1 mg Oral Daily Ivor Costa, MD   hydrocerin (EUCERIN) cream 1 Application  1 Application Topical Daily PRN Ivor Costa, MD   HYDROcodone-acetaminophen (NORCO) 7.5-325 MG per tablet 1 tablet  1 tablet Oral Q6H PRN Ivor Costa, MD   melatonin tablet 5 mg  5 mg Oral QHS Ivor Costa, MD   memantine [facility]) tablet 10 mg  10 mg Oral BID Armando Reichert, MD   metoprolol tartrate (LOPRESSOR) tablet 25 mg  25 mg Oral BID Armando Reichert, MD   multivitamin with minerals tablet 1 tablet  1 tablet Oral Daily Ivor Costa, MD   nystatin (MYCOSTATIN) 100000 UNIT/ML suspension 500,000 Units  5 mL Oral QID Armando Reichert, MD   ondansetron (ZOFRAN) injection 4 mg  4 mg Intravenous Q8H PRN Ivor Costa, MD   Oral care mouth rinse  15 mL Mouth Rinse PRN Lang Snow, NP   pregabalin (LYRICA) capsule 150 mg  150 mg Oral QHS Dorothe Pea, [place]   Vitamin E CAPS 400 Units  400 Units Oral Daily Ivor Costa, MD   Outpatient Medications Prior to Visit  Medication Sig   acetaminophen (TYLENOL) 325 MG tablet Take 2 tablets (650 mg total) by mouth every 6 (six) hours as needed for mild pain (or Fever >/= 101).   ascorbic acid (VITAMIN C) 1000 MG tablet Take 1,000 mg by mouth daily.    [START ON 10/21/2022] aspirin 81 MG chewable tablet Chew 1 tablet (81 mg total) by mouth daily.   atorvastatin (LIPITOR) 20 MG tablet Take 20 mg by  mouth at bedtime.   carbidopa-levodopa (SINEMET IR) 25-100 MG tablet Take 2 tablets by mouth 3 (three) times daily.   carbidopa-levodopa-entacapone (STALEVO) 50-200-200 MG tablet Take 1 tablet by mouth at bedtime.   Cholecalciferol (VITAMIN D3) 2000 UNITS TABS Take 2,000 Units by mouth daily.    denosumab (PROLIA) 60 MG/ML SOSY injection Inject 60 mg into the skin every 6 (six) months.   diclofenac Sodium (VOLTAREN) 1 % GEL Apply 4 g topically in the morning, at noon, and at bedtime. To both knees   docusate sodium (COLACE) 100 MG capsule Take 100 mg by mouth daily as needed. Monday, Wednesday, Friday    donepezil (ARICEPT) 10 MG tablet Take 10 mg by mouth at bedtime.   DULoxetine (CYMBALTA) 60 MG capsule Take 60 mg by mouth daily.   famotidine (PEPCID) 20 MG tablet Take 20 mg by mouth 2 (two) times daily.   folic acid (FOLVITE) 1 MG tablet Take 1 mg by mouth daily.   HYDROcodone-acetaminophen (NORCO) 7.5-325 MG tablet Take 1 tablet by mouth in the morning and at bedtime.   lanolin/mineral oil (KERI/THERA-DERM) LOTN Apply 1 Application topically daily. To legs   lisinopril (ZESTRIL) 5 MG tablet Take 5 mg by mouth daily.   Melatonin 5 MG TABS Take 5 mg by mouth at bedtime.    memantine (NAMENDA) 10 MG tablet Take 10 mg by mouth 2 (two) times daily.   methotrexate 2.5 MG tablet Take 15 mg by mouth every Wednesday.    metolazone (ZAROXOLYN) 2.5 MG tablet Take 2.5 mg by mouth daily. For 3 days 10/20-10/22   metoprolol tartrate (LOPRESSOR) 25 MG tablet Take 1 tablet (25 mg total) by mouth 2 (two) times daily.   Multiple Vitamin (MULTIVITAMIN WITH MINERALS) TABS tablet Take 1 tablet by mouth daily.   nystatin (MYCOSTATIN) 100000 UNIT/ML suspension Take 5 mLs (500,000 Units total) by mouth 4 (four) times daily for 7 days.   Oyster Shell (OYSTER CALCIUM) 500 MG TABS tablet Take 500 mg of elemental calcium by mouth daily.   polyethylene glycol (MIRALAX) 17 g packet Take 17 g by mouth daily as needed for moderate constipation or severe constipation. 2-3 days without stool (Patient taking differently: Take 17 g by mouth daily as needed for moderate constipation or severe constipation. 3 times a week, Monday, Wednesday and Friday)   potassium chloride SA (KLOR-CON M) 20 MEQ tablet Take 20 mEq by mouth 2 (two) times daily.   pregabalin (LYRICA) 150 MG capsule Take 150 mg by mouth 2 (two) times daily.   Skin Protectants, Misc. (MINERIN CREME) CREA Apply 1 Application topically daily.   solifenacin (VESICARE) 5 MG tablet Take 1 tablet (5 mg total) by mouth daily.   torsemide (DEMADEX) 20 MG tablet Take 20 mg by  mouth 2 (two) times daily.   vitamin E 400 UNIT capsule Take 400 Units by mouth daily.    Review of Systems  {Labs  Heme  Chem  Endocrine  Serology  Results Review (optional):23779}   Objective    There were no vitals taken for this visit. {Show previous vital signs (optional):23777}  Physical Exam  ***  No results found for any visits on 10/25/22.  Assessment & Plan     ***  No follow-ups on file.      {provider attestation***:1}   Eulis Foster, MD  [facility] [phone] (phone) [phone] (fax)  [place]

## 2022-10-20 NOTE — Progress Notes (Signed)
Occupational Therapy Treatment Patient Details Name: Morgan Dennis MRN: [id number] DOB: 1934/02/20 Today's Date: 10/20/2022   History of present illness Pt is an 88 y/o female admitted from a memory care facility secondary to syncope, cough and abdominal pain. Pt found to have metabolic encephalopathy and septic shock. PMH including but not limited to hypertension, hyperlipidemia, GERD, depression with anxiety, Parkinson's disease, dementia, rheumatoid arthritis, recent admission due to COVID-19 infection on 2 L oxygen.   OT comments  Patient in bed upon arrival, confused, A&Ox2, but agreeable to OT treatment. Tangential speech requiring frequent redirection. Tx session targeted to improve functional activity tolerance in prep for ADL and mobility tasks. Patient was able to perform bed mobility with supervision and HOB elevated. Patient was able to transfer sit<>stand with min guard, no physical assist required. Patient left in recliner with call bell in reach, chair alarm set, lunch tray set up, and all needs met.    Recommendations for follow up therapy are one component of a multi-disciplinary discharge planning process, led by the attending physician.  Recommendations may be updated based on patient status, additional functional criteria and insurance authorization.    Follow Up Recommendations  Skilled nursing-short term rehab (<3 hours/day)    Assistance Recommended at Discharge Frequent or constant Supervision/Assistance  Patient can return home with the following  Assist for transportation;Help with stairs or ramp for entrance;Assistance with cooking/housework;Direct supervision/assist for medications management;A little help with walking and/or transfers;A little help with bathing/dressing/bathroom   Equipment Recommendations  Other (comment) (Defer to next venue of care.)       Precautions / Restrictions Precautions Precautions: Fall Restrictions Weight Bearing Restrictions: No        Mobility Bed Mobility Overal bed mobility: Needs Assistance Bed Mobility: Supine to Sit     Supine to sit: Supervision, HOB elevated          Transfers Overall transfer level: Needs assistance Equipment used: Rolling walker (2 wheels) Transfers: Sit to/from Stand Sit to Stand: Min guard           General transfer comment: no physical assistance required to stand from EOB or to stand>sit.     Balance Overall balance assessment: Needs assistance Sitting-balance support: Feet supported Sitting balance-Leahy Scale: Good     Standing balance support: Bilateral upper extremity supported Standing balance-Leahy Scale: Fair                             ADL either performed or assessed with clinical judgement   ADL       Grooming: Wash/dry face;Set up;Sitting                                      Extremity/Trunk Assessment Upper Extremity Assessment Upper Extremity Assessment: Generalized weakness   Lower Extremity Assessment Lower Extremity Assessment: Generalized weakness        Vision Patient Visual Report: No change from baseline            Cognition Arousal/Alertness: Awake/alert Behavior During Therapy: WFL for tasks assessed/performed Overall Cognitive Status: History of cognitive impairments - at baseline Area of Impairment: Orientation, Following commands, Problem solving, Safety/judgement, Memory                 Orientation Level: Person, Time   Memory: Decreased short-term memory Following Commands: Follows one step commands consistently, Follows one step commands  with increased time Safety/Judgement: Decreased awareness of safety, Decreased awareness of deficits   Problem Solving: Slow processing, Requires verbal cues, Requires tactile cues                     Pertinent Vitals/ Pain       Pain Assessment Pain Assessment: No/denies pain   Frequency  Min 2X/week        Progress Toward  Goals  OT Goals(current goals can now be found in the care plan section)  Progress towards OT goals: Progressing toward goals  Acute Rehab OT Goals Patient Stated Goal: get better OT Goal Formulation: With patient Time For Goal Achievement: 10/29/22 Potential to Achieve Goals: [place] Discharge plan remains appropriate;Frequency remains appropriate       AM-PAC OT "6 Clicks" Daily Activity     Outcome Measure   Help from another person eating meals?: None Help from another person taking care of personal grooming?: A Little Help from another person toileting, which includes using toliet, bedpan, or urinal?: A Lot Help from another person bathing (including washing, rinsing, drying)?: A Lot Help from another person to put on and taking off regular upper body clothing?: A Little Help from another person to put on and taking off regular lower body clothing?: A Lot 6 Click Score: 16    End of Session Equipment Utilized During Treatment: Rolling walker (2 wheels);Oxygen  OT Visit Diagnosis: Unsteadiness on feet (R26.81);Muscle weakness (generalized) (M62.81);Other symptoms and signs involving cognitive function   Activity Tolerance Patient tolerated treatment well   Patient Left in bed;with call bell/phone within reach;with bed alarm set   Nurse Communication Mobility status        Time: 1210-1222 OT Time Calculation (min): 12 min  Charges: OT General Charges $OT Visit: 1 Visit OT Treatments $Therapeutic Activity: 8-22 mins    Hyacinth Marcelli, OTS 10/20/2022, 1:55 PM

## 2022-10-20 NOTE — TOC Progression Note (Addendum)
Transition of Care [facility]) - Progression Note    Patient Details  Name: Morgan Dennis MRN: [id number] Date of Birth: [birth year]  Transition of Care [facility]) CM/SW Contact  Laurena Slimmer, RN Phone Number: 10/20/2022, 10:14 AM  Clinical Narrative:    Spoke with patient's son, Morgan Dennis at the bedside. He was informed of insurance denial, and option to appeal. He and family are unsure if they will appeal. Morgan Dennis will speak with his brother and make a decision. They're goal is to have patient return to [facility]. Morgan Dennis stated patient was receiving therapy via [facility] before and would like to continue with the same therapy previously received.   Contacted [facility] @ [phone] to advised admission of patient return to facility.Morgan Dennis stated [facility] is only for PT services. For PT/ OT services therapy is given by [facility]. Discharge summary requested to be faxed to [phone].   Spoke with patient's son. He is agreeable to [facility] with [facility]. Patient will need EMS transport. He is unable to transport her. MD and nurse notified of plan.    Referral sent to Meg with [name]      Expected Discharge Plan: [place] Barriers to Discharge: Continued Medical Work up  Expected Discharge Plan and Services Expected Discharge Plan: [place]   Discharge Planning Services: CM Consult Post Acute Care Choice: [place] Living arrangements for the past 2 months: [place]                 DME Arranged: N/A DME Agency: NA                   Social Determinants of Health (SDOH) Interventions    Readmission Risk Interventions     No data to display

## 2022-10-20 NOTE — Progress Notes (Signed)
Report given to margaret at [facility]. [place] assisted with getting dressed and EMS to pick patient up.

## 2022-10-22 DIAGNOSIS — I959 Hypotension, unspecified: Secondary | ICD-10-CM | POA: Diagnosis not present

## 2022-10-23 ENCOUNTER — Telehealth: Payer: Self-pay

## 2022-10-23 NOTE — Telephone Encounter (Signed)
Per son Morgan Dennis pt is following with PCP at [facility] - no TCM done    [place] Direct Dial [phone]

## 2022-10-25 ENCOUNTER — Inpatient Hospital Stay: Payer: Medicare HMO | Admitting: Family Medicine

## 2022-10-25 ENCOUNTER — Encounter: Payer: Self-pay | Admitting: Emergency Medicine

## 2022-10-25 ENCOUNTER — Emergency Department: Payer: Medicare HMO

## 2022-10-25 DIAGNOSIS — Z86711 Personal history of pulmonary embolism: Secondary | ICD-10-CM | POA: Insufficient documentation

## 2022-10-25 DIAGNOSIS — J690 Pneumonitis due to inhalation of food and vomit: Principal | ICD-10-CM | POA: Insufficient documentation

## 2022-10-25 DIAGNOSIS — R059 Cough, unspecified: Secondary | ICD-10-CM | POA: Diagnosis not present

## 2022-10-25 DIAGNOSIS — I10 Essential (primary) hypertension: Secondary | ICD-10-CM | POA: Diagnosis not present

## 2022-10-25 DIAGNOSIS — Z96652 Presence of left artificial knee joint: Secondary | ICD-10-CM | POA: Insufficient documentation

## 2022-10-25 DIAGNOSIS — R0902 Hypoxemia: Secondary | ICD-10-CM | POA: Diagnosis not present

## 2022-10-25 DIAGNOSIS — F0280 Dementia in other diseases classified elsewhere without behavioral disturbance: Secondary | ICD-10-CM | POA: Insufficient documentation

## 2022-10-25 DIAGNOSIS — J189 Pneumonia, unspecified organism: Secondary | ICD-10-CM | POA: Diagnosis not present

## 2022-10-25 DIAGNOSIS — J9611 Chronic respiratory failure with hypoxia: Secondary | ICD-10-CM | POA: Diagnosis not present

## 2022-10-25 DIAGNOSIS — Z743 Need for continuous supervision: Secondary | ICD-10-CM | POA: Diagnosis not present

## 2022-10-25 DIAGNOSIS — G20C Parkinsonism, unspecified: Secondary | ICD-10-CM | POA: Insufficient documentation

## 2022-10-25 DIAGNOSIS — R1111 Vomiting without nausea: Secondary | ICD-10-CM | POA: Diagnosis not present

## 2022-10-25 DIAGNOSIS — I959 Hypotension, unspecified: Secondary | ICD-10-CM | POA: Diagnosis not present

## 2022-10-25 DIAGNOSIS — Z1152 Encounter for screening for COVID-19: Secondary | ICD-10-CM | POA: Insufficient documentation

## 2022-10-25 DIAGNOSIS — Z7982 Long term (current) use of aspirin: Secondary | ICD-10-CM | POA: Diagnosis not present

## 2022-10-25 DIAGNOSIS — R69 Illness, unspecified: Secondary | ICD-10-CM | POA: Diagnosis not present

## 2022-10-25 DIAGNOSIS — R0689 Other abnormalities of breathing: Secondary | ICD-10-CM | POA: Diagnosis not present

## 2022-10-25 DIAGNOSIS — Z79899 Other long term (current) drug therapy: Secondary | ICD-10-CM | POA: Diagnosis not present

## 2022-10-25 NOTE — ED Triage Notes (Signed)
Pt was given her nightly med (liquid) and has been "choking" since. She did eat supper without incident.  Has continued to cough and produce copious amounts of sputum but has not vomited up her supper.  Son reports this is "normal" for her. Reports she just spent about 12 days inpatient here and went home last weekend.  She was taken off of heparin at discharge.

## 2022-10-26 ENCOUNTER — Other Ambulatory Visit: Payer: Self-pay

## 2022-10-26 ENCOUNTER — Observation Stay
Admission: EM | Admit: 2022-10-26 | Discharge: 2022-10-26 | Disposition: A | Discharge status: Home / Self Care | Payer: Medicare HMO | Attending: Hospitalist | Admitting: Hospitalist

## 2022-10-26 DIAGNOSIS — F0390 Unspecified dementia without behavioral disturbance: Secondary | ICD-10-CM | POA: Diagnosis not present

## 2022-10-26 DIAGNOSIS — G20A1 Parkinson's disease without dyskinesia, without mention of fluctuations: Secondary | ICD-10-CM | POA: Diagnosis present

## 2022-10-26 DIAGNOSIS — Z7401 Bed confinement status: Secondary | ICD-10-CM | POA: Diagnosis not present

## 2022-10-26 DIAGNOSIS — I959 Hypotension, unspecified: Secondary | ICD-10-CM | POA: Diagnosis not present

## 2022-10-26 DIAGNOSIS — Z743 Need for continuous supervision: Secondary | ICD-10-CM | POA: Diagnosis not present

## 2022-10-26 DIAGNOSIS — R69 Illness, unspecified: Secondary | ICD-10-CM | POA: Diagnosis not present

## 2022-10-26 DIAGNOSIS — J690 Pneumonitis due to inhalation of food and vomit: Secondary | ICD-10-CM | POA: Diagnosis present

## 2022-10-26 DIAGNOSIS — J9611 Chronic respiratory failure with hypoxia: Secondary | ICD-10-CM

## 2022-10-26 DIAGNOSIS — R0689 Other abnormalities of breathing: Secondary | ICD-10-CM | POA: Diagnosis not present

## 2022-10-26 DIAGNOSIS — Z86711 Personal history of pulmonary embolism: Secondary | ICD-10-CM | POA: Insufficient documentation

## 2022-10-26 DIAGNOSIS — T17908A Unspecified foreign body in respiratory tract, part unspecified causing other injury, initial encounter: Principal | ICD-10-CM

## 2022-10-26 DIAGNOSIS — J189 Pneumonia, unspecified organism: Secondary | ICD-10-CM

## 2022-10-26 DIAGNOSIS — I10 Essential (primary) hypertension: Secondary | ICD-10-CM | POA: Diagnosis present

## 2022-10-26 LAB — RESPIRATORY PANEL BY PCR

## 2022-10-26 LAB — BASIC METABOLIC PANEL
Anion gap: 8 (ref 5–15)
BUN: 17 mg/dL (ref 8–23)
CO2: 28 mmol/L (ref 22–32)
Calcium: 8.5 mg/dL — ABNORMAL LOW (ref 8.9–10.3)
Chloride: 99 mmol/L (ref 98–111)
Creatinine, Ser: 0.73 mg/dL (ref 0.44–1.00)
GFR, Estimated: >60 mL/min (ref >60)
Glucose, Bld: 137 mg/dL — ABNORMAL HIGH (ref 70–99)
Potassium: 3.9 mmol/L (ref 3.5–5.1)
Sodium: 135 mmol/L (ref 135–145)

## 2022-10-26 LAB — CBC WITH DIFFERENTIAL/PLATELET
Abs Immature Granulocytes: 0.04 10*3/uL (ref 0.00–0.07)
Basophils Absolute: 0.1 10*3/uL (ref 0.0–0.1)
Basophils Relative: 1 %
Eosinophils Absolute: 0.3 10*3/uL (ref 0.0–0.5)
Eosinophils Relative: 2 %
HCT: 36.7 % (ref 36.0–46.0)
Hemoglobin: 11.6 g/dL — ABNORMAL LOW (ref 12.0–15.0)
Immature Granulocytes: 0 %
Lymphocytes Relative: 31 %
Lymphs Abs: 3.4 10*3/uL (ref 0.7–4.0)
MCH: 31.4 pg (ref 26.0–34.0)
MCHC: 31.6 g/dL (ref 30.0–36.0)
MCV: 99.5 fL (ref 80.0–100.0)
Monocytes Absolute: 1.7 10*3/uL — ABNORMAL HIGH (ref 0.1–1.0)
Monocytes Relative: 15 %
Neutro Abs: 5.6 10*3/uL (ref 1.7–7.7)
Neutrophils Relative %: 51 %
Platelets: 259 10*3/uL (ref 150–400)
RBC: 3.69 MIL/uL — ABNORMAL LOW (ref 3.87–5.11)
RDW: 14.4 % (ref 11.5–15.5)
WBC: 11.1 10*3/uL — ABNORMAL HIGH (ref 4.0–10.5)
nRBC: 0 % (ref 0.0–0.2)

## 2022-10-26 LAB — SARS CORONAVIRUS 2 BY RT PCR: SARS Coronavirus 2 by RT PCR: NEGATIVE

## 2022-10-26 LAB — LACTIC ACID, PLASMA
Lactic Acid, Venous: 2 mmol/L (ref 0.5–1.9)
Lactic Acid, Venous: 2.1 mmol/L (ref 0.5–1.9)

## 2022-10-26 LAB — TROPONIN I (HIGH SENSITIVITY): Troponin I (High Sensitivity): 23 ng/L — ABNORMAL HIGH (ref <18)

## 2022-10-26 LAB — PROCALCITONIN: Procalcitonin: <0.1 ng/mL

## 2022-10-26 LAB — BRAIN NATRIURETIC PEPTIDE: B Natriuretic Peptide: 70 pg/mL (ref 0.0–100.0)

## 2022-10-26 MED ORDER — MELATONIN 5 MG PO TABS
5.0000 mg | ORAL_TABLET | Freq: Every day | ORAL | Status: DC
  Administered 2022-10-26: 5 mg via ORAL
  Filled 2022-10-26: qty 1

## 2022-10-26 MED ORDER — CARBIDOPA-LEVODOPA-ENTACAPONE 50-200-200 MG PO TABS: 1.0000 | ORAL_TABLET | Freq: Every day | ORAL | Status: DC

## 2022-10-26 MED ORDER — CARBIDOPA-LEVODOPA ER 50-200 MG PO TBCR
1.0000 | EXTENDED_RELEASE_TABLET | Freq: Every day | ORAL | Status: DC
  Filled 2022-10-26: qty 1

## 2022-10-26 MED ORDER — SODIUM CHLORIDE 0.9 % IV SOLN: INTRAVENOUS | Status: DC

## 2022-10-26 MED ORDER — PANTOPRAZOLE SODIUM 40 MG PO TBEC
40.0000 mg | DELAYED_RELEASE_TABLET | Freq: Two times a day (BID) | ORAL | Status: DC
  Administered 2022-10-26: 40 mg via ORAL
  Filled 2022-10-26: qty 1

## 2022-10-26 MED ORDER — POLYETHYLENE GLYCOL 3350 17 G PO PACK: 17.0000 g | PACK | Freq: Every day | ORAL | Status: DC | PRN

## 2022-10-26 MED ORDER — ENOXAPARIN SODIUM 40 MG/0.4ML IJ SOSY
40.0000 mg | PREFILLED_SYRINGE | INTRAMUSCULAR | Status: DC
  Administered 2022-10-26: 40 mg via SUBCUTANEOUS
  Filled 2022-10-26: qty 0.4

## 2022-10-26 MED ORDER — DULOXETINE HCL 60 MG PO CPEP
60.0000 mg | ORAL_CAPSULE | Freq: Every day | ORAL | Status: DC
  Administered 2022-10-26: 60 mg via ORAL
  Filled 2022-10-26: qty 1

## 2022-10-26 MED ORDER — DONEPEZIL HCL 5 MG PO TABS: 10.0000 mg | ORAL_TABLET | Freq: Every day | ORAL | Status: DC

## 2022-10-26 MED ORDER — LEVOFLOXACIN IN D5W 750 MG/150ML IV SOLN: 750.0000 mg | INTRAVENOUS | Status: DC

## 2022-10-26 MED ORDER — LEVOFLOXACIN IN D5W 750 MG/150ML IV SOLN
750.0000 mg | Freq: Once | INTRAVENOUS | Status: AC
  Administered 2022-10-26: 750 mg via INTRAVENOUS
  Filled 2022-10-26: qty 150

## 2022-10-26 MED ORDER — ENTACAPONE 200 MG PO TABS
200.0000 mg | ORAL_TABLET | Freq: Every day | ORAL | Status: DC
  Filled 2022-10-26: qty 1

## 2022-10-26 MED ORDER — ONDANSETRON HCL 4 MG PO TABS: 4.0000 mg | ORAL_TABLET | Freq: Four times a day (QID) | ORAL | Status: DC | PRN

## 2022-10-26 MED ORDER — ATORVASTATIN CALCIUM 20 MG PO TABS: 20.0000 mg | ORAL_TABLET | Freq: Every day | ORAL | Status: DC

## 2022-10-26 MED ORDER — PANTOPRAZOLE SODIUM 40 MG PO TBEC: 40.0000 mg | DELAYED_RELEASE_TABLET | Freq: Two times a day (BID) | ORAL | 2 refills | Status: AC

## 2022-10-26 MED ORDER — MEMANTINE HCL 5 MG PO TABS
10.0000 mg | ORAL_TABLET | Freq: Two times a day (BID) | ORAL | Status: DC
  Administered 2022-10-26 (×2): 10 mg via ORAL
  Filled 2022-10-26 (×2): qty 2

## 2022-10-26 MED ORDER — ACETAMINOPHEN 325 MG RE SUPP: 650.0000 mg | Freq: Four times a day (QID) | RECTAL | Status: DC | PRN

## 2022-10-26 MED ORDER — CARBIDOPA-LEVODOPA 25-100 MG PO TABS
2.0000 | ORAL_TABLET | Freq: Three times a day (TID) | ORAL | Status: DC
  Administered 2022-10-26 (×2): 2 via ORAL
  Filled 2022-10-26 (×3): qty 2

## 2022-10-26 MED ORDER — METOPROLOL TARTRATE 25 MG PO TABS
25.0000 mg | ORAL_TABLET | Freq: Two times a day (BID) | ORAL | Status: DC
  Administered 2022-10-26: 25 mg via ORAL
  Filled 2022-10-26: qty 1

## 2022-10-26 MED ORDER — ONDANSETRON HCL 4 MG/2ML IJ SOLN: 4.0000 mg | Freq: Four times a day (QID) | INTRAMUSCULAR | Status: DC | PRN

## 2022-10-26 MED ORDER — ACETAMINOPHEN 325 MG PO TABS: 650.0000 mg | ORAL_TABLET | Freq: Four times a day (QID) | ORAL | Status: DC | PRN

## 2022-10-26 NOTE — Assessment & Plan Note (Signed)
Patient had bilateral PE during her hospitalization in October 2023.  Family declined anticoagulation

## 2022-10-26 NOTE — Assessment & Plan Note (Signed)
Continue donepezil, duloxetine, memantine.  Will hold pregabalin

## 2022-10-26 NOTE — Assessment & Plan Note (Signed)
Continue carbidopa-levodopa

## 2022-10-26 NOTE — ED Notes (Signed)
Pt brought to ED 5 at this time, this RN now assuming care.

## 2022-10-26 NOTE — TOC Transition Note (Signed)
Transition of Care [facility]) - CM/SW Discharge Note   Patient Details  Name: VIVICA DOBOSZ MRN: [id number] Date of Birth: 1934/09/20  [facility]) CM/SW Contact:  Shelbie Hutching, RN Phone Number: 10/26/2022, 11:48 AM   Clinical Narrative:    Patient seen in the hospital and placed under observation, now medically cleared for discharge back to [facility].  Patient is open with [facility] for PT and OT, Meg notified of discharge.  RNCM called [facility], [phone] and notified them patient returning.  FL2 will be sent with patient.   Patient will need EMS transport back to facility.  ED secretary will arrange EMS.    Final next level of care: Assisted Living Barriers to Discharge: Barriers Resolved   Patient Goals and CMS Choice Patient states their goals for this hospitalization and ongoing recovery are:: Return to ALF      Discharge Placement              Patient chooses bed at: Other - please specify in the comment section below: [facility]) Patient to be transferred to facility by: [facility] Name of family member notified: Mahaila Tischer Patient and family notified of of transfer: 10/26/22  Discharge Plan and Services   Discharge Planning Services: CM Consult            DME Arranged: N/A         HH Arranged: PT, OT HH Agency: [place] Date [place]: 10/26/22 Time [place]: 2224 Representative spoke with at [place]: Meg  Social Determinants of Health ([place]) Interventions     Readmission Risk Interventions     No data to display

## 2022-10-26 NOTE — ED Notes (Signed)
Pt keeps saying "oh lord, oh lord" and spitting. Son sts this is normal for her and he says she insists there is something in her mouth all the time. He sts she does this frequently. Pt has been reoriented that nothing is in her mouth right now.

## 2022-10-26 NOTE — ED Notes (Signed)
ED Provider at bedside. 

## 2022-10-26 NOTE — Assessment & Plan Note (Addendum)
Chronic respiratory failure with hypoxia Patient with persistent coughing following her supper with chest x-ray diffuse bilateral interstitial pulmonary opacity consistent with edema or atypical/viral infection Continue Levaquin Follow cultures Aspiration precautions and speech therapy eval We will keep n.p.o. tonight
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
## 2022-10-26 NOTE — Care Management CC44 (Signed)
Condition Code 44 Documentation Completed  Patient Details  Name: CANDISE CRABTREE MRN: [id number] Date of Birth: 06/23/1934   Condition Code 44 given:  Yes Patient signature on Condition Code 44 notice:  Yes Documentation of 2 MD's agreement:  Yes Code 44 added to claim:  Yes    Shelbie Hutching, RN 10/26/2022, 11:30 AM
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
## 2022-10-26 NOTE — Assessment & Plan Note (Signed)
Patient was hypotensive on arrival so we will hold metoprolol for tonight

## 2022-10-26 NOTE — Discharge Summary (Signed)
Physician Discharge Summary   Morgan Dennis  female DOB: [birth year]  GUR:[id number]  PCP: Jerrol Banana., MD  Admit date: 10/26/2022 Discharge date: 10/26/2022  Admitted From: ALF Disposition:  ALF Updated son [name] on the phone prior to discharge. CODE STATUS: DNR   Hospital Course:  For full details, please see H&P, progress notes, consult notes and ancillary notes.  Briefly,  Morgan GARTLAND is a 88 y.o. female with medical history significant for Hypertension, dementia, Parkinson's disease, recently hospitalized from 10/21 to 11/3 with septic shock secondary to colitis complicated by HCAP and bilateral PE, discharged on 2L O2, not on anticoagulants as family declined, who was sent to the ED for evaluation of a choking episode while taking liquid medicine (likely to be nystatin).  * Aspiration pneumonia ([place]), ruled out --no strong evidence of bacterial PNA.  Current CXR appeared similar to CXR from 10/18/22.  Procal neg.  No fever, WBC mildly elevated at 11.1.  No O2 desat on home 2L O2. --SLP evaluated, and found pt to have intact swallow function.    Chronic cough and throat clearing Son reported pt has a chronic behavioral habit of frequent cough to clear her throat that's been going on for years.  This behavior is likely exacerbated by her choking episode.  Pt may have issue with esophageal regurgitation, therefore SLP recommended PPI BID to see if that will improve her throat clearing behavior. --Home Pepcid switched to protonix 40 mg BID for 3 months.  If no improvement in symptoms after 3 months, then may consider not continuing PPI. --liquid nystatin d/c'ed.    Chronic respiratory failure with hypoxia --No O2 desat on home 2L O2.   Essential hypertension --cont home metop   Parkinson's disease Continue home carbidopa-levodopa   Dementia without behavioral disturbance (HCC) Continue donepezil, duloxetine, memantine.   History of pulmonary embolism Patient had  bilateral PE during her hospitalization in October 2023.  Family declined anticoagulation   Discharge Diagnoses:  Principal Problem:   Aspiration pneumonia ([place]) Active Problems:   Chronic respiratory failure with hypoxia ([place])   Essential hypertension   Parkinson's disease   Dementia without behavioral disturbance ([place])   History of pulmonary embolism   30 Day Unplanned Readmission Risk Score    Flowsheet Row ED from 10/26/2022 in [facility]  30 Day Unplanned Readmission Risk Score (%) 28.1 Filed at 10/26/2022 0801       This score is the patient's risk of an unplanned readmission within 30 days of being discharged (0 -100%). The score is based on dignosis, age, lab data, medications, orders, and past utilization.   Low:  0-14.9   Medium: 15-21.9   High: 22-29.9   Extreme: 30 and above         Discharge Instructions:  Allergies as of 10/26/2022       Reactions   Penicillins Swelling, Rash   Other reaction(s): Rash Has patient had a PCN reaction causing immediate rash, facial/tongue/throat swelling, SOB or lightheadedness with hypotension: Yes Has patient had a PCN reaction causing severe rash involving mucus membranes or skin necrosis: Unknown Has patient had a PCN reaction that required hospitalization:Yes Has patient had a PCN reaction occurring within the last 10 years: No If all of the above answers are "NO", then may proceed with Cephalosporin use.   Evista  [raloxifene]    Other reaction(s): Joint Pains   Ibandronic Acid    Other reaction(s): Muscle Pain   Remicade [  infliximab] Other (See Comments)   Due to bleeding ulcer issue--MD advised her not to take   Risedronate Sodium    Other reaction(s): Joint Pains        Medication List     STOP taking these medications    famotidine 20 MG tablet Commonly known as: PEPCID   nystatin 100000 UNIT/ML suspension Commonly known as: MYCOSTATIN       TAKE these  medications    acetaminophen 325 MG tablet Commonly known as: TYLENOL Take 2 tablets (650 mg total) by mouth every 6 (six) hours as needed for mild pain (or Fever >/= 101).   aspirin 81 MG chewable tablet Chew 1 tablet (81 mg total) by mouth daily.   atorvastatin 20 MG tablet Commonly known as: LIPITOR Take 20 mg by mouth at bedtime.   carbidopa-levodopa 50-200 MG tablet Commonly known as: SINEMET CR Take 1 tablet by mouth at bedtime.   carbidopa-levodopa 25-100 MG tablet Commonly known as: SINEMET IR Take 2 tablets by mouth 3 (three) times daily.   denosumab 60 MG/ML Sosy injection Commonly known as: PROLIA Inject 60 mg into the skin every 6 (six) months.   diclofenac Sodium 1 % Gel Commonly known as: VOLTAREN Apply 4 g topically 3 (three) times daily. (Apply to knees)   docusate sodium 100 MG capsule Commonly known as: COLACE Take 100 mg by mouth every Monday, Wednesday, and Friday.   donepezil 10 MG tablet Commonly known as: ARICEPT Take 10 mg by mouth at bedtime.   DULoxetine 60 MG capsule Commonly known as: CYMBALTA Take 60 mg by mouth daily.   folic acid 409 MCG tablet Commonly known as: FOLVITE Take 800 mcg by mouth daily.   HYDROcodone-acetaminophen 7.5-325 MG tablet Commonly known as: NORCO Take 1 tablet by mouth in the morning and at bedtime.   lanolin/mineral oil Lotn Apply 1 Application topically daily. (Apply to legs)   melatonin 5 MG Tabs Take 5 mg by mouth at bedtime.   memantine 10 MG tablet Commonly known as: NAMENDA Take 10 mg by mouth 2 (two) times daily.   methotrexate 2.5 MG tablet Commonly known as: RHEUMATREX Take 15 mg by mouth every Wednesday.   metoprolol tartrate 25 MG tablet Commonly known as: LOPRESSOR Take 1 tablet (25 mg total) by mouth 2 (two) times daily.   Minerin Creme Crea Apply 1 Application topically daily. (Apply to legs)   multivitamin with minerals Tabs tablet Take 1 tablet by mouth daily.   pantoprazole  40 MG tablet Commonly known as: PROTONIX Take 1 tablet (40 mg total) by mouth 2 (two) times daily.   polyethylene glycol 17 g packet Commonly known as: MiraLax Take 17 g by mouth daily as needed for moderate constipation or severe constipation. 3 times a week, Monday, Wednesday and Friday   pregabalin 150 MG capsule Commonly known as: LYRICA Take 150 mg by mouth 2 (two) times daily.   solifenacin 5 MG tablet Commonly known as: VESICARE Take 1 tablet (5 mg total) by mouth daily.   vitamin E 180 MG (400 UNITS) capsule Take 400 Units by mouth daily.         Follow-up Information     Jerrol Banana., MD Follow up in 1 week(s).   Specialty: Family Medicine Contact information: [address] [place] [address] [phone]                 Allergies  Allergen Reactions   Penicillins Swelling and Rash  Other reaction(s): Rash Has patient had a PCN reaction causing immediate rash, facial/tongue/throat swelling, SOB or lightheadedness with hypotension: Yes Has patient had a PCN reaction causing severe rash involving mucus membranes or skin necrosis: Unknown Has patient had a PCN reaction that required hospitalization:Yes Has patient had a PCN reaction occurring within the last 10 years: No If all of the above answers are "NO", then may proceed with Cephalosporin use.    Evista  [Raloxifene]     Other reaction(s): Joint Pains   Ibandronic Acid     Other reaction(s): Muscle Pain   Remicade [Infliximab] Other (See Comments)    Due to bleeding ulcer issue--MD advised her not to take   Risedronate Sodium     Other reaction(s): Joint Pains     The results of significant diagnostics from this hospitalization (including imaging, microbiology, ancillary and laboratory) are listed below for reference.   Consultations:   Procedures/Studies: DG Chest Portable 1 View  Result Date: 10/25/2022 CLINICAL DATA:  Cough EXAM: PORTABLE CHEST 1 VIEW  COMPARISON:  10/18/2022 FINDINGS: The heart size and mediastinal contours are within normal limits. Diffuse bilateral interstitial pulmonary opacity. The visualized skeletal structures are unremarkable. IMPRESSION: Diffuse bilateral interstitial pulmonary opacity, consistent with edema or atypical/viral infection. No focal airspace opacity. Electronically Signed   By: Delanna Ahmadi M.D.   On: 10/25/2022 20:26   DG Chest 1 View  Result Date: 10/18/2022 CLINICAL DATA:  Cough. EXAM: CHEST  1 VIEW COMPARISON:  Chest x-ray dated October 08, 2022. FINDINGS: Interval removal of the right internal jugular central venous catheter. Unchanged borderline cardiomegaly. Low lung volumes are present, causing crowding of the pulmonary vasculature. Left basilar atelectasis. No focal consolidation, pleural effusion, or pneumothorax. No acute osseous abnormality. IMPRESSION: 1. Low lung volumes with left basilar atelectasis. Electronically Signed   By: Titus Dubin M.D.   On: 10/18/2022 10:04   US Venous Img Upper Bilat (DVT)  Result Date: 10/08/2022 CLINICAL DATA:  [id number] Dyspnea [id number], possible PE on recent CTA chest EXAM: BILATERAL UPPER EXTREMITY VENOUS DOPPLER ULTRASOUND TECHNIQUE: Gray-scale sonography with graded compression, as well as color Doppler and duplex ultrasound were performed to evaluate the bilateral upper extremity deep venous systems from the level of the subclavian vein and including the jugular, axillary, basilic, radial, ulnar and upper cephalic vein. Spectral Doppler was utilized to evaluate flow at rest and with distal augmentation maneuvers. COMPARISON:  None Available. FINDINGS: RIGHT UPPER EXTREMITY Internal Jugular Vein: Not evaluated due to presence of central venous catheter with bleeding. Subclavian Vein: No evidence of thrombus. Normal compressibility, respiratory phasicity and response to augmentation. Axillary Vein: No evidence of thrombus. Normal compressibility, respiratory phasicity  and response to augmentation. Cephalic Vein: No evidence of thrombus. Normal compressibility, respiratory phasicity and response to augmentation. Basilic Vein: No evidence of thrombus. Normal compressibility, respiratory phasicity and response to augmentation. Brachial Veins: No evidence of thrombus. Normal compressibility, respiratory phasicity and response to augmentation. Radial Veins: No evidence of thrombus. Normal compressibility, respiratory phasicity and response to augmentation. Ulnar Veins: No evidence of thrombus. Normal compressibility, respiratory phasicity and response to augmentation. Venous Reflux:  None. Other Findings:  None. LEFT UPPER EXTREMITY Internal Jugular Vein: No evidence of thrombus. Normal compressibility, respiratory phasicity and response to augmentation. Subclavian Vein: No evidence of thrombus. Normal compressibility, respiratory phasicity and response to augmentation. Axillary Vein: No evidence of thrombus. Normal compressibility, respiratory phasicity and response to augmentation. Cephalic Vein: No evidence of thrombus. Normal compressibility, respiratory phasicity and response to augmentation.  Basilic Vein: No evidence of thrombus. Normal compressibility, respiratory phasicity and response to augmentation. Brachial Veins: No evidence of thrombus. Normal compressibility, respiratory phasicity and response to augmentation. Radial Veins: No evidence of thrombus. Normal compressibility, respiratory phasicity and response to augmentation. Ulnar Veins: No evidence of thrombus. Normal compressibility, respiratory phasicity and response to augmentation. Venous Reflux:  None. Other Findings:  None. IMPRESSION: No evidence of DVT within either upper extremity. Electronically Signed   By: Lucrezia Europe M.D.   On: 10/08/2022 14:10   US Venous Img Lower Bilateral (DVT)  Result Date: 10/08/2022 CLINICAL DATA:  Dyspnea, concern of pulmonary embolism EXAM: BILATERAL LOWER EXTREMITY VENOUS  DOPPLER ULTRASOUND TECHNIQUE: Gray-scale sonography with compression, as well as color and duplex ultrasound, were performed to evaluate the deep venous system(s) from the level of the common femoral vein through the popliteal and proximal calf veins. COMPARISON:  None Available. FINDINGS: VENOUS Normal compressibility of the common femoral, superficial femoral, and popliteal veins, as well as the visualized calf veins. Visualized portions of profunda femoral vein and great saphenous vein unremarkable. No filling defects to suggest DVT on grayscale or color Doppler imaging. Doppler waveforms show normal direction of venous flow, normal respiratory plasticity and response to augmentation. OTHER None. Limitations: Technologist describes technically difficult study secondary to patient movement. IMPRESSION: Negative. Electronically Signed   By: Lucrezia Europe M.D.   On: 10/08/2022 10:39   DG Chest Port 1 View  Result Date: 10/08/2022 CLINICAL DATA:  Central venous catheter placement EXAM: PORTABLE CHEST 1 VIEW COMPARISON:  10/07/2022 FINDINGS: Right internal jugular central venous catheter is in place with its tip at the superior cavoatrial junction. Lung volumes are small. Diffuse asymmetric pulmonary infiltrate may relate to atelectasis or asymmetric pulmonary edema. No pneumothorax or pleural effusion. Cardiac size within normal limits. No acute bone abnormality. IMPRESSION: 1. Right internal jugular central venous catheter in appropriate position. No pneumothorax. 2. Pulmonary hypoinflation. Electronically Signed   By: Fidela Salisbury M.D.   On: 10/08/2022 03:54   CT Angio Chest Pulmonary Embolism (PE) W or WO Contrast  Result Date: 10/08/2022 CLINICAL DATA:  Pulmonary embolism (PE) suspected, high prob EXAM: CT ANGIOGRAPHY CHEST WITH CONTRAST TECHNIQUE: Multidetector CT imaging of the chest was performed using the standard protocol during bolus administration of intravenous contrast. Multiplanar CT image  reconstructions and MIPs were obtained to evaluate the vascular anatomy. RADIATION DOSE REDUCTION: This exam was performed according to the departmental dose-optimization program which includes automated exposure control, adjustment of the mA and/or kV according to patient size and/or use of iterative reconstruction technique. CONTRAST:  64m OMNIPAQUE IOHEXOL 350 MG/ML SOLN COMPARISON:  10/17/2019 FINDINGS: Cardiovascular: Moderate coronary artery calcification. Extensive calcification of the aortic valve leaflets. Global cardiac size is within normal limits though left ventricular hypertrophy is noted. No pericardial effusion. Opacification the pulmonary arterial tree is slightly suboptimal despite repeat injection, however, there are tiny intraluminal branching filling defects identified within the posterior basal and left anteromedial segmental pulmonary arteries of the lower lobes bilaterally in keeping with acute pulmonary emboli. The embolic burden is tiny. The central pulmonary arteries are of normal caliber. No CT evidence of right heart strain. Mild atherosclerotic calcification within the thoracic aorta. Mediastinum/Nodes: No pathologic thoracic adenopathy. Esophagus is unremarkable. Lungs/Pleura: Patchy multifocal ground-glass opacity likely relates to atelectasis on this expiratory phase images. Marked narrowing of the trachea and mainstem bronchi bilaterally are in keeping with changes of tracheobronchomalacia. No pneumothorax or pleural effusion. Upper Abdomen: No acute abnormality. Musculoskeletal: No  acute bone abnormality. Probable T8 and T11 vertebral hemangiomas. Review of the MIP images confirms the above findings. IMPRESSION: 1. Tiny acute pulmonary emboli within the lower lobes bilaterally. The embolic burden is tiny. No CT evidence of right heart strain. 2. Moderate coronary artery calcification. 3. Extensive calcification of the aortic valve leaflets. Echocardiography may be helpful to  assess the degree of valvular dysfunction 4. Left ventricular hypertrophy. 5. Tracheobronchomalacia. Aortic Atherosclerosis (ICD10-I70.0). Electronically Signed   By: Fidela Salisbury M.D.   On: 10/08/2022 00:41   CT HEAD WO CONTRAST (5MM)  Result Date: 10/08/2022 CLINICAL DATA:  Syncope. EXAM: CT HEAD WITHOUT CONTRAST TECHNIQUE: Contiguous axial images were obtained from the base of the skull through the vertex without intravenous contrast. RADIATION DOSE REDUCTION: This exam was performed according to the departmental dose-optimization program which includes automated exposure control, adjustment of the mA and/or kV according to patient size and/or use of iterative reconstruction technique. COMPARISON:  Head CT dated 03/08/2022. FINDINGS: Brain: Mild age-related atrophy and chronic microvascular ischemic changes. There is no acute intracranial hemorrhage. No mass effect or midline shift. No extra-axial fluid collection. Vascular: No hyperdense vessel or unexpected calcification. Skull: Normal. Negative for fracture or focal lesion. Sinuses/Orbits: Mild diffuse mucoperiosteal thickening of paranasal sinuses. No air-fluid. The mastoid air cells are clear. Other: None IMPRESSION: 1. No acute intracranial pathology. 2. Mild age-related atrophy and chronic microvascular ischemic changes. Electronically Signed   By: Anner Crete M.D.   On: 10/08/2022 00:34   CT ABDOMEN PELVIS WO CONTRAST  Result Date: 10/07/2022 CLINICAL DATA:  Abdominal pain. Patient states she does not feel well. Question sepsis. EXAM: CT ABDOMEN AND PELVIS WITHOUT CONTRAST TECHNIQUE: Multidetector CT imaging of the abdomen and pelvis was performed following the standard protocol without IV contrast. RADIATION DOSE REDUCTION: This exam was performed according to the departmental dose-optimization program which includes automated exposure control, adjustment of the mA and/or kV according to patient size and/or use of iterative  reconstruction technique. COMPARISON:  CT of the abdomen and pelvis 06/03/2022. Two-view chest x-ray 10/07/2022. FINDINGS: Lower chest: Patchy airspace opacities are present at the lung bases bilaterally. Airways are patent. Heart size is normal. No significant pleural or pericardial effusion is present. Hepatobiliary: No focal liver abnormality is seen. No gallstones, gallbladder wall thickening, or biliary dilatation. Pancreas: Unremarkable. No pancreatic ductal dilatation or surrounding inflammatory changes. Spleen: Punctate calcifications are present. No discrete lesion is present. Adrenals/Urinary Tract: Adrenal glands are normal bilaterally. Kidneys are unremarkable. No stone or mass lesion is present. No obstruction is present. Ureters are within normal limits. The urinary bladder is normal. Stomach/Bowel: Stomach and duodenum are within normal limits. Small bowel is unremarkable. Terminal ileum is within normal limits. The appendix is not discretely visualized and may be surgically absent. The ascending and proximal transverse colon are fluid-filled. Distal transverse and descending colon are within normal limits. Diverticular changes are present in the sigmoid colon without focal inflammation to suggest diverticulitis. Vascular/Lymphatic: Atherosclerotic calcifications are present the aorta and branch vessels without aneurysm. Reproductive: Posterior calcified uterine fibroid noted. Uterus and adnexa otherwise normal for age. Other: A 14 mm paraumbilical hernia contains fat without bowel. Free fluid or free air is present. Musculoskeletal: Grade 1 anterolisthesis at L3-4 is stable. Spinal augmentation again noted at L1. Prominent hemangioma is present at T11 a smaller hemangioma T8. Vertebral body heights are otherwise maintained. Degenerative changes are noted at the SI joints. Bony pelvis is otherwise normal. The hips are located and within normal limits  bilaterally. IMPRESSION: 1. Patchy airspace  opacities at the lung bases bilaterally are concerning for pneumonia or aspiration 2. Fluid-filled ascending and proximal transverse colon without obstruction. This may represent a nonspecific colitis or diarrhea. 3. Sigmoid diverticulosis without diverticulitis. 4. Stable grade 1 anterolisthesis at L3-4. 5. Spinal augmentation at L1. 6.  Aortic Atherosclerosis (ICD10-I70.0). Electronically Signed   By: San Morelle M.D.   On: 10/07/2022 17:22   DG Chest 2 View  Result Date: 10/07/2022 CLINICAL DATA:  Possible sepsis.  Syncope. EXAM: CHEST - 2 VIEW COMPARISON:  08/19/2022 and prior radiographs FINDINGS: The cardiomediastinal silhouette is unremarkable. Mild chronic peribronchial thickening again noted. There is no evidence of focal airspace disease, pulmonary edema, suspicious pulmonary nodule/mass, pleural effusion, or pneumothorax. No acute bony abnormalities are identified. IMPRESSION: 1. No active cardiopulmonary disease. 2. Mild chronic peribronchial thickening. Electronically Signed   By: Margarette Canada M.D.   On: 10/07/2022 15:12      Labs: BNP (last 3 results) Recent Labs    08/17/22 1122 10/07/22 1514 10/26/22 0053  BNP 108.1* 132.5* 25.0   Basic Metabolic Panel: Recent Labs  Lab 10/26/22 0053  NA 135  K 3.9  CL 99  CO2 28  GLUCOSE 137*  BUN 17  CREATININE 0.73  CALCIUM 8.5*   Liver Function Tests: No results for input(s): "AST", "ALT", "ALKPHOS", "BILITOT", "PROT", "ALBUMIN" in the last 168 hours. No results for input(s): "LIPASE", "AMYLASE" in the last 168 hours. No results for input(s): "AMMONIA" in the last 168 hours. CBC: Recent Labs  Lab 10/26/22 0053  WBC 11.1*  NEUTROABS 5.6  HGB 11.6*  HCT 36.7  MCV 99.5  PLT 259   Cardiac Enzymes: No results for input(s): "CKTOTAL", "CKMB", "CKMBINDEX", "TROPONINI" in the last 168 hours. BNP: Invalid input(s): "POCBNP" CBG: No results for input(s): "GLUCAP" in the last 168 hours. D-Dimer No results for  input(s): "DDIMER" in the last 72 hours. Hgb A1c No results for input(s): "HGBA1C" in the last 72 hours. Lipid Profile No results for input(s): "CHOL", "HDL", "LDLCALC", "TRIG", "CHOLHDL", "LDLDIRECT" in the last 72 hours. Thyroid function studies No results for input(s): "TSH", "T4TOTAL", "T3FREE", "THYROIDAB" in the last 72 hours.  Invalid input(s): "FREET3" Anemia work up No results for input(s): "VITAMINB12", "FOLATE", "FERRITIN", "TIBC", "IRON", "RETICCTPCT" in the last 72 hours. Urinalysis    Component Value Date/Time   COLORURINE YELLOW (A) 10/07/2022 1645   APPEARANCEUR HAZY (A) 10/07/2022 1645   LABSPEC 1.005 10/07/2022 1645   PHURINE 8.0 10/07/2022 1645   GLUCOSEU NEGATIVE 10/07/2022 1645   HGBUR NEGATIVE 10/07/2022 1645   BILIRUBINUR NEGATIVE 10/07/2022 1645   BILIRUBINUR Negative 10/20/2020 [place] 10/07/2022 1645   PROTEINUR 30 (A) 10/07/2022 1645   UROBILINOGEN 0.2 10/20/2020 1442   NITRITE NEGATIVE 10/07/2022 1645   LEUKOCYTESUR NEGATIVE 10/07/2022 1645   Sepsis Labs Recent Labs  Lab 10/26/22 0053  WBC 11.1*   Microbiology Recent Results (from the past 240 hour(s))  SARS Coronavirus 2 by RT PCR (hospital order, performed in [facility] hospital lab) *cepheid single result test* Anterior Nasal Swab     Status: None   Collection Time: 10/26/22 12:53 AM   Specimen: Anterior Nasal Swab  Result Value Ref Range Status   SARS Coronavirus 2 by RT PCR NEGATIVE NEGATIVE Final    Comment: (NOTE) SARS-CoV-2 target nucleic acids are NOT DETECTED.  The SARS-CoV-2 RNA is generally detectable in upper and lower respiratory specimens during the acute phase of infection. The lowest concentration  of SARS-CoV-2 viral copies this assay can detect is 250 copies / mL. A negative result does not preclude SARS-CoV-2 infection and should not be used as the sole basis for treatment or other patient management decisions.  A negative result may occur  with improper specimen collection / handling, submission of specimen other than nasopharyngeal swab, presence of viral mutation(s) within the areas targeted by this assay, and inadequate number of viral copies (<250 copies / mL). A negative result must be combined with clinical observations, patient history, and epidemiological information.  Fact Sheet for Patients:   https://www.patel.info/  Fact Sheet for Healthcare Providers: https://hall.com/  This test is not yet approved or  cleared by the [place] FDA and has been authorized for detection and/or diagnosis of SARS-CoV-2 by FDA under an Emergency Use Authorization (EUA).  This EUA will remain in effect (meaning this test can be used) for the duration of the COVID-19 declaration under Section 564(b)(1) of the Act, 21 U.S.C. section 360bbb-3(b)(1), unless the authorization is terminated or revoked sooner.  Performed at [facility], [address] [place], [place] [address]   Blood culture (routine x 2)     Status: None (Preliminary result)   Collection Time: 10/26/22  3:17 AM   Specimen: BLOOD  Result Value Ref Range Status   Specimen Description BLOOD RIGHT ARM  Final   Special Requests   Final    BOTTLES DRAWN AEROBIC AND ANAEROBIC Blood Culture results may not be optimal due to an inadequate volume of blood received in culture bottles   Culture   Final    NO GROWTH < 12 HOURS Performed at [facility], [address] [place], [place] [address]    Report Status PENDING  Incomplete  Blood culture (routine x 2)     Status: None (Preliminary result)   Collection Time: 10/26/22  3:27 AM   Specimen: BLOOD  Result Value Ref Range Status   Specimen Description BLOOD LEFT AC  Final   Special Requests   Final    BOTTLES DRAWN AEROBIC AND ANAEROBIC Blood Culture results may not be optimal due to an inadequate volume of blood received in culture bottles    Culture   Final    NO GROWTH < 12 HOURS Performed at [facility], [address] [place], [place] [address]    Report Status PENDING  Incomplete     Total time spend on discharging this patient, including the last patient exam, discussing the hospital stay, instructions for ongoing care as it relates to all pertinent caregivers, as well as preparing the medical discharge records, prescriptions, and/or referrals as applicable, is 50 minutes.    Enzo Bi, MD  [facility] 10/26/2022, 10:33 AM

## 2022-10-26 NOTE — ED Notes (Signed)
Patient set up with dinner tray.
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
## 2022-10-26 NOTE — NC FL2 (Signed)
[place] LEVEL OF CARE SCREENING TOOL     IDENTIFICATION  Patient Name: Morgan Dennis Birthdate: 1934-12-11 Sex: female Admission Date (Current Location): 10/26/2022  [facility] and [place] Number:  Engineering geologist and Address:  [facility], [address] [place], [place] [address]      Provider Number: [id number]  Attending Physician Name and Address:  Enzo Bi, MD  Relative Name and Phone Number:  Ladine, Kiper (son) [phone]    Current Level of Care: Hospital Recommended Level of Care: Assisted Living Facility Prior Approval Number:    Date Approved/Denied:   PASRR Number:    Discharge Plan: Domiciliary (Rest home) (ALF)    Current Diagnoses: Patient Active Problem List   Diagnosis Date Noted   Aspiration pneumonia ([place]) 10/26/2022   Chronic respiratory failure with hypoxia ([place]) 10/26/2022   History of pulmonary embolism 09/73/5329   Acute metabolic encephalopathy 92/42/6834   PE (pulmonary thromboembolism) ([place]) 10/19/2022   HCAP (healthcare-associated pneumonia) 10/07/2022   Acute colitis 10/07/2022   Depression with anxiety 10/07/2022   AKI (acute kidney injury) ([place]) 10/07/2022   Syncope 10/07/2022   Septic shock ([place]) 10/07/2022   Acute hypoxemic respiratory failure due to COVID-19 ([place]) 08/17/2022   Hypokalemia 08/17/2022   Essential hypertension 08/17/2022   Dyslipidemia 08/17/2022   Hearing deficit 05/04/2021   Acute respiratory failure due to COVID-19 ([place]) 12/10/2019   Acute on chronic respiratory failure with hypoxia ([place]) 10/19/2019   Thrombocytopenia ([place]) 10/19/2019   Dementia without behavioral disturbance ([place]) 10/19/2019   Aortic atherosclerosis ([place]) 10/19/2019   Right upper lobe pneumonia 10/17/2019   Sepsis ([place]) 06/16/2019   Parkinson's disease 04/23/2017   Skin cancer 02/12/2017   Chronic bronchitis ([place]) 01/02/2017   Frequency of urination and polyuria 10/12/2015   Upper GI bleed  08/18/2015   Adaptation reaction 04/30/2015   Malaise and fatigue 04/30/2015   Affective disorder, major 04/30/2015   Bad memory 04/30/2015   Mild cognitive disorder 04/30/2015   Fungal infection of toenail 04/30/2015   Arthritis, degenerative 04/30/2015   OP (osteoporosis) 04/30/2015   Arthritis or polyarthritis, rheumatoid ([place]) 04/30/2015   Personal history of other malignant neoplasm of skin 11/16/2014   Avitaminosis D 05/12/2014   Trigger finger 05/12/2014   Osteoporosis, post-menopausal 03/17/2014    Orientation RESPIRATION BLADDER Height & Weight     Self  O2 Incontinent Weight: 76.4 kg Height:     BEHAVIORAL SYMPTOMS/MOOD NEUROLOGICAL BOWEL NUTRITION STATUS      Incontinent Diet (Dysphagia 3)  AMBULATORY STATUS COMMUNICATION OF NEEDS Skin   Limited Assist Verbally Bruising                       Personal Care Assistance Level of Assistance  Bathing, Feeding, Dressing Bathing Assistance: Limited assistance Feeding assistance: Limited assistance Dressing Assistance: Limited assistance     Functional Limitations Info  Sight, Hearing, Speech Sight Info: Impaired (glasses) Hearing Info: Adequate Speech Info: Adequate    SPECIAL CARE FACTORS FREQUENCY  PT (By licensed PT), OT (By licensed OT)     PT Frequency: Eval at facility OT Frequency: eval at faciliy            Contractures Contractures Info: Not present    Additional Factors Info  Code Status, Allergies Code Status Info: DNR Allergies Info: PCN, Evista, ibandronic acid, remicade, risedronate sodium           Current Medications (10/26/2022):  This is the current hospital active  medication list Current Facility-Administered Medications  Medication Dose Route Frequency Provider Last Rate Last Admin   acetaminophen (TYLENOL) tablet 650 mg  650 mg Oral Q6H PRN Athena Masse, MD       Or   acetaminophen (TYLENOL) suppository 650 mg  650 mg Rectal Q6H PRN Athena Masse, MD        atorvastatin (LIPITOR) tablet 20 mg  20 mg Oral QHS Athena Masse, MD       carbidopa-levodopa (SINEMET CR) 50-200 MG per tablet controlled release 1 tablet  1 tablet Oral QHS Athena Masse, MD       And   entacapone (COMTAN) tablet 200 mg  200 mg Oral QHS Athena Masse, MD       carbidopa-levodopa (SINEMET IR) 25-100 MG per tablet immediate release 2 tablet  2 tablet Oral TID Athena Masse, MD   2 tablet at 10/26/22 0941   donepezil (ARICEPT) tablet 10 mg  10 mg Oral QHS Athena Masse, MD       DULoxetine (CYMBALTA) DR capsule 60 mg  60 mg Oral Daily Judd Gaudier V, MD   60 mg at 10/26/22 0940   enoxaparin (LOVENOX) injection 40 mg  40 mg Subcutaneous Q24H Judd Gaudier V, MD   40 mg at 10/26/22 0816   melatonin tablet 5 mg  5 mg Oral QHS Judd Gaudier V, MD   5 mg at 10/26/22 0430   memantine (NAMENDA) tablet 10 mg  10 mg Oral BID Athena Masse, MD   10 mg at 10/26/22 0940   metoprolol tartrate (LOPRESSOR) tablet 25 mg  25 mg Oral BID Enzo Bi, MD   25 mg at 10/26/22 1121   ondansetron (ZOFRAN) tablet 4 mg  4 mg Oral Q6H PRN Athena Masse, MD       Or   ondansetron [facility]) injection 4 mg  4 mg Intravenous Q6H PRN Athena Masse, MD       pantoprazole (PROTONIX) EC tablet 40 mg  40 mg Oral BID Enzo Bi, MD   40 mg at 10/26/22 0940   Current Outpatient Medications  Medication Sig Dispense Refill   acetaminophen (TYLENOL) 325 MG tablet Take 2 tablets (650 mg total) by mouth every 6 (six) hours as needed for mild pain (or Fever >/= 101).     aspirin 81 MG chewable tablet Chew 1 tablet (81 mg total) by mouth daily.     atorvastatin (LIPITOR) 20 MG tablet Take 20 mg by mouth at bedtime.     carbidopa-levodopa (SINEMET CR) 50-200 MG tablet Take 1 tablet by mouth at bedtime.     carbidopa-levodopa (SINEMET IR) 25-100 MG tablet Take 2 tablets by mouth 3 (three) times daily.     denosumab (PROLIA) 60 MG/ML SOSY injection Inject 60 mg into the skin every 6 (six) months.      diclofenac Sodium (VOLTAREN) 1 % GEL Apply 4 g topically 3 (three) times daily. (Apply to knees)     docusate sodium (COLACE) 100 MG capsule Take 100 mg by mouth every Monday, Wednesday, and Friday.     donepezil (ARICEPT) 10 MG tablet Take 10 mg by mouth at bedtime.     DULoxetine (CYMBALTA) 60 MG capsule Take 60 mg by mouth daily.     folic acid (FOLVITE) 992 MCG tablet Take 800 mcg by mouth daily.     HYDROcodone-acetaminophen (NORCO) 7.5-325 MG tablet Take 1 tablet by mouth in the morning and at bedtime.  lanolin/mineral oil (KERI/THERA-DERM) LOTN Apply 1 Application topically daily. (Apply to legs)     Melatonin 5 MG TABS Take 5 mg by mouth at bedtime.      memantine (NAMENDA) 10 MG tablet Take 10 mg by mouth 2 (two) times daily.     methotrexate 2.5 MG tablet Take 15 mg by mouth every Wednesday.      metoprolol tartrate (LOPRESSOR) 25 MG tablet Take 1 tablet (25 mg total) by mouth 2 (two) times daily. 60 tablet 3   Multiple Vitamin (MULTIVITAMIN WITH MINERALS) TABS tablet Take 1 tablet by mouth daily.     pregabalin (LYRICA) 150 MG capsule Take 150 mg by mouth 2 (two) times daily.     Skin Protectants, Misc. (MINERIN CREME) CREA Apply 1 Application topically daily. (Apply to legs)     solifenacin (VESICARE) 5 MG tablet Take 1 tablet (5 mg total) by mouth daily. 30 tablet 2   vitamin E 400 UNIT capsule Take 400 Units by mouth daily.     pantoprazole (PROTONIX) 40 MG tablet Take 1 tablet (40 mg total) by mouth 2 (two) times daily. 60 tablet 2   polyethylene glycol (MIRALAX) 17 g packet Take 17 g by mouth daily as needed for moderate constipation or severe constipation. 3 times a week, Monday, Wednesday and Friday       Discharge Medications:  STOP taking these medications     famotidine 20 MG tablet Commonly known as: PEPCID    nystatin 100000 UNIT/ML suspension Commonly known as: MYCOSTATIN           TAKE these medications     acetaminophen 325 MG tablet Commonly known  as: TYLENOL Take 2 tablets (650 mg total) by mouth every 6 (six) hours as needed for mild pain (or Fever >/= 101).    aspirin 81 MG chewable tablet Chew 1 tablet (81 mg total) by mouth daily.    atorvastatin 20 MG tablet Commonly known as: LIPITOR Take 20 mg by mouth at bedtime.    carbidopa-levodopa 50-200 MG tablet Commonly known as: SINEMET CR Take 1 tablet by mouth at bedtime.    carbidopa-levodopa 25-100 MG tablet Commonly known as: SINEMET IR Take 2 tablets by mouth 3 (three) times daily.    denosumab 60 MG/ML Sosy injection Commonly known as: PROLIA Inject 60 mg into the skin every 6 (six) months.    diclofenac Sodium 1 % Gel Commonly known as: VOLTAREN Apply 4 g topically 3 (three) times daily. (Apply to knees)    docusate sodium 100 MG capsule Commonly known as: COLACE Take 100 mg by mouth every Monday, Wednesday, and Friday.    donepezil 10 MG tablet Commonly known as: ARICEPT Take 10 mg by mouth at bedtime.    DULoxetine 60 MG capsule Commonly known as: CYMBALTA Take 60 mg by mouth daily.    folic acid 295 MCG tablet Commonly known as: FOLVITE Take 800 mcg by mouth daily.    HYDROcodone-acetaminophen 7.5-325 MG tablet Commonly known as: NORCO Take 1 tablet by mouth in the morning and at bedtime.    lanolin/mineral oil Lotn Apply 1 Application topically daily. (Apply to legs)    melatonin 5 MG Tabs Take 5 mg by mouth at bedtime.    memantine 10 MG tablet Commonly known as: NAMENDA Take 10 mg by mouth 2 (two) times daily.    methotrexate 2.5 MG tablet Commonly known as: RHEUMATREX Take 15 mg by mouth every Wednesday.    metoprolol tartrate 25 MG tablet  Commonly known as: LOPRESSOR Take 1 tablet (25 mg total) by mouth 2 (two) times daily.    Minerin Creme Crea Apply 1 Application topically daily. (Apply to legs)    multivitamin with minerals Tabs tablet Take 1 tablet by mouth daily.    pantoprazole 40 MG tablet Commonly known as:  PROTONIX Take 1 tablet (40 mg total) by mouth 2 (two) times daily.    polyethylene glycol 17 g packet Commonly known as: MiraLax Take 17 g by mouth daily as needed for moderate constipation or severe constipation. 3 times a week, Monday, Wednesday and Friday    pregabalin 150 MG capsule Commonly known as: LYRICA Take 150 mg by mouth 2 (two) times daily.    solifenacin 5 MG tablet Commonly known as: VESICARE Take 1 tablet (5 mg total) by mouth daily.    vitamin E 180 MG (400 UNITS) capsule Take 400 Units by mouth daily.        Relevant Imaging Results:  Relevant Lab Results:   Additional Information SS #: [place], RN

## 2022-10-26 NOTE — Care Management Obs Status (Signed)
MEDICARE OBSERVATION STATUS NOTIFICATION   Patient Details  Name: Morgan Dennis MRN: [id number] Date of Birth: May 10, 1934   Medicare Observation Status Notification Given:  Yes    Shelbie Hutching, RN 10/26/2022, 11:30 AM

## 2022-10-26 NOTE — ED Notes (Signed)
Pt changed into a gown, son at the bedside.

## 2022-10-26 NOTE — ED Notes (Signed)
Called [facility] for transport to [facility] [phone]

## 2022-10-31 LAB — CULTURE, BLOOD (ROUTINE X 2)
Culture: NO GROWTH
Culture: NO GROWTH

## 2022-11-03 DIAGNOSIS — J42 Unspecified chronic bronchitis: Secondary | ICD-10-CM | POA: Diagnosis not present

## 2022-11-08 DIAGNOSIS — I2699 Other pulmonary embolism without acute cor pulmonale: Secondary | ICD-10-CM | POA: Diagnosis not present

## 2022-11-08 DIAGNOSIS — G309 Alzheimer's disease, unspecified: Secondary | ICD-10-CM | POA: Diagnosis not present

## 2022-11-08 DIAGNOSIS — M0520 Rheumatoid vasculitis with rheumatoid arthritis of unspecified site: Secondary | ICD-10-CM | POA: Diagnosis not present

## 2022-11-08 DIAGNOSIS — M48061 Spinal stenosis, lumbar region without neurogenic claudication: Secondary | ICD-10-CM | POA: Diagnosis not present

## 2022-11-08 DIAGNOSIS — R1310 Dysphagia, unspecified: Secondary | ICD-10-CM | POA: Diagnosis not present

## 2022-11-08 DIAGNOSIS — I350 Nonrheumatic aortic (valve) stenosis: Secondary | ICD-10-CM | POA: Diagnosis not present

## 2022-11-08 DIAGNOSIS — I10 Essential (primary) hypertension: Secondary | ICD-10-CM | POA: Diagnosis not present

## 2022-11-10 DIAGNOSIS — E119 Type 2 diabetes mellitus without complications: Secondary | ICD-10-CM | POA: Diagnosis not present

## 2022-11-10 DIAGNOSIS — M810 Age-related osteoporosis without current pathological fracture: Secondary | ICD-10-CM | POA: Diagnosis not present

## 2022-11-10 DIAGNOSIS — E559 Vitamin D deficiency, unspecified: Secondary | ICD-10-CM | POA: Diagnosis not present

## 2022-11-10 DIAGNOSIS — D518 Other vitamin B12 deficiency anemias: Secondary | ICD-10-CM | POA: Diagnosis not present

## 2022-11-10 DIAGNOSIS — G309 Alzheimer's disease, unspecified: Secondary | ICD-10-CM | POA: Diagnosis not present

## 2022-11-10 DIAGNOSIS — E782 Mixed hyperlipidemia: Secondary | ICD-10-CM | POA: Diagnosis not present

## 2022-11-10 DIAGNOSIS — E038 Other specified hypothyroidism: Secondary | ICD-10-CM | POA: Diagnosis not present

## 2022-11-13 DIAGNOSIS — I10 Essential (primary) hypertension: Secondary | ICD-10-CM | POA: Diagnosis not present

## 2022-11-17 DIAGNOSIS — E038 Other specified hypothyroidism: Secondary | ICD-10-CM | POA: Diagnosis not present

## 2022-11-17 DIAGNOSIS — G309 Alzheimer's disease, unspecified: Secondary | ICD-10-CM | POA: Diagnosis not present

## 2022-11-17 DIAGNOSIS — E119 Type 2 diabetes mellitus without complications: Secondary | ICD-10-CM | POA: Diagnosis not present

## 2022-11-17 DIAGNOSIS — M0520 Rheumatoid vasculitis with rheumatoid arthritis of unspecified site: Secondary | ICD-10-CM | POA: Diagnosis not present

## 2022-11-17 DIAGNOSIS — E559 Vitamin D deficiency, unspecified: Secondary | ICD-10-CM | POA: Diagnosis not present

## 2022-11-17 DIAGNOSIS — M810 Age-related osteoporosis without current pathological fracture: Secondary | ICD-10-CM | POA: Diagnosis not present

## 2022-11-17 DIAGNOSIS — R4182 Altered mental status, unspecified: Secondary | ICD-10-CM | POA: Diagnosis not present

## 2022-11-17 DIAGNOSIS — D518 Other vitamin B12 deficiency anemias: Secondary | ICD-10-CM | POA: Diagnosis not present

## 2022-11-27 ENCOUNTER — Emergency Department
Admission: EM | Admit: 2022-11-27 | Discharge: 2022-11-27 | Disposition: A | Discharge status: Skilled Nursing Facility | Attending: Emergency Medicine | Admitting: Emergency Medicine

## 2022-11-27 ENCOUNTER — Other Ambulatory Visit: Payer: Self-pay

## 2022-11-27 ENCOUNTER — Emergency Department

## 2022-11-27 DIAGNOSIS — W182XXA Fall in (into) shower or empty bathtub, initial encounter: Secondary | ICD-10-CM | POA: Insufficient documentation

## 2022-11-27 DIAGNOSIS — W19XXXA Unspecified fall, initial encounter: Secondary | ICD-10-CM

## 2022-11-27 DIAGNOSIS — S0083XA Contusion of other part of head, initial encounter: Secondary | ICD-10-CM | POA: Insufficient documentation

## 2022-11-27 DIAGNOSIS — G319 Degenerative disease of nervous system, unspecified: Secondary | ICD-10-CM | POA: Diagnosis not present

## 2022-11-27 DIAGNOSIS — S0990XA Unspecified injury of head, initial encounter: Secondary | ICD-10-CM | POA: Diagnosis present

## 2022-11-27 DIAGNOSIS — M47812 Spondylosis without myelopathy or radiculopathy, cervical region: Secondary | ICD-10-CM | POA: Diagnosis not present

## 2022-11-27 DIAGNOSIS — S199XXA Unspecified injury of neck, initial encounter: Secondary | ICD-10-CM | POA: Diagnosis not present

## 2022-11-27 DIAGNOSIS — F0390 Unspecified dementia without behavioral disturbance: Secondary | ICD-10-CM | POA: Diagnosis not present

## 2022-11-27 DIAGNOSIS — R69 Illness, unspecified: Secondary | ICD-10-CM | POA: Diagnosis not present

## 2022-11-27 DIAGNOSIS — Y92002 Bathroom of unspecified non-institutional (private) residence single-family (private) house as the place of occurrence of the external cause: Secondary | ICD-10-CM | POA: Insufficient documentation

## 2022-11-27 NOTE — ED Notes (Signed)
Morgan Dennis updated that son is bringing patient back.

## 2022-11-27 NOTE — ED Provider Notes (Signed)
[facility] Provider Note    Event Date/Time   First MD Initiated Contact with Patient 11/27/22 0113     (approximate)   History   Fall   HPI  Morgan Dennis is a 88 y.o. female who presents to the ED for evaluation of Fall   Son brings the patient to the ED for evaluation of a fall.  She reportedly fell in the bathroom.  She is unable to provide any relevant history due to her dementia, reportedly at baseline by the son.  He acknowledges that she is on hospice, but due to the bruising on her face he was worried about an intracranial hemorrhage or other pathology similar to this and brought her in requesting imaging.  She has no complaints.  Physical Exam   Triage Vital Signs: ED Triage Vitals  Enc Vitals Group     BP 11/27/22 0112 110/60     Pulse Rate 11/27/22 0112 70     Resp 11/27/22 0112 20     Temp 11/27/22 0112 98.3 F (36.8 C)     Temp Source 11/27/22 0112 Oral     SpO2 11/27/22 0112 95 %     Weight --      Height --      Head Circumference --      Peak Flow --      Pain Score 11/27/22 0113 0     Pain Loc --      Pain Edu? --      Excl. in [place]? --     Most recent vital signs: Vitals:   11/27/22 0112  BP: 110/60  Pulse: 70  Resp: 20  Temp: 98.3 F (36.8 C)  SpO2: 95%    General: Awake, no distress.  CV:  Good peripheral perfusion.  Resp:  Normal effort.  Abd:  No distention.  MSK:  No deformity noted.  Neuro:  No focal deficits appreciated.  Other:  Bruising across the midface, primarily.  No signs of EOM entrapment, loose teeth, laceration or nasal septal hematoma.   ED Results / Procedures / Treatments   Labs (all labs ordered are listed, but only abnormal results are displayed) Labs Reviewed - No data to display  EKG   RADIOLOGY CT head interpreted by me without evidence of acute intracranial pathology CT cervical spine interpreted by me without evidence of fracture or dislocation. CT maxillofacial interpreted  by me without evidence of fracture or dislocation.  Official radiology report(s): CT HEAD WO CONTRAST (5MM)  Result Date: 11/27/2022 CLINICAL DATA:  fall, facial bruising; Neck trauma, mechanically unstable spine (Age >= 16y); fall, bruising EXAM: CT HEAD WITHOUT CONTRAST CT MAXILLOFACIAL WITHOUT CONTRAST CT CERVICAL SPINE WITHOUT CONTRAST TECHNIQUE: Multidetector CT imaging of the head, cervical spine, and maxillofacial structures were performed using the standard protocol without intravenous contrast. Multiplanar CT image reconstructions of the cervical spine and maxillofacial structures were also generated. RADIATION DOSE REDUCTION: This exam was performed according to the departmental dose-optimization program which includes automated exposure control, adjustment of the mA and/or kV according to patient size and/or use of iterative reconstruction technique. COMPARISON:  None Available. FINDINGS: CT HEAD FINDINGS Brain: Normal anatomic configuration. Parenchymal volume loss is commensurate with the patient's age. Mild periventricular white matter changes are present likely reflecting the sequela of small vessel ischemia. No abnormal intra or extra-axial mass lesion or fluid collection. No abnormal mass effect or midline shift. No evidence of acute intracranial hemorrhage or infarct. Ventricular size is normal.  Cerebellum unremarkable. Vascular: No asymmetric hyperdense vasculature at the skull base. Skull: Intact Other: Mastoid air cells and middle ear cavities are clear. CT MAXILLOFACIAL FINDINGS Osseous: No fracture or mandibular dislocation. No destructive process. Orbits: Negative. No traumatic or inflammatory finding. Sinuses: Clear. Soft tissues: Soft tissue swelling involving the nasal soft tissues. CT CERVICAL SPINE FINDINGS Alignment: Normal. Skull base and vertebrae: Craniocervical alignment is normal. The atlantodental interval is not widened. No acute fracture of the cervical spine. Vertebral  body height is preserved. Soft tissues and spinal canal: No prevertebral fluid or swelling. No visible canal hematoma. Note is made of a prominent retropharyngeal course of the carotid arteries. Disc levels: Intervertebral disc space narrowing and endplate remodeling at K2-7 and to a lesser extent, C5-6 in keeping with changes of mild to moderate degenerative disc disease. Spinal canal is widely patent. Multilevel degenerative changes result in multilevel mild-to-moderate neuroforaminal narrowing, most severe on the right at C4-5 and on the left at C5-6. Upper chest: Negative. Other: None IMPRESSION: 1. No acute intracranial injury. No calvarial fracture. 2. No acute facial fracture. Soft tissue swelling involving the nasal soft tissues. 3. No acute fracture or listhesis of the cervical spine. Electronically Signed   By: Fidela Salisbury M.D.   On: 11/27/2022 02:00   CT Cervical Spine Wo Contrast  Result Date: 11/27/2022 CLINICAL DATA:  fall, facial bruising; Neck trauma, mechanically unstable spine (Age >= 16y); fall, bruising EXAM: CT HEAD WITHOUT CONTRAST CT MAXILLOFACIAL WITHOUT CONTRAST CT CERVICAL SPINE WITHOUT CONTRAST TECHNIQUE: Multidetector CT imaging of the head, cervical spine, and maxillofacial structures were performed using the standard protocol without intravenous contrast. Multiplanar CT image reconstructions of the cervical spine and maxillofacial structures were also generated. RADIATION DOSE REDUCTION: This exam was performed according to the departmental dose-optimization program which includes automated exposure control, adjustment of the mA and/or kV according to patient size and/or use of iterative reconstruction technique. COMPARISON:  None Available. FINDINGS: CT HEAD FINDINGS Brain: Normal anatomic configuration. Parenchymal volume loss is commensurate with the patient's age. Mild periventricular white matter changes are present likely reflecting the sequela of small vessel ischemia. No  abnormal intra or extra-axial mass lesion or fluid collection. No abnormal mass effect or midline shift. No evidence of acute intracranial hemorrhage or infarct. Ventricular size is normal. Cerebellum unremarkable. Vascular: No asymmetric hyperdense vasculature at the skull base. Skull: Intact Other: Mastoid air cells and middle ear cavities are clear. CT MAXILLOFACIAL FINDINGS Osseous: No fracture or mandibular dislocation. No destructive process. Orbits: Negative. No traumatic or inflammatory finding. Sinuses: Clear. Soft tissues: Soft tissue swelling involving the nasal soft tissues. CT CERVICAL SPINE FINDINGS Alignment: Normal. Skull base and vertebrae: Craniocervical alignment is normal. The atlantodental interval is not widened. No acute fracture of the cervical spine. Vertebral body height is preserved. Soft tissues and spinal canal: No prevertebral fluid or swelling. No visible canal hematoma. Note is made of a prominent retropharyngeal course of the carotid arteries. Disc levels: Intervertebral disc space narrowing and endplate remodeling at C6-2 and to a lesser extent, C5-6 in keeping with changes of mild to moderate degenerative disc disease. Spinal canal is widely patent. Multilevel degenerative changes result in multilevel mild-to-moderate neuroforaminal narrowing, most severe on the right at C4-5 and on the left at C5-6. Upper chest: Negative. Other: None IMPRESSION: 1. No acute intracranial injury. No calvarial fracture. 2. No acute facial fracture. Soft tissue swelling involving the nasal soft tissues. 3. No acute fracture or listhesis of the cervical spine. Electronically  Signed   By: Fidela Salisbury M.D.   On: 11/27/2022 02:00   CT Maxillofacial Wo Contrast  Result Date: 11/27/2022 CLINICAL DATA:  fall, facial bruising; Neck trauma, mechanically unstable spine (Age >= 16y); fall, bruising EXAM: CT HEAD WITHOUT CONTRAST CT MAXILLOFACIAL WITHOUT CONTRAST CT CERVICAL SPINE WITHOUT CONTRAST  TECHNIQUE: Multidetector CT imaging of the head, cervical spine, and maxillofacial structures were performed using the standard protocol without intravenous contrast. Multiplanar CT image reconstructions of the cervical spine and maxillofacial structures were also generated. RADIATION DOSE REDUCTION: This exam was performed according to the departmental dose-optimization program which includes automated exposure control, adjustment of the mA and/or kV according to patient size and/or use of iterative reconstruction technique. COMPARISON:  None Available. FINDINGS: CT HEAD FINDINGS Brain: Normal anatomic configuration. Parenchymal volume loss is commensurate with the patient's age. Mild periventricular white matter changes are present likely reflecting the sequela of small vessel ischemia. No abnormal intra or extra-axial mass lesion or fluid collection. No abnormal mass effect or midline shift. No evidence of acute intracranial hemorrhage or infarct. Ventricular size is normal. Cerebellum unremarkable. Vascular: No asymmetric hyperdense vasculature at the skull base. Skull: Intact Other: Mastoid air cells and middle ear cavities are clear. CT MAXILLOFACIAL FINDINGS Osseous: No fracture or mandibular dislocation. No destructive process. Orbits: Negative. No traumatic or inflammatory finding. Sinuses: Clear. Soft tissues: Soft tissue swelling involving the nasal soft tissues. CT CERVICAL SPINE FINDINGS Alignment: Normal. Skull base and vertebrae: Craniocervical alignment is normal. The atlantodental interval is not widened. No acute fracture of the cervical spine. Vertebral body height is preserved. Soft tissues and spinal canal: No prevertebral fluid or swelling. No visible canal hematoma. Note is made of a prominent retropharyngeal course of the carotid arteries. Disc levels: Intervertebral disc space narrowing and endplate remodeling at X4-5 and to a lesser extent, C5-6 in keeping with changes of mild to moderate  degenerative disc disease. Spinal canal is widely patent. Multilevel degenerative changes result in multilevel mild-to-moderate neuroforaminal narrowing, most severe on the right at C4-5 and on the left at C5-6. Upper chest: Negative. Other: None IMPRESSION: 1. No acute intracranial injury. No calvarial fracture. 2. No acute facial fracture. Soft tissue swelling involving the nasal soft tissues. 3. No acute fracture or listhesis of the cervical spine. Electronically Signed   By: Fidela Salisbury M.D.   On: 11/27/2022 02:00    PROCEDURES and INTERVENTIONS:  Procedures  Medications - No data to display   IMPRESSION / MDM / [place] / ED COURSE  I reviewed the triage vital signs and the nursing notes.  Differential diagnosis includes, but is not limited to, ICH, skull fracture, nasal bone fracture, retrobulbar hematoma  {Patient presents with symptoms of an acute illness or injury that is potentially life-threatening.  Pleasantly disoriented 88 year old woman presents after a fall with facial bruising, negative imaging suitable for return to her facility.  Look systemically well with reassuring examination without signs of neurologic or vascular deficits.  In consultation with the son, we agreed not to pursue serum diagnostics and just obtain imaging as she is a hospice patient and he would not want other things done.  Clinical Course as of 11/27/22 0534  Mon Nov 27, 2022  0252 Reassessed.  Discussed imaging results. [DS]    Clinical Course User Index [DS] Vladimir Crofts, MD     FINAL CLINICAL IMPRESSION(S) / ED DIAGNOSES   Final diagnoses:  Fall, initial encounter  Contusion of face, initial encounter  Injury of  head, initial encounter     Rx / DC Orders   ED Discharge Orders     None        Note:  This document was prepared using Dragon voice recognition software and may include unintentional dictation errors.   Vladimir Crofts, MD 11/27/22 [phone]

## 2022-11-27 NOTE — ED Triage Notes (Signed)
Patient resident at [facility] and had unwitnessed fall. Patient with history of dementia and is on hospice. Does not take thinners. Bruising and swelling noted to nose. Patient alert, resp even, unlabored on RA.

## 2022-11-29 DIAGNOSIS — M0520 Rheumatoid vasculitis with rheumatoid arthritis of unspecified site: Secondary | ICD-10-CM | POA: Diagnosis not present

## 2022-11-29 DIAGNOSIS — R5381 Other malaise: Secondary | ICD-10-CM | POA: Diagnosis not present

## 2022-11-29 DIAGNOSIS — G309 Alzheimer's disease, unspecified: Secondary | ICD-10-CM | POA: Diagnosis not present

## 2022-11-29 DIAGNOSIS — I10 Essential (primary) hypertension: Secondary | ICD-10-CM | POA: Diagnosis not present

## 2022-11-29 DIAGNOSIS — N179 Acute kidney failure, unspecified: Secondary | ICD-10-CM | POA: Diagnosis not present

## 2022-11-29 DIAGNOSIS — I350 Nonrheumatic aortic (valve) stenosis: Secondary | ICD-10-CM | POA: Diagnosis not present

## 2022-11-29 DIAGNOSIS — I2699 Other pulmonary embolism without acute cor pulmonale: Secondary | ICD-10-CM | POA: Diagnosis not present

## 2022-11-29 DIAGNOSIS — M48061 Spinal stenosis, lumbar region without neurogenic claudication: Secondary | ICD-10-CM | POA: Diagnosis not present

## 2022-11-29 DIAGNOSIS — R69 Illness, unspecified: Secondary | ICD-10-CM | POA: Diagnosis not present

## 2022-11-29 DIAGNOSIS — R1310 Dysphagia, unspecified: Secondary | ICD-10-CM | POA: Diagnosis not present

## 2022-12-03 DIAGNOSIS — J42 Unspecified chronic bronchitis: Secondary | ICD-10-CM | POA: Diagnosis not present

## 2022-12-06 DIAGNOSIS — E038 Other specified hypothyroidism: Secondary | ICD-10-CM | POA: Diagnosis not present

## 2022-12-06 DIAGNOSIS — D518 Other vitamin B12 deficiency anemias: Secondary | ICD-10-CM | POA: Diagnosis not present

## 2022-12-06 DIAGNOSIS — E782 Mixed hyperlipidemia: Secondary | ICD-10-CM | POA: Diagnosis not present

## 2022-12-06 DIAGNOSIS — Z79899 Other long term (current) drug therapy: Secondary | ICD-10-CM | POA: Diagnosis not present

## 2022-12-13 DIAGNOSIS — I10 Essential (primary) hypertension: Secondary | ICD-10-CM | POA: Diagnosis not present

## 2022-12-15 DIAGNOSIS — L600 Ingrowing nail: Secondary | ICD-10-CM | POA: Diagnosis not present

## 2022-12-15 DIAGNOSIS — M79671 Pain in right foot: Secondary | ICD-10-CM | POA: Diagnosis not present

## 2022-12-15 DIAGNOSIS — M79672 Pain in left foot: Secondary | ICD-10-CM | POA: Diagnosis not present

## 2022-12-15 DIAGNOSIS — B351 Tinea unguium: Secondary | ICD-10-CM | POA: Diagnosis not present

## 2022-12-28 DIAGNOSIS — K5900 Constipation, unspecified: Secondary | ICD-10-CM | POA: Diagnosis not present

## 2022-12-28 DIAGNOSIS — I10 Essential (primary) hypertension: Secondary | ICD-10-CM | POA: Diagnosis not present

## 2022-12-28 DIAGNOSIS — R1310 Dysphagia, unspecified: Secondary | ICD-10-CM | POA: Diagnosis not present

## 2022-12-28 DIAGNOSIS — E559 Vitamin D deficiency, unspecified: Secondary | ICD-10-CM | POA: Diagnosis not present

## 2022-12-28 DIAGNOSIS — L853 Xerosis cutis: Secondary | ICD-10-CM | POA: Diagnosis not present

## 2022-12-28 DIAGNOSIS — R5381 Other malaise: Secondary | ICD-10-CM | POA: Diagnosis not present

## 2022-12-28 DIAGNOSIS — G4700 Insomnia, unspecified: Secondary | ICD-10-CM | POA: Diagnosis not present

## 2022-12-28 DIAGNOSIS — E785 Hyperlipidemia, unspecified: Secondary | ICD-10-CM | POA: Diagnosis not present

## 2022-12-28 DIAGNOSIS — I350 Nonrheumatic aortic (valve) stenosis: Secondary | ICD-10-CM | POA: Diagnosis not present

## 2022-12-28 DIAGNOSIS — I2699 Other pulmonary embolism without acute cor pulmonale: Secondary | ICD-10-CM | POA: Diagnosis not present

## 2023-01-03 DIAGNOSIS — J42 Unspecified chronic bronchitis: Secondary | ICD-10-CM | POA: Diagnosis not present

## 2023-01-12 DIAGNOSIS — R011 Cardiac murmur, unspecified: Secondary | ICD-10-CM | POA: Diagnosis not present

## 2023-01-19 DIAGNOSIS — G309 Alzheimer's disease, unspecified: Secondary | ICD-10-CM | POA: Diagnosis not present

## 2023-01-19 DIAGNOSIS — R1310 Dysphagia, unspecified: Secondary | ICD-10-CM | POA: Diagnosis not present

## 2023-01-19 DIAGNOSIS — I10 Essential (primary) hypertension: Secondary | ICD-10-CM | POA: Diagnosis not present

## 2023-01-19 DIAGNOSIS — R5381 Other malaise: Secondary | ICD-10-CM | POA: Diagnosis not present

## 2023-01-25 DIAGNOSIS — R4182 Altered mental status, unspecified: Secondary | ICD-10-CM | POA: Diagnosis not present

## 2023-02-03 DIAGNOSIS — J42 Unspecified chronic bronchitis: Secondary | ICD-10-CM | POA: Diagnosis not present

## 2023-02-09 DIAGNOSIS — F419 Anxiety disorder, unspecified: Secondary | ICD-10-CM | POA: Diagnosis not present

## 2023-02-09 DIAGNOSIS — F32A Depression, unspecified: Secondary | ICD-10-CM | POA: Diagnosis not present

## 2023-02-09 DIAGNOSIS — R69 Illness, unspecified: Secondary | ICD-10-CM | POA: Diagnosis not present

## 2023-02-09 DIAGNOSIS — F59 Unspecified behavioral syndromes associated with physiological disturbances and physical factors: Secondary | ICD-10-CM | POA: Diagnosis not present

## 2023-02-19 DIAGNOSIS — S5012XD Contusion of left forearm, subsequent encounter: Secondary | ICD-10-CM | POA: Diagnosis not present

## 2023-03-01 DIAGNOSIS — F59 Unspecified behavioral syndromes associated with physiological disturbances and physical factors: Secondary | ICD-10-CM | POA: Diagnosis not present

## 2023-03-01 DIAGNOSIS — F419 Anxiety disorder, unspecified: Secondary | ICD-10-CM | POA: Diagnosis not present

## 2023-03-01 DIAGNOSIS — F32A Depression, unspecified: Secondary | ICD-10-CM | POA: Diagnosis not present

## 2023-03-01 DIAGNOSIS — R69 Illness, unspecified: Secondary | ICD-10-CM | POA: Diagnosis not present

## 2023-03-04 DIAGNOSIS — J42 Unspecified chronic bronchitis: Secondary | ICD-10-CM | POA: Diagnosis not present

## 2023-03-06 DIAGNOSIS — G309 Alzheimer's disease, unspecified: Secondary | ICD-10-CM | POA: Diagnosis not present

## 2023-03-06 DIAGNOSIS — F02C3 Dementia in other diseases classified elsewhere, severe, with mood disturbance: Secondary | ICD-10-CM | POA: Diagnosis not present

## 2023-03-06 DIAGNOSIS — F419 Anxiety disorder, unspecified: Secondary | ICD-10-CM | POA: Diagnosis not present

## 2023-03-06 DIAGNOSIS — F5105 Insomnia due to other mental disorder: Secondary | ICD-10-CM | POA: Diagnosis not present

## 2023-03-06 DIAGNOSIS — F02C11 Dementia in other diseases classified elsewhere, severe, with agitation: Secondary | ICD-10-CM | POA: Diagnosis not present

## 2023-03-06 DIAGNOSIS — F02C18 Dementia in other diseases classified elsewhere, severe, with other behavioral disturbance: Secondary | ICD-10-CM | POA: Diagnosis not present

## 2023-03-06 DIAGNOSIS — G8929 Other chronic pain: Secondary | ICD-10-CM | POA: Diagnosis not present

## 2023-03-06 DIAGNOSIS — F32A Depression, unspecified: Secondary | ICD-10-CM | POA: Diagnosis not present

## 2023-03-15 ENCOUNTER — Emergency Department
Admission: EM | Admit: 2023-03-15 | Discharge: 2023-03-15 | Disposition: A | Discharge status: Home / Self Care | Attending: Emergency Medicine | Admitting: Emergency Medicine

## 2023-03-15 ENCOUNTER — Emergency Department

## 2023-03-15 ENCOUNTER — Encounter: Payer: Self-pay | Admitting: Emergency Medicine

## 2023-03-15 ENCOUNTER — Other Ambulatory Visit: Payer: Self-pay

## 2023-03-15 DIAGNOSIS — G309 Alzheimer's disease, unspecified: Secondary | ICD-10-CM | POA: Insufficient documentation

## 2023-03-15 DIAGNOSIS — W19XXXA Unspecified fall, initial encounter: Secondary | ICD-10-CM | POA: Diagnosis not present

## 2023-03-15 DIAGNOSIS — R9431 Abnormal electrocardiogram [ECG] [EKG]: Secondary | ICD-10-CM | POA: Diagnosis not present

## 2023-03-15 DIAGNOSIS — G20C Parkinsonism, unspecified: Secondary | ICD-10-CM | POA: Diagnosis not present

## 2023-03-15 DIAGNOSIS — S7012XA Contusion of left thigh, initial encounter: Secondary | ICD-10-CM | POA: Insufficient documentation

## 2023-03-15 DIAGNOSIS — S7002XA Contusion of left hip, initial encounter: Secondary | ICD-10-CM | POA: Diagnosis not present

## 2023-03-15 DIAGNOSIS — M1712 Unilateral primary osteoarthritis, left knee: Secondary | ICD-10-CM | POA: Diagnosis not present

## 2023-03-15 DIAGNOSIS — Z043 Encounter for examination and observation following other accident: Secondary | ICD-10-CM | POA: Diagnosis not present

## 2023-03-15 DIAGNOSIS — M25552 Pain in left hip: Secondary | ICD-10-CM | POA: Diagnosis not present

## 2023-03-15 DIAGNOSIS — R609 Edema, unspecified: Secondary | ICD-10-CM | POA: Diagnosis not present

## 2023-03-15 DIAGNOSIS — R0902 Hypoxemia: Secondary | ICD-10-CM | POA: Diagnosis not present

## 2023-03-15 DIAGNOSIS — S79912A Unspecified injury of left hip, initial encounter: Secondary | ICD-10-CM | POA: Diagnosis present

## 2023-03-15 DIAGNOSIS — Z743 Need for continuous supervision: Secondary | ICD-10-CM | POA: Diagnosis not present

## 2023-03-15 LAB — BASIC METABOLIC PANEL
Anion gap: 8 (ref 5–15)
BUN: 19 mg/dL (ref 8–23)
CO2: 32 mmol/L (ref 22–32)
Calcium: 8.5 mg/dL — ABNORMAL LOW (ref 8.9–10.3)
Chloride: 100 mmol/L (ref 98–111)
Creatinine, Ser: 0.81 mg/dL (ref 0.44–1.00)
GFR, Estimated: >60 mL/min (ref >60)
Glucose, Bld: 117 mg/dL — ABNORMAL HIGH (ref 70–99)
Potassium: 3.7 mmol/L (ref 3.5–5.1)
Sodium: 140 mmol/L (ref 135–145)

## 2023-03-15 LAB — CBC
HCT: 38.6 % (ref 36.0–46.0)
Hemoglobin: 11.9 g/dL — ABNORMAL LOW (ref 12.0–15.0)
MCH: 30.7 pg (ref 26.0–34.0)
MCHC: 30.8 g/dL (ref 30.0–36.0)
MCV: 99.5 fL (ref 80.0–100.0)
Platelets: 146 10*3/uL — ABNORMAL LOW (ref 150–400)
RBC: 3.88 MIL/uL (ref 3.87–5.11)
RDW: 15.4 % (ref 11.5–15.5)
WBC: 7.8 10*3/uL (ref 4.0–10.5)
nRBC: 0 % (ref 0.0–0.2)

## 2023-03-15 LAB — CK: Total CK: 109 U/L (ref 38–234)

## 2023-03-15 MED ORDER — HYDROCODONE-ACETAMINOPHEN 5-325 MG PO TABS
1.0000 | ORAL_TABLET | Freq: Once | ORAL | Status: AC
  Administered 2023-03-15: 1 via ORAL
  Filled 2023-03-15: qty 1

## 2023-03-15 NOTE — ED Triage Notes (Signed)
Pt to ER via EMS from [facility] after an unwitnessed fall this AM.  Pt does not take blood thinners.  Pt c/o left hip pain.  Pt arrives on normal home 2L [place].

## 2023-03-15 NOTE — ED Notes (Signed)
PT ambulatory with 1 assist without difficulty.

## 2023-03-15 NOTE — Discharge Instructions (Signed)
You have been seen in the Emergency Department (ED) today for a fall.  Your work up does not show any concerning injuries.   ? ?Please follow up with your doctor regarding today's Emergency Department (ED) visit and your recent fall.   ? ?Return to the ED if you have any headache, confusion, slurred speech, weakness/numbness of any arm or leg, or any increased pain. ? ?

## 2023-03-15 NOTE — ED Provider Notes (Signed)
[facility] Provider Note    Event Date/Time   First MD Initiated Contact with Patient 03/15/23 0813     (approximate)   History   Fall   HPI  Morgan Dennis is a 89 y.o. female history of Parkinson's, Alzheimer's, remote pulmonary embolism  Currently resides in memory care.  According to her son, [name], he was told the patient was found laying next to her bed this morning by tech.  He reports he doubts that she would have been on the floor very long and does have a history of falling.  EMS reports they were called for evaluation after a fall.  They noted that patient had some complaint of pain around the left hip area.  Patient herself reports no pain or discomfort at this time, except she does have some soreness around the left hip area.  She is able to demonstrate good range of motion noted though.  She is able to sit up, denies headache, able to move her knee and left hip well without deficit     Physical Exam   Triage Vital Signs: ED Triage Vitals [03/15/23 0819]  Enc Vitals Group     BP 121/86     Pulse Rate 70     Resp 18     Temp 98 F (36.7 C)     Temp src      SpO2 96 %     Weight 169 lb 12.1 oz (77 kg)     Height 5\' 4"  (1.626 m)     Head Circumference      Peak Flow      Pain Score      Pain Loc      Pain Edu?      Excl. in [place]?     Most recent vital signs: Vitals:   03/15/23 0819  BP: 121/86  Pulse: 70  Resp: 18  Temp: 98 F (36.7 C)  SpO2: 96%     General: Awake, no distress.  Patient reports since January unsure of the year.  Her son [name], at bedside, advised patient has known history of Alzheimer's and Parkinson's CV:  Good peripheral perfusion.  Normal tones and rate Resp:  Normal effort.  Clear lungs bilaterally with normal work of breathing Abd:  No distention.  Soft nontender nondistended throughout Other:  Demonstrates good range of motion of all extremities.  Some bruising noted over the left hand, evidently this  is been present after a fall few weeks ago.  Additionally her left ring finger has bruising over it that extends from the hand, and she has prominent interphalangeal joints due to rheumatoid arthritis.  She has a ring on this finger that is loose able to be turned with slight edema but normal capillary refill of the left ring finger.  After discussing and examining closely discussed with patient's son, also given the patient does not wish to have the ring removed, we will leave it in place.  Does not appear to have an acute injury today and there is no evidence of vascular compromise  Patient demonstrated good range of motion of right lower extremity through all joints without pain or deformity.  Additionally the left lower extremity she ranges well with extension and flexion to the knee hip ankle and foot but reports a slight soreness and pain with palpation over the left lateral hip.  No shortening or deformity.  Palpable lower extremity pulses bilateral  Normocephalic atraumatic.  Exam of the patient's  back with her sitting up there is no bruising or injury noted.  ED Results / Procedures / Treatments   Labs (all labs ordered are listed, but only abnormal results are displayed) Labs Reviewed  CBC - Abnormal; Notable for the following components:      Result Value   Hemoglobin 11.9 (*)    Platelets 146 (*)    All other components within normal limits  BASIC METABOLIC PANEL - Abnormal; Notable for the following components:   Glucose, Bld 117 (*)    Calcium 8.5 (*)    All other components within normal limits  CK     EKG  ED ECG REPORT I, Delman Kitten, the attending physician, personally viewed and interpreted this ECG.  Date: 03/15/2023 EKG Time: 820 Rate: 70 Rhythm: normal sinus rhythm QRS Axis: normal Intervals: normal ST/T Wave abnormalities: normal Narrative Interpretation: no evidence of acute ischemia    RADIOLOGY  Left hip x-ray interpreted by me as negative for acute  fracture  DG Knee 2 Views Left  Result Date: 03/15/2023 CLINICAL DATA:  Fall.  Soreness. EXAM: LEFT KNEE - 1-2 VIEW COMPARISON:  None Available. FINDINGS: There is tricompartmental degenerative change. There is no acute fracture or subluxation. No joint effusion. Soft tissues are unremarkable. IMPRESSION: Degenerative changes. No evidence for acute fracture. Electronically Signed   By: Nolon Nations M.D.   On: 03/15/2023 09:46   DG Hip Unilat W or Wo Pelvis 2-3 Views Left  Result Date: 03/15/2023 CLINICAL DATA:  Fall. Soreness over the LEFT hip. Good range of motion. EXAM: DG HIP (WITH OR WITHOUT PELVIS) 2-3V LEFT COMPARISON:  CT 10/07/2022 FINDINGS: There is no acute fracture or subluxation. Degenerative changes are noted in the LOWER lumbar spine. Visualized bowel gas pattern is nonobstructive. IMPRESSION: No evidence for acute abnormality. Electronically Signed   By: Nolon Nations M.D.   On: 03/15/2023 09:45      PROCEDURES:  Critical Care performed: No  Procedures   MEDICATIONS ORDERED IN ED: Medications  HYDROcodone-acetaminophen (NORCO/VICODIN) 5-325 MG per tablet 1 tablet (1 tablet Oral Given 03/15/23 0851)     IMPRESSION / MDM / ASSESSMENT AND PLAN / ED COURSE  I reviewed the triage vital signs and the nursing notes.                              Differential diagnosis includes, but is not limited to, injuries from fall, weakness, pre-existing illness arrhythmia etc.  Discussed with patient's son, [name], and also AJ.  He is voiced that they are pursuing primarily palliative measures for their mother.  After discussing risks and benefits of CT scan of the head, shared medical decision we will forego imaging.  They reports her father died from an intracranial hemorrhage, but even if there mother had such type of diagnosis they would not like to have any emergent management or surgery for her.  They are comfortable with the plan to obtain imaging of the left hip, as well as  basic labs.  But overall primarily palliative and comfort are her goals of care.  Called [facility], rings, goes to hold music, no answer or voicemail after waiting.   Patient's presentation is most consistent with acute complicated illness / injury requiring diagnostic workup.    After reviewing imaging which is negative for acute finding.  Also reviewing labs which did not show evidence of acute abnormality and receiving medication for pain relief, the patient is  able to get up with assistance and ambulate without difficulty.  Discussed with the patient's son at the bedside, very comfortable with the plan with the patient's goals of care to return her to her primary care at this time.  The patient in no distress, appears pain well-controlled, and has as needed prescription for pain medication already provided to her from her outside physician.  Return precautions and treatment recommendations and follow-up discussed with the patient's son who is agreeable with the plan.        FINAL CLINICAL IMPRESSION(S) / ED DIAGNOSES   Final diagnoses:  Fall, initial encounter  Contusion of left hip and thigh, initial encounter     Rx / DC Orders   ED Discharge Orders     None        Note:  This document was prepared using Dragon voice recognition software and may include unintentional dictation errors.   Delman Kitten, MD 03/16/23 1137

## 2023-03-19 ENCOUNTER — Telehealth: Payer: Self-pay

## 2023-03-19 NOTE — Telephone Encounter (Signed)
        Patient  visited [facility] on 3/28    Telephone encounter attempt :  1st  A HIPAA compliant voice message was left requesting a return call.  Instructed patient to call back    [facility] [phone] [address], [place], [place] [address] Phone: [phone] Email: [email or web address]

## 2023-03-20 ENCOUNTER — Telehealth: Payer: Self-pay

## 2023-03-20 NOTE — Telephone Encounter (Signed)
     Patient  visit on 3/28  at [facility]    Have you been able to follow up with your primary care physician? Yes   The patient was or was not able to obtain any needed medicine or equipment.yes   Are there diet recommendations that you are having difficulty following? Na   Patient expresses understanding of discharge instructions and education provided has no other needs at this time.  Yes     [facility] [phone] [address], [place],  [address] Phone: [phone] Email: [email or web address]

## 2023-03-21 ENCOUNTER — Emergency Department
Admission: EM | Admit: 2023-03-21 | Discharge: 2023-03-22 | Disposition: A | Discharge status: Skilled Nursing Facility | Attending: Emergency Medicine | Admitting: Emergency Medicine

## 2023-03-21 ENCOUNTER — Emergency Department

## 2023-03-21 ENCOUNTER — Encounter: Payer: Self-pay | Admitting: Emergency Medicine

## 2023-03-21 DIAGNOSIS — S79911A Unspecified injury of right hip, initial encounter: Secondary | ICD-10-CM | POA: Diagnosis not present

## 2023-03-21 DIAGNOSIS — S022XXA Fracture of nasal bones, initial encounter for closed fracture: Secondary | ICD-10-CM | POA: Diagnosis not present

## 2023-03-21 DIAGNOSIS — S0992XA Unspecified injury of nose, initial encounter: Secondary | ICD-10-CM | POA: Diagnosis present

## 2023-03-21 DIAGNOSIS — F0390 Unspecified dementia without behavioral disturbance: Secondary | ICD-10-CM | POA: Insufficient documentation

## 2023-03-21 DIAGNOSIS — R0902 Hypoxemia: Secondary | ICD-10-CM | POA: Diagnosis not present

## 2023-03-21 DIAGNOSIS — S8991XA Unspecified injury of right lower leg, initial encounter: Secondary | ICD-10-CM | POA: Diagnosis not present

## 2023-03-21 DIAGNOSIS — S0083XA Contusion of other part of head, initial encounter: Secondary | ICD-10-CM | POA: Diagnosis not present

## 2023-03-21 DIAGNOSIS — W19XXXA Unspecified fall, initial encounter: Secondary | ICD-10-CM | POA: Diagnosis not present

## 2023-03-21 DIAGNOSIS — Z743 Need for continuous supervision: Secondary | ICD-10-CM | POA: Diagnosis not present

## 2023-03-21 DIAGNOSIS — R69 Illness, unspecified: Secondary | ICD-10-CM | POA: Diagnosis not present

## 2023-03-21 DIAGNOSIS — R531 Weakness: Secondary | ICD-10-CM | POA: Diagnosis not present

## 2023-03-21 NOTE — ED Notes (Signed)
Patient transported to X-ray 

## 2023-03-21 NOTE — ED Triage Notes (Signed)
Pt arrived via [facility] from [facility] post unwitnessed fall resulting in deformity to bridge of nose with abrasion above. Bilateral bruising to eyes. Pt c/o left hip pain on palpitations but pt was able to stand and pivot on scene. Hx/o parkinson's dementia, dysphagia and incontinence.   Pts son in route to ED per facility.  [facility] NP called to provide report on pt to writer. **Pt frequent fall's

## 2023-03-21 NOTE — ED Provider Notes (Signed)
[facility] Provider Note    Event Date/Time   First MD Initiated Contact with Patient 03/21/23 2257     (approximate)   History   Fall   HPI Level 5 caveat:  history/ROS limited by chronic dementia  Morgan Dennis is a 89 y.o. female with a history of Parkinson's disease and resulting dementia.  She was brought by EMS from her memory care unit facility for evaluation after an unwitnessed fall.  Her son is at bedside and corroborating the patient's history.  She has a history of frequent falls and is DNR with her paperwork at bedside.  The circumstances of the fall are unknown but it was thought that she was ambulating with her walker and fell forward, striking her face (the bridge of her nose) on the walker.  She has some abrasions on her face and swelling of the middle of the face, dried blood from both nares, and ecchymosis around her eyes.  The patient is on 2 L of oxygen by nasal cannula at baseline.  She says she feels fine and is not sure why "they" brought her appear.  She says she has no pain and no difficulty breathing.     Physical Exam   ED Triage Vitals  Enc Vitals Group     BP 03/22/23 0025 (!) 134/59     Pulse Rate 03/22/23 0025 74     Resp 03/22/23 0025 18     Temp 03/22/23 0025 99 F (37.2 C)     Temp Source 03/22/23 0025 Oral     SpO2 03/22/23 0025 94 %     Weight 03/21/23 2242 77 kg (169 lb 12.1 oz)     Height 03/21/23 2242 1.626 m (5\' 4" )     Head Circumference --      Peak Flow --      Pain Score 03/22/23 0133 0     Pain Loc --      Pain Edu? --      Excl. in [place]? --      Most recent vital signs: Vitals:   03/22/23 0025 03/22/23 0030  BP: (!) 134/59 129/67  Pulse: 74 73  Resp: 18 18  Temp: 99 F (37.2 C) 98.7 F (37.1 C)  SpO2: 94% 99%     General: Awake, no distress.  Obvious facial trauma. HEENT: Swollen nose, ecchymosis beneath both eyes, multiple abrasions on nose and forehead.  No hemotympanum.  Extraocular  motion is intact with no evidence of entrapment.  No chemosis, no conjunctival injection or hemorrhage, no hyphema. CV:  Good peripheral perfusion.  Regular rate and rhythm. Resp:  Normal effort. Speaking easily and comfortably, no accessory muscle usage nor intercostal retractions.  Lungs are clear to auscultation. Abd:  No distention.  Other:  Patient has no evidence of extremity injury.  She is able to actively move her arms and her legs and has no pain with passive range of motion of shoulders, elbows, wrists, hips, and knees.   ED Results / Procedures / Treatments   Labs (all labs ordered are listed, but only abnormal results are displayed) Labs Reviewed - No data to display    RADIOLOGY I viewed and interpreted the nasal bone x-rays and x-rays of the patient's hip/pelvis and right knee.  No evidence of fracture or dislocation except for a tip of the nasal bone.    PROCEDURES:  Critical Care performed: No  Procedures   MEDICATIONS ORDERED IN ED: Medications - No  data to display    IMPRESSION / MDM / [facility] / ED COURSE  I reviewed the triage vital signs and the nursing notes.                              Differential diagnosis includes, but is not limited to, fracture, contusion, orbital fracture with entrapment, intracranial bleed, cervical spine injury.  Patient's presentation is most consistent with acute presentation with potential threat to life or bodily function.  Labs/studies ordered: Nasal bone x-rays, pelvis and right hip x-rays, right knee x-rays  I had an extensive conversation with her son who is also her healthcare power of attorney.  He assured me that they have had extensive conversations in the past about goals of care and the patient is a DNR.  She had a quite severe illness not too long ago when they decided to make her DNR.  We had a long conversation about benefits of doing an extensive workup with either CT scans of the head, face, and  neck, or whether additional lab work is necessary.  Ultimately we both are in agreement that even if we find an acute or severe injury as result of her fall, he would not want to proceed with aggressive intervention, such as neurosurgery or even ENT surgery.  I think this is very reasonable and appropriate.  The patient is in no distress currently and is unlikely to need a therapeutic intervention.  Given that she had a little bit of pain on initial presentation to her right knee and right hip, I will evaluate with radiographs because if she has a subtle fracture she could benefit from pain relief if she requires orthopedic intervention.  The patient's son agrees with this plan.  We will not proceed with CT imaging.  The patient is generally happy and looking forward to going back to her facility.     Clinical Course as of 03/22/23 0811  Thu Mar 22, 2023  0039 I viewed and interpreted the x-rays as documented above.  There is no evidence of acute fracture except for a small nasal bone fracture.  No air-fluid levels in the sinuses, no indication for antibiotics.  I updated the patient and her son.  We discussed the plan again and he is very comfortable with her being discharged back to her facility.  We will work on discharge and she will need transportation given her limited mobility at baseline and her oxygen requirement. [CF]    Clinical Course User Index [CF] Hinda Kehr, MD     FINAL CLINICAL IMPRESSION(S) / ED DIAGNOSES   Final diagnoses:  Fall, initial encounter  Facial contusion, initial encounter  Closed fracture of nasal bone, initial encounter     Rx / DC Orders   ED Discharge Orders     None        Note:  This document was prepared using Dragon voice recognition software and may include unintentional dictation errors.   Hinda Kehr, MD 03/22/23 [phone]

## 2023-03-22 DIAGNOSIS — S8991XA Unspecified injury of right lower leg, initial encounter: Secondary | ICD-10-CM | POA: Diagnosis not present

## 2023-03-22 DIAGNOSIS — S79911A Unspecified injury of right hip, initial encounter: Secondary | ICD-10-CM | POA: Diagnosis not present

## 2023-03-22 MED ORDER — BACITRACIN ZINC 500 UNIT/GM EX OINT: TOPICAL_OINTMENT | CUTANEOUS | Status: DC

## 2023-03-22 NOTE — Discharge Instructions (Signed)
Although Morgan Dennis has a great deal of bruising and swelling to her face, she has only a minimal fracture to the tip of her nasal bone.  No medications or additional treatments are required.  Normal wound care to her facial abrasions as appropriate, with twice daily gentle cleaning and application of bacitracin, or otherwise directed by her regular providers.  Please have her follow-up with her primary care doctor at the next available opportunity.

## 2023-03-22 NOTE — ED Notes (Signed)
Called [facility] for transport back to [facility]  

## 2023-03-22 NOTE — ED Notes (Signed)
Patient's face/wounds cleansed with normal saline.

## 2023-04-02 DIAGNOSIS — G8929 Other chronic pain: Secondary | ICD-10-CM | POA: Diagnosis not present

## 2023-04-02 DIAGNOSIS — R69 Illness, unspecified: Secondary | ICD-10-CM | POA: Diagnosis not present

## 2023-04-02 DIAGNOSIS — G309 Alzheimer's disease, unspecified: Secondary | ICD-10-CM | POA: Diagnosis not present

## 2023-04-04 DIAGNOSIS — J42 Unspecified chronic bronchitis: Secondary | ICD-10-CM | POA: Diagnosis not present

## 2023-04-09 DIAGNOSIS — F32A Depression, unspecified: Secondary | ICD-10-CM | POA: Diagnosis not present

## 2023-04-09 DIAGNOSIS — F419 Anxiety disorder, unspecified: Secondary | ICD-10-CM | POA: Diagnosis not present

## 2023-05-03 DIAGNOSIS — G8929 Other chronic pain: Secondary | ICD-10-CM | POA: Diagnosis not present

## 2023-05-03 DIAGNOSIS — G20A1 Parkinson's disease without dyskinesia, without mention of fluctuations: Secondary | ICD-10-CM | POA: Diagnosis not present

## 2023-05-03 DIAGNOSIS — F419 Anxiety disorder, unspecified: Secondary | ICD-10-CM | POA: Diagnosis not present

## 2023-05-03 DIAGNOSIS — Z79899 Other long term (current) drug therapy: Secondary | ICD-10-CM | POA: Diagnosis not present

## 2023-05-03 DIAGNOSIS — G309 Alzheimer's disease, unspecified: Secondary | ICD-10-CM | POA: Diagnosis not present

## 2023-05-03 DIAGNOSIS — F5105 Insomnia due to other mental disorder: Secondary | ICD-10-CM | POA: Diagnosis not present

## 2023-05-04 DIAGNOSIS — J42 Unspecified chronic bronchitis: Secondary | ICD-10-CM | POA: Diagnosis not present

## 2023-06-02 DIAGNOSIS — R059 Cough, unspecified: Secondary | ICD-10-CM | POA: Diagnosis not present

## 2023-06-04 DIAGNOSIS — J42 Unspecified chronic bronchitis: Secondary | ICD-10-CM | POA: Diagnosis not present

## 2023-06-06 DIAGNOSIS — R059 Cough, unspecified: Secondary | ICD-10-CM | POA: Diagnosis not present

## 2023-06-19 ENCOUNTER — Other Ambulatory Visit: Payer: Self-pay

## 2023-06-19 ENCOUNTER — Emergency Department

## 2023-06-19 DIAGNOSIS — Z7982 Long term (current) use of aspirin: Secondary | ICD-10-CM | POA: Diagnosis not present

## 2023-06-19 DIAGNOSIS — I6782 Cerebral ischemia: Secondary | ICD-10-CM | POA: Diagnosis not present

## 2023-06-19 DIAGNOSIS — Z743 Need for continuous supervision: Secondary | ICD-10-CM | POA: Diagnosis not present

## 2023-06-19 DIAGNOSIS — Z79899 Other long term (current) drug therapy: Secondary | ICD-10-CM | POA: Diagnosis not present

## 2023-06-19 DIAGNOSIS — W19XXXA Unspecified fall, initial encounter: Secondary | ICD-10-CM | POA: Insufficient documentation

## 2023-06-19 DIAGNOSIS — F0390 Unspecified dementia without behavioral disturbance: Secondary | ICD-10-CM | POA: Diagnosis not present

## 2023-06-19 DIAGNOSIS — S0101XA Laceration without foreign body of scalp, initial encounter: Secondary | ICD-10-CM | POA: Insufficient documentation

## 2023-06-19 DIAGNOSIS — I672 Cerebral atherosclerosis: Secondary | ICD-10-CM | POA: Diagnosis not present

## 2023-06-19 DIAGNOSIS — I10 Essential (primary) hypertension: Secondary | ICD-10-CM | POA: Insufficient documentation

## 2023-06-19 DIAGNOSIS — Z043 Encounter for examination and observation following other accident: Secondary | ICD-10-CM | POA: Diagnosis not present

## 2023-06-19 DIAGNOSIS — G20A1 Parkinson's disease without dyskinesia, without mention of fluctuations: Secondary | ICD-10-CM | POA: Insufficient documentation

## 2023-06-19 DIAGNOSIS — R58 Hemorrhage, not elsewhere classified: Secondary | ICD-10-CM | POA: Diagnosis not present

## 2023-06-19 DIAGNOSIS — G319 Degenerative disease of nervous system, unspecified: Secondary | ICD-10-CM | POA: Diagnosis not present

## 2023-06-19 LAB — CBC WITH DIFFERENTIAL/PLATELET
Abs Immature Granulocytes: 0.03 10*3/uL (ref 0.00–0.07)
Basophils Absolute: 0.1 10*3/uL (ref 0.0–0.1)
Basophils Relative: 1 %
Eosinophils Absolute: 0.5 10*3/uL (ref 0.0–0.5)
Eosinophils Relative: 7 %
HCT: 37.6 % (ref 36.0–46.0)
Hemoglobin: 12 g/dL (ref 12.0–15.0)
Immature Granulocytes: 1 %
Lymphocytes Relative: 27 %
Lymphs Abs: 1.7 10*3/uL (ref 0.7–4.0)
MCH: 32.3 pg (ref 26.0–34.0)
MCHC: 31.9 g/dL (ref 30.0–36.0)
MCV: 101.1 fL — ABNORMAL HIGH (ref 80.0–100.0)
Monocytes Absolute: 0.9 10*3/uL (ref 0.1–1.0)
Monocytes Relative: 14 %
Neutro Abs: 3.2 10*3/uL (ref 1.7–7.7)
Neutrophils Relative %: 50 %
Platelets: 124 10*3/uL — ABNORMAL LOW (ref 150–400)
RBC: 3.72 MIL/uL — ABNORMAL LOW (ref 3.87–5.11)
RDW: 14.5 % (ref 11.5–15.5)
WBC: 6.4 10*3/uL (ref 4.0–10.5)
nRBC: 0 % (ref 0.0–0.2)

## 2023-06-19 LAB — COMPREHENSIVE METABOLIC PANEL
ALT: 5 U/L (ref 0–44)
AST: 32 U/L (ref 15–41)
Albumin: 3.8 g/dL (ref 3.5–5.0)
Alkaline Phosphatase: 98 U/L (ref 38–126)
Anion gap: 9 (ref 5–15)
BUN: 13 mg/dL (ref 8–23)
CO2: 32 mmol/L (ref 22–32)
Calcium: 8.5 mg/dL — ABNORMAL LOW (ref 8.9–10.3)
Chloride: 98 mmol/L (ref 98–111)
Creatinine, Ser: 0.84 mg/dL (ref 0.44–1.00)
GFR, Estimated: >60 mL/min (ref >60)
Glucose, Bld: 141 mg/dL — ABNORMAL HIGH (ref 70–99)
Potassium: 3.3 mmol/L — ABNORMAL LOW (ref 3.5–5.1)
Sodium: 139 mmol/L (ref 135–145)
Total Bilirubin: 0.8 mg/dL (ref 0.3–1.2)
Total Protein: 6.6 g/dL (ref 6.5–8.1)

## 2023-06-19 NOTE — ED Notes (Signed)
First nurse note-pt brought in via ems from [facility].  Pt fell and struck unknown object.  No loc.  No blood thinners.  Pt has a laceration to back of head.  Bp 115/65 p-70, oxygen sats 100% room air per ems.   Pt in recliner in lobby.  Pt alert.

## 2023-06-19 NOTE — ED Triage Notes (Signed)
Pt presents to ER via ems from [facility] with c/o unwitnessed fall tonight.  Per son present, pt has hx of dementia, and was likely trying to get to restroom when she fell.  Pt is at her baseline mental status per her son.  Pt does have lac to posterior head with bleeding controlled at this time.  Pt is otherwise alert and in NAD at this time.  Pt not taking any blood thinners per son.  Pt is on 2L O2 via [place] at all times.

## 2023-06-20 ENCOUNTER — Emergency Department
Admission: EM | Admit: 2023-06-20 | Discharge: 2023-06-20 | Disposition: A | Discharge status: Home / Self Care | Attending: Emergency Medicine | Admitting: Emergency Medicine

## 2023-06-20 ENCOUNTER — Emergency Department

## 2023-06-20 DIAGNOSIS — Z741 Need for assistance with personal care: Secondary | ICD-10-CM | POA: Diagnosis not present

## 2023-06-20 DIAGNOSIS — I672 Cerebral atherosclerosis: Secondary | ICD-10-CM | POA: Diagnosis not present

## 2023-06-20 DIAGNOSIS — W19XXXA Unspecified fall, initial encounter: Secondary | ICD-10-CM

## 2023-06-20 DIAGNOSIS — R69 Illness, unspecified: Secondary | ICD-10-CM | POA: Diagnosis not present

## 2023-06-20 DIAGNOSIS — Z743 Need for continuous supervision: Secondary | ICD-10-CM | POA: Diagnosis not present

## 2023-06-20 DIAGNOSIS — G319 Degenerative disease of nervous system, unspecified: Secondary | ICD-10-CM | POA: Diagnosis not present

## 2023-06-20 DIAGNOSIS — S0101XA Laceration without foreign body of scalp, initial encounter: Secondary | ICD-10-CM

## 2023-06-20 DIAGNOSIS — Z043 Encounter for examination and observation following other accident: Secondary | ICD-10-CM | POA: Diagnosis not present
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
## 2023-06-20 NOTE — Discharge Instructions (Signed)
Staple removal in 7-10 days. Return to the ER for worsening symptoms, persistent vomiting, lethargy or other concerns. 

## 2023-06-26 ENCOUNTER — Telehealth: Payer: Self-pay

## 2023-06-26 NOTE — Telephone Encounter (Signed)
Transition Care Management Unsuccessful Follow-up Telephone Call  Date of discharge and from where:  [place] 7/3  Attempts:  1st Attempt  Reason for unsuccessful TCM follow-up call:  No answer/busy   Laqueisha [facility] Guide, [place] [phone] [address], [place], [place] [address] Phone: [phone] Email: [email or web address]       

## 2023-06-27 ENCOUNTER — Telehealth: Payer: Self-pay

## 2023-06-27 NOTE — Telephone Encounter (Signed)
Transition Care Management Unsuccessful Follow-up Telephone Call  Date of discharge and from where:  [place] 7/3  Attempts:  2nd Attempt  Reason for unsuccessful TCM follow-up call:  No answer/busy   Lenard Forth [facility] Guide, [facility] [phone] [address] [place], [place], [place] [address] Phone: [phone] Email: [email or web address]

## 2023-06-28 DIAGNOSIS — I7091 Generalized atherosclerosis: Secondary | ICD-10-CM | POA: Diagnosis not present

## 2023-07-04 DIAGNOSIS — J42 Unspecified chronic bronchitis: Secondary | ICD-10-CM | POA: Diagnosis not present

## 2023-07-19 ENCOUNTER — Telehealth: Payer: Self-pay

## 2023-07-19 NOTE — Telephone Encounter (Signed)
LVM for patient to call back [phone], or to call PCP office to schedule follow up apt. AS, CMA  

## 2023-08-04 DIAGNOSIS — J42 Unspecified chronic bronchitis: Secondary | ICD-10-CM | POA: Diagnosis not present

## 2023-08-18 DIAGNOSIS — N390 Urinary tract infection, site not specified: Secondary | ICD-10-CM | POA: Diagnosis not present

## 2023-09-04 DIAGNOSIS — J42 Unspecified chronic bronchitis: Secondary | ICD-10-CM | POA: Diagnosis not present

## 2023-10-10 ENCOUNTER — Emergency Department
Admission: EM | Admit: 2023-10-10 | Discharge: 2023-10-10 | Disposition: A | Discharge status: Home / Self Care | Attending: Emergency Medicine | Admitting: Emergency Medicine

## 2023-10-10 ENCOUNTER — Emergency Department

## 2023-10-10 ENCOUNTER — Other Ambulatory Visit: Payer: Self-pay

## 2023-10-10 DIAGNOSIS — S52611A Displaced fracture of right ulna styloid process, initial encounter for closed fracture: Secondary | ICD-10-CM | POA: Diagnosis not present

## 2023-10-10 DIAGNOSIS — S0033XA Contusion of nose, initial encounter: Secondary | ICD-10-CM | POA: Insufficient documentation

## 2023-10-10 DIAGNOSIS — S52501A Unspecified fracture of the lower end of right radius, initial encounter for closed fracture: Secondary | ICD-10-CM | POA: Diagnosis not present

## 2023-10-10 DIAGNOSIS — W19XXXA Unspecified fall, initial encounter: Secondary | ICD-10-CM | POA: Diagnosis not present

## 2023-10-10 DIAGNOSIS — Y9289 Other specified places as the place of occurrence of the external cause: Secondary | ICD-10-CM | POA: Diagnosis not present

## 2023-10-10 DIAGNOSIS — F0390 Unspecified dementia without behavioral disturbance: Secondary | ICD-10-CM | POA: Diagnosis not present

## 2023-10-10 DIAGNOSIS — S0083XA Contusion of other part of head, initial encounter: Secondary | ICD-10-CM | POA: Insufficient documentation

## 2023-10-10 DIAGNOSIS — Z79899 Other long term (current) drug therapy: Secondary | ICD-10-CM | POA: Diagnosis not present

## 2023-10-10 DIAGNOSIS — S0990XA Unspecified injury of head, initial encounter: Secondary | ICD-10-CM

## 2023-10-10 DIAGNOSIS — G20A1 Parkinson's disease without dyskinesia, without mention of fluctuations: Secondary | ICD-10-CM | POA: Diagnosis not present

## 2023-10-10 DIAGNOSIS — Z96652 Presence of left artificial knee joint: Secondary | ICD-10-CM | POA: Insufficient documentation

## 2023-10-10 DIAGNOSIS — S6991XA Unspecified injury of right wrist, hand and finger(s), initial encounter: Secondary | ICD-10-CM | POA: Diagnosis present

## 2023-10-10 DIAGNOSIS — Z7982 Long term (current) use of aspirin: Secondary | ICD-10-CM | POA: Insufficient documentation

## 2023-10-10 DIAGNOSIS — S62101A Fracture of unspecified carpal bone, right wrist, initial encounter for closed fracture: Secondary | ICD-10-CM

## 2023-10-10 MED ORDER — HYDROCODONE-ACETAMINOPHEN 5-325 MG PO TABS
1.0000 | ORAL_TABLET | Freq: Once | ORAL | Status: AC
  Administered 2023-10-10: 1 via ORAL
  Filled 2023-10-10: qty 1

## 2023-10-10 MED ORDER — BACITRACIN ZINC 500 UNIT/GM EX OINT
TOPICAL_OINTMENT | Freq: Once | CUTANEOUS | Status: AC
  Administered 2023-10-10: 1 via TOPICAL
  Filled 2023-10-10: qty 0.9

## 2023-10-10 NOTE — Discharge Instructions (Signed)
Please continue your hydrocodone as prescribed for pain control.

## 2023-10-10 NOTE — ED Triage Notes (Addendum)
Fall laceration on midline forehead,  swelling noted to upper bridge of nose, right arm pain, fall at 2200 on 10/09/23
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
## 2023-10-19 DEATH — deceased
